# Patient Record
Sex: Female | Born: 1950
Health system: Southern US, Community
[De-identification: ages and names within clinical notes are randomized; demographics above are authoritative.]

## PROBLEM LIST (undated history)

## (undated) DIAGNOSIS — I6522 Occlusion and stenosis of left carotid artery: Secondary | ICD-10-CM

## (undated) DIAGNOSIS — J8410 Pulmonary fibrosis, unspecified: Secondary | ICD-10-CM

## (undated) DIAGNOSIS — I639 Cerebral infarction, unspecified: Secondary | ICD-10-CM

## (undated) DIAGNOSIS — D151 Benign neoplasm of heart: Secondary | ICD-10-CM

## (undated) DIAGNOSIS — IMO0001 Reserved for inherently not codable concepts without codable children: Secondary | ICD-10-CM

## (undated) DIAGNOSIS — I10 Essential (primary) hypertension: Secondary | ICD-10-CM

## (undated) DIAGNOSIS — E785 Hyperlipidemia, unspecified: Secondary | ICD-10-CM

## (undated) HISTORY — DX: Hyperlipidemia, unspecified: E78.5

## (undated) HISTORY — DX: Pulmonary fibrosis, unspecified: J84.10

## (undated) HISTORY — DX: Essential (primary) hypertension: I10

---

## 2005-07-22 ENCOUNTER — Other Ambulatory Visit: Admission: RE | Admit: 2005-07-22 | Discharge: 2005-07-22 | Payer: Self-pay | Admitting: Obstetrics and Gynecology

## 2005-08-13 ENCOUNTER — Ambulatory Visit (HOSPITAL_COMMUNITY): Admission: RE | Admit: 2005-08-13 | Discharge: 2005-08-13 | Payer: Self-pay | Admitting: Obstetrics and Gynecology

## 2006-09-03 ENCOUNTER — Encounter: Admission: RE | Admit: 2006-09-03 | Discharge: 2006-09-03 | Payer: Self-pay | Admitting: Obstetrics and Gynecology

## 2007-09-07 ENCOUNTER — Ambulatory Visit (HOSPITAL_COMMUNITY): Admission: RE | Admit: 2007-09-07 | Discharge: 2007-09-07 | Payer: Self-pay | Admitting: Obstetrics and Gynecology

## 2008-09-08 ENCOUNTER — Ambulatory Visit (HOSPITAL_COMMUNITY): Admission: RE | Admit: 2008-09-08 | Discharge: 2008-09-08 | Payer: Self-pay | Admitting: Obstetrics and Gynecology

## 2009-07-06 ENCOUNTER — Encounter: Admission: RE | Admit: 2009-07-06 | Discharge: 2009-07-06 | Payer: Self-pay | Admitting: Obstetrics and Gynecology

## 2009-09-13 ENCOUNTER — Encounter: Admission: RE | Admit: 2009-09-13 | Discharge: 2009-09-13 | Payer: Self-pay | Admitting: Obstetrics and Gynecology

## 2011-03-26 ENCOUNTER — Other Ambulatory Visit (HOSPITAL_COMMUNITY): Payer: Self-pay | Admitting: Obstetrics and Gynecology

## 2011-03-26 DIAGNOSIS — Z1231 Encounter for screening mammogram for malignant neoplasm of breast: Secondary | ICD-10-CM

## 2011-04-08 ENCOUNTER — Ambulatory Visit (HOSPITAL_COMMUNITY)
Admission: RE | Admit: 2011-04-08 | Discharge: 2011-04-08 | Disposition: A | Discharge status: Home / Self Care | Payer: BC Managed Care – PPO | Source: Ambulatory Visit | Attending: Obstetrics and Gynecology | Admitting: Obstetrics and Gynecology

## 2011-04-08 DIAGNOSIS — Z1231 Encounter for screening mammogram for malignant neoplasm of breast: Secondary | ICD-10-CM

## 2011-12-14 ENCOUNTER — Inpatient Hospital Stay (HOSPITAL_COMMUNITY)
Admission: EM | Admit: 2011-12-14 | Discharge: 2012-01-02 | DRG: 550 | Disposition: A | Discharge status: Rehabilitation Facility | Payer: BC Managed Care – PPO | Attending: Thoracic Surgery (Cardiothoracic Vascular Surgery) | Admitting: Thoracic Surgery (Cardiothoracic Vascular Surgery)

## 2011-12-14 ENCOUNTER — Inpatient Hospital Stay (HOSPITAL_COMMUNITY): Payer: BC Managed Care – PPO

## 2011-12-14 ENCOUNTER — Encounter (HOSPITAL_COMMUNITY): Payer: Self-pay | Admitting: Emergency Medicine

## 2011-12-14 ENCOUNTER — Emergency Department (HOSPITAL_COMMUNITY): Payer: BC Managed Care – PPO

## 2011-12-14 DIAGNOSIS — Z87891 Personal history of nicotine dependence: Secondary | ICD-10-CM

## 2011-12-14 DIAGNOSIS — I63239 Cerebral infarction due to unspecified occlusion or stenosis of unspecified carotid arteries: Secondary | ICD-10-CM | POA: Diagnosis present

## 2011-12-14 DIAGNOSIS — R4701 Aphasia: Secondary | ICD-10-CM | POA: Diagnosis present

## 2011-12-14 DIAGNOSIS — I959 Hypotension, unspecified: Secondary | ICD-10-CM | POA: Diagnosis not present

## 2011-12-14 DIAGNOSIS — G9340 Encephalopathy, unspecified: Secondary | ICD-10-CM

## 2011-12-14 DIAGNOSIS — D649 Anemia, unspecified: Secondary | ICD-10-CM | POA: Diagnosis not present

## 2011-12-14 DIAGNOSIS — E876 Hypokalemia: Secondary | ICD-10-CM

## 2011-12-14 DIAGNOSIS — Z8673 Personal history of transient ischemic attack (TIA), and cerebral infarction without residual deficits: Secondary | ICD-10-CM | POA: Diagnosis present

## 2011-12-14 DIAGNOSIS — E669 Obesity, unspecified: Secondary | ICD-10-CM | POA: Diagnosis present

## 2011-12-14 DIAGNOSIS — Z6836 Body mass index (BMI) 36.0-36.9, adult: Secondary | ICD-10-CM

## 2011-12-14 DIAGNOSIS — R000 Tachycardia, unspecified: Secondary | ICD-10-CM | POA: Diagnosis present

## 2011-12-14 DIAGNOSIS — Z79899 Other long term (current) drug therapy: Secondary | ICD-10-CM

## 2011-12-14 DIAGNOSIS — R4182 Altered mental status, unspecified: Secondary | ICD-10-CM

## 2011-12-14 DIAGNOSIS — I639 Cerebral infarction, unspecified: Secondary | ICD-10-CM

## 2011-12-14 DIAGNOSIS — I6522 Occlusion and stenosis of left carotid artery: Secondary | ICD-10-CM | POA: Diagnosis present

## 2011-12-14 DIAGNOSIS — D151 Benign neoplasm of heart: Secondary | ICD-10-CM

## 2011-12-14 DIAGNOSIS — I4891 Unspecified atrial fibrillation: Secondary | ICD-10-CM | POA: Diagnosis not present

## 2011-12-14 DIAGNOSIS — R471 Dysarthria and anarthria: Secondary | ICD-10-CM | POA: Diagnosis not present

## 2011-12-14 DIAGNOSIS — E162 Hypoglycemia, unspecified: Secondary | ICD-10-CM | POA: Diagnosis not present

## 2011-12-14 DIAGNOSIS — D72829 Elevated white blood cell count, unspecified: Secondary | ICD-10-CM

## 2011-12-14 DIAGNOSIS — I5189 Other ill-defined heart diseases: Secondary | ICD-10-CM

## 2011-12-14 DIAGNOSIS — J811 Chronic pulmonary edema: Secondary | ICD-10-CM | POA: Diagnosis present

## 2011-12-14 HISTORY — DX: Benign neoplasm of heart: D15.1

## 2011-12-14 HISTORY — DX: Occlusion and stenosis of left carotid artery: I65.22

## 2011-12-14 HISTORY — DX: Cerebral infarction, unspecified: I63.9

## 2011-12-14 LAB — DIFFERENTIAL
Basophils Absolute: 0 10*3/uL (ref 0.0–0.1)
Basophils Relative: 0 % (ref 0–1)
Eosinophils Absolute: 0.2 10*3/uL (ref 0.0–0.7)
Neutro Abs: 7.4 10*3/uL (ref 1.7–7.7)
Neutrophils Relative %: 62 % (ref 43–77)

## 2011-12-14 LAB — CBC
Hemoglobin: 11.3 g/dL — ABNORMAL LOW (ref 12.0–15.0)
MCH: 30.8 pg (ref 26.0–34.0)
MCH: 29.9 pg (ref 26.0–34.0)
MCHC: 32.8 g/dL (ref 30.0–36.0)
Platelets: 268 10*3/uL (ref 150–400)
RBC: 3.78 MIL/uL — ABNORMAL LOW (ref 3.87–5.11)
RDW: 14.1 % (ref 11.5–15.5)
WBC: 10.5 10*3/uL (ref 4.0–10.5)

## 2011-12-14 LAB — GLUCOSE, CAPILLARY
Glucose-Capillary: 169 mg/dL — ABNORMAL HIGH (ref 70–99)
Glucose-Capillary: 81 mg/dL (ref 70–99)
Glucose-Capillary: 63 mg/dL — ABNORMAL LOW (ref 70–99)
Glucose-Capillary: 56 mg/dL — ABNORMAL LOW (ref 70–99)
Glucose-Capillary: 72 mg/dL (ref 70–99)
Glucose-Capillary: 86 mg/dL (ref 70–99)
Glucose-Capillary: 97 mg/dL (ref 70–99)

## 2011-12-14 LAB — CREATININE, SERUM
Creatinine, Ser: 0.81 mg/dL (ref 0.50–1.10)
GFR calc Af Amer: >90 mL/min (ref >90)
GFR calc non Af Amer: 78 mL/min — ABNORMAL LOW (ref >90)

## 2011-12-14 LAB — CARDIAC PANEL(CRET KIN+CKTOT+MB+TROPI)
Relative Index: 2.5 (ref 0.0–2.5)
Relative Index: 3.1 — ABNORMAL HIGH (ref 0.0–2.5)
Total CK: 430 U/L — ABNORMAL HIGH (ref 7–177)
Troponin I: 1.34 ng/mL (ref <0.30)

## 2011-12-14 LAB — URINALYSIS, ROUTINE W REFLEX MICROSCOPIC
Glucose, UA: 250 mg/dL — AB
Hgb urine dipstick: NEGATIVE
Leukocytes, UA: NEGATIVE
Protein, ur: NEGATIVE mg/dL
pH: 7.5 (ref 5.0–8.0)

## 2011-12-14 LAB — COMPREHENSIVE METABOLIC PANEL
AST: 30 U/L (ref 0–37)
Albumin: 2.7 g/dL — ABNORMAL LOW (ref 3.5–5.2)
Alkaline Phosphatase: 52 U/L (ref 39–117)
BUN: 14 mg/dL (ref 6–23)
Chloride: 101 mEq/L (ref 96–112)
Creatinine, Ser: 0.78 mg/dL (ref 0.50–1.10)
Potassium: 3.1 mEq/L — ABNORMAL LOW (ref 3.5–5.1)
Total Bilirubin: 0.3 mg/dL (ref 0.3–1.2)
Total Protein: 5.9 g/dL — ABNORMAL LOW (ref 6.0–8.3)

## 2011-12-14 LAB — MRSA PCR SCREENING: MRSA by PCR: NEGATIVE

## 2011-12-14 MED ORDER — DEXTROSE 10 % IV SOLN
INTRAVENOUS | Status: DC
  Administered 2011-12-14 – 2011-12-19 (×4): via INTRAVENOUS

## 2011-12-14 MED ORDER — ACETAMINOPHEN 650 MG RE SUPP: 650.0000 mg | RECTAL | Status: DC | PRN

## 2011-12-14 MED ORDER — SENNOSIDES-DOCUSATE SODIUM 8.6-50 MG PO TABS
1.0000 | ORAL_TABLET | Freq: Every evening | ORAL | Status: DC | PRN
  Filled 2011-12-14: qty 1

## 2011-12-14 MED ORDER — ASPIRIN 325 MG PO TABS
325.0000 mg | ORAL_TABLET | Freq: Every day | ORAL | Status: DC
  Administered 2011-12-14 – 2011-12-20 (×4): 325 mg via ORAL
  Filled 2011-12-14 (×7): qty 1

## 2011-12-14 MED ORDER — SODIUM CHLORIDE 0.9 % IV SOLN: INTRAVENOUS | Status: DC

## 2011-12-14 MED ORDER — FUROSEMIDE 10 MG/ML IJ SOLN
40.0000 mg | Freq: Once | INTRAMUSCULAR | Status: DC
  Filled 2011-12-14: qty 4

## 2011-12-14 MED ORDER — DEXTROSE 50 % IV SOLN
25.0000 mL | Freq: Once | INTRAVENOUS | Status: AC
  Administered 2011-12-14: 25 mL via INTRAVENOUS

## 2011-12-14 MED ORDER — ASPIRIN 300 MG RE SUPP
300.0000 mg | Freq: Every day | RECTAL | Status: DC
  Administered 2011-12-17: 300 mg via RECTAL
  Filled 2011-12-14 (×7): qty 1

## 2011-12-14 MED ORDER — ACETAMINOPHEN 325 MG PO TABS: 650.0000 mg | ORAL_TABLET | ORAL | Status: DC | PRN

## 2011-12-14 MED ORDER — DEXTROSE 50 % IV SOLN
INTRAVENOUS | Status: AC
  Filled 2011-12-14: qty 50

## 2011-12-14 MED ORDER — DEXTROSE 50 % IV SOLN
1.0000 | Freq: Once | INTRAVENOUS | Status: AC
  Administered 2011-12-14: 50 mL via INTRAVENOUS

## 2011-12-14 MED ORDER — DEXTROSE 50 % IV SOLN
1.0000 | Freq: Once | INTRAVENOUS | Status: DC
  Filled 2011-12-14 (×2): qty 50

## 2011-12-14 MED ORDER — ONDANSETRON HCL 4 MG/2ML IJ SOLN: 4.0000 mg | Freq: Four times a day (QID) | INTRAMUSCULAR | Status: DC | PRN

## 2011-12-14 MED ORDER — ENOXAPARIN SODIUM 40 MG/0.4ML ~~LOC~~ SOLN
40.0000 mg | SUBCUTANEOUS | Status: DC
  Administered 2011-12-14 – 2011-12-22 (×9): 40 mg via SUBCUTANEOUS
  Filled 2011-12-14 (×11): qty 0.4

## 2011-12-14 MED ORDER — DEXTROSE 5 % IV SOLN
Freq: Once | INTRAVENOUS | Status: AC
  Administered 2011-12-14: 13:00:00 via INTRAVENOUS

## 2011-12-14 MED ORDER — POTASSIUM CHLORIDE CRYS ER 20 MEQ PO TBCR
40.0000 meq | EXTENDED_RELEASE_TABLET | ORAL | Status: AC
  Administered 2011-12-14 (×2): 40 meq via ORAL
  Filled 2011-12-14 (×2): qty 2

## 2011-12-14 NOTE — ED Provider Notes (Signed)
History     CSN: [id number]  Arrival date & time 12/14/11  1217   First MD Initiated Contact with Patient 12/14/11 1229      Chief Complaint  Patient presents with  . Hypoglycemia  . Altered Mental Status    (Consider location/radiation/quality/duration/timing/severity/associated sxs/prior treatment) HPI  [name] h/o hypoglycemia without DM pw slurred speech, incoherent. Per EMS pt last seen normal 3h45min prior to arrival. She was found down by her husband with incoherent speech and EMS was called. They called code stroke from the field then found her to have blood glucose of 34--> 54 after one amp of D50 was administered.  On arrival patient states she does not remember what happened. Does not complain of weakness at this time. No h/o CVA. States that she's had hypoglycemia in past but no h/o DM. She ate breakfast this morning. Currently taking prednisone and albuterol for URI per EMS/family  ED Notes, ED Provider Notes       Courtney Faulkner 12/14/2011 12:38      Pt's husband found pt around 1130 with incoherent and slurred speech and called EMS. EMS observed incoherent and slurred speech. CBG 34. EMS administered 1 amp D50. Repeat CBG 54. Pt given orange juice per Dr Hyman Hopes order/recommendation. CBG 166 after approx 6 oz orange juice. Pt brought straight to CT.         Courtney Chance, RN 12/14/2011 12:20      Code stroke cancelled per Dr. Hyman Hopes at 12:20pm.     No past medical history on file.  No past surgical history on file.  No family history on file.  History  Substance Use Topics  . Smoking status: Never Smoker   . Smokeless tobacco: Not on file  . Alcohol Use: No    OB History    Grav Para Term Preterm Abortions TAB SAB Ect Mult Living                  Review of Systems  All other systems reviewed and are negative.   except as noted HPI  Allergies  Review of patient's allergies indicates no known allergies.  Home Medications  No current outpatient  prescriptions on file.  BP 96/55  Pulse 78  Temp(Src) 97.4 F (36.3 C) (Oral)  Resp 18  SpO2 94%  Physical Exam  Nursing note and vitals reviewed. Constitutional: She is oriented to person, place, and time. She appears well-developed.  HENT:  Head: Atraumatic.  Mouth/Throat: Oropharynx is clear and moist.  Eyes: Conjunctivae and EOM are normal. Pupils are equal, round, and reactive to light.  Neck: Normal range of motion. Neck supple.  Cardiovascular: Normal rate, regular rhythm, normal heart sounds and intact distal pulses.   Pulmonary/Chest: Effort normal and breath sounds normal. No respiratory distress. She has no wheezes. She has no rales.  Abdominal: Soft. She exhibits no distension. There is no tenderness. There is no rebound and no guarding.  Musculoskeletal: Normal range of motion.  Neurological: She is alert and oriented to person, place, and time. No cranial nerve deficit. She exhibits normal muscle tone. Coordination normal.       Non tangential speech at times incoherent Strength 5/5 all extremities No pronator drift No facial droop   Skin: Skin is warm and dry. No rash noted.  Psychiatric: She has a normal mood and affect.    Date: 12/14/2011  Rate: 63  Rhythm: normal sinus rhythm  QRS Axis: normal  Intervals: PR shortened  ST/T Wave  abnormalities: nonspec t wave flattening  Conduction Disutrbances:none  Narrative Interpretation:   Old EKG Reviewed: none available   ED Course  Procedures (including critical care time)  Labs Reviewed  CBC - Abnormal; Notable for the following:    WBC 11.9 (*)    RBC 3.83 (*)    Hemoglobin 11.8 (*)    All other components within normal limits  DIFFERENTIAL - Abnormal; Notable for the following:    Monocytes Absolute 1.1 (*)    All other components within normal limits  COMPREHENSIVE METABOLIC PANEL - Abnormal; Notable for the following:    Potassium 3.1 (*)    Glucose, Bld 152 (*)    Total Protein 5.9 (*)     Albumin 2.7 (*)    GFR calc non Af Amer 90 (*)    All other components within normal limits  URINALYSIS, ROUTINE W REFLEX MICROSCOPIC - Abnormal; Notable for the following:    Glucose, UA 250 (*)    All other components within normal limits  GLUCOSE, CAPILLARY - Abnormal; Notable for the following:    Glucose-Capillary 169 (*)    All other components within normal limits  PRO B NATRIURETIC PEPTIDE - Abnormal; Notable for the following:    Pro B Natriuretic peptide (BNP) 188.5 (*)    All other components within normal limits  CARDIAC PANEL(CRET KIN+CKTOT+MB+TROPI) - Abnormal; Notable for the following:    CK, MB 11.6 (*)    All other components within normal limits  GLUCOSE, CAPILLARY - Abnormal; Notable for the following:    Glucose-Capillary 36 (*)    All other components within normal limits  GLUCOSE, CAPILLARY  GLUCOSE, CAPILLARY  GLUCOSE, CAPILLARY  CARDIAC PANEL(CRET KIN+CKTOT+MB+TROPI)  MAGNESIUM   Dg Chest 2 View  12/14/2011  *RADIOLOGY REPORT*  Clinical Data: Weakness.  Altered level of consciousness.  CHEST - 2 VIEW  Comparison: None.  Findings: There is cardiomegaly and bilateral airspace disease most consistent with pulmonary edema.  No pneumothorax.  IMPRESSION: Cardiomegaly and bilateral airspace disease most consistent with pulmonary edema.  Original Report Authenticated By: Bernadene Bell. Maricela Curet, M.D.   Ct Head Wo Contrast  12/14/2011  *RADIOLOGY REPORT*  Clinical Data:  Found unresponsive around 9:00 a.m.  Some speech difficulty with nonfocal nerve exam. Severely hypoglycemic on arrival.  CT HEAD WITHOUT CONTRAST  Technique:  Contiguous axial images were obtained from the base of the skull through the vertex without contrast  Comparison:  None.  Findings:  The brain has a normal appearance without evidence for hemorrhage, acute infarction, hydrocephalus, or mass lesion.  There is no extra axial fluid collection.  The skull and paranasal sinuses are normal.  IMPRESSION:  Normal CT of the head without contrast.  Original Report Authenticated By: Elsie Stain, M.D.   1. Hypoglycemia   2. Altered mental status   3. Pulmonary edema     MDM  Left weakness, incoherent, found to be hypoglycemic. Unk cause. Continues to have problems with intermittent incoherency despite glu wnl. D5 @100  ordered. CT head unremarkable. Check labs and reasess.  DW [facility] who will admit the patient. Trop and BNP add on as pulmonary edema on CXR as above. Per family no history. Non ischemic EKG  1500 Per [facility] pt no longer moving RUE. Glu checked at that time 36. 1AMP D50 ordered. Given pulmonary edema, patient switched from D5@100  to D10 @50 . Admitted to stepdown.       Forbes Cellar, MD 12/14/11 1549

## 2011-12-14 NOTE — ED Notes (Signed)
Encoded @ 11:54, called at 11:56, patient arrival @ 12:14, EDP at bedside @ 12:14. LSN 09:00 this morning at home per family. Arrival in CT @ 12:20. Code stroke cancelled per Dr. Hyman Hopes @12 :20.

## 2011-12-14 NOTE — ED Notes (Addendum)
Pt transported to floor on cardiac monitor.

## 2011-12-14 NOTE — H&P (Signed)
Courtney Faulkner MRN: [id number] DOB/AGE: 60/19/1952 60 y.o. Primary Care Physician:DEFAULT,PROVIDER, MD, MD Admit date: 12/14/2011 Chief Complaint: Altered mental status HPI:  Courtney Faulkner is a 61 year old African American female with no significant past medical history who was brought to the ED per EMS secondary to altered mental status. Patient has a dysarthric speech and is incoherent and a such history was obtained from patient's son and husband as well as ED physician's notes. It was noted that patient's husband heard patient around 8 AM vacuuming down status. At approximately 11:30 AM patient found his wife on the ground with incoherent and slurred speech. He called EMS and EMS got there. EMS checked her blood glucose level which was 34. Patient was given an amp of D50 with repeat CBGs of 54. Patient was subsequently brought to the ED in the ED patient was given some orange juice and her blood CBG went to 166. Patient subsequently had a head CT done which was negative. Initially a code stroke was called however it was canceled secondary to patient's hypoglycemic spells. Per family patient was recently been treated for upper respiratory symptoms with prednisone and albuterol. Patient and family deny any recent fevers, no chills, no chest pain, no diarrhea, no constipation, no dysuria, no generalized weakness. Patient's family does endorse some shortness of breath on exertion when patient climbs up stairs which has been ongoing for the past 2 weeks. On exam and during the interview patient has some dysarthric speech is alert to self only and has an incoherent speech as well.  No past medical history on file.  No past surgical history on file.  Prior to Admission medications   Not on File    Allergies: No Known Allergies  No family history on file.  Social History:  reports that she has never smoked. She does not have any smokeless tobacco history on file. She reports that she does not drink  alcohol or use illicit drugs.  ROS: All systems reviewed with the patient and was positive as per HPI otherwise all other systems are negative.  PHYSICAL EXAM: Blood pressure 96/55, pulse 78, temperature 97.4 F (36.3 C), temperature source Oral, resp. rate 18, SpO2 94.00%. General: Well-developed well-nourished alert to self only with slurred and dysarthric speech.  HEENT: Normocephalic atraumatic. Pupils equal round and reactive to light and accommodation. Extraocular movements intact. Oropharynx is clear, no lesions, no exudates. Neck is supple with no lymphadenopathy. No bruits, no goiter. Heart: Regular rate and rhythm, without murmurs, rubs, gallops. Lungs: Some bibasilar crackles otherwise clear. Abdomen: Soft, nontender, nondistended, positive bowel sounds. Extremities: No clubbing cyanosis. Trace bilateral lower extremity edema with positive pedal pulses. Neuro: Alert to self only. Patient with some dysarthria and incoherent speech. Patient with a slight right facial droopiness. 5 out of 5 bilateral lower extremity strength. 5 out of 5 left upper extremity strength. Bilateral grip strength is equal. Patient with a 3 to a 4/5 right upper extremity strength. Visual fields are intact. Sensation is intact. Gait not tested secondary to safety.    EKG: Short PR interval. Normal sinus rhythm. No ST-T wave abnormalities.  No results found for this or any previous visit (from the past 240 hour(s)).   Lab results:  [facility] 12/14/11 1236  NA 136  K 3.1*  CL 101  CO2 28  GLUCOSE 152*  BUN 14  CREATININE 0.78  CALCIUM 8.5  MG --  PHOS --    Basename 12/14/11 1236  AST 30  ALT 27  ALKPHOS 52  BILITOT 0.3  PROT 5.9*  ALBUMIN 2.7*   No results found for this basename: LIPASE:2,AMYLASE:2 in the last 72 hours  Basename 12/14/11 1236  WBC 11.9*  NEUTROABS 7.4  HGB 11.8*  HCT 36.0  MCV 94.0  PLT 280    Basename 12/14/11 1507  CKTOTAL PENDING  CKMB 11.6*  CKMBINDEX --    TROPONINI <0.30   No components found with this basename: POCBNP:3 No results found for this basename: DDIMER in the last 72 hours No results found for this basename: HGBA1C:2 in the last 72 hours No results found for this basename: CHOL:2,HDL:2,LDLCALC:2,TRIG:2,CHOLHDL:2,LDLDIRECT:2 in the last 72 hours No results found for this basename: TSH,T4TOTAL,FREET3,T3FREE,THYROIDAB in the last 72 hours No results found for this basename: VITAMINB12:2,FOLATE:2,FERRITIN:2,TIBC:2,IRON:2,RETICCTPCT:2 in the last 72 hours Imaging results:  Dg Chest 2 View  12/14/2011  *RADIOLOGY REPORT*  Clinical Data: Weakness.  Altered level of consciousness.  CHEST - 2 VIEW  Comparison: None.  Findings: There is cardiomegaly and bilateral airspace disease most consistent with pulmonary edema.  No pneumothorax.  IMPRESSION: Cardiomegaly and bilateral airspace disease most consistent with pulmonary edema.  Original Report Authenticated By: Bernadene Bell. Maricela Curet, M.D.   Ct Head Wo Contrast  12/14/2011  *RADIOLOGY REPORT*  Clinical Data:  Found unresponsive around 9:00 a.m.  Some speech difficulty with nonfocal nerve exam. Severely hypoglycemic on arrival.  CT HEAD WITHOUT CONTRAST  Technique:  Contiguous axial images were obtained from the base of the skull through the vertex without contrast  Comparison:  None.  Findings:  The brain has a normal appearance without evidence for hemorrhage, acute infarction, hydrocephalus, or mass lesion.  There is no extra axial fluid collection.  The skull and paranasal sinuses are normal.  IMPRESSION: Normal CT of the head without contrast.  Original Report Authenticated By: Elsie Stain, M.D.   Impression/Plan:  Principal Problem:  *Encephalopathy acute Active Problems:  Hypoglycemia  Pulmonary edema  Hypokalemia  Leukocytosis  Anemia   #1. Acute encephalopathy. Likely secondary to hypoglycemia versus acute CVA. During my exam patient had some dysarthria and slurred speech with  a slight right facial droop and right upper extremity weakness. Repeat CBC done was 36. When patient was seen in the foot the EMS and CBG was 34. Symptoms likely secondary to hypoglycemia however her CVA we'll need to be ruled out. Will check MRI MRA of the head and neck. Will check carotid Dopplers. We'll check a 2-D echo. We'll check a fasting lipid panel. Will check a hemoglobin A1c. We'll check a TSH. We'll place on aspirin. We'll place on D50 at 50 cc per hour. And follow. Will admit to the step down unit. Will consult with neurology for further evaluation and management.  #2 severe hypoglycemia Questionable etiology. Patient is currently afebrile. Patient has a slight leukocytosis however was on prednisone. Urinalysis is negative for UTI. Chest x-ray is negative for infiltrate. Will check blood cultures x2. Will check a C-peptide. Will check a sulfonylurea levels. Will check a TSH. We'll place on D50. Frequent CBGs. Follow.  #3 pulmonary edema Per chest x-ray. Chest x-ray is consistent with cardiomegaly. We'll cycle cardiac enzymes every 8 hours x3. Will check a 2-D echo. We'll check a pro BNP. We'll give a dose of IV Lasix to be taken with standing. Follow. If cardiac enzymes are abnormal or 2-D echo is abnormal we'll consult with cardiology for further evaluation and management.  #4 hypokalemia Check a magnesium level. Replete.  #5 leukocytosis Likely secondary to  prednisone being taken at home. Urinalysis is negative. Chest x-ray is negative for infiltrate. Will check blood cultures x2. Will follow.  #6 anemia No overt GI bleed. Per son patient had a recent colonoscopy done which was negative. Will check an anemia panel. Follow H&H.  #7 prophylaxis  Lovenox for DVT prophylaxis    Darus Hershman 12/14/2011, 3:51 PM

## 2011-12-14 NOTE — ED Notes (Signed)
Floor unable to take report at the time.

## 2011-12-14 NOTE — ED Notes (Signed)
Code stroke cancelled per Dr. Hyman Hopes at 12:20pm.

## 2011-12-14 NOTE — ED Notes (Signed)
Report given to Tobi Bastos, Charity fundraiser. Pt to have MRI before being transported to unit.

## 2011-12-14 NOTE — ED Notes (Addendum)
Pt's husband found pt around 1130 with incoherent and slurred speech and called EMS.  EMS observed incoherent and slurred speech. CBG 34. EMS administered 1 amp D50. Repeat CBG 54. CBG 166 at the time. Pt brought straight to CT. Pt is only following some commands. Pt is confused: states she is in [place] and the year is 2007.

## 2011-12-14 NOTE — ED Notes (Signed)
CBG 63  

## 2011-12-14 NOTE — ED Notes (Addendum)
Bed placement changed, flow manager contacted. Dr. Janee Morn at bedside to write admitting orders.

## 2011-12-14 NOTE — Consult Note (Addendum)
Referring Physician: Dr. Janee Morn    Chief Complaint: Slurred speech  HPI: Courtney Faulkner is an 61 y.o. female with dysarthria, aphasia and right sided weakness.   According to son at bedside, his father heard her vaccuming at ~08:30.  He came down stairs at 11:30 and found her confused and dysarthric.  EMS called and found BS 36.  After treatment, CBG 166, pt remains confused, and having difficulty following commands. Neuro consult called by Dr. Janee Morn for this reason. LSN: ~08:00 tPA Given:no; presented outside tPA window.  And last seen normal not certain. Interval: Baseline (04/20 1258) Level of Consciousness (1a. ): Not alert, but arousable by minor stimulation to obey, answer, or respond (04/20 1258) LOC Questions (1b. ): Answers neither question correctly (04/20 1258) LOC Commands (1c. ): Performs one task correctly (04/20 1258) Best Gaze (2. ): Normal (04/20 1258) Visual (3. ): No visual loss (04/20 1258) Facial Palsy (4. ): Normal symmetrical movements (04/20 1258) Motor Arm, Left (5a. ): No drift (04/20 1258) Motor Arm, Right (5b. ): No drift (04/20 1258) Motor Leg, Left (6a. ): No drift (04/20 1258) Motor Leg, Right (6b. ): No drift (04/20 1258) Limb Ataxia (7. ): Absent (04/20 1258) Sensory (8. ): Normal, no sensory loss (04/20 1258) Best Language (9. ): No aphasia (04/20 1258) Dysarthria (10. ): Mild-to-moderate dysarthria, patient slurs at least some words and, at worst, can be understood with some difficulty (04/20 1258) Inattention/Extinction: No Abnormality (04/20 1258) Total: 5  (04/20 1258)  Patient unable to provide reliable history.  Details obtained from son and chart. No past medical history on file.  No past surgical history on file.  No family history on file.  Social History:  Son reports that she does not smoke, drink alcohol or use illicit drugs.  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   Allergies: No Known Allergies  Medications:  Scheduled:   . dextrose   Intravenous Once  . dextrose  1 ampule Intravenous Once  . dextrose  1 ampule Intravenous Once  . dextrose      . potassium chloride  40 mEq Oral Q4H    ROS: Unable to obtain due to patient factors.  Physical Examination: Blood pressure 96/55, pulse 78, temperature 97.4 F (36.3 C), temperature source Oral, resp. rate 18, SpO2 94.00%. Telemetry:SR, 70  Neurologic Examination: Mental Status: Somnolent, occ requires sternal rub to arouse.  Maintaining own airway.  Speech dysarthric and often nonsensical.  Expressive and receptive aphasia.  Does not consistently follow commands.  Does not call son by right name.  Cranial Nerves: II- blinks to confrontation bilaterally.- may have right visual field cut. III/IV/VI-Extraocular movements grossly intact. Occ disconjugate ocular movements. Pupils reactive bilaterally. V/VII-Smile asymmetric with right facial droop VIII-grossly intact IX/X-normal gag XI-bilateral shoulder shrug XII-midline tongue extension Motor: 3/5 RUE with proximal distal weakness.  Minimal grip strength on right, normal left.   5/5 LUE, LLE and RLE with normal tone and bulk. Sensory: Pinprick and light touch intact , diminished on right hemibody, Deep Tendon Reflexes: 2+ and symmetric throughout Plantars: Downgoing bilaterally Cerebellar: unable to follow direction to test.  Basic Metabolic Panel:  Lab 12/14/11 4098  NA 136  K 3.1*  CL 101  CO2 28  GLUCOSE 152*  BUN 14  CREATININE 0.78  CALCIUM 8.5  MG --  PHOS --   Liver Function Tests:  Lab 12/14/11 1236  AST 30  ALT 27  ALKPHOS 52  BILITOT 0.3  PROT  5.9*  ALBUMIN 2.7*   CBC:  Lab 12/14/11 1236  WBC 11.9*  NEUTROABS 7.4  HGB 11.8*  HCT 36.0  MCV 94.0  PLT 280   CBG:  Lab 12/14/11 1525 12/14/11 1359 12/14/11 1248 12/14/11 1239 12/14/11 1222  GLUCAP 36* 94 80 81 169*   Urinalysis:  Lab 12/14/11 1255  COLORURINE YELLOW  LABSPEC 1.015  PHURINE 7.5    GLUCOSEU 250*  HGBUR NEGATIVE  BILIRUBINUR NEGATIVE  KETONESUR NEGATIVE  PROTEINUR NEGATIVE  UROBILINOGEN 0.2  NITRITE NEGATIVE  LEUKOCYTESUR NEGATIVE    12/14/2011 CHEST - 2 VIEW    Findings: There is cardiomegaly and bilateral airspace disease most consistent with pulmonary edema.  No pneumothorax.  IMPRESSION: Cardiomegaly and bilateral airspace disease most consistent with pulmonary edema.   THOMAS L. D'ALESSIO, M.D.   12/14/2011    CT HEAD WITHOUT CONTRAST   Findings:  The brain has a normal appearance without evidence for hemorrhage, acute infarction, hydrocephalus, or mass lesion.  There is no extra axial fluid collection.  The skull and paranasal sinuses are normal.  IMPRESSION: Normal CT of the head without contrast.   Elsie Stain, M.D.    Assessment: 61 y.o. aa female found with slurred speech and confusion at 1100, last seen normal < 0830.  Also profoundly hypoglycemic. Head Ct negative for bleed, mass or acute process.  MRI ordered.  Patient has right hemiparesis and global aphasia which is most consistent with left brain stroke.   Stroke Risk Factors - none.  Plan: 1. HgbA1c, fasting lipid panel 2. MRI, MRA  of the brain without contrast 3. PT consult, OT consult, Speech consult 4. Echocardiogram 5. Carotid dopplers 6. Prophylactic therapy- ASA 81 mg daily. 7. Risk factor modification 8. Telemetry monitoring 9. NPO until passes nursing swallow eval 10. HOB at 30 degrees 11. KEEP MAP b/w 120 to 130 12. IV fluids  Patient will be seen by Dr. Lyman Speller. Thank you for consult.  Will follow along.  Marya Fossa PA-C [facility] [phone] 12/14/2011, 3:45 PM

## 2011-12-14 NOTE — ED Notes (Signed)
CBG of 36 and told Dr. Janee Morn

## 2011-12-15 ENCOUNTER — Inpatient Hospital Stay (HOSPITAL_COMMUNITY): Payer: BC Managed Care – PPO

## 2011-12-15 ENCOUNTER — Encounter (HOSPITAL_COMMUNITY): Payer: Self-pay | Admitting: *Deleted

## 2011-12-15 DIAGNOSIS — R4182 Altered mental status, unspecified: Secondary | ICD-10-CM

## 2011-12-15 DIAGNOSIS — Z8673 Personal history of transient ischemic attack (TIA), and cerebral infarction without residual deficits: Secondary | ICD-10-CM | POA: Diagnosis present

## 2011-12-15 DIAGNOSIS — I6522 Occlusion and stenosis of left carotid artery: Secondary | ICD-10-CM

## 2011-12-15 DIAGNOSIS — E162 Hypoglycemia, unspecified: Secondary | ICD-10-CM

## 2011-12-15 DIAGNOSIS — I639 Cerebral infarction, unspecified: Secondary | ICD-10-CM

## 2011-12-15 HISTORY — DX: Occlusion and stenosis of left carotid artery: I65.22

## 2011-12-15 HISTORY — DX: Cerebral infarction, unspecified: I63.9

## 2011-12-15 LAB — BASIC METABOLIC PANEL
BUN: 7 mg/dL (ref 6–23)
CO2: 25 mEq/L (ref 19–32)
Chloride: 101 mEq/L (ref 96–112)
Chloride: 102 mEq/L (ref 96–112)
GFR calc Af Amer: >90 mL/min (ref >90)
Glucose, Bld: 125 mg/dL — ABNORMAL HIGH (ref 70–99)
Glucose, Bld: 113 mg/dL — ABNORMAL HIGH (ref 70–99)
Potassium: 4 mEq/L (ref 3.5–5.1)
Potassium: 3.8 mEq/L (ref 3.5–5.1)
Sodium: 135 mEq/L (ref 135–145)

## 2011-12-15 LAB — GLUCOSE, CAPILLARY
Glucose-Capillary: 101 mg/dL — ABNORMAL HIGH (ref 70–99)
Glucose-Capillary: 82 mg/dL (ref 70–99)
Glucose-Capillary: 88 mg/dL (ref 70–99)
Glucose-Capillary: 62 mg/dL — ABNORMAL LOW (ref 70–99)
Glucose-Capillary: 34 mg/dL — CL (ref 70–99)
Glucose-Capillary: 98 mg/dL (ref 70–99)
Glucose-Capillary: 68 mg/dL — ABNORMAL LOW (ref 70–99)
Glucose-Capillary: 54 mg/dL — ABNORMAL LOW (ref 70–99)
Glucose-Capillary: 83 mg/dL (ref 70–99)
Glucose-Capillary: 84 mg/dL (ref 70–99)

## 2011-12-15 LAB — DIFFERENTIAL
Eosinophils Absolute: 0.1 10*3/uL (ref 0.0–0.7)
Lymphs Abs: 1.7 10*3/uL (ref 0.7–4.0)
Monocytes Absolute: 0.6 10*3/uL (ref 0.1–1.0)
Monocytes Relative: 9 % (ref 3–12)
Neutrophils Relative %: 69 % (ref 43–77)

## 2011-12-15 LAB — IRON AND TIBC
Iron: 39 ug/dL — ABNORMAL LOW (ref 42–135)
Saturation Ratios: 17 % — ABNORMAL LOW (ref 20–55)
TIBC: 231 ug/dL — ABNORMAL LOW (ref 250–470)

## 2011-12-15 LAB — HEMOGLOBIN A1C: Hgb A1c MFr Bld: 5.1 % (ref <5.7)

## 2011-12-15 LAB — COMPREHENSIVE METABOLIC PANEL
ALT: 25 U/L (ref 0–35)
AST: 33 U/L (ref 0–37)
Albumin: 2.7 g/dL — ABNORMAL LOW (ref 3.5–5.2)
Alkaline Phosphatase: 54 U/L (ref 39–117)
Calcium: 9.1 mg/dL (ref 8.4–10.5)
GFR calc Af Amer: >90 mL/min (ref >90)
Potassium: 4.1 mEq/L (ref 3.5–5.1)
Sodium: 138 mEq/L (ref 135–145)
Total Protein: 6.3 g/dL (ref 6.0–8.3)

## 2011-12-15 LAB — CBC
HCT: 38 % (ref 36.0–46.0)
Hemoglobin: 12.2 g/dL (ref 12.0–15.0)
MCH: 30.7 pg (ref 26.0–34.0)
MCHC: 32.1 g/dL (ref 30.0–36.0)
MCV: 95.7 fL (ref 78.0–100.0)
RDW: 14.2 % (ref 11.5–15.5)

## 2011-12-15 LAB — LIPID PANEL
Cholesterol: 143 mg/dL (ref 0–200)
Triglycerides: 103 mg/dL (ref <150)
VLDL: 21 mg/dL (ref 0–40)

## 2011-12-15 LAB — CARDIAC PANEL(CRET KIN+CKTOT+MB+TROPI)
CK, MB: 12.8 ng/mL (ref 0.3–4.0)
CK, MB: 11 ng/mL (ref 0.3–4.0)
Relative Index: 3 — ABNORMAL HIGH (ref 0.0–2.5)
Total CK: 429 U/L — ABNORMAL HIGH (ref 7–177)
Troponin I: 1.41 ng/mL (ref <0.30)

## 2011-12-15 MED ORDER — IOHEXOL 350 MG/ML SOLN
50.0000 mL | Freq: Once | INTRAVENOUS | Status: AC | PRN
  Administered 2011-12-15: 50 mL via INTRAVENOUS

## 2011-12-15 MED ORDER — BIOTENE DRY MOUTH MT LIQD
15.0000 mL | Freq: Two times a day (BID) | OROMUCOSAL | Status: DC
  Administered 2011-12-16 – 2011-12-23 (×13): 15 mL via OROMUCOSAL

## 2011-12-15 MED ORDER — DEXTROSE 50 % IV SOLN
25.0000 mL | Freq: Once | INTRAVENOUS | Status: AC | PRN
  Filled 2011-12-15: qty 50

## 2011-12-15 MED ORDER — DEXTROSE 50 % IV SOLN: 50.0000 mL | Freq: Once | INTRAVENOUS | Status: DC | PRN

## 2011-12-15 MED ORDER — DEXTROSE 50 % IV SOLN
1.0000 | INTRAVENOUS | Status: DC | PRN
  Administered 2011-12-15 (×2): 25 mL via INTRAVENOUS

## 2011-12-15 MED ORDER — DEXTROSE 50 % IV SOLN
25.0000 mL | Freq: Once | INTRAVENOUS | Status: AC | PRN
  Administered 2011-12-15: 25 mL via INTRAVENOUS

## 2011-12-15 MED ORDER — SODIUM CHLORIDE 0.9 % IV SOLN
INTRAVENOUS | Status: DC
  Administered 2011-12-15 – 2011-12-16 (×3): via INTRAVENOUS

## 2011-12-15 NOTE — Plan of Care (Signed)
Problem: Phase II Progression Outcomes Goal: Tolerates increased mobility Outcome: Not Met (add Reason) Pt with neurological decline when up in room with PT.

## 2011-12-15 NOTE — Progress Notes (Signed)
History: Courtney Faulkner is an 61 y.o. female with dysarthria, aphasia and right sided weakness. According to son at bedside, his father heard her vaccuming at ~08:30. He came down stairs at 11:30 and found her confused and dysarthric. EMS called and found BS 36. After treatment, CBG 166, pt remains confused, and having difficulty following commands. Neuro consult called by Dr. Janee Morn for this reason.   LSN: ~08:00  tPA Given:no; presented outside tPA window. And last seen normal not certain.  Interval: Baseline (04/20 1258)  Level of Consciousness (1a. ): Not alert, but arousable by minor stimulation to obey, answer, or respond (04/20 1258)  LOC Questions (1b. ): Answers neither question correctly (04/20 1258)  LOC Commands (1c. ): Performs one task correctly (04/20 1258)  Best Gaze (2. ): Normal (04/20 1258)  Visual (3. ): No visual loss (04/20 1258)  Facial Palsy (4. ): Normal symmetrical movements (04/20 1258)  Motor Arm, Left (5a. ): No drift (04/20 1258)  Motor Arm, Right (5b. ): No drift (04/20 1258)  Motor Leg, Left (6a. ): No drift (04/20 1258)  Motor Leg, Right (6b. ): No drift (04/20 1258)  Limb Ataxia (7. ): Absent (04/20 1258)  Sensory (8. ): Normal, no sensory loss (04/20 1258)  Best Language (9. ): No aphasia (04/20 1258)  Dysarthria (10. ): Mild-to-moderate dysarthria, patient slurs at least some words and, at worst, can be understood with some difficulty (04/20 1258)  Inattention/Extinction: No Abnormality (04/20 1258)  Total: 5 (04/20 1258)  Subjective: I am ok.  Husband at bedside.    Objective: BP 109/85  Pulse 67  Temp(Src) 98.4 F (36.9 C) (Oral)  Resp 27  Ht 5\' 3"  (1.6 m)  Wt 92.987 kg (205 lb)  BMI 36.31 kg/m2  SpO2 99% Telemetry:  CBGs  Basename 12/15/11 0924 12/15/11 0919 12/15/11 0811 12/15/11 0622 12/15/11 0421 12/15/11 0226 12/15/11 0015 12/14/11 2214 12/14/11 2006 12/14/11 1807  GLUCAP 34* 28* 62* 88 82 101* 111* 97 86 72    Diet: regular,  thin   Activity: up with assistance  DVT Prophylaxis: lovenox   Medications: Scheduled:   . aspirin  300 mg Rectal Daily   Or  . aspirin  325 mg Oral Daily  . dextrose   Intravenous Once  . dextrose  25 mL Intravenous Once  . dextrose  1 ampule Intravenous Once  . dextrose  1 ampule Intravenous Once  . dextrose      . enoxaparin  40 mg Subcutaneous Q24H  . potassium chloride  40 mEq Oral Q4H  . DISCONTD: furosemide  40 mg Intravenous Once    Neurologic Exam: Mental Status: Awake and alert.  Follows multi-step commands.  Able to name and repeat correctly. Minimal dysarthria, no evidence of aphasia. Cranial Nerves:  II- visual fields full and intact. III/IV/VI-Extraocular movements grossly intact.  Pupils reactive bilaterally.  V/VII-Smile asymmetric with minimal right facial droop  VIII-grossly intact  IX/X-normal gag  XI-bilateral shoulder shrug  XII-midline tongue extension  Motor: -5/5 RUE. Near equal grips.  5/5 LUE, LLE and RLE with normal tone and bulk.  Sensory:  light touch and discrimination intact  Deep Tendon Reflexes: 2+ and symmetric throughout  Plantars: Downgoing bilaterally  Cerebellar: finger to nose intact.    Lab Results: Basic Metabolic Panel:  Lab 12/14/11 5621 12/14/11 1456 12/14/11 1236  NA -- -- 136  K -- -- 3.1*  CL -- -- 101  CO2 -- -- 28  GLUCOSE -- -- 152*  BUN -- -- 14  CREATININE 0.81 -- 0.78  CALCIUM -- -- 8.5  MG -- 1.9 --  PHOS -- -- --   Liver Function Tests:  Lab 12/14/11 1236  AST 30  ALT 27  ALKPHOS 52  BILITOT 0.3  PROT 5.9*  ALBUMIN 2.7*   CBC:  Lab 12/15/11 0900 12/14/11 1742 12/14/11 1236  WBC 7.6 10.5 --  NEUTROABS 5.2 -- 7.4  HGB 12.2 11.3* --  HCT 38.0 35.7* --  MCV 95.7 94.4 --  PLT 306 268 --   Cardiac Enzymes:  Lab 12/14/11 2227 12/14/11 1507  CKTOTAL 430* 461*  CKMB 13.2* 11.6*  CKMBINDEX -- --  TROPONINI 1.34* <0.30   BNP:  Lab 12/14/11 1456  PROBNP 188.5*   CBG:  Lab 12/15/11  0924 12/15/11 0919 12/15/11 0811 12/15/11 0622 12/15/11 0421 12/15/11 0226  GLUCAP 34* 28* 62* 88 82 101*   Thyroid Function Tests:  Lab 12/14/11 1742  TSH 1.840  T4TOTAL --  FREET4 --  T3FREE --  THYROIDAB --   Anemia Panel:  Lab 12/15/11 0900  VITAMINB12 --  FOLATE --  FERRITIN --  TIBC --  IRON --  RETICCTPCT 2.9   Urinalysis:  Lab 12/14/11 1255  COLORURINE YELLOW  LABSPEC 1.015  PHURINE 7.5  GLUCOSEU 250*  HGBUR NEGATIVE  BILIRUBINUR NEGATIVE  KETONESUR NEGATIVE  PROTEINUR NEGATIVE  UROBILINOGEN 0.2  NITRITE NEGATIVE  LEUKOCYTESUR NEGATIVE    Study Results:  12/14/2011   CHEST - 2 VIEW  Comparison: None.  Findings: There is cardiomegaly and bilateral airspace disease most consistent with pulmonary edema.  No pneumothorax.  IMPRESSION: Cardiomegaly and bilateral airspace disease most consistent with pulmonary edema. THOMAS L. D'ALESSIO, M.D.   12/14/2011  CT HEAD WITHOUT CONTRAST  Technique:  Contiguous axial images were obtained from the base of the skull through the vertex without contrast  Comparison:  None.  Findings:  The brain has a normal appearance without evidence for hemorrhage, acute infarction, hydrocephalus, or mass lesion.  There is no extra axial fluid collection.  The skull and paranasal sinuses are normal.  IMPRESSION: Normal CT of the head without contrast.   Elsie Stain, M.D.   12/14/2011   MRI HEAD WITHOUT CONTRAST  Findings:   There is a moderately large acute left MCA territory infarction affecting the temporal lobe, basal ganglia, anterior frontal cortex, posterior frontal cortex, and frontal opercular subcortical white matter.  The posterior limb internal capsule and insular cortex are also affected.  There is no visible hemorrhage. Prominent perivascular space left anterior commissure region. Minimal subcortical and periventricular white matter signal could represent early chronic microvascular ischemic change.  The flow voids in the right  internal carotid artery, basilar artery, in both vertebral arteries are preserved, but there is absence of flow related enhancement in the left internal carotid artery in its visible cervical, skull base, and supraclinoid segments.  There may be diminished flow related enhancement in the distal M1 segment left middle cerebral artery, and there is slow flow in the M2/M3 segments of the left MCA. This likely represents an acute occlusion, based on the relatively prolonged intravascular LICA T2 signal.  Normal pituitary.  Slight cerebellar tonsillar ectopia of 5 mm without frank Chiari I malformation.  Negative orbits.  No acute sinus or mastoid disease.  Compared with prior CT, the infarct is not visible.  IMPRESSION: Moderate sized acute left hemisphere infarct which appears to be secondary to an acute left internal carotid artery occlusion. No visible  hemorrhage or midline shift.  Emboli to the left middle cerebral artery and its branches is not excluded.Elsie Stain, M.D.   12/15/11 Carotid Dopplers completed. Preliminary report: Right: no ICA stenosis. Left: ICA appear occluded. Bilateral vertebral artery flow is antegrade  Assessment/Plan: 61 y.o. aa female found with slurred speech and confusion at 1100, last seen normal < 0830. Also profoundly hypoglycemic. Patient's right hemiparesis and global aphasia is markedly improved this am.  MRI head revealed moderate sized acute left hemisphere infarct which appears to be secondary to an acute left internal carotid artery occlusion. No visible hemorrhage or midline shift.    CBG's continue to show pattern of profound hypoglycemia.   Hypokalemia- 3.1  Stroke Risk Factors - none.   Plan: 1. CTA head and neck 2. Consider TEE  3. Continue ASA 325 mg QD 4. 2 D Echo 5. ST/PT/OT eval and treat 6. Risk factor modification 7. Telemetry 8. Keep in step down today; consider transfer to floor tomorrow. 9. Lipid and A1C 10. Hypoglycemia and hypokalemia to  be managed by primary team.  Patient seen and examined by Dr. Terrace Arabia who recommends above.   LOS: 1 day   Marya Fossa PA-C [facility] [phone] 12/15/2011  10:18 AM

## 2011-12-15 NOTE — Progress Notes (Signed)
Called by RN to check on patient with neuro changes.  This am patient had been able to communicate appropriately and use her right side.  As per PT patient did not eat her breakfast and patient told PT that she was having difficulty swallowing so she did not eat.  Upon arrival to patients room PT getting patient back to bed.  AS per PT, PT got her out of bed and when they got patient standing and asked her to read the board she could not do it, patient unable to read words and numbers, also noted garbled speech.  MD at bedside.  Spoke to Dr. Lyman Speller.  Dr. Terrace Arabia paged and notified.  Bedside RN spoke to Dr. Terrace Arabia.  Patient for a stat CT Head.  Will continue to monitor and follow  patient

## 2011-12-15 NOTE — Progress Notes (Addendum)
VASCULAR LAB PRELIMINARY  PRELIMINARY  PRELIMINARY  PRELIMINARY  Carotid Dopplers completed.    Preliminary report:  Right: no ICA stenosis.  Left: ICA appears occluded.  Bilateral vertebral artery flow is antegrade.  Sherren Kerns [place], 12/15/2011, 10:05 AM

## 2011-12-15 NOTE — Progress Notes (Signed)
CBG: 34 Treatment: 25 ml of D50 Symptoms:none; pt stated she feels fine Follow-up CBG: Time 1015   CBG Result  117  Possible Reasons for Event: pt not eating  Comments/MD notified: the pt does not want to eat,she said she does not have the appetite.  Dr. Maretta Bees   Made aware [facility]

## 2011-12-15 NOTE — Consult Note (Signed)
Name: TACORI KVAMME MRN: [id number] DOB: [birth year]    LOS: 1 Requesting MD:  Sharon Seller Regarding: [place]/hypoglycemia   CONSULT NOTE  History of Present Illness: 61 yo AAF , sociology teacher at [employer], non smoker/drinker/no PMH , who presented 4/20 with garbled speech, hypoglycemia.  MRI revealed moderate lt hemispheric infarct. 4/21 she again had hypogylcemia and change in speech pattern. She was moved to NSICU and PCCM asked to assume her care. She is being followed by stroke team.   Lines / Drains:   Cultures: 4.20 bc x 2>>  Antibiotics: none  Tests / Events: 4/21 tx to nsicu  Subjective: NAD Past Medical History  Diagnosis Date  . Recurrent upper respiratory infection (URI)   . No pertinent past medical history    History reviewed. No pertinent past surgical history. Prior to Admission medications   Medication Sig Start Date End Date Taking? Authorizing Provider  albuterol (PROVENTIL HFA;VENTOLIN HFA) 108 (90 BASE) MCG/ACT inhaler Inhale 2 puffs into the lungs every 6 (six) hours as needed. For wheezing   Yes Historical Provider, MD  predniSONE (DELTASONE) 10 MG tablet Take 10 mg by mouth daily. 12 day pack filled on 12-06-11   Yes Historical Provider, MD   Allergies No Known Allergies  Family History History reviewed. No pertinent family history.  Social History  reports that she quit smoking about 9 years ago. She has never used smokeless tobacco. She reports that she does not drink alcohol or use illicit drugs.  Review Of Systems  11 points review of systems is negative with an exception of listed in HPI.  Vital Signs: Temp:  [98 F (36.7 C)-98.6 F (37 C)] 98.4 F (36.9 C) (04/21 0800) Pulse Rate:  [56-107] 56  (04/21 1200) Resp:  [18-32] 29  (04/21 1200) BP: (93-123)/(54-85) 112/54 mmHg (04/21  1200) SpO2:  [87 %-100 %] 100 % (04/21 1200) Weight:  [205 lb (92.987 kg)] 205 lb (92.987 kg) (04/20 1746) I/O last 3 completed shifts: In: 890 [P.O.:240; I.V.:650] Out: 1110 [Urine:1110]  Physical Examination: General:  WNWD NAD Neuro:  No appeent defects HEENT:  No jvd Cardiovascular:  hsr rrr Lungs:  cta Abdomen:  +bs Musculoskeletal:  intact Skin:  warm  Ventilator settings:    Labs and Imaging:  Dg Chest 2 View  12/14/2011  *RADIOLOGY REPORT*  Clinical Data: Weakness.  Altered level of consciousness.  CHEST - 2 VIEW  Comparison: None.  Findings: There is cardiomegaly and bilateral airspace disease most consistent with pulmonary edema.  No pneumothorax.  IMPRESSION: Cardiomegaly and bilateral airspace disease most consistent with pulmonary edema.  Original Report Authenticated By: Bernadene Bell. Maricela Curet, M.D.   Ct Head Wo Contrast  12/15/2011  *RADIOLOGY REPORT*  Clinical Data: Worsening symptoms.  Evaluate for hemorrhagic transformation.  CT HEAD WITHOUT CONTRAST  Technique:  Contiguous axial images were obtained from the base of  the skull through the vertex without contrast.  Comparison: CT head 12/14/2011.  MR head 12/14/2011.  Findings: Interval change in the appearance of the CT scan compared with yesterday, now with developing cytotoxic edema representing acute infarction correlating with the observed pattern of abnormal restricted diffusion on MR.  This cytotoxic edema involves the primarily the basal ganglia structures on the left.  No evidence for hemorrhagic transformation. Equivocal 1 mm left to right shift. There is preservation of the perimesencephalic and suprasellar cistern CSF spaces, with no evidence for uncal herniation. Symmetric sylvian fissures noted.  No hydrocephalus.  No extra- axial fluid collection or mass lesion.  IMPRESSION: Developing cytotoxic edema correlates with the observed pattern of restricted diffusion on MR, representing acute infarction. This primarily  involves the left basal ganglia.  Smaller areas of cortical and subcortical infarction seen on MRI in the left hemisphere are too small to be easily visible on CT.   No incipient uncal or transtentorial herniation, although minimal 1 mm left to right shift may be present.  No hemorrhagic transformation.  Original Report Authenticated By: Elsie Stain, M.D.   Ct Head Wo Contrast  12/14/2011  *RADIOLOGY REPORT*  Clinical Data:  Found unresponsive around 9:00 a.m.  Some speech difficulty with nonfocal nerve exam. Severely hypoglycemic on arrival.  CT HEAD WITHOUT CONTRAST  Technique:  Contiguous axial images were obtained from the base of the skull through the vertex without contrast  Comparison:  None.  Findings:  The brain has a normal appearance without evidence for hemorrhage, acute infarction, hydrocephalus, or mass lesion.  There is no extra axial fluid collection.  The skull and paranasal sinuses are normal.  IMPRESSION: Normal CT of the head without contrast.  Original Report Authenticated By: Elsie Stain, M.D.   Mr Brain Wo Contrast  12/14/2011  *RADIOLOGY REPORT*  Clinical Data:  Hypoglycemia, right-sided hemipareses.  Aphasia.  MRI HEAD WITHOUT CONTRAST  Technique:  Multiplanar, multiecho pulse sequences of the brain and surrounding structures were obtained without intravenous contrast.  Comparison:  CT head earlier in the day.  Findings:   There is a moderately large acute left MCA territory infarction affecting the temporal lobe, basal ganglia, anterior frontal cortex, posterior frontal cortex, and frontal opercular subcortical white matter.  The posterior limb internal capsule and insular cortex are also affected.  There is no visible hemorrhage. Prominent perivascular space left anterior commissure region. Minimal subcortical and periventricular white matter signal could represent early chronic microvascular ischemic change.  The flow voids in the right internal carotid artery, basilar artery,  in both vertebral arteries are preserved, but there is absence of flow related enhancement in the left internal carotid artery in its visible cervical, skull base, and supraclinoid segments.  There may be diminished flow related enhancement in the distal M1 segment left middle cerebral artery, and there is slow flow in the M2/M3 segments of the left MCA. This likely represents an acute occlusion, based on the relatively prolonged intravascular LICA T2 signal.  Normal pituitary.  Slight cerebellar tonsillar ectopia of 5 mm without frank Chiari I malformation.  Negative orbits.  No acute sinus or mastoid disease.  Compared with prior CT, the infarct is not visible.  IMPRESSION: Moderate sized acute left hemisphere infarct which appears to be secondary to an acute left internal carotid artery occlusion. No visible hemorrhage or midline shift.  Emboli to the left middle cerebral artery and its branches is not excluded. See comments above.  Discussed with Dr. Janee Morn at  time of dictation.  Original Report Authenticated By: Elsie Stain, M.D.    Lab 12/15/11 0900 12/14/11 1742 12/14/11 1236  NA 138 -- 136  K 4.1 -- 3.1*  CL 103 -- 101  CO2 25 -- 28  BUN 9 -- 14  CREATININE 0.70 0.81 0.78  GLUCOSE 104* -- 152*    Lab 12/15/11 0900 12/14/11 1742 12/14/11 1236  HGB 12.2 11.3* 11.8*  HCT 38.0 35.7* 36.0  WBC 7.6 10.5 11.9*  PLT 306 268 280   ABG No results found for this basename: phart, pco2, pco2art, po2, po2art, hco3, tco2, acidbasedef, o2sat    Assessment and Plan: Recurrent hypoglycemia - ICU admit -iv of d5ns at 50] -cbg q2h x 12 h the q 6 h -D50 as needed  New lt stroke -per stroke team  Best practices / Disposition: -->ICU status under PCCM -->full code -->Heparin for DVT Px -->Protonix for GI Px -->diet -->family updated at bedside  [facility] ACNP Adolph Pollack PCCM Pager [phone] till 3 pm If no answer page [phone] 12/15/2011, 2:56 PM   Seen in consultation with  resident MD or ACNP above.  Pt examined and database reviewed. I agree with above findings, assessment and plan as reflected in the note above.   Billy Fischer, MD;  PCCM service; Mobile [phone]

## 2011-12-15 NOTE — Evaluation (Signed)
Physical Therapy Evaluation and Discharge  See below for details re: decline in status during PT evaluation. Spoke with Dr. Sharon Seller re: will need clarification orders when OK to re-evaluate patient. Please reorder when appropriate.  Patient Details Name: Courtney Faulkner MRN: [id number] DOB: [birth year] Today's Date: 12/15/2011 Time: 4098-1191 PT Time Calculation (min): 42 min  PT Assessment / Plan / Recommendation Clinical Impression  Pt was admitted with Rt sided-weakness and dysarthria on 4/20 and found to have a moderate sized acute left hemisphere infarct which appears to be secondary to an acute left internal carotid artery occlusion. Symptoms were resolving per neurology notes and appeared nearly resolved at beginning of session. Pt now with further neuro decline and will need to await further test results and MD clarification to continue with PT.     PT Assessment   (Will need PT re-evaluation when MD clears for activity. )    Follow Up Recommendations  Other (comment) (TBA)    Equipment Recommendations  Other (comment) (TBA)    Frequency      Precautions / Restrictions Precautions Precautions: Fall   Pertinent Vitals/Pain See vital signs record. Pt with episode of hypotension during session.      Mobility  Bed Mobility Bed Mobility: Supine to Sit;Sitting - Scoot to [place] of Bed;Sit to Sidelying Left Supine to Sit: 4: Min assist;HOB flat Sitting - Scoot to [place] of Bed: 5: Supervision Sit to Sidelying Left: 4: Min assist;HOB flat Details for Bed Mobility Assistance: pt uses momentum to move supine to sit and unable x2 independently, required assist to complete; scoot to EOB with supervision for safety due to transient dizziness; after medical decline, required min assist to bring legs up onto bed Transfers Transfers: Sit to Stand;Stand to Sit;Stand Pivot Transfers Sit to Stand: 4: Min assist;With upper extremity assist;From bed Stand to Sit: 4: Min assist;With upper  extremity assist;With armrests;To chair/3-in-1 Stand Pivot Transfers: 3: Mod assist Details for Transfer Assistance: steady assist with sit to stand and stand to sit initially; after decline in status, stand-pivot from recliner to bed moving to pt's left with mod assist due to difficulty coordinating stepping and shuffling her feet. Ambulation/Gait Ambulation/Gait Assistance: 4: Min assist Ambulation Distance (Feet): 10 Feet Assistive device: 1 person hand held assist;Other (Comment) (2nd person for lines/IV) Ambulation/Gait Assistance Details: Ambulating with min assist for safety; as walking towards end of bed, asked pt to stop and read a sign on wall; noted inaccurate #s and words and asked pt to attempt another sign with same result; assisted pt to sit in recliner and noted incr lethargy, incr garbled speech, inablity to state her name; RN called to room who contacted MD (Dr. Sharon Seller was on unit) and assessed pt's CBG=98. Dr. Sharon Seller in to assess pt and she was able to state her name, weakly lift and grip wtih RUE. Pt assisted back to bed by PT and leaving for stat head CT. Gait Pattern: Within Functional Limits Modified Rankin (Stroke Patients Only) Modified Rankin: Moderately severe disability (prior to decline in status)    Exercises    PT Goals  None set due to decline in status during session.  Visit Information  Last PT Received On: 12/15/11 Assistance Needed: +1    Subjective Data  Subjective: Pt initially able to answer all questions re: home environment, etc. By end of session, speech was non-sensical/garbled/slurred and unable to state her name. 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[employer], psychology) Communication Communication: No difficulties;Expressive difficulties (begining of session WFL; developed expressive problems) Dominant Hand: Right    Cognition  Overall Cognitive Status: Appears within functional limits for tasks assessed/performed Arousal/Alertness: Awake/alert (became lethargic with change in status) Orientation Level: Appears intact for tasks assessed Behavior During Session: [facility] for tasks performed    Extremity/Trunk Assessment Right Upper Extremity Assessment RUE ROM/Strength/Tone: Deficits RUE ROM/Strength/Tone Deficits: At beginning of session, moving [facility]; as symptoms appeared, pt unable to grip or lift with RUE; improved slightly when MD arrived RUE Sensation: WFL - Light Touch (initially, not reassessed once declined) Left Upper Extremity Assessment LUE ROM/Strength/Tone: Within functional levels LUE Sensation: WFL - Light Touch Right Lower Extremity Assessment RLE ROM/Strength/Tone: Deficits RLE ROM/Strength/Tone Deficits: AROM WFL; knee extension 4/5, knee flexion 3+/5, dorsiflexion 5/5 (all tested prior to decline) RLE Sensation: WFL - Light Touch Left Lower Extremity Assessment LLE ROM/Strength/Tone: Within functional levels LLE Sensation: WFL - Light Touch Trunk Assessment Trunk Assessment: Normal   Balance Balance Balance Assessed: Yes Static Sitting Balance Static Sitting - Balance Support: No upper extremity supported;Feet unsupported Static Sitting - Level of Assistance: 5: Stand by assistance (due to transient dizziness) Static Standing Balance Static Standing - Balance Support: No upper extremity supported Static Standing - Level of Assistance: 5: Stand by assistance Rhomberg - Eyes Opened: 30  Rhomberg - Eyes Closed: 30  High Level  Balance High Level Balance Comments: Reaching with RUE in standing--forward x 8 inches; pt missing target on her right and questioned visual field cut  End of Session PT - End of Session Equipment Utilized During Treatment: Gait belt Activity Tolerance: Treatment limited secondary to medical complications (Comment) Patient left: in bed;with call bell/phone within reach;with nursing in room Nurse Communication: Mobility status;Other (comment) (report re: pt's decline to RN, Rapid Resp, & MD)   Kailey Esquilin 12/15/2011, 12:32 PM  Pager [phone]

## 2011-12-15 NOTE — Progress Notes (Signed)
CBG: 68  Treatment: D50 IV 25 mL, 4 oz OJ  Symptoms: None  Follow-up CBG: Time: 1645 CBG Result:54   Possible Reasons for Event: Inadequate meal intake  Comments/MD notified:Given additional 25 ml D50 and pt ate fruit with dinner. Follow-up CBG 131.    Courtney Faulkner

## 2011-12-15 NOTE — Progress Notes (Signed)
SLP Cancellation Note  ST received SLE order this date and not completed due to change in medical status. ST to address 12/16/11 Moreen Fowler M.S., CCC-SLP [phone]  [facility] 12/15/2011, 3:23 PM

## 2011-12-15 NOTE — Progress Notes (Signed)
[facility] [place] TEAM 1 - Stepdown/ICU TEAM  Subjective: 61 year old [place] American female with no significant past medical history who was brought to the ED per EMS secondary to altered mental status. Patient had dysarthric speech and was incoherent.  EMS checked her blood glucose level which was 34.  Patient subsequently had a head CT done which was negative.  As I entered the room to exam the pt, PT and nursing were present, and reported an acute change in her mental status.  I found her to be confused, and markedly dysarthric.  She apparently had just been alert and conversant.  Her CBG was confirmed to be 98.  She is alert.  She is able to move her R arm and leg, but displays signif weakness in those extremities.  She has a modest R facial droop.  Her tongue is midline.   Objective: Weight change:   Intake/Output Summary (Last 24 hours) at 12/15/11 1110 Last data filed at 12/15/11 1000  Gross per 24 hour  Intake   1230 ml  Output   2010 ml  Net   -780 ml   Blood pressure 109/85, pulse 67, temperature 98.4 F (36.9 C), temperature source Oral, resp. rate 27, height 5\' 3"  (1.6 m), weight 92.987 kg (205 lb), SpO2 99.00%.  CBG (last 3)   Basename 12/15/11 1022 12/15/11 0924 12/15/11 0919  GLUCAP 117* 34* 28*    Physical Exam: General: No acute respiratory distress Lungs: Clear to auscultation bilaterally without wheezes or crackles Cardiovascular: Regular rate and rhythm without murmur gallop or rub  Abdomen: Nontender, nondistended, soft, bowel sounds positive, no rebound, no ascites, no appreciable mass Extremities: No significant cyanosis, clubbing, or edema bilateral lower extremities Neuro:  As documented in subjective section above  Lab Results:  Basename 12/15/11 0900 12/14/11 1742 12/14/11 1456 12/14/11 1236  NA 138 -- -- 136  K 4.1 -- -- 3.1*  CL 103 -- -- 101  CO2 25 -- -- 28  GLUCOSE 104* -- -- 152*  BUN 9 -- -- 14  CREATININE 0.70 0.81 -- 0.78    CALCIUM 9.1 -- -- 8.5  MG -- -- 1.9 --  PHOS -- -- -- --    Basename 12/15/11 0900 12/14/11 1236  AST 33 30  ALT 25 27  ALKPHOS 54 52  BILITOT 0.4 0.3  PROT 6.3 5.9*  ALBUMIN 2.7* 2.7*    Basename 12/15/11 0900 12/14/11 1742 12/14/11 1236  WBC 7.6 10.5 11.9*  NEUTROABS 5.2 -- 7.4  HGB 12.2 11.3* 11.8*  HCT 38.0 35.7* 36.0  MCV 95.7 94.4 94.0  PLT 306 268 280    Basename 12/15/11 0900 12/14/11 2227 12/14/11 1507  CKTOTAL 429* 430* 461*  CKMB 12.8* 13.2* 11.6*  CKMBINDEX -- -- --  TROPONINI 1.48* 1.34* <0.30   Micro Results: Recent Results (from the past 240 hour(s))  CULTURE, BLOOD (ROUTINE X 2)     Status: Normal (Preliminary result)   Collection Time   12/14/11  4:14 PM      Component Value Range Status Comment   Specimen Description BLOOD ARM RIGHT   Final    Special Requests BOTTLES DRAWN AEROBIC AND ANAEROBIC 10CC   Final    Culture  Setup Time [id number]   Final    Culture     Final    Value:        BLOOD CULTURE RECEIVED NO GROWTH TO DATE CULTURE WILL BE HELD FOR 5 DAYS BEFORE ISSUING A FINAL NEGATIVE REPORT  Report Status PENDING   Incomplete   MRSA PCR SCREENING     Status: Normal   Collection Time   12/14/11  5:52 PM      Component Value Range Status Comment   MRSA by PCR NEGATIVE  NEGATIVE  Final     Studies/Results: All recent x-ray/radiology reports have been reviewed in detail.   Medications: I have reviewed the patient's complete medication list.  Assessment/Plan:  Acute L hemisphere CVA (temporal lobe, basal ganglia, frontal cortex, internal capsule, insular cortex) MRI reveals suggestion of acute L internal carotid occlusion - dysarthria, aphasia and right sided weakness were initially improving - Stroke Team is directing w/u (see their note) - doppler studies confirm occluded L ICA - given her acute neurologic decline, a Code Stroke has been re-activated - she is being sent for a STAT CT head to r/o ICH/hemorrhagic conversion  Refractory  hypoglycemia Etiology unclear - no hx of DM and on no DM meds - dextrose dependent   Elevated cardiac enzymes Troponin is mildly elevated, and has increased since the first check - EKG is w/o acute change - this could be due to being down/unconcious (possibly as long as 3-4 hours) - will need to follow enzyme trend - recheck EKG in AM - tx medically with ASA, B blocker - unable to anticoag w/ heparin due to the risk for hemorrhagic conversion of moderate to large CVA - will consult Cards Monday to determine if further risk stratification is indicated (sooner if chest pain occurs or EKG/tele changes noted)  Pulmonary edema CM noted on CXR - echo has been ordered - no hypoxia  Hypokalemia Resolved w/ replacement  Leukocytosis Likely due to stress de-margination - resolved - follow trend  Anemia Admit hx reveals pt has had a recent colo   Dispo Given unstable neuro exam/stroke in evolution, pt is being transferred to the NICU/3100 - the Stroke Team has been informed   Lonia Blood, MD [facility] Office  [phone] Pager [phone]  On-Call/Text Page:      [email or web address]      password [facility]

## 2011-12-15 NOTE — Progress Notes (Signed)
OT Cancellation Note  Treatment cancelled today due to pt with decline in status during PT evaluation this morning.  Per PT report and MD recommendation,  OT signing off.  Will need clarification orders when pt is appropriate for OT evaluation. Please re-order when appropriate.  12/15/2011 Cipriano Mile OTR/L Pager [phone] Office [phone]

## 2011-12-16 ENCOUNTER — Encounter (HOSPITAL_COMMUNITY): Payer: Self-pay | Admitting: Cardiothoracic Surgery

## 2011-12-16 DIAGNOSIS — J811 Chronic pulmonary edema: Secondary | ICD-10-CM

## 2011-12-16 DIAGNOSIS — E876 Hypokalemia: Secondary | ICD-10-CM

## 2011-12-16 DIAGNOSIS — I6789 Other cerebrovascular disease: Secondary | ICD-10-CM

## 2011-12-16 DIAGNOSIS — D487 Neoplasm of uncertain behavior of other specified sites: Secondary | ICD-10-CM

## 2011-12-16 LAB — GLUCOSE, CAPILLARY
Glucose-Capillary: 100 mg/dL — ABNORMAL HIGH (ref 70–99)
Glucose-Capillary: 104 mg/dL — ABNORMAL HIGH (ref 70–99)
Glucose-Capillary: 109 mg/dL — ABNORMAL HIGH (ref 70–99)
Glucose-Capillary: 110 mg/dL — ABNORMAL HIGH (ref 70–99)
Glucose-Capillary: 114 mg/dL — ABNORMAL HIGH (ref 70–99)
Glucose-Capillary: 83 mg/dL (ref 70–99)

## 2011-12-16 LAB — BASIC METABOLIC PANEL
BUN: 6 mg/dL (ref 6–23)
BUN: 7 mg/dL (ref 6–23)
CO2: 25 mEq/L (ref 19–32)
CO2: 26 mEq/L (ref 19–32)
CO2: 27 mEq/L (ref 19–32)
CO2: 26 mEq/L (ref 19–32)
Calcium: 8.6 mg/dL (ref 8.4–10.5)
Calcium: 8.7 mg/dL (ref 8.4–10.5)
Calcium: 8.7 mg/dL (ref 8.4–10.5)
Chloride: 101 mEq/L (ref 96–112)
Chloride: 105 mEq/L (ref 96–112)
Chloride: 105 mEq/L (ref 96–112)
Creatinine, Ser: 0.61 mg/dL (ref 0.50–1.10)
Creatinine, Ser: 0.66 mg/dL (ref 0.50–1.10)
GFR calc Af Amer: >90 mL/min (ref >90)
GFR calc Af Amer: >90 mL/min (ref >90)
GFR calc non Af Amer: >90 mL/min (ref >90)
GFR calc non Af Amer: >90 mL/min (ref >90)
GFR calc non Af Amer: >90 mL/min (ref >90)
Glucose, Bld: 107 mg/dL — ABNORMAL HIGH (ref 70–99)
Glucose, Bld: 102 mg/dL — ABNORMAL HIGH (ref 70–99)
Potassium: 3.4 mEq/L — ABNORMAL LOW (ref 3.5–5.1)
Potassium: 3.8 mEq/L (ref 3.5–5.1)
Potassium: 3.6 mEq/L (ref 3.5–5.1)
Sodium: 136 mEq/L (ref 135–145)
Sodium: 137 mEq/L (ref 135–145)
Sodium: 138 mEq/L (ref 135–145)
Sodium: 138 mEq/L (ref 135–145)

## 2011-12-16 LAB — CBC
MCH: 30.4 pg (ref 26.0–34.0)
MCHC: 32.3 g/dL (ref 30.0–36.0)
MCV: 94 fL (ref 78.0–100.0)
Platelets: 261 10*3/uL (ref 150–400)
RDW: 14.4 % (ref 11.5–15.5)
WBC: 8.9 10*3/uL (ref 4.0–10.5)

## 2011-12-16 LAB — LIPID PANEL
Cholesterol: 134 mg/dL (ref 0–200)
Total CHOL/HDL Ratio: 3 RATIO

## 2011-12-16 LAB — VITAMIN B12: Vitamin B-12: 287 pg/mL (ref 211–911)

## 2011-12-16 LAB — FERRITIN: Ferritin: 200 ng/mL (ref 10–291)

## 2011-12-16 LAB — CARDIAC PANEL(CRET KIN+CKTOT+MB+TROPI): CK, MB: 7.9 ng/mL (ref 0.3–4.0)

## 2011-12-16 MED ORDER — VANCOMYCIN HCL IN DEXTROSE 1-5 GM/200ML-% IV SOLN
1000.0000 mg | Freq: Three times a day (TID) | INTRAVENOUS | Status: DC
  Administered 2011-12-17 – 2011-12-23 (×20): 1000 mg via INTRAVENOUS
  Filled 2011-12-16 (×23): qty 200

## 2011-12-16 MED ORDER — VANCOMYCIN HCL IN DEXTROSE 1-5 GM/200ML-% IV SOLN
1000.0000 mg | INTRAVENOUS | Status: AC
  Administered 2011-12-16: 1000 mg via INTRAVENOUS
  Filled 2011-12-16 (×2): qty 200

## 2011-12-16 MED ORDER — POTASSIUM CHLORIDE CRYS ER 20 MEQ PO TBCR
40.0000 meq | EXTENDED_RELEASE_TABLET | Freq: Once | ORAL | Status: AC
  Administered 2011-12-16: 40 meq via ORAL
  Filled 2011-12-16: qty 2

## 2011-12-16 MED ORDER — DEXTROSE 5 % IV SOLN
2.0000 g | INTRAVENOUS | Status: AC
  Administered 2011-12-16: 2 g via INTRAVENOUS
  Filled 2011-12-16 (×2): qty 2

## 2011-12-16 MED ORDER — DEXTROSE 5 % IV SOLN
2.0000 g | INTRAVENOUS | Status: DC
  Administered 2011-12-17 – 2011-12-22 (×6): 2 g via INTRAVENOUS
  Filled 2011-12-16 (×7): qty 2

## 2011-12-16 NOTE — Consult Note (Signed)
Name: Courtney Faulkner MRN: [id number] DOB: 02-Apr-1951    LOS: 2 Requesting MD:  Sharon Seller Regarding: Stoke/hypoglycemia   CONSULT NOTE  History of Present Illness: 61 yo AAF , sociology teacher at [employer], non smoker/drinker/no PMH , who presented 4/20 with garbled speech, hypoglycemia.  MRI revealed moderate lt hemispheric infarct. 4/21 she again had hypogylcemia and change in speech pattern. She was moved to NSICU and PCCM asked to assume her care. She is being followed by stroke team.   Lines / Drains:   Cultures: 4.20 bc x 2>>  Antibiotics: none  Tests / Events: 4/21 ct angio- Abnormal appearance of the left internal carotid artery with appearance of dissection with complete occlusion at the C2 level. Abnormal appearance of the proximal left common carotid artery. Question if this is related to artifact versus dissection versus atherosclerotic type changes. Other mild narrowing throughout Mild to moderate narrowing proximal right vertebral artery.. Nonspecific parenchymal changes lung apices greater on the right. Findings can be evaluated with a formal chest CT.  Acute non hemorrhagic infarct left basal ganglia, left uncus and left corona radiata.Left internal carotid artery is occluded. Collateral flow to the left anterior cerebral artery and left middle cerebral artery branches. The flow within the left middle cerebral artery branches<BR>is diminished suggesting that collateral flow is not adequate  4/21 ct head>>>Developing cytotoxic edema correlates with the observed pattern of restricted diffusion on MR, representing acute infarction. This primarily involves the left basal ganglia. Smaller areas of cortical and subcortical infarction seen on MRI in the left hemisphere are too small to be easily visible on CT. No  incipient uncal or transtentorial herniation, although minimal 1 mm left to right shift may be present. No hemorrhagic transformation.  4/21 tx to nsicu 4/22- unofficial left atrial mass / clot?  Subjective: Echo perfromed Vital Signs: Temp:  [98.9 F (37.2 C)-100.2 F (37.9 C)] 100.2 F (37.9 C) (04/22 0700) Pulse Rate:  [53-93] 77  (04/22 0800) Resp:  [17-30] 26  (04/22 0900) BP: (93-133)/(48-71) 114/53 mmHg (04/22 0900) SpO2:  [94 %-100 %] 96 % (04/22 0800) Weight:  [91.5 kg (201 lb 11.5 oz)-94.6 kg (208 lb 8.9 oz)] 94.6 kg (208 lb 8.9 oz) (04/22 0400) I/O last 3 completed shifts: In: 3542.5 [P.O.:480; I.V.:3062.5] Out: 3095 [Urine:3095]  Physical Examination: General:  WNWD NAD Neuro: follows commands,drift rt  HEENT:  No jvd Cardiovascular:  hsr rrr Lungs:  cta Abdomen:  +bs obese, NT ND Musculoskeletal:  intact Skin:  warm  Labs and Imaging:  Ct Angio Head W/cm &/or Wo Cm  12/16/2011  *RADIOLOGY REPORT*  Clinical Data:  Left hemispheric infarct.   Right-sided weakness.  CT ANGIOGRAPHY HEAD AND NECK  Technique:  Multidetector CT imaging of the head and neck was performed using the standard protocol during bolus administration of intravenous contrast.  Multiplanar CT image reconstructions including MIPs  were obtained to evaluate the vascular anatomy. Carotid stenosis measurements (when applicable) are obtained utilizing NASCET criteria, using the distal internal carotid diameter as the denominator.  Contrast: 50mL OMNIPAQUE IOHEXOL 350 MG/ML SOLN  Comparison:  12/14/2011 MR brain and head CT.  CTA NECK  Findings:  Abnormal appearance of the left internal carotid artery with appearance of dissection with complete occlusion at the C2 level.  Abnormal appearance of the proximal left common carotid artery. Question if this is related to artifact versus dissection versus atherosclerotic type changes.  Common origin of the right innominate artery and left common carotid artery.  Mild  narrowing proximal right common carotid artery.  Prominent ectasia of the proximal right common carotid artery with fold and mild narrowing.  Mild irregularity at the right carotid bifurcation without measurable stenosis.  Mild to moderate narrowing proximal right vertebral artery.  Mild irregularity throughout the course of the right vertebral artery.  Left vertebral artery arises directly from the aortic arch. Mild narrowing proximal left vertebral artery.  Evaluation of portions of the proximal left vertebral artery is limited by artifact.  Nonspecific parenchymal changes lung apices greater on the right. Prominent preaortic lymph node.  Findings can be evaluated with a formal chest CT.  Mild cervical spondylotic changes.   Review of the MIP images confirms the above findings.  IMPRESSION: Abnormal appearance of the left internal carotid artery with appearance of dissection with complete occlusion at the C2 level.  Abnormal appearance of the proximal left common carotid artery. Question if this is related to artifact versus dissection versus atherosclerotic type changes.  Mild narrowing proximal right common carotid artery.  Prominent ectasia of the proximal right common carotid artery with fold and mild narrowing.  Mild irregularity at the right carotid bifurcation without measurable stenosis.  Mild to moderate narrowing proximal right vertebral artery.  Mild irregularity throughout the course of the right vertebral artery.  Left vertebral artery arises directly from the aortic arch. Mild narrowing proximal left vertebral artery.  Evaluation of portions of the proximal left vertebral artery is limited by artifact.  Nonspecific parenchymal changes lung apices greater on the right. Findings can be evaluated with a formal chest CT.  CTA HEAD  Findings:  Acute non hemorrhagic infarct left basal ganglia, left uncus and left corona radiata.  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  Collateral flow to the left  anterior cerebral artery and left middle cerebral artery branches.  The flow within the left middle cerebral artery branches is diminished suggesting that collateral flow is not adequate. Non filling of the left carotid terminus and majority of the proximal M1 segment of the left middle cerebral artery.  Mild irregularity vertebral arteries bilaterally.  Irregularity with mild narrowing basilar artery.  No aneurysm or vascular malformation detected.  No intracranial mass or abnormal enhancement.  Cerebellar tonsils minimally low-lying but within range normal limits.   Review of the MIP images confirms the above findings.  IMPRESSION: Acute non hemorrhagic infarct left basal ganglia, left uncus and left corona radiata.  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  Collateral flow to the left anterior cerebral artery and left middle cerebral artery branches.  The flow within the left middle cerebral artery branches is diminished suggesting that collateral flow is not adequate.  Initial interpretation at the time of imaging by Dr. Benard Rink with preliminary result rendered  (attempted contact of ordering physcian unsuscessful).  Original Report Authenticated By: Fuller Canada, M.D.   Dg Chest 2 View  12/14/2011  *  RADIOLOGY REPORT*  Clinical Data: Weakness.  Altered level of consciousness.  CHEST - 2 VIEW  Comparison: None.  Findings: There is cardiomegaly and bilateral airspace disease most consistent with pulmonary edema.  No pneumothorax.  IMPRESSION: Cardiomegaly and bilateral airspace disease most consistent with pulmonary edema.  Original Report Authenticated By: Bernadene Bell. Maricela Curet, M.D.   Ct Head Wo Contrast  12/15/2011  *RADIOLOGY REPORT*  Clinical Data: Worsening symptoms.  Evaluate for hemorrhagic transformation.  CT HEAD WITHOUT CONTRAST  Technique:  Contiguous axial images were obtained from the base of the skull through the vertex without contrast.  Comparison: CT head 12/14/2011.  MR head 12/14/2011.   Findings: Interval change in the appearance of the CT scan compared with yesterday, now with developing cytotoxic edema representing acute infarction correlating with the observed pattern of abnormal restricted diffusion on MR.  This cytotoxic edema involves the primarily the basal ganglia structures on the left.  No evidence for hemorrhagic transformation. Equivocal 1 mm left to right shift. There is preservation of the perimesencephalic and suprasellar cistern CSF spaces, with no evidence for uncal herniation. Symmetric sylvian fissures noted.  No hydrocephalus.  No extra- axial fluid collection or mass lesion.  IMPRESSION: Developing cytotoxic edema correlates with the observed pattern of restricted diffusion on MR, representing acute infarction. This primarily involves the left basal ganglia.  Smaller areas of cortical and subcortical infarction seen on MRI in the left hemisphere are too small to be easily visible on CT.   No incipient uncal or transtentorial herniation, although minimal 1 mm left to right shift may be present.  No hemorrhagic transformation.  Original Report Authenticated By: Elsie Stain, M.D.   Ct Head Wo Contrast  12/14/2011  *RADIOLOGY REPORT*  Clinical Data:  Found unresponsive around 9:00 a.m.  Some speech difficulty with nonfocal nerve exam. Severely hypoglycemic on arrival.  CT HEAD WITHOUT CONTRAST  Technique:  Contiguous axial images were obtained from the base of the skull through the vertex without contrast  Comparison:  None.  Findings:  The brain has a normal appearance without evidence for hemorrhage, acute infarction, hydrocephalus, or mass lesion.  There is no extra axial fluid collection.  The skull and paranasal sinuses are normal.  IMPRESSION: Normal CT of the head without contrast.  Original Report Authenticated By: Elsie Stain, M.D.   Ct Angio Neck W/cm &/or Wo/cm  12/16/2011  *RADIOLOGY REPORT*  Clinical Data:  Left hemispheric infarct.   Right-sided weakness.   CT ANGIOGRAPHY HEAD AND NECK  Technique:  Multidetector CT imaging of the head and neck was performed using the standard protocol during bolus administration of intravenous contrast.  Multiplanar CT image reconstructions including MIPs were obtained to evaluate the vascular anatomy. Carotid stenosis measurements (when applicable) are obtained utilizing NASCET criteria, using the distal internal carotid diameter as the denominator.  Contrast: 50mL OMNIPAQUE IOHEXOL 350 MG/ML SOLN  Comparison:  12/14/2011 MR brain and head CT.  CTA NECK  Findings:  Abnormal appearance of the left internal carotid artery with appearance of dissection with complete occlusion at the C2 level.  Abnormal appearance of the proximal left common carotid artery. Question if this is related to artifact versus dissection versus atherosclerotic type changes.  Common origin of the right innominate artery and left common carotid artery.  Mild narrowing proximal right common carotid artery.  Prominent ectasia of the proximal right common carotid artery with fold and mild narrowing.  Mild irregularity at the right carotid bifurcation without measurable stenosis.  Mild  to moderate narrowing proximal right vertebral artery.  Mild irregularity throughout the course of the right vertebral artery.  Left vertebral artery arises directly from the aortic arch. Mild narrowing proximal left vertebral artery.  Evaluation of portions of the proximal left vertebral artery is limited by artifact.  Nonspecific parenchymal changes lung apices greater on the right. Prominent preaortic lymph node.  Findings can be evaluated with a formal chest CT.  Mild cervical spondylotic changes.   Review of the MIP images confirms the above findings.  IMPRESSION: Abnormal appearance of the left internal carotid artery with appearance of dissection with complete occlusion at the C2 level.  Abnormal appearance of the proximal left common carotid artery. Question if this is related  to artifact versus dissection versus atherosclerotic type changes.  Mild narrowing proximal right common carotid artery.  Prominent ectasia of the proximal right common carotid artery with fold and mild narrowing.  Mild irregularity at the right carotid bifurcation without measurable stenosis.  Mild to moderate narrowing proximal right vertebral artery.  Mild irregularity throughout the course of the right vertebral artery.  Left vertebral artery arises directly from the aortic arch. Mild narrowing proximal left vertebral artery.  Evaluation of portions of the proximal left vertebral artery is limited by artifact.  Nonspecific parenchymal changes lung apices greater on the right. Findings can be evaluated with a formal chest CT.  CTA HEAD  Findings:  Acute non hemorrhagic infarct left basal ganglia, left uncus and left corona radiata.  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  Collateral flow to the left anterior cerebral artery and left middle cerebral artery branches.  The flow within the left middle cerebral artery branches is diminished suggesting that collateral flow is not adequate. Non filling of the left carotid terminus and majority of the proximal M1 segment of the left middle cerebral artery.  Mild irregularity vertebral arteries bilaterally.  Irregularity with mild narrowing basilar artery.  No aneurysm or vascular malformation detected.  No intracranial mass or abnormal enhancement.  Cerebellar tonsils minimally low-lying but within range normal limits.   Review of the MIP images confirms the above findings.  IMPRESSION: Acute non hemorrhagic infarct left basal ganglia, left uncus and left corona radiata.  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  Collateral flow to the left anterior cerebral artery and left middle cerebral artery branches.  The flow within the left middle cerebral artery branches is diminished suggesting that collateral flow is not adequate.  Initial interpretation at the time of  imaging by Dr. Benard Rink with preliminary result rendered  (attempted contact of ordering physcian unsuscessful).  Original Report Authenticated By: Fuller Canada, M.D.   Mr Brain Wo Contrast  12/14/2011  *RADIOLOGY REPORT*  Clinical Data:  Hypoglycemia, right-sided hemipareses.  Aphasia.  MRI HEAD WITHOUT CONTRAST  Technique:  Multiplanar, multiecho pulse sequences of the brain and surrounding structures were obtained without intravenous contrast.  Comparison:  CT head earlier in the day.  Findings:   There is a moderately large acute left MCA territory infarction affecting the temporal lobe, basal ganglia, anterior frontal cortex, posterior frontal cortex, and frontal opercular subcortical white matter.  The posterior limb internal capsule and insular cortex are also affected.  There is no visible hemorrhage. Prominent perivascular space left anterior commissure region. Minimal subcortical and periventricular white matter signal could represent early chronic microvascular ischemic change.  The flow voids in the right internal carotid artery, basilar artery, in both vertebral arteries are preserved, but there is absence of flow related  enhancement in the left internal carotid artery in its visible cervical, skull base, and supraclinoid segments.  There may be diminished flow related enhancement in the distal M1 segment left middle cerebral artery, and there is slow flow in the M2/M3 segments of the left MCA. This likely represents an acute occlusion, based on the relatively prolonged intravascular LICA T2 signal.  Normal pituitary.  Slight cerebellar tonsillar ectopia of 5 mm without frank Chiari I malformation.  Negative orbits.  No acute sinus or mastoid disease.  Compared with prior CT, the infarct is not visible.  IMPRESSION: Moderate sized acute left hemisphere infarct which appears to be secondary to an acute left internal carotid artery occlusion. No visible hemorrhage or midline shift.  Emboli to the left  middle cerebral artery and its branches is not excluded. See comments above.  Discussed with Dr. Janee Morn at time of dictation.  Original Report Authenticated By: Elsie Stain, M.D.    Lab 12/16/11 0635 12/16/11 0311 12/16/11 0010  NA 137 136 136  K 3.5 3.4* 3.5  CL 103 102 101  CO2 26 26 25   BUN 7 7 6   CREATININE 0.66 0.65 0.61  GLUCOSE 102* 107* 110*    Lab 12/16/11 0311 12/15/11 0900 12/14/11 1742  HGB 11.2* 12.2 11.3*  HCT 34.7* 38.0 35.7*  WBC 8.9 7.6 10.5  PLT 261 306 268   ABG No results found for this basename: phart,  pco2,  pco2art,  po2,  po2art,  hco3,  tco2,  acidbasedef,  o2sat    Assessment and Plan: Recurrent hypoglycemia R/o insulinoma (malignant?), sepsis -continue d10  , cbg wnl -c-peptic elevated, would be c/w insulinoma, neuroendocrine tumor -assess insulin level, pre insulin now -want to image pancrease, will consider in am after head contrast just given -want to image chest for lymphad noted , see pulm -Ill order CT for am  -ca 19-9  New lt stroke -per stroke team -Hypercoagulable?  cancer? -BC for cytology -may require heparin  Clot / mass lEft atrium Await offical read echo from cards May need heparin if clot determine  Hypokalemia replace Recheck in am   Best practices / Disposition: -->ICU status under PCCM -->full code -->lovenox for DVT Px -->Protonix not indicated -->diet -->family updated at bedside, husband, will call son if pt allows as well   Mcarthur Rossetti. Tyson Alias, MD, FACP Pgr: [phone] [facility]

## 2011-12-16 NOTE — Consult Note (Signed)
[address]            [place] [address]          [phone]       Courtney Faulkner [facility] Medical Record #[id number] Date of Birth: [birth year]  Referring: No ref. provider found Primary Care: DEFAULT,PROVIDER, MD, MD  Chief Complaint:    Chief Complaint  Patient presents with  . Hypoglycemia  . Altered Mental Status    History of Present Illness:        Current Activity/ Functional Status: Patient is not independent with mobility/ambulation, transfers, ADL's, IADL's.   Past Medical History  Diagnosis Date  . Recurrent upper respiratory infection (URI)   . No pertinent past medical history     History reviewed. No pertinent past surgical history.  History  Smoking status  . Former Smoker  . Quit date: 07/15/2002  Smokeless tobacco  . Never Used    History  Alcohol Use No    History   Social History  . Marital Status: Married    Spouse Name: N/A    Number of Children: N/A  . Years of Education: N/A   Occupational History  . Not on file.   Social History Main Topics  . Smoking status: Former Smoker    Quit date: 07/15/2002  . Smokeless tobacco: Never Used  . Alcohol Use: No  . Drug Use: No  . Sexually Active: Yes    Birth Control/ Protection: Post-menopausal   Other Topics Concern  . Not on file   Social History Narrative  . No narrative on file    No Known Allergies  Current Facility-Administered Medications  Medication Dose Route Frequency Provider Last Rate Last Dose  . 0.9 %  sodium chloride infusion   Intravenous Continuous Levert Feinstein, MD 75 mL/hr at 12/16/11 1900    . acetaminophen (TYLENOL) suppository 650 mg  650 mg Rectal Q4H PRN Rodolph Bong, MD      . antiseptic oral rinse (BIOTENE) solution 15 mL  15 mL Mouth Rinse BID Rodolph Bong, MD      . aspirin suppository 300 mg  300 mg Rectal Daily Rodolph Bong, MD       Or  . aspirin tablet 325 mg  325 mg Oral Daily Rodolph Bong, MD   325 mg at 12/16/11 0955  . cefTRIAXone (ROCEPHIN) 2 g in dextrose 5 % 50 mL IVPB  2 g Intravenous NOW Ann Held, PHARMD      . cefTRIAXone (ROCEPHIN) 2 g in dextrose 5 % 50 mL IVPB  2 g Intravenous Q24H Ann Held, PHARMD      . dextrose 10 % infusion   Intravenous Continuous Rodolph Bong, MD 50 mL/hr at 12/16/11 1900    . dextrose 50 % solution 25 mL  25 mL Intravenous Once PRN Lonia Blood, MD      . dextrose 50 % solution 50 mL  1 ampule Intravenous PRN Lonia Blood, MD   25 mL at 12/15/11 1804  . enoxaparin (LOVENOX) injection 40 mg  40 mg Subcutaneous Q24H Rodolph Bong, MD   40 mg at 12/15/11 2155  . ondansetron (ZOFRAN) injection 4 mg  4 mg Intravenous Q6H PRN Rodolph Bong, MD      . potassium chloride SA (K-DUR,KLOR-CON) CR tablet 40 mEq  40  mEq Oral Once Nelda Bucks, MD   40 mEq at 12/16/11 1424  . vancomycin (VANCOCIN) IVPB 1000 mg/200 mL premix  1,000 mg Intravenous NOW Ann Held, PHARMD      . vancomycin (VANCOCIN) IVPB 1000 mg/200 mL premix  1,000 mg Intravenous Q8H Ann Held, [facility]        Prescriptions prior to admission  Medication Sig Dispense Refill  . albuterol (PROVENTIL HFA;VENTOLIN HFA) 108 (90 BASE) MCG/ACT inhaler Inhale 2 puffs into the lungs every 6 (six) hours as needed. For wheezing      . predniSONE (DELTASONE) 10 MG tablet Take 10 mg by mouth daily. 12 day pack filled on 12-06-11        History reviewed. No pertinent family history.   Review of Systems:     Cardiac Review of Systems: Y or N  Chest Pain [   n ]  Resting SOB [ n  ] Exertional SOB  [ n ]  Orthopnea [ n ]   Pedal Edema [ n  ]    Palpitations [ n ] Syncope  [ y ]   Presyncope [  y ]  General Review of Systems: [Y] = yes [  ]=no Constitional: recent weight change [  ]; anorexia [  ]; fatigue [  ]; nausea [  ]; night sweats [ y ]; fever [  ]; or chills [  ];                                                                                                                                           Dental: poor dentition[  ]; .  Eye : blurred vision [  ]; diplopia [   ]; vision changes [  ];  Amaurosis fugax[  ]; Resp: cough [  ];  wheezing[  ];  hemoptysis[  ]; shortness of breath[  ]; paroxysmal nocturnal dyspnea[  ]; dyspnea on exertion[  ]; or orthopnea[  ];  GI:  gallstones[  ], vomiting[  ];  dysphagia[  ]; melena[  ];  hematochezia [  ]; heartburn[  ];   Hx of  Colonoscopy[  ]; GU: kidney stones [  ]; hematuria[  ];   dysuria [  ];  nocturia[  ];  history of     obstruction [  ];             Skin: rash, swelling[  ];, hair loss[  ];  peripheral edema[  ];  or itching[  ]; Musculosketetal: myalgias[  ];  joint swelling[  ];  joint erythema[  ];  joint pain[  ];  back pain[  ];  Heme/Lymph: bruising[  ];  bleeding[  ];  anemia[  ];  Neuro: TIA[  ];  headaches[  ];  stroke[  y];  vertigo[  ];  seizures[ n ];   paresthesias[y  ];  difficulty walking[y  ];  Psych:depression[  ]; anxiety[  ];  Endocrine: diabetes[  ];  thyroid dysfunction[  ];  Immunizations: Flu [  ]; Pneumococcal[  ];  Other:  Physical Exam: BP 122/59  Pulse 76  Temp(Src) 99.6 F (37.6 C) (Oral)  Resp 30  Ht 5\' 3"  (1.6 m)  Wt 208 lb 8.9 oz (94.6 kg)  BMI 36.94 kg/m2  SpO2 97%  General appearance: cooperative, appears older than stated age, distracted, fatigued and slowed mentation Neurologic: right sided weakness Heart: regular rate and rhythm, S1, S2 normal, no murmur, click, rub or gallop, normal apical impulse, no click and no rub Lungs: clear to auscultation bilaterally and normal percussion bilaterally Abdomen: soft, non-tender; bowel sounds normal; no masses,  no organomegaly Extremities: extremities normal, atraumatic, no cyanosis or edema, Homans sign is negative, no sign of DVT, no edema, redness or tenderness in the calves or thighs and no ulcers, gangrene or trophic changes no carotid bruits noted   Diagnostic Studies &  Laboratory data:     Recent Radiology Findings:   Ct Angio Head W/cm &/or Wo Cm  12/16/2011  *RADIOLOGY REPORT*  Clinical Data:  Left hemispheric infarct.   Right-sided weakness.  CT ANGIOGRAPHY HEAD AND NECK  Technique:  Multidetector CT imaging of the head and neck was performed using the standard protocol during bolus administration of intravenous contrast.  Multiplanar CT image reconstructions including MIPs were obtained to evaluate the vascular anatomy. Carotid stenosis measurements (when applicable) are obtained utilizing NASCET criteria, using the distal internal carotid diameter as the denominator.  Contrast: 50mL OMNIPAQUE IOHEXOL 350 MG/ML SOLN  Comparison:  12/14/2011 MR brain and head CT.  CTA NECK  Findings:  Abnormal appearance of the left internal carotid artery with appearance of dissection with complete occlusion at the C2 level.  Abnormal appearance of the proximal left common carotid artery. Question if this is related to artifact versus dissection versus atherosclerotic type changes.  Common origin of the right innominate artery and left common carotid artery.  Mild narrowing proximal right common carotid artery.  Prominent ectasia of the proximal right common carotid artery with fold and mild narrowing.  Mild irregularity at the right carotid bifurcation without measurable stenosis.  Mild to moderate narrowing proximal right vertebral artery.  Mild irregularity throughout the course of the right vertebral artery.  Left vertebral artery arises directly from the aortic arch. Mild narrowing proximal left vertebral artery.  Evaluation of portions of the proximal left vertebral artery is limited by artifact.  Nonspecific parenchymal changes lung apices greater on the right. Prominent preaortic lymph node.  Findings can be evaluated with a formal chest CT.  Mild cervical spondylotic changes.   Review of the MIP images confirms the above findings.  IMPRESSION: Abnormal appearance of the left  internal carotid artery with appearance of dissection with complete occlusion at the C2 level.  Abnormal appearance of the proximal left common carotid artery. Question if this is related to artifact versus dissection versus atherosclerotic type changes.  Mild narrowing proximal right common carotid artery.  Prominent ectasia of the proximal right common carotid artery with fold and mild narrowing.  Mild irregularity at the right carotid bifurcation without measurable stenosis.  Mild to moderate narrowing proximal right vertebral artery.  Mild irregularity throughout the course of the right vertebral artery.  Left vertebral artery arises directly from the aortic arch. Mild narrowing proximal left vertebral artery.  Evaluation of portions of the proximal left vertebral artery is limited by  artifact.  Nonspecific parenchymal changes lung apices greater on the right. Findings can be evaluated with a formal chest CT.  CTA HEAD  Findings:  Acute non hemorrhagic infarct left basal ganglia, left uncus and left corona radiata.  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  Collateral flow to the left anterior cerebral artery and left middle cerebral artery branches.  The flow within the left middle cerebral artery branches is diminished suggesting that collateral flow is not adequate. Non filling of the left carotid terminus and majority of the proximal M1 segment of the left middle cerebral artery.  Mild irregularity vertebral arteries bilaterally.  Irregularity with mild narrowing basilar artery.  No aneurysm or vascular malformation detected.  No intracranial mass or abnormal enhancement.  Cerebellar tonsils minimally low-lying but within range normal limits.   Review of the MIP images confirms the above findings.  IMPRESSION: Acute non hemorrhagic infarct left basal ganglia, left uncus and left corona radiata.  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  Collateral flow to the left anterior cerebral artery and left middle  cerebral artery branches.  The flow within the left middle cerebral artery branches is diminished suggesting that collateral flow is not adequate.  Initial interpretation at the time of imaging by Dr. Benard Rink with preliminary result rendered  (attempted contact of ordering physcian unsuscessful).  Original Report Authenticated By: Fuller Canada, M.D.   Ct Head Wo Contrast  12/15/2011  *RADIOLOGY REPORT*  Clinical Data: Worsening symptoms.  Evaluate for hemorrhagic transformation.  CT HEAD WITHOUT CONTRAST  Technique:  Contiguous axial images were obtained from the base of the skull through the vertex without contrast.  Comparison: CT head 12/14/2011.  MR head 12/14/2011.  Findings: Interval change in the appearance of the CT scan compared with yesterday, now with developing cytotoxic edema representing acute infarction correlating with the observed pattern of abnormal restricted diffusion on MR.  This cytotoxic edema involves the primarily the basal ganglia structures on the left.  No evidence for hemorrhagic transformation. Equivocal 1 mm left to right shift. There is preservation of the perimesencephalic and suprasellar cistern CSF spaces, with no evidence for uncal herniation. Symmetric sylvian fissures noted.  No hydrocephalus.  No extra- axial fluid collection or mass lesion.  IMPRESSION: Developing cytotoxic edema correlates with the observed pattern of restricted diffusion on MR, representing acute infarction. This primarily involves the left basal ganglia.  Smaller areas of cortical and subcortical infarction seen on MRI in the left hemisphere are too small to be easily visible on CT.   No incipient uncal or transtentorial herniation, although minimal 1 mm left to right shift may be present.  No hemorrhagic transformation.  Original Report Authenticated By: Elsie Stain, M.D.   Ct Angio Neck W/cm &/or Wo/cm  12/16/2011  *RADIOLOGY REPORT*  Clinical Data:  Left hemispheric infarct.   Right-sided  weakness.  CT ANGIOGRAPHY HEAD AND NECK  Technique:  Multidetector CT imaging of the head and neck was performed using the standard protocol during bolus administration of intravenous contrast.  Multiplanar CT image reconstructions including MIPs were obtained to evaluate the vascular anatomy. Carotid stenosis measurements (when applicable) are obtained utilizing NASCET criteria, using the distal internal carotid diameter as the denominator.  Contrast: 50mL OMNIPAQUE IOHEXOL 350 MG/ML SOLN  Comparison:  12/14/2011 MR brain and head CT.  CTA NECK  Findings:  Abnormal appearance of the left internal carotid artery with appearance of dissection with complete occlusion at the C2 level.  Abnormal appearance of the proximal left  common carotid artery. Question if this is related to artifact versus dissection versus atherosclerotic type changes.  Common origin of the right innominate artery and left common carotid artery.  Mild narrowing proximal right common carotid artery.  Prominent ectasia of the proximal right common carotid artery with fold and mild narrowing.  Mild irregularity at the right carotid bifurcation without measurable stenosis.  Mild to moderate narrowing proximal right vertebral artery.  Mild irregularity throughout the course of the right vertebral artery.  Left vertebral artery arises directly from the aortic arch. Mild narrowing proximal left vertebral artery.  Evaluation of portions of the proximal left vertebral artery is limited by artifact.  Nonspecific parenchymal changes lung apices greater on the right. Prominent preaortic lymph node.  Findings can be evaluated with a formal chest CT.  Mild cervical spondylotic changes.   Review of the MIP images confirms the above findings.  IMPRESSION: Abnormal appearance of the left internal carotid artery with appearance of dissection with complete occlusion at the C2 level.  Abnormal appearance of the proximal left common carotid artery. Question if this  is related to artifact versus dissection versus atherosclerotic type changes.  Mild narrowing proximal right common carotid artery.  Prominent ectasia of the proximal right common carotid artery with fold and mild narrowing.  Mild irregularity at the right carotid bifurcation without measurable stenosis.  Mild to moderate narrowing proximal right vertebral artery.  Mild irregularity throughout the course of the right vertebral artery.  Left vertebral artery arises directly from the aortic arch. Mild narrowing proximal left vertebral artery.  Evaluation of portions of the proximal left vertebral artery is limited by artifact.  Nonspecific parenchymal changes lung apices greater on the right. Findings can be evaluated with a formal chest CT.  CTA HEAD  Findings:  Acute non hemorrhagic infarct left basal ganglia, left uncus and left corona radiata.  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  Collateral flow to the left anterior cerebral artery and left middle cerebral artery branches.  The flow within the left middle cerebral artery branches is diminished suggesting that collateral flow is not adequate. Non filling of the left carotid terminus and majority of the proximal M1 segment of the left middle cerebral artery.  Mild irregularity vertebral arteries bilaterally.  Irregularity with mild narrowing basilar artery.  No aneurysm or vascular malformation detected.  No intracranial mass or abnormal enhancement.  Cerebellar tonsils minimally low-lying but within range normal limits.   Review of the MIP images confirms the above findings.  IMPRESSION: Acute non hemorrhagic infarct left basal ganglia, left uncus and left corona radiata.  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  Collateral flow to the left anterior cerebral artery and left middle cerebral artery branches.  The flow within the left middle cerebral artery branches is diminished suggesting that collateral flow is not adequate.  Initial interpretation at the  time of imaging by Dr. Benard Rink with preliminary result rendered  (attempted contact of ordering physcian unsuscessful).  Original Report Authenticated By: Fuller Canada, M.D.      Recent Lab Findings: Lab Results  Component Value Date   WBC 8.9 12/16/2011   HGB 11.2* 12/16/2011   HCT 34.7* 12/16/2011   PLT 261 12/16/2011   GLUCOSE 97 12/16/2011   CHOL 134 12/16/2011   TRIG 90 12/16/2011   HDL 45 12/16/2011   LDLCALC 71 12/16/2011   ALT 25 12/15/2011   AST 33 12/15/2011   NA 138 12/16/2011   K 3.8 12/16/2011   CL 105  12/16/2011   CREATININE 0.68 12/16/2011   BUN 7 12/16/2011   CO2 25 12/16/2011   TSH 3.588 12/16/2011   HGBA1C 5.1 12/16/2011    Lab Results  Component Value Date   CKTOTAL 349* 12/16/2011   CKMB 7.9* 12/16/2011   TROPONINI 0.98* 12/16/2011   ECHO: : Courtney Faulkner, Courtney Faulkner MR #: [id number] Study Date: 12/16/2011 Gender: F Age: 61 Height: 160cm Weight: 94.3kg BSA: 1.89m^2 Pt. Status: Room: 2605  PERFORMING [facility] ADMITTING [place] SONOGRAPHER [address] ATTENDING Forbes Cellar cc:  ------------------------------------------------------------ LV EF: 60% - 65%  ------------------------------------------------------------ Indications: CVA 436.  ------------------------------------------------------------ History: PMH: anemia, pulm edema Risk factors: Former tobacco use.  ------------------------------------------------------------ Study Conclusions  - Left ventricle: The cavity size was normal. Wall thickness was increased in a pattern of mild LVH. Systolic function was normal. The estimated ejection fraction was in the range of 60% to 65%. Wall motion was normal; there were no regional wall motion abnormalities. Doppler parameters are consistent with abnormal left ventricular relaxation (grade 1 diastolic dysfunction). - Left atrium: The atrium was mildly dilated. There was a mass. There was a medium-sized,  22mm (L) x 18mm (W)masson the left side of the interatrial septum; the appearance is consistent with myxoma. Impressions:  - Left atrial mass attached to intra-atrial septum highly suggestive of myxoma. Transthoracic echocardiography. M-mode, complete 2D, spectral Doppler, and color Doppler. Height: Height: 160cm. Height: 63in. Weight: Weight: 94.3kg. Weight: 207.6lb. Body mass index: BMI: 36.8kg/m^2. Body surface area: BSA: 1.50m^2. Blood pressure: 114/53. Patient status: Inpatient. Location: Bedside.  ------------------------------------------------------------  ------------------------------------------------------------ Left ventricle: The cavity size was normal. Wall thickness was increased in a pattern of mild LVH. Systolic function was normal. The estimated ejection fraction was in the range of 60% to 65%. Wall motion was normal; there were no regional wall motion abnormalities. Doppler parameters are consistent with abnormal left ventricular relaxation (grade 1 diastolic dysfunction).  ------------------------------------------------------------ Aortic valve: Structurally normal valve. Trileaflet. Cusp separation was normal. Doppler: Transvalvular velocity was within the normal range. There was no stenosis. No regurgitation.  ------------------------------------------------------------ Aorta: Aortic root: The aortic root was normal in size. Ascending aorta: The ascending aorta was normal in size.  ------------------------------------------------------------ Mitral valve: Structurally normal valve. Leaflet separation was normal. Doppler: Transvalvular velocity was within the normal range. There was no evidence for stenosis. No regurgitation. Peak gradient: 2mm Hg (D).  ------------------------------------------------------------ Left atrium: There is a 2.2cm x1.8 cm highly mobile frond-like mass attached to the left side of the intra-atrial septum very suggestive of  left atrial myxoma. The atrium was mildly dilated. There was a mass. There was a medium-sized, 22mm (L) x 18mm (W)masson the left side of the interatrial septum; the appearance is consistent with myxoma.  ------------------------------------------------------------ Right ventricle: The cavity size was normal. Systolic function was normal.  ------------------------------------------------------------ Pulmonic valve: Structurally normal valve. Cusp separation was normal. Doppler: Transvalvular velocity was within the normal range. No regurgitation.  ------------------------------------------------------------ Tricuspid valve: Structurally normal valve. Leaflet separation was normal. Doppler: Transvalvular velocity was within the normal range. No regurgitation.  ------------------------------------------------------------ Right atrium: The atrium was normal in size.  ------------------------------------------------------------ Pericardium: There was no pericardial effusion.  ------------------------------------------------------------  2D measurements Normal Doppler measurements Normal Left ventricle Mitral valve LVID ED, 43.7 mm 43-52 Peak E vel 77. cm/s ------ chord, 5 PLAX Peak A vel 48. cm/s ------ LVID ES, 19.9 mm 23-38 4 chord, Deceleration 169 ms 150-23 PLAX time 0 FS, chord, 54 % >29 Peak 2 mm ------ PLAX gradient,  D Hg LVPW, ED 11.3 mm ------ Peak E/A 1.6 ------ IVS/LVPW 0.64 <1.3 ratio ratio, ED Ventricular septum IVS, ED 7.2 mm ------ Aorta Root diam, 30 mm ------ ED Left atrium AP dim 39 mm ------ AP dim 1.98 cm/m^2 <2.2 index  ------------------------------------------------------------ Prepared and Electronically Authenticated by  Cassell Clement, MD 2013-04-22T16:31:[id number]         Assessment / Plan:      Acute left hemispheric With speech difficulty and right arm and leg weakness , question of left carotid dissection by ct  elevated CK/mb  and troponin 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     Delight Ovens MD  Beeper [phone] Office [phone] 12/16/2011 7:26 PM

## 2011-12-16 NOTE — Progress Notes (Signed)
Stroke Team Progress Note  HISTORY Courtney Faulkner is an 61 y.o. female with dysarthria, aphasia and right sided weakness. According to son at bedside, his father heard her vaccuming at ~08:30 on 12/14/2011. He came down stairs at 11:30 and found her confused and dysarthric. EMS called and found BS 36. Patient was subsequently brought to the ED. She arrive at 1214. In the ED patient was given some orange juice and her blood CBG went to 166. Patient subsequently had a head CT done which was negative. Initially a code stroke was called however it was canceled secondary to patient's hypoglycemic spells. Per family patient was recently been treated for upper respiratory symptoms with prednisone and albuterol. Patient and family deny any recent fevers, no chills, no chest pain, no diarrhea, no constipation, no dysuria, no generalized weakness. Patient's family does endorse some shortness of breath on exertion when patient climbs up stairs which has been ongoing for the past 2 weeks. On exam and during the interview patient has some dysarthric speech is alert to self only and has an incoherent speech as well. After treatment, CBG 166, pt remains confused, and having difficulty following commands.  Neuro consult called by Dr. Janee Morn for this reason. Patient was not a TPA candidate secondary to presenting beyond the tPA window. She was transferred to the ICU after admission for hypoglycemia and neuro decline for further evaluation and treatment.  SUBJECTIVE No family is at the bedside.  Overall she feels her condition is gradually worsening since earlier this am. She has in fact had a fluctuating neurological course which has been found to be related to low sugar as well as with sugar being normal. This morning when examined by the nurse her NIH stroke scale was 1 but during my exam this morning it is 4.  OBJECTIVE Most recent Vital Signs: Filed Vitals:   12/16/11 0300 12/16/11 0400 12/16/11 0500 12/16/11 0600    BP: 107/61 115/57 121/61 109/48  Pulse: 63 59 58 70  Temp:  99 F (37.2 C)    TempSrc:  Oral    Resp: 24 25 23 25   Height:      Weight:  94.6 kg (208 lb 8.9 oz)    SpO2: 97% 96% 97% 96%   CBG (last 3)  Basename 12/16/11 0610 12/16/11 0402 12/16/11 0147  GLUCAP 109* 110* 114*   Intake/Output from previous day: 04/21 0701 - 04/22 0700 In: 2627.5 [P.O.:240; I.V.:2387.5] Out: 2685 [Urine:2685]  IV Fluid Intake:     . sodium chloride 75 mL/hr at 12/16/11 0600  . dextrose 50 mL/hr at 12/16/11 0600   MEDICATIONS    . antiseptic oral rinse  15 mL Mouth Rinse BID  . aspirin  300 mg Rectal Daily   Or  . aspirin  325 mg Oral Daily  . enoxaparin  40 mg Subcutaneous Q24H  . DISCONTD: dextrose  1 ampule Intravenous Once   PRN:  acetaminophen, dextrose, dextrose, dextrose, iohexol, ondansetron (ZOFRAN) IV, DISCONTD: acetaminophen, DISCONTD: dextrose, DISCONTD: senna-docusate  Diet:  Cardiac thin liquids Activity:  Bedrest, Bathroom privileges DVT Prophylaxis:  Lovenox 40 mg sq daily   CLINICALLY SIGNIFICANT STUDIES CBC    Component Value Date/Time   WBC 8.9 12/16/2011 0311   RBC 3.69* 12/16/2011 0311   HGB 11.2* 12/16/2011 0311   HCT 34.7* 12/16/2011 0311   PLT 261 12/16/2011 0311   MCV 94.0 12/16/2011 0311   MCH 30.4 12/16/2011 0311   MCHC 32.3 12/16/2011 0311   RDW 14.4 12/16/2011  0311   LYMPHSABS 1.7 12/15/2011 0900   MONOABS 0.6 12/15/2011 0900   EOSABS 0.1 12/15/2011 0900   BASOSABS 0.0 12/15/2011 0900   CMP    Component Value Date/Time   NA 136 12/16/2011 0311   K 3.4* 12/16/2011 0311   CL 102 12/16/2011 0311   CO2 26 12/16/2011 0311   GLUCOSE 107* 12/16/2011 0311   BUN 7 12/16/2011 0311   CREATININE 0.65 12/16/2011 0311   CALCIUM 8.6 12/16/2011 0311   PROT 6.3 12/15/2011 0900   ALBUMIN 2.7* 12/15/2011 0900   AST 33 12/15/2011 0900   ALT 25 12/15/2011 0900   ALKPHOS 54 12/15/2011 0900   BILITOT 0.4 12/15/2011 0900   GFRNONAA >90 12/16/2011 0311   GFRAA >90 12/16/2011 0311    COAGS No results found for this basename: INR, PROTIME   Lipid Panel    Component Value Date/Time   CHOL 134 12/16/2011 0311   TRIG 90 12/16/2011 0311   HDL 45 12/16/2011 0311   CHOLHDL 3.0 12/16/2011 0311   VLDL 18 12/16/2011 0311   LDLCALC 71 12/16/2011 0311   HgbA1C  Lab Results  Component Value Date   HGBA1C 5.1 12/15/2011   Cardiac Panel (last 3 results)  Basename 12/16/11 0311 12/15/11 2005 12/15/11 1252  CKTOTAL 349* 417* 407*  CKMB 7.9* 10.6* 11.0*  TROPONINI 0.98* 0.65* 1.41*  RELINDX 2.3 2.5 2.7*   Urinalysis    Component Value Date/Time   COLORURINE YELLOW 12/14/2011 1255   APPEARANCEUR CLEAR 12/14/2011 1255   LABSPEC 1.015 12/14/2011 1255   PHURINE 7.5 12/14/2011 1255   GLUCOSEU 250* 12/14/2011 1255   HGBUR NEGATIVE 12/14/2011 1255   BILIRUBINUR NEGATIVE 12/14/2011 1255   KETONESUR NEGATIVE 12/14/2011 1255   PROTEINUR NEGATIVE 12/14/2011 1255   UROBILINOGEN 0.2 12/14/2011 1255   NITRITE NEGATIVE 12/14/2011 1255   LEUKOCYTESUR NEGATIVE 12/14/2011 1255   Urine Drug Screen  No results found for this basename: labopia, cocainscrnur, labbenz, amphetmu, thcu, labbarb    Alcohol Level No results found for this basename: eth   VITAMIN B12     Status: Normal   Collection Time   12/15/11  9:00 AM      Component Value Range   Vitamin B-12 287  211 - 911 (pg/mL)  IRON AND TIBC     Status: Abnormal   Collection Time   12/15/11  9:00 AM      Component Value Range   Iron 39 (*) 42 - 135 (ug/dL)   TIBC 454 (*) 098 - 119 (ug/dL)   Saturation Ratios 17 (*) 20 - 55 (%)   UIBC 192  125 - 400 (ug/dL)  FERRITIN     Status: Normal   Collection Time   12/15/11  9:00 AM      Component Value Range   Ferritin 200  10 - 291 (ng/mL)  RETICULOCYTES     Status: Normal   Collection Time   12/15/11  9:00 AM      Component Value Range   Retic Ct Pct 2.9  0.4 - 3.1 (%)   RBC. 3.97  3.87 - 5.11 (MIL/uL)   Retic Count, Manual 115.1  19.0 - 186.0 (K/uL)  PRO B NATRIURETIC PEPTIDE      Status: Abnormal   Collection Time   12/15/11  9:00 AM      Component Value Range   Pro B Natriuretic peptide (BNP) 441.6 (*) 0 - 125 (pg/mL)   CT of the brain   12/15/2011  Developing cytotoxic edema  correlates with the observed pattern of restricted diffusion on MR, representing acute infarction. This primarily involves the left basal ganglia.  Smaller areas of cortical and subcortical infarction seen on MRI in the left hemisphere are too small to be easily visible on CT.   No incipient uncal or transtentorial herniation, although minimal 1 mm left to right shift may be present.  No hemorrhagic transformation.    12/14/2011  Normal CT of the head without contrast.   CT Angio Head 12/16/2011  Acute non hemorrhagic infarct left basal ganglia, left uncus and left corona radiata.  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  Collateral flow to the left anterior cerebral artery and left middle cerebral artery branches.  The flow within the left middle cerebral artery branches is diminished suggesting that collateral flow is not adequate.   CT Angio Neck 12/16/2011  Abnormal appearance of the left internal carotid artery with appearance of dissection with complete occlusion at the C2 level.  Abnormal appearance of the proximal left common carotid artery. Question if this is related to artifact versus dissection versus atherosclerotic type changes.  Mild narrowing proximal right common carotid artery.  Prominent ectasia of the proximal right common carotid artery with fold and mild narrowing.  Mild irregularity at the right carotid bifurcation without measurable stenosis.  Mild to moderate narrowing proximal right vertebral artery.  Mild irregularity throughout the course of the right vertebral artery.  Left vertebral artery arises directly from the aortic arch. Mild narrowing proximal left vertebral artery.  Evaluation of portions of the proximal left vertebral artery is limited by artifact.  Nonspecific  parenchymal changes lung apices greater on the right. Findings can be evaluated with a formal chest CT.   MRI of the brain  12/14/2011  Moderate sized acute left hemisphere infarct which appears to be secondary to an acute left internal carotid artery occlusion. No visible hemorrhage or midline shift.  Emboli to the left middle cerebral artery and its branches is not excluded.  MRA of the brain  See CT angio  2D Echocardiogram    Carotid Doppler  Right: no ICA stenosis. Left: ICA appears occluded. Bilateral vertebral artery flow is antegrade.  CXR  12/14/2011   Cardiomegaly and bilateral airspace disease most consistent with pulmonary edema.     EKG  normal sinus rhythm.   Physical Exam   Pleasant middle-age [place] American lady currently not in distress.Awake alert. Afebrile. Head is nontraumatic. Neck is supple without bruit. Hearing is normal. Cardiac exam no murmur or gallop. Lungs are clear to auscultation. Distal pulses are well felt.  Neurological Exam ; awake alert moderate expressive aphasia with word finding difficulties and paraphasias. Good comprehension. Able to follow commands well. Extraocular movements are full range without nystagmus. No visual field defect to bedside confrontational testing. Mild right lower facial weakness. Tongue is midline. Motor system exam reveals mild right upper extremity drift. Weakness in the right grip and intrinsic hand muscles. Good right lower extremity strength without drift. The minimum right hip flexion and ankle dorsiflexion weakness. Sensation slightly diminished on the right compared to the left. Coordination is slow but I correct. Gait was not tested.  NIH stroke scale score 4  ASSESSMENT Ms. LETIA GUIDRY is a 61 y.o. female with a left anterior circulation infarct with cerebral edema secondary to occluded L ICA/dissection. On no antiplatelets prior to admission. Now on aspirin 325 mg orally every day for secondary stroke prevention.  Patient with resultant right hemiparesis and expressive aphasia with  fluctuating neurological findings.  -tachycardia -hypoglycemia, recurrent, ? Etiology, ? Insulinoma - Nonspecific parenchymal changes lung apices greater on the right. Radiology recommends CT chest. Will defer to CCM -elevated cardiac enzymes, possibly cardiac related. May need TEE if 2D unrevealing. -obesity, class 2, Body mass index is 36.94 kg/(m^2).   Hospital day # 2  TREATMENT/PLAN -Continue aspirin 325 mg orally every day for secondary stroke prevention. -TEE Tues. Have consulted [facility]. -? CT chest per CCM -neuro continues to wax and wane. Recommend keeping flat as much as possible. -Aspirin plus Plavix for secondary stroke prevention for 3 months at least followed by Plavix alone. Discussed with Dr. Tyson Alias. Need to evaluate for possible insulinoma given recurrent hypoglycemic episodes in a nondiabetic patient. Joaquin Music, ANP-BC, GNP-BC [facility] Pager: [phone] 12/16/2011 7:41 AM  Dr. Delia Heady, Stroke Center Medical Director, has personally reviewed chart, pertinent data, examined the patient and developed the plan of care.

## 2011-12-16 NOTE — Progress Notes (Signed)
*  PRELIMINARY RESULTS* Echocardiogram 2D Echocardiogram has been performed.  Courtney Faulkner 12/16/2011, 11:31 AM

## 2011-12-16 NOTE — Progress Notes (Signed)
ANTIBIOTIC CONSULT NOTE - INITIAL  Pharmacy Consult for Vancomycin + Rocephin Indication: Empiric Endocarditis coverage  No Known Allergies  Patient Measurements: Height: 5\' 3"  (160 cm) Weight: 208 lb 8.9 oz (94.6 kg) IBW/kg (Calculated) : 52.4    Vital Signs: Temp: 99.6 F (37.6 C) (04/22 1538) Temp src: Oral (04/22 1538) BP: 134/85 mmHg (04/22 1700) Pulse Rate: 64  (04/22 1700) Intake/Output from previous day: 04/21 0701 - 04/22 0700 In: 2752.5 [P.O.:240; I.V.:2512.5] Out: 2685 [Urine:2685] Intake/Output from this shift: Total I/O In: 1250 [I.V.:1250] Out: 1345 [Urine:1345]  Labs:  Basename 12/16/11 1645 12/16/11 1103 12/16/11 0635 12/16/11 0311 12/15/11 0900 12/14/11 1742  WBC -- -- -- 8.9 7.6 10.5  HGB -- -- -- 11.2* 12.2 11.3*  PLT -- -- -- 261 306 268  LABCREA -- -- -- -- -- --  CREATININE 0.68 0.68 0.66 -- -- --   Estimated Creatinine Clearance: 82.8 ml/min (by C-G formula based on Cr of 0.68). No results found for this basename: VANCOTROUGH:2,VANCOPEAK:2,VANCORANDOM:2,GENTTROUGH:2,GENTPEAK:2,GENTRANDOM:2,TOBRATROUGH:2,TOBRAPEAK:2,TOBRARND:2,AMIKACINPEAK:2,AMIKACINTROU:2,AMIKACIN:2, in the last 72 hours   Microbiology: Recent Results (from the past 720 hour(s))  CULTURE, BLOOD (ROUTINE X 2)     Status: Normal (Preliminary result)   Collection Time   12/14/11  4:14 PM      Component Value Range Status Comment   Specimen Description BLOOD ARM RIGHT   Final    Special Requests BOTTLES DRAWN AEROBIC AND ANAEROBIC 10CC   Final    Culture  Setup Time [id number]   Final    Culture     Final    Value:        BLOOD CULTURE RECEIVED NO GROWTH TO DATE CULTURE WILL BE HELD FOR 5 DAYS BEFORE ISSUING A FINAL NEGATIVE REPORT   Report Status PENDING   Incomplete   CULTURE, BLOOD (ROUTINE X 2)     Status: Normal (Preliminary result)   Collection Time   12/14/11  5:45 PM      Component Value Range Status Comment   Specimen Description BLOOD RIGHT ARM   Final    Special  Requests BOTTLES DRAWN AEROBIC AND ANAEROBIC 10CC   Final    Culture  Setup Time [id number]   Final    Culture     Final    Value:        BLOOD CULTURE RECEIVED NO GROWTH TO DATE CULTURE WILL BE HELD FOR 5 DAYS BEFORE ISSUING A FINAL NEGATIVE REPORT   Report Status PENDING   Incomplete   MRSA PCR SCREENING     Status: Normal   Collection Time   12/14/11  5:52 PM      Component Value Range Status Comment   MRSA by PCR NEGATIVE  NEGATIVE  Final     Medical History: Past Medical History  Diagnosis Date  . Recurrent upper respiratory infection (URI)   . No pertinent past medical history     Assessment: 61 y.o. 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 Blood cultures drawn on 4/20 are NGTD -- these are the only blood cultures drawn thus far. Discussed getting more blood cultures with CCM prior to abx however they wanted to just start antibiotics at this time. Wt: 94.6 kg, SCr 0.68, CrCl~80-85 ml/min. (Estimated Vanc kinetics: ke~0.073, Vd~68.1, Cmax(8)~29.17, MD~900 mg IV every 8 hours)  Goal of Therapy:  Vancomycin trough level 15-20 mcg/ml  Plan:  1. Vancomycin 1g IV every 8 hours 2. Rocephin 2g IV every 24 hours 3. Will continue to follow renal function, culture results, LOT, trough at steady state, and antibiotic de-escalation plans   Georgina Pillion, PharmD, BCPS Clinical Pharmacist Pager: [phone] 12/16/2011 6:21 PM

## 2011-12-16 NOTE — Evaluation (Signed)
Speech Language Pathology Evaluation Patient Details Name: Courtney Faulkner MRN: [id number] DOB: April 05, 1951 Today's Date: 12/16/2011  HPI:  61 yr old admitted with slurred speech and left side weakness.  CT revealed right MCA and basal ganglia infarct.  RN also reports 100% occluded carotid artery.  Problem List:  Patient Active Problem List  Diagnoses  . Encephalopathy acute  . Hypoglycemia  . Pulmonary edema  . Hypokalemia  . Leukocytosis  . Anemia   Past Medical History:  Past Medical History  Diagnosis Date  . Recurrent upper respiratory infection (URI)   . No pertinent past medical history    Past Surgical History: History reviewed. No pertinent past surgical history.  SLP Assessment/Plan/Recommendation Assessment Clinical Impression Statement: Pt. demonstrated both cognitive and linguistic impairments specifically in the areas of working memory, awareness,problem solving, and attention.  Language deficits include neologisms, phonemic and semantic paraphasias during expression and comprehension deficits of longer and more complex information.  She would benefit from ST on acute venue and inpatient rehab unit. SLP Recommendation/Assessment: Patient needs continued Speech Lanaguage Pathology Services Problem List: Auditory comprehension;Verbal expression;Attention;Memory;Problem Solving;Reasoning Therapy Diagnosis: Cognitive Impairments (cognitive-linguistic deficits) Plan Speech Therapy Frequency: min 2x/week Duration: 2 weeks Treatment/Interventions: Language facilitation;Environmental controls;Cueing hierarchy;Cognitive reorganization;Internal/external aids;Functional tasks;SLP instruction and feedback;Compensatory strategies;Patient/family education Potential to Achieve Goals: Good Potential Considerations: Co-morbidities SLP Recommendations Recommendations for Other Services: Rehab consult Follow up Recommendations:  (TBD) Equipment Recommended: None recommended by  SLP Individuals Consulted Consulted and Agree with Results and Recommendations: Patient  SLP Goals  SLP Goals Potential to Achieve Goals: Good Potential Considerations: Co-morbidities Progress/Goals/Alternative treatment plan discussed with pt/caregiver and they: Agree SLP Goal #1: Pt. will recognize and verbalize errors during verbal expression with min question cues to increase emergent awareness. SLP Goal #2: Pt. will demonstrate appropriate problem solving abilites during basic tasks with min verbal cues SLP Goal #3: Pt. will follow multi-step directions during activities with min verbal cues.  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[employer])  Cognition Overall Cognitive Status: Impaired Arousal/Alertness: Awake/alert Orientation Level: Oriented X4 Attention: Sustained Sustained Attention: Impaired Sustained Attention Impairment: Verbal basic Memory: Impaired Memory Impairment: Decreased short term memory Decreased Short Term Memory: Verbal basic Awareness: Impaired Awareness Impairment: Emergent impairment;Intellectual impairment;Anticipatory impairment Problem Solving: Impaired Problem Solving Impairment: Verbal basic Safety/Judgment: Impaired  Comprehension Auditory Comprehension Overall Auditory Comprehension: Impaired Yes/No Questions: Within Functional Limits Commands: Impaired Multistep Basic Commands: 75-100% accurate Visual Recognition/Discrimination Discrimination: Not tested Reading Comprehension Reading Status: Not tested (Pt. complains of blurry vision when reading)  Expression Expression Primary Mode of Expression: Verbal Verbal Expression Overall Verbal Expression: Impaired Initiation: No impairment Level of Generative/Spontaneous Verbalization: Conversation Repetition:  (NT) Naming:  Impairment Responsive: Not tested Confrontation: Not tested Convergent: Not tested Divergent: 50-74% accurate Verbal Errors: Neologisms;Phonemic paraphasias;Not aware of errors Pragmatics: No impairment Non-Verbal Means of Communication: Not applicable Written Expression Dominant Hand: Right Written Expression: Not tested  Oral/Motor Oral Motor/Sensory Function Overall Oral Motor/Sensory Function: Appears within functional limits for tasks assessed Motor Speech Overall Motor Speech: Appears within functional limits for tasks assessed Respiration: Within functional limits Phonation: Normal Resonance: Within functional limits Articulation: Within functional limitis Intelligibility: Intelligible Motor Planning: Witnin functional limits  [employer] M.Ed [employer] [phone]  12/16/2011

## 2011-12-16 NOTE — Progress Notes (Signed)
UR Completed.  Courtney Faulkner [name] [phone] 12/16/2011  

## 2011-12-17 ENCOUNTER — Inpatient Hospital Stay (HOSPITAL_COMMUNITY): Payer: BC Managed Care – PPO

## 2011-12-17 ENCOUNTER — Encounter (HOSPITAL_COMMUNITY): Payer: Self-pay | Admitting: Physician Assistant

## 2011-12-17 DIAGNOSIS — R222 Localized swelling, mass and lump, trunk: Secondary | ICD-10-CM

## 2011-12-17 DIAGNOSIS — I6789 Other cerebrovascular disease: Secondary | ICD-10-CM

## 2011-12-17 DIAGNOSIS — D151 Benign neoplasm of heart: Secondary | ICD-10-CM

## 2011-12-17 DIAGNOSIS — G9340 Encephalopathy, unspecified: Secondary | ICD-10-CM

## 2011-12-17 HISTORY — DX: Benign neoplasm of heart: D15.1

## 2011-12-17 LAB — DIFFERENTIAL
Basophils Absolute: 0 10*3/uL (ref 0.0–0.1)
Basophils Relative: 0 % (ref 0–1)
Eosinophils Absolute: 0.3 10*3/uL (ref 0.0–0.7)
Monocytes Absolute: 1.1 10*3/uL — ABNORMAL HIGH (ref 0.1–1.0)
Monocytes Relative: 12 % (ref 3–12)
Neutro Abs: 5.7 10*3/uL (ref 1.7–7.7)
Neutrophils Relative %: 62 % (ref 43–77)

## 2011-12-17 LAB — GLUCOSE, CAPILLARY
Glucose-Capillary: 96 mg/dL (ref 70–99)
Glucose-Capillary: 107 mg/dL — ABNORMAL HIGH (ref 70–99)
Glucose-Capillary: 104 mg/dL — ABNORMAL HIGH (ref 70–99)
Glucose-Capillary: 86 mg/dL (ref 70–99)
Glucose-Capillary: 111 mg/dL — ABNORMAL HIGH (ref 70–99)
Glucose-Capillary: 107 mg/dL — ABNORMAL HIGH (ref 70–99)

## 2011-12-17 LAB — BASIC METABOLIC PANEL
BUN: 6 mg/dL (ref 6–23)
BUN: 6 mg/dL (ref 6–23)
BUN: 6 mg/dL (ref 6–23)
BUN: 10 mg/dL (ref 6–23)
CO2: 24 mEq/L (ref 19–32)
CO2: 26 mEq/L (ref 19–32)
CO2: 26 mEq/L (ref 19–32)
Calcium: 8.8 mg/dL (ref 8.4–10.5)
Calcium: 9.1 mg/dL (ref 8.4–10.5)
Calcium: 9 mg/dL (ref 8.4–10.5)
Chloride: 102 mEq/L (ref 96–112)
Chloride: 99 mEq/L (ref 96–112)
Creatinine, Ser: 0.61 mg/dL (ref 0.50–1.10)
Creatinine, Ser: 0.78 mg/dL (ref 0.50–1.10)
GFR calc Af Amer: >90 mL/min (ref >90)
GFR calc Af Amer: >90 mL/min (ref >90)
GFR calc non Af Amer: >90 mL/min (ref >90)
GFR calc non Af Amer: >90 mL/min (ref >90)
GFR calc non Af Amer: >90 mL/min (ref >90)
Glucose, Bld: 96 mg/dL (ref 70–99)
Glucose, Bld: 106 mg/dL — ABNORMAL HIGH (ref 70–99)
Glucose, Bld: 109 mg/dL — ABNORMAL HIGH (ref 70–99)
Glucose, Bld: 108 mg/dL — ABNORMAL HIGH (ref 70–99)
Potassium: 3.8 mEq/L (ref 3.5–5.1)
Potassium: 3.4 mEq/L — ABNORMAL LOW (ref 3.5–5.1)
Sodium: 135 mEq/L (ref 135–145)

## 2011-12-17 LAB — CBC
HCT: 35.3 % — ABNORMAL LOW (ref 36.0–46.0)
Hemoglobin: 11.4 g/dL — ABNORMAL LOW (ref 12.0–15.0)
MCH: 30.6 pg (ref 26.0–34.0)
MCHC: 32.3 g/dL (ref 30.0–36.0)
RDW: 14.2 % (ref 11.5–15.5)

## 2011-12-17 LAB — COMPREHENSIVE METABOLIC PANEL
ALT: 19 U/L (ref 0–35)
AST: 24 U/L (ref 0–37)
Alkaline Phosphatase: 50 U/L (ref 39–117)
CO2: 25 mEq/L (ref 19–32)
GFR calc Af Amer: >90 mL/min (ref >90)
GFR calc non Af Amer: >90 mL/min (ref >90)
Glucose, Bld: 101 mg/dL — ABNORMAL HIGH (ref 70–99)
Potassium: 3.8 mEq/L (ref 3.5–5.1)
Sodium: 136 mEq/L (ref 135–145)

## 2011-12-17 LAB — C4 COMPLEMENT: Complement C4, Body Fluid: 51 mg/dL — ABNORMAL HIGH (ref 10–40)

## 2011-12-17 LAB — C-REACTIVE PROTEIN: CRP: 4.85 mg/dL — ABNORMAL HIGH (ref <0.60)

## 2011-12-17 MED ORDER — MIDAZOLAM HCL 2 MG/2ML IJ SOLN
2.0000 mg | Freq: Once | INTRAMUSCULAR | Status: AC
  Administered 2011-12-17: 2 mg via INTRAVENOUS

## 2011-12-17 MED ORDER — FUROSEMIDE 10 MG/ML IJ SOLN: 40.0000 mg | Freq: Two times a day (BID) | INTRAMUSCULAR | Status: DC

## 2011-12-17 MED ORDER — ASPIRIN 81 MG PO CHEW
324.0000 mg | CHEWABLE_TABLET | ORAL | Status: AC
  Administered 2011-12-18: 324 mg via ORAL
  Filled 2011-12-17: qty 4

## 2011-12-17 MED ORDER — SODIUM CHLORIDE 0.9 % IJ SOLN
3.0000 mL | Freq: Two times a day (BID) | INTRAMUSCULAR | Status: DC
  Administered 2011-12-17 – 2011-12-18 (×2): 3 mL via INTRAVENOUS

## 2011-12-17 MED ORDER — SODIUM CHLORIDE 0.9 % IV SOLN: 250.0000 mL | INTRAVENOUS | Status: DC | PRN

## 2011-12-17 MED ORDER — SODIUM CHLORIDE 0.9 % IV SOLN: 1.0000 mL/kg/h | INTRAVENOUS | Status: DC

## 2011-12-17 MED ORDER — FENTANYL CITRATE 0.05 MG/ML IJ SOLN
25.0000 ug | Freq: Once | INTRAMUSCULAR | Status: AC
  Administered 2011-12-17: 25 ug via INTRAVENOUS

## 2011-12-17 MED ORDER — SODIUM CHLORIDE 0.9 % IJ SOLN: 3.0000 mL | INTRAMUSCULAR | Status: DC | PRN

## 2011-12-17 MED ORDER — FUROSEMIDE 10 MG/ML IJ SOLN
40.0000 mg | Freq: Two times a day (BID) | INTRAMUSCULAR | Status: AC
  Administered 2011-12-17: 40 mg via INTRAVENOUS
  Filled 2011-12-17: qty 4

## 2011-12-17 MED ORDER — MIDAZOLAM HCL 2 MG/2ML IJ SOLN
INTRAMUSCULAR | Status: AC
  Administered 2011-12-17: 2 mg
  Filled 2011-12-17: qty 4

## 2011-12-17 MED ORDER — FENTANYL CITRATE 0.05 MG/ML IJ SOLN
INTRAMUSCULAR | Status: AC
  Administered 2011-12-17: 100 ug
  Filled 2011-12-17: qty 8

## 2011-12-17 NOTE — Consult Note (Signed)
Name: ZURIEL YEAMAN MRN: [id number] DOB: 05/22/1951    LOS: 3 Requesting MD:  Sharon Seller Regarding: Stoke/hypoglycemia   CONSULT NOTE  History of Present Illness: 61 yo AAF , sociology teacher at [employer], non smoker/drinker/no PMH , who presented 4/20 with garbled speech, hypoglycemia.  MRI revealed moderate lt hemispheric infarct. 4/21 she again had hypogylcemia and change in speech pattern. She was moved to NSICU and PCCM asked to assume her care. She is being followed by stroke team. 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 Abnormal appearance of the proximal left common carotid artery. Question if this is related to artifact versus dissection versus atherosclerotic type changes. Other mild narrowing throughout Mild to moderate narrowing proximal right vertebral artery.. Nonspecific parenchymal changes lung apices greater on the right. Findings can be evaluated with a formal chest CT.  Acute non hemorrhagic infarct left basal ganglia, left uncus and left corona radiata.Left internal carotid artery is occluded. Collateral flow to the left anterior cerebral artery and left middle cerebral artery branches. The flow within the left middle cerebral artery branches<BR>is diminished suggesting that collateral flow is not adequate  4/21 ct head>>>Developing cytotoxic edema correlates with the observed pattern of restricted diffusion on MR, representing acute infarction. This primarily involves the left basal ganglia. Smaller areas of cortical and subcortical  infarction seen on MRI in the left hemisphere are too small to be easily visible on CT. No incipient uncal or transtentorial herniation, although minimal 1 mm left to right shift may be present. No hemorrhagic transformation.  4/21 tx to nsicu 4/22- unofficial left atrial mass / clot? 4/22- echo- The estimated ejection fraction was in the range of 60% to 65%. Wall motion was normal; there were no regional wall motion abnormalities. Doppler parameters are consistent with abnormal left ventricular relaxation (grade 1 diastolic dysfunction). - Left atrium: The atrium was mildly dilated. There was a mass. There was a medium-sized, 22mm (L) x 18mm (W)masson the left side of the interatrial septum; the appearance is consistent with myxoma. 4/22- CVTS called 4/23- improved neurostatus  Subjective: Echo perfromed Vital Signs: Temp:  [99.2 F (37.3 C)-99.6 F (37.6 C)] 99.2 F (37.3 C) (04/23 0800) Pulse Rate:  [41-141] 67  (04/23 0800) Resp:  [23-30] 29  (04/23 0800) BP: (104-141)/(47-85) 110/64 mmHg (04/23 0800) SpO2:  [82 %-100 %] 96 % (04/23 0800) Weight:  [92.2 kg (203 lb 4.2 oz)] 92.2 kg (203 lb 4.2 oz) (04/23 0600) I/O last 3 completed shifts: In: 4950 [I.V.:4500; IV Piggyback:450] Out: 4520 [Urine:4520]  Physical Examination: General:  WNWD NAD, flat affect Neuro:  follows commands , improved strength HEENT:  No jvd Cardiovascular:  s1 s2 rrr no r Lungs:  cta Abdomen:  +bs obese, NT ND Musculoskeletal:  intact Skin:  warm  Labs and Imaging:  Ct Angio Head W/cm &/or Wo Cm  12/16/2011  *RADIOLOGY REPORT*  Clinical Data:  Left hemispheric infarct.   Right-sided weakness.  CT ANGIOGRAPHY HEAD AND NECK  Technique:  Multidetector CT imaging of the head and neck was performed using the standard protocol during bolus administration of intravenous contrast.  Multiplanar CT image reconstructions including MIPs were obtained to evaluate the vascular anatomy. Carotid stenosis  measurements (when applicable) are obtained utilizing NASCET criteria, using the distal internal carotid diameter as the denominator.  Contrast: 50mL OMNIPAQUE IOHEXOL 350 MG/ML SOLN  Comparison:  12/14/2011 MR brain and head CT.  CTA NECK  Findings:  Abnormal appearance of the left internal carotid artery with appearance of dissection with complete occlusion at the C2 level.  Abnormal appearance of the proximal left common carotid artery. Question if this is related to artifact versus dissection versus atherosclerotic type changes.  Common origin of the right innominate artery and left common carotid artery.  Mild narrowing proximal right common carotid artery.  Prominent ectasia of the proximal right common carotid artery with fold and mild narrowing.  Mild irregularity at the right carotid bifurcation without measurable stenosis.  Mild to moderate narrowing proximal right vertebral artery.  Mild irregularity throughout the course of the right vertebral artery.  Left vertebral artery arises directly from the aortic arch. Mild narrowing proximal left vertebral artery.  Evaluation of portions of the proximal left vertebral artery is limited by artifact.  Nonspecific parenchymal changes lung apices greater on the right. Prominent preaortic lymph node.  Findings can be evaluated with a formal chest CT.  Mild cervical spondylotic changes.   Review of the MIP images confirms the above findings.  IMPRESSION: Abnormal appearance of the left internal carotid artery with appearance of dissection with complete occlusion at the C2 level.  Abnormal appearance of the proximal left common carotid artery. Question if this is related to artifact versus dissection versus atherosclerotic type changes.  Mild narrowing proximal right common carotid artery.  Prominent ectasia of the proximal right common carotid artery with fold and mild narrowing.  Mild irregularity at the right carotid bifurcation without measurable stenosis.  Mild  to moderate narrowing proximal right vertebral artery.  Mild irregularity throughout the course of the right vertebral artery.  Left vertebral artery arises directly from the aortic arch. Mild narrowing proximal left vertebral artery.  Evaluation of portions of the proximal left vertebral artery is limited by artifact.  Nonspecific parenchymal changes lung apices greater on the right. Findings can be evaluated with a formal chest CT.  CTA HEAD  Findings:  Acute non hemorrhagic infarct left basal ganglia, left uncus and left corona radiata.  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  Collateral flow to the left anterior cerebral artery and left middle cerebral artery branches.  The flow within the left middle cerebral artery branches is diminished suggesting that collateral flow is not adequate. Non filling of the left carotid terminus and majority of the proximal M1 segment of the left middle cerebral artery.  Mild irregularity vertebral arteries bilaterally.  Irregularity with mild narrowing basilar artery.  No aneurysm or vascular malformation detected.  No intracranial mass or abnormal enhancement.  Cerebellar tonsils minimally low-lying but within range normal limits.   Review of the MIP images confirms the above findings.  IMPRESSION: Acute non hemorrhagic infarct left basal ganglia, left uncus and left corona radiata.  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  Collateral flow to the left anterior cerebral artery and left middle cerebral artery branches.  The flow within the left middle cerebral artery branches is diminished suggesting that collateral flow is not adequate.  Initial interpretation at the time of imaging by Dr. Benard Rink with preliminary result rendered  (attempted contact of ordering physcian unsuscessful).  Original Report Authenticated By: Fuller Canada, M.D.   Ct Head Wo Contrast  12/15/2011  *RADIOLOGY REPORT*  Clinical Data: Worsening symptoms.  Evaluate for hemorrhagic transformation.   CT HEAD WITHOUT CONTRAST  Technique:  Contiguous axial images were obtained from the base of the skull through the vertex without contrast.  Comparison: CT head 12/14/2011.  MR head 12/14/2011.  Findings: Interval change in the appearance of the CT scan compared with yesterday, now with developing cytotoxic edema representing acute infarction correlating with the observed pattern of abnormal restricted diffusion on MR.  This cytotoxic edema involves the primarily the basal ganglia structures on the left.  No evidence for hemorrhagic transformation. Equivocal 1 mm left to right shift. There is preservation of the perimesencephalic and suprasellar cistern CSF spaces, with no evidence for uncal herniation. Symmetric sylvian fissures noted.  No hydrocephalus.  No extra- axial fluid collection or mass lesion.  IMPRESSION: Developing cytotoxic edema correlates with the observed pattern of restricted diffusion on MR, representing acute infarction. This primarily involves the left basal ganglia.  Smaller areas of cortical and subcortical infarction seen on MRI in the left hemisphere are too small to be easily visible on CT.   No incipient uncal or transtentorial herniation, although minimal 1 mm left to right shift may be present.  No hemorrhagic transformation.  Original Report Authenticated By: Elsie Stain, M.D.   Ct Angio Neck W/cm &/or Wo/cm  12/16/2011  *RADIOLOGY REPORT*  Clinical Data:  Left hemispheric infarct.   Right-sided weakness.  CT ANGIOGRAPHY HEAD AND NECK  Technique:  Multidetector CT imaging of the head and neck was performed using the standard protocol during bolus administration of intravenous contrast.  Multiplanar CT image reconstructions including MIPs were obtained to evaluate the vascular anatomy. Carotid stenosis measurements (when applicable) are obtained utilizing NASCET criteria, using the distal internal carotid diameter as the denominator.  Contrast: 50mL OMNIPAQUE IOHEXOL 350 MG/ML SOLN   Comparison:  12/14/2011 MR brain and head CT.  CTA NECK  Findings:  Abnormal appearance of the left internal carotid artery with appearance of dissection with complete occlusion at the C2 level.  Abnormal appearance of the proximal left common carotid artery. Question if this is related to artifact versus dissection versus atherosclerotic type changes.  Common origin of the right innominate artery and left common carotid artery.  Mild narrowing proximal right common carotid artery.  Prominent ectasia of the proximal right common carotid artery with fold and mild narrowing.  Mild irregularity at the right carotid bifurcation without measurable stenosis.  Mild to moderate narrowing proximal right vertebral artery.  Mild irregularity throughout the course of the right vertebral artery.  Left vertebral artery arises directly from the aortic arch. Mild narrowing proximal left vertebral artery.  Evaluation of portions of the proximal left vertebral artery is limited by artifact.  Nonspecific parenchymal changes lung apices greater on the right. Prominent preaortic lymph node.  Findings can be evaluated with a formal chest CT.  Mild cervical spondylotic changes.   Review of the MIP images  confirms the above findings.  IMPRESSION: Abnormal appearance of the left internal carotid artery with appearance of dissection with complete occlusion at the C2 level.  Abnormal appearance of the proximal left common carotid artery. Question if this is related to artifact versus dissection versus atherosclerotic type changes.  Mild narrowing proximal right common carotid artery.  Prominent ectasia of the proximal right common carotid artery with fold and mild narrowing.  Mild irregularity at the right carotid bifurcation without measurable stenosis.  Mild to moderate narrowing proximal right vertebral artery.  Mild irregularity throughout the course of the right vertebral artery.  Left vertebral artery arises directly from the aortic  arch. Mild narrowing proximal left vertebral artery.  Evaluation of portions of the proximal left vertebral artery is limited by artifact.  Nonspecific parenchymal changes lung apices greater on the right. Findings can be evaluated with a formal chest CT.  CTA HEAD  Findings:  Acute non hemorrhagic infarct left basal ganglia, left uncus and left corona radiata.  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  Collateral flow to the left anterior cerebral artery and left middle cerebral artery branches.  The flow within the left middle cerebral artery branches is diminished suggesting that collateral flow is not adequate. Non filling of the left carotid terminus and majority of the proximal M1 segment of the left middle cerebral artery.  Mild irregularity vertebral arteries bilaterally.  Irregularity with mild narrowing basilar artery.  No aneurysm or vascular malformation detected.  No intracranial mass or abnormal enhancement.  Cerebellar tonsils minimally low-lying but within range normal limits.   Review of the MIP images confirms the above findings.  IMPRESSION: Acute non hemorrhagic infarct left basal ganglia, left uncus and left corona radiata.  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  Collateral flow to the left anterior cerebral artery and left middle cerebral artery branches.  The flow within the left middle cerebral artery branches is diminished suggesting that collateral flow is not adequate.  Initial interpretation at the time of imaging by Dr. Benard Rink with preliminary result rendered  (attempted contact of ordering physcian unsuscessful).  Original Report Authenticated By: Fuller Canada, M.D.   Dg Chest Port 1 View  12/17/2011  *RADIOLOGY REPORT*  Clinical Data: Assess pulmonary edema.  PORTABLE CHEST - 1 VIEW  Comparison: Chest x-ray 12/14/2011.  Findings: Lung volumes remain low.  There is blunting of both costophrenic sulci, compatible with small bilateral pleural effusions.  There continues to be  some cephalization of the pulmonary vasculature and indistinctness of interstitial markings, which may suggest some mild pulmonary edema.  Additional bibasilar opacities medially are favored to represent areas of subsegmental atelectasis.  Heart size is mildly enlarged (unchanged). The patient is rotated to the right on today's exam, resulting in distortion of the mediastinal contours and reduced diagnostic sensitivity and specificity for mediastinal pathology.  IMPRESSION: 1.  Appearance of chest is similar to the recent prior study, again suggesting a background of mild interstitial pulmonary edema. Overall, aeration has slightly improved, with decreasing small bilateral pleural effusions.  Original Report Authenticated By: Florencia Reasons, M.D.    Lab 12/17/11 0300 12/17/11 0035 12/16/11 1839  NA 136 135 139  K 3.8 3.8 3.6  CL 103 102 105  CO2 25 24 26   BUN 7 7 7   CREATININE 0.65 0.66 0.69  GLUCOSE 101* 100* 103*    Lab 12/17/11 0300 12/16/11 0311 12/15/11 0900  HGB 11.4* 11.2* 12.2  HCT 35.3* 34.7* 38.0  WBC 9.2 8.9 7.6  PLT 240 261 306   ABG No results found for this basename: phart,  pco2,  pco2art,  po2,  po2art,  hco3,  tco2,  acidbasedef,  o2sat    Assessment and Plan: Recurrent hypoglycemia R/o sepsis/ veg on myxoma induced R/o neuro endocrine tumor -continue d10 at 50 cc/hr, cbg 96, reduce rate slight -c-peptic elevated, would be c/w insulinoma, neuroendocrine tumor, await insulin and pro insulin levels -want to image pancrease, CT today likely -want to image chest for lymphad noted , see pulm Now that she needs cath and again contrast , and clinical suspscion lower for primary pancreatic insulinoma, will change CT to NON contrast study  surveys -ca 19-9 - 2.6, low ABX, vanc, ceftriaxone empiric, BC to follow  New lt stroke -per stroke team, improved -heparin in future, d/w stroke, if vegetations also present may be wise to wait, obtain TEE first, if needed could  use heparin systemic as cva not large and hemorrhagic transformation low risk -Blood for cytology sent , will follow -TEE  Clot / mass lEft atrium - likely myxoma Await TEE Appreciate CVTS guidance I have called cards Dr Katrinka Blazing for Cath, likely in am   Hypokalemia replace Recheck in am   PR interval 0.12, assess for WPW ecg for delta for now  R/o pulm edema component, increase rr May need abg Lasix x 2 doses then hold for cath Reduce volume  Best practices / Disposition: -->ICU status under PCCM -->full code -->lovenox for DVT Px -->Protonix not indicated -->diet Update son and husband daily   Mcarthur Rossetti. Tyson Alias, MD, FACP Pgr: [phone] [facility]

## 2011-12-17 NOTE — Progress Notes (Signed)
Stroke Team Progress Note  HISTORY Courtney Faulkner is an 61 y.o. female with dysarthria, aphasia and right sided weakness. According to son at bedside, his father heard her vaccuming at ~08:30 on 12/14/2011. He came down stairs at 11:30 and found her confused and dysarthric. EMS called and found BS 36. Patient was subsequently brought to the ED. She arrive at 1214. In the ED patient was given some orange juice and her blood CBG went to 166. Patient subsequently had a head CT done which was negative. Initially a code stroke was called however it was canceled secondary to patient's hypoglycemic spells. Per family patient was recently been treated for upper respiratory symptoms with prednisone and albuterol. Patient and family deny any recent fevers, no chills, no chest pain, no diarrhea, no constipation, no dysuria, no generalized weakness. Patient's family does endorse some shortness of breath on exertion when patient climbs up stairs which has been ongoing for the past 2 weeks. On exam and during the interview patient has some dysarthric speech is alert to self only and has an incoherent speech as well. After treatment, CBG 166, pt remains confused, and having difficulty following commands.  Neuro consult called by Dr. Janee Morn for this reason. Patient was not a TPA candidate secondary to presenting beyond the tPA window. She was transferred to the ICU after admission for hypoglycemia and neuro decline for further evaluation and treatment.  SUBJECTIVE Patient without complaints. 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 Started on antibiotics per CCM for presumed vegetations superimposed on  Myxoma. TEE pending today and cardiac cath tomorrow. Likely open heart surgery for removal next week.  OBJECTIVE Most recent Vital Signs: Filed Vitals:   12/17/11 0400 12/17/11 0500 12/17/11 0600 12/17/11 0700  BP: 117/60 118/64 118/65 117/65  Pulse: 66 41    Temp: 99.3 F (37.4  C)     TempSrc: Oral     Resp: 28 27 26 27   Height:      Weight:   92.2 kg (203 lb 4.2 oz)   SpO2: 98% 98% 98% 98%   CBG (last 3)  Basename 12/17/11 0153 12/17/11 0001 12/16/11 2235  GLUCAP 101* 105* 107*   Intake/Output from previous day: 04/22 0701 - 04/23 0700 In: 3450 [I.V.:3000; IV Piggyback:450] Out: 3510 [Urine:3510]  IV Fluid Intake:     . sodium chloride 75 mL/hr at 12/17/11 0700  . dextrose 50 mL/hr at 12/17/11 0700   MEDICATIONS    . antiseptic oral rinse  15 mL Mouth Rinse BID  . aspirin  300 mg Rectal Daily   Or  . aspirin  325 mg Oral Daily  . cefTRIAXone (ROCEPHIN)  IV  2 g Intravenous NOW  . cefTRIAXone (ROCEPHIN)  IV  2 g Intravenous Q24H  . enoxaparin  40 mg Subcutaneous Q24H  . potassium chloride  40 mEq Oral Once  . vancomycin  1,000 mg Intravenous NOW  . vancomycin  1,000 mg Intravenous Q8H   PRN:  acetaminophen, dextrose, ondansetron (ZOFRAN) IV  Diet:  NPO for TEE Activity:  Bedrest, Bathroom privileges DVT Prophylaxis:  Lovenox 40 mg sq daily   CLINICALLY SIGNIFICANT STUDIES CBC    Component Value Date/Time   WBC 9.2 12/17/2011 0300   RBC 3.73* 12/17/2011 0300   HGB 11.4* 12/17/2011 0300   HCT 35.3* 12/17/2011 0300   PLT 240 12/17/2011 0300   MCV 94.6 12/17/2011 0300   MCH 30.6 12/17/2011 0300   MCHC 32.3 12/17/2011 0300  RDW 14.2 12/17/2011 0300   LYMPHSABS 2.1 12/17/2011 0300   MONOABS 1.1* 12/17/2011 0300   EOSABS 0.3 12/17/2011 0300   BASOSABS 0.0 12/17/2011 0300   CMP    Component Value Date/Time   NA 136 12/17/2011 0300   K 3.8 12/17/2011 0300   CL 103 12/17/2011 0300   CO2 25 12/17/2011 0300   GLUCOSE 101* 12/17/2011 0300   BUN 7 12/17/2011 0300   CREATININE 0.65 12/17/2011 0300   CALCIUM 8.7 12/17/2011 0300   PROT 5.6* 12/17/2011 0300   ALBUMIN 2.3* 12/17/2011 0300   AST 24 12/17/2011 0300   ALT 19 12/17/2011 0300   ALKPHOS 50 12/17/2011 0300   BILITOT 0.3 12/17/2011 0300   GFRNONAA >90 12/17/2011 0300   GFRAA >90 12/17/2011 0300    COAGS No results found for this basename: INR,  PROTIME   Lipid Panel    Component Value Date/Time   CHOL 134 12/16/2011 0311   TRIG 90 12/16/2011 0311   HDL 45 12/16/2011 0311   CHOLHDL 3.0 12/16/2011 0311   VLDL 18 12/16/2011 0311   LDLCALC 71 12/16/2011 0311   HgbA1C  Lab Results  Component Value Date   HGBA1C 5.1 12/16/2011   Cardiac Panel (last 3 results)   Basename 12/16/11 0311 12/15/11 2005 12/15/11 1252  CKTOTAL 349* 417* 407*  CKMB 7.9* 10.6* 11.0*  TROPONINI 0.98* 0.65* 1.41*  RELINDX 2.3 2.5 2.7*   Urinalysis    Component Value Date/Time   COLORURINE YELLOW 12/14/2011 1255   APPEARANCEUR CLEAR 12/14/2011 1255   LABSPEC 1.015 12/14/2011 1255   PHURINE 7.5 12/14/2011 1255   GLUCOSEU 250* 12/14/2011 1255   HGBUR NEGATIVE 12/14/2011 1255   BILIRUBINUR NEGATIVE 12/14/2011 1255   KETONESUR NEGATIVE 12/14/2011 1255   PROTEINUR NEGATIVE 12/14/2011 1255   UROBILINOGEN 0.2 12/14/2011 1255   NITRITE NEGATIVE 12/14/2011 1255   LEUKOCYTESUR NEGATIVE 12/14/2011 1255   Urine Drug Screen  No results found for this basename: labopia,  cocainscrnur,  labbenz,  amphetmu,  thcu,  labbarb    Alcohol Level No results found for this basename: eth   VITAMIN B12     Status: Normal   Collection Time   12/15/11  9:00 AM      Component Value Range   Vitamin B-12 287  211 - 911 (pg/mL)  IRON AND TIBC     Status: Abnormal   Collection Time   12/15/11  9:00 AM      Component Value Range   Iron 39 (*) 42 - 135 (ug/dL)   TIBC 784 (*) 696 - 295 (ug/dL)   Saturation Ratios 17 (*) 20 - 55 (%)   UIBC 192  125 - 400 (ug/dL)  FERRITIN     Status: Normal   Collection Time   12/15/11  9:00 AM      Component Value Range   Ferritin 200  10 - 291 (ng/mL)  RETICULOCYTES     Status: Normal   Collection Time   12/15/11  9:00 AM      Component Value Range   Retic Ct Pct 2.9  0.4 - 3.1 (%)   RBC. 3.97  3.87 - 5.11 (MIL/uL)   Retic Count, Manual 115.1  19.0 - 186.0 (K/uL)  PRO B NATRIURETIC PEPTIDE      Status: Abnormal   Collection Time   12/15/11  9:00 AM      Component Value Range   Pro B Natriuretic peptide (BNP) 441.6 (*) 0 - 125 (pg/mL)   CT  of the brain   12/15/2011  Developing cytotoxic edema correlates with the observed pattern of restricted diffusion on MR, representing acute infarction. This primarily involves the left basal ganglia.  Smaller areas of cortical and subcortical infarction seen on MRI in the left hemisphere are too small to be easily visible on CT.   No incipient uncal or transtentorial herniation, although minimal 1 mm left to right shift may be present.  No hemorrhagic transformation.    12/14/2011  Normal CT of the head without contrast.   CT Angio Head 12/16/2011  Acute non hemorrhagic infarct left basal ganglia, left uncus and left corona radiata.  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  Collateral flow to the left anterior cerebral artery and left middle cerebral artery branches.  The flow within the left middle cerebral artery branches is diminished suggesting that collateral flow is not adequate.   CT Angio Neck 12/16/2011  Abnormal appearance of the left internal carotid artery with appearance of dissection with complete occlusion at the C2 level.  Abnormal appearance of the proximal left common carotid artery. Question if this is related to artifact versus dissection versus atherosclerotic type changes.  Mild narrowing proximal right common carotid artery.  Prominent ectasia of the proximal right common carotid artery with fold and mild narrowing.  Mild irregularity at the right carotid bifurcation without measurable stenosis.  Mild to moderate narrowing proximal right vertebral artery.  Mild irregularity throughout the course of the right vertebral artery.  Left vertebral artery arises directly from the aortic arch. Mild narrowing proximal left vertebral artery.  Evaluation of portions of the proximal left vertebral artery is limited by artifact.  Nonspecific  parenchymal changes lung apices greater on the right. Findings can be evaluated with a formal chest CT.   MRI of the brain  12/14/2011  Moderate sized acute left hemisphere infarct which appears to be secondary to an acute left internal carotid artery occlusion. No visible hemorrhage or midline shift.  Emboli to the left middle cerebral artery and its branches is not excluded.  MRA of the brain  See CT angio  2D Echocardiogram  EF 60-65% with no source of embolus.  Left atrial mass attached to intra-atrial septum highly suggestive of myxoma.  Carotid Doppler  Right: no ICA stenosis. Left: ICA appears occluded. Bilateral vertebral artery flow is antegrade.  TEE pending today  CXR  12/16/2011  Appearance of chest is similar to the recent prior study, again suggesting a background of mild interstitial pulmonary edema. Overall, aeration has slightly improved, with decreasing small bilateral pleural effusions. 12/14/2011   Cardiomegaly and bilateral airspace disease most consistent with pulmonary edema.     EKG  normal sinus rhythm.   Physical Exam   Pleasant middle-age [place] American lady currently not in distress.Awake alert. Afebrile. Head is nontraumatic. Neck is supple without bruit. Hearing is normal. Cardiac exam no murmur or gallop. Lungs are clear to auscultation. Distal pulses are well felt.  Neurological Exam ; awake alert mild expressive aphasia with word finding difficulties and paraphasias. Good comprehension. Able to follow commands well. Extraocular movements are full range without nystagmus. No visual field defect to bedside confrontational testing. Mild right lower facial weakness. Tongue is midline. Motor system exam reveals mild right upper extremity drift. Weakness in the right grip and intrinsic hand muscles. Good right lower extremity strength without drift. The minimum right hip flexion and ankle dorsiflexion weakness. Sensation slightly diminished on the right compared to the  left. Coordination is slow but  I correct. Gait was not tested.    ASSESSMENT Ms. ATIANA LEVIER is a 61 y.o. female with a left anterior circulation infarct with cerebral edema secondary to occluded L ICA/ ?dissection. On no antiplatelets prior to admission. Now on aspirin 325 mg orally every day for secondary stroke prevention. Patient with resultant right hemiparesis and expressive aphasia with fluctuating neurological findings.  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 Cardiac surgeon consulted. Will need cath clearance prior to OR. -? Endocarditis, on Vanc and rocepehin started 12/16/2011 -tachycardia -hypoglycemia, recurrent, ? Etiology, ? Insulinoma. Remains on D10 - Nonspecific parenchymal changes lung apices greater on the right. Radiology recommends CT chest. Will defer to CCM -elevated cardiac enzymes, possibly cardiac related.  -obesity, class 2, Body mass index is 36.01 kg/(m^2). -short PR  Hospital day # 3  TREATMENT/PLAN -d/c D10 after TEE when patient is taking POs -Continue aspirin 325 mg orally every day for secondary stroke prevention. -May need iv heparin perioperative for cardiac surgery Sasan Wilkie discussed case with Dr. Tyson Alias.   Joaquin Music, ANP-BC, GNP-BC [facility] Pager: [phone] 12/17/2011 7:50 AM  Dr. Delia Heady, Stroke Center Medical Director, has personally reviewed chart, pertinent data, examined the patient and developed the plan of care.

## 2011-12-17 NOTE — CV Procedure (Signed)
Transesophageal echocardiogram Indication evaluate left atrial mass. Procedure discussed and consent obtained. Pharynx anesthetized with topical Cetacaine spray. Patient sedated with Versed 2 mg and fentanyl 25 mcg IV.  Omni plane probe inserted without difficulty.  Results-#1 normal LV function and normal wall motion. #2 normal left atrial size and no left atrial appendage thrombus. #3 1.7 x 1.7 cm mass attached to the left atrial septum most likely secondary to left atrial myxoma. #4 normal right atrium and right ventricle. #5 no pericardial effusion #6 mild atherosclerosis in the descending aorta #7 trileaflet aortic valve with no aortic insufficiency. #8 normal mitral valve with trivial mitral regurgitation. #9 normal tricuspid valve with trace tricuspid regurgitation. #10 normal pulmonic valve with trace pulmonic insufficiency. #11 no intra-atrial flow communication by color.

## 2011-12-17 NOTE — Consult Note (Signed)
I have personally reviewed the records, examined the patient, and spoken to the patient and her husband in detail about her clinical condition. I reviewed the transesophageal and transthoracic echo. I have reviewed the CT and MR I scan reports. I have discussed coronary angiography with the patient has a prelude to surgical resection of the left atrial myxoma. I discussed the risk including death, stroke, bleeding, MI, allergy, kidney failure, limb ischemia, among others. The patient's husband is serving as power of attorney. We will plan to do the procedure at around noon on 12/18/2011. Orders have been placed.

## 2011-12-17 NOTE — Consult Note (Signed)
CARDIOLOGY CONSULT NOTE  Patient ID: Courtney Faulkner, MRN: [id number], DOB/AGE: 30-Jul-1951 61 y.o. 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 She also has had fluctuating neuro status with periodic hypoglycemia and is also being worked up for an insulinoma/neuroendocrine tumor. Workup thus far has also showed occluded LICA (?dissection) as well as a left atrial mass on 2D echo felt consistent with myxoma, for which Dr. Tyrone Sage has been consulted and recommends pre-op cath. She has also been started on broad-spectrum IV antibiotics for presumptive endocarditis.She did have a rise in her troponin up to 1.48 but no acute EKG changes. She has been started on ASA 325mg  daily. Lasix 40mg  IV bid was also started for concern for mild pulmonary edema. She is currently waking up from TEE and still has some residual aphasia, but is able to answer brief questions. She denies any CP or SOB either now or outside the hospital. She endorses a recent URI that she went to the ER for tx (prednisone and albuterol prescribed) but otherwise no significant medical history.  Past Medical History  Diagnosis Date  . Upper respiratory tract infection       Most Recent Cardiac Studies: 1. 2D Echo 4/22 Study Conclusions - Left ventricle: The cavity size was normal. Wall thickness was increased in a pattern of mild LVH. Systolic function was normal. The estimated ejection fraction was in the range of 60% to 65%. Wall motion was normal; there were no regional wall motion abnormalities. Doppler parameters are consistent with abnormal left ventricular relaxation (grade 1 diastolic  dysfunction). - Left atrium: The atrium was mildly dilated. There was a mass. There was a medium-sized, 22mm (L) x 18mm (W)mass on the left side of the interatrial septum; the appearance is consistent with myxoma. Impressions: - Left atrial mass attached to intra-atrial septum highly suggestive of myxoma.  2. TEE 12/17/11 Results-#1 normal LV function and normal wall motion.  #2 normal left atrial size and no left atrial appendage thrombus.  #3 1.7 x 1.7 cm mass attached to the left atrial septum most likely secondary to left atrial myxoma.  #4 normal right atrium and right ventricle.  #5 no pericardial effusion  #6 mild atherosclerosis in the descending aorta  #7 trileaflet aortic valve with no aortic insufficiency.  #8 normal mitral valve with trivial mitral regurgitation.  #9 normal tricuspid valve with trace tricuspid regurgitation.  #10 normal pulmonic valve with trace pulmonic insufficiency.  #11 no intra-atrial flow communication by color.   Home Meds: Prior to Admission medications   Medication Sig Start Date End Date Taking? Authorizing Provider  albuterol (PROVENTIL HFA;VENTOLIN HFA) 108 (90 BASE) MCG/ACT inhaler Inhale 2 puffs into the lungs every 6 (six) hours as needed. For wheezing   Yes Historical Provider, MD  predniSONE (DELTASONE) 10 MG tablet Take 10 mg by mouth daily. 12 day pack filled on 12-06-11   Yes Historical Provider, MD    Inpatient Medications:     . antiseptic oral rinse  15 mL Mouth Rinse BID  . aspirin  300 mg Rectal Daily   Or  . aspirin  325 mg Oral Daily  . cefTRIAXone (ROCEPHIN)  IV  2 g  Intravenous NOW  . cefTRIAXone (ROCEPHIN)  IV  2 g Intravenous Q24H  . enoxaparin  40 mg Subcutaneous Q24H  . fentaNYL      . furosemide  40 mg Intravenous Q12H  . midazolam      . potassium chloride  40 mEq Oral Once  . vancomycin  1,000 mg Intravenous NOW  . vancomycin  1,000 mg Intravenous Q8H  . DISCONTD: furosemide  40 mg Intravenous Q12H    Allergies:  No Known Allergies  History   Social History  . Marital Status: Married    Spouse Name: N/A    Number of Children: N/A  . 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 Smoking status: Former Smoker    Quit date: 07/15/2002  . Smokeless tobacco: Never Used  . Alcohol Use: No  . Drug Use: No  . Sexually Active: Yes    Birth Control/ Protection: Post-menopausal   Other Topics Concern  . Not on file   Social History Narrative  . No narrative on file     Family History  Problem Relation Age of Onset  . Coronary artery disease Neg Hx      Review of Systems: limited given aphasia/waking up from TEE General: negative for chills, fever Cardiovascular: negative for chest pain, edema, orthopnea, palpitations, shortness of breath or dyspnea on exertion Dermatological: negative for rash Respiratory:  Recent URI sx for which she received tx Abdominal: negative for nausea, vomiting, diarrhea Neurologic: see above All other systems reviewed and are otherwise negative except as noted above.  Labs:  [facility] 12/16/11 0311 12/15/11 2005 12/15/11 1252 12/15/11 0900  CKTOTAL 349* 417* 407* 429*  CKMB 7.9* 10.6* 11.0* 12.8*  TROPONINI 0.98* 0.65* 1.41* 1.48*   Lab Results  Component Value Date   WBC 9.2 12/17/2011   HGB 11.4* 12/17/2011   HCT 35.3* 12/17/2011   MCV 94.6 12/17/2011   PLT 240 12/17/2011     Lab 12/17/11 0750 12/17/11 0300  NA 136 --  K 3.8 --  CL 103 --  CO2 25 --  BUN 6 --  CREATININE 0.61 --  CALCIUM 8.8 --  PROT -- 5.6*  BILITOT -- 0.3  ALKPHOS -- 50  ALT -- 19  AST -- 24  GLUCOSE 96 --   Lab Results  Component Value Date   CHOL 134 12/16/2011   HDL 45 12/16/2011   LDLCALC 71 12/16/2011   TRIG 90 12/16/2011   Radiology/Studies:  1. Ct Angio Head W/cm &/or Wo Cm 12/16/2011  *RADIOLOGY REPORT*    IMPRESSION: Abnormal appearance of the left internal carotid artery with  appearance of dissection with complete occlusion at the C2 level.  Abnormal appearance of the proximal left common carotid artery. Question if this is related to artifact versus dissection versus atherosclerotic type changes.  Mild narrowing proximal right common carotid artery.  Prominent ectasia of the proximal right common carotid artery with fold and mild narrowing.  Mild irregularity at the right carotid bifurcation without measurable stenosis.  Mild to moderate narrowing proximal right vertebral artery.  Mild irregularity throughout the course of the right vertebral artery.  Left vertebral artery arises directly from the aortic arch. Mild narrowing proximal left vertebral artery.  Evaluation of portions of the proximal left vertebral artery is limited by artifact.  Nonspecific parenchymal changes lung apices greater on the right. Findings can be evaluated with a formal chest CT.  CTA HEAD IMPRESSION: Acute non hemorrhagic infarct left basal ganglia, left uncus and left corona radiata.  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  Collateral flow to the left anterior cerebral artery and left middle cerebral artery branches.  The flow within the left middle cerebral artery branches is diminished suggesting that collateral flow is not adequate.  Initial interpretation at the time of imaging by Dr. Benard Rink with preliminary result rendered  (attempted contact of ordering physcian unsuscessful).  Original Report Authenticated By: Fuller Canada, M.D.   2. Chest 2 View 12/14/2011 IMPRESSION: Cardiomegaly and bilateral airspace disease most consistent with pulmonary edema.  Original Report Authenticated By: Bernadene Bell. D'ALESSIO, M.D.   3. Ct Head Wo Contrast 12/15/2011  *RADIOLOGY REPORT*   IMPRESSION: Developing cytotoxic edema correlates with the observed pattern of restricted diffusion on MR, representing acute infarction. This primarily involves the left basal ganglia.  Smaller areas of cortical and subcortical  infarction seen on MRI in the left hemisphere are too small to be easily visible on CT.   No incipient uncal or transtentorial herniation, although minimal 1 mm left to right shift may be present.  No hemorrhagic transformation.  Original Report Authenticated By: Elsie Stain, M.D.   4. Ct Head Wo Contrast 12/14/2011  *RADIOLOGY REPORT*  IMPRESSION: Normal CT of the head without contrast.  Original Report Authenticated By: Elsie Stain, M.D.   5. Mr Brain Wo Contrast 12/14/2011   IMPRESSION: Moderate sized acute left hemisphere infarct which appears to be secondary to an acute left internal carotid artery occlusion. No visible hemorrhage or midline shift.  Emboli to the left middle cerebral artery and its branches is not excluded. See comments above.  Discussed with Dr. Janee Morn at time of dictation.  Original Report Authenticated By: Elsie Stain, M.D.   6. Dg Chest Port 1 View 12/17/2011 IMPRESSION: 1.  Appearance of chest is similar to the recent prior study, again suggesting a background of mild interstitial pulmonary edema. Overall, aeration has slightly improved, with decreasing small bilateral pleural effusions.  Original Report Authenticated By: Florencia Reasons, M.D.    EKG: NSR 72bpm no acute changes, intervals WNL Tele - NSR thus far  Physical Exam: Blood pressure 110/60, pulse 67, temperature 99.2 F (37.3 C), temperature source Oral, resp. rate 26, height 5\' 3"  (1.6 m), weight 203 lb 4.2 oz (92.2 kg), SpO2 99.00%. General: Well developed, well nourished, in no acute distress,occasionally coughing as she wakes up from TEE Head: Normocephalic, atraumatic, sclera non-icteric, no xanthomas, nares are without discharge.  Neck: Negative for carotid bruits. JVD not elevated. Lungs: Clear bilaterally to auscultation without wheezes, rales, or rhonchi. Breathing is unlabored.   Heart: RRR with S1 S2. No murmurs, rubs, or gallops appreciated. Abdomen: Soft, non-tender, non-distended with  normoactive bowel sounds. No hepatomegaly. No rebound/guarding. No obvious abdominal masses. Msk:  Strength and tone appear normal for age. Extremities: No clubbing or cyanosis. No edema.  Distal pedal pulses are 2+ and equal bilaterally. Neuro: Alert, able to answer questions with mild intermittent expressive aphasia with word finding difficulties and periodic repetition. Some RLE ankle weakness.  Psych: Flat affect.   Assessment and Plan:   1. Acute L brain CVA 2. Periodic hypoglycemia, being worked up for neuroendocrine tumor 3. Occluded L carotid/?possible dissection by CT angio 4. Left atrial mass, possible myxoma 5. Elevated cardiac enzymes of unclear significance, consulted for pre-op cath 6. Mild pulmonary edema by CXR - initiated on IV Lasix by PCCM, follow I+Os  and daily weights  See below for our initial thoughts. May need further assessment of carotid disease prior to cath. It turns out the patient and her family have requested Dr. Garnette Scheuermann be the cathing physician. We have spoken with him and he will assume her care beginning this afternoon.   Signed, Ronie Spies PA-C 12/17/2011, 10:51 AM I agree with assessment above.Cardiac exam is unremarkable on auscultation. No murmur, gallop or rub. She has a significant left atrial mass attached to the intra-atrial septum most likely a myxoma although the frond-like projections would also be consistent with a papillary fibroelastoma which can also present with embolization.  In either case treatment would be the same, which is surgical resection. Cardiac cath and further cardiac care as per Dr. Katrinka Blazing.

## 2011-12-18 ENCOUNTER — Inpatient Hospital Stay (HOSPITAL_COMMUNITY): Payer: BC Managed Care – PPO

## 2011-12-18 ENCOUNTER — Encounter (HOSPITAL_COMMUNITY)
Admission: EM | Disposition: A | Discharge status: Rehabilitation Facility | Payer: Self-pay | Source: Home / Self Care | Attending: Thoracic Surgery (Cardiothoracic Vascular Surgery)

## 2011-12-18 DIAGNOSIS — D487 Neoplasm of uncertain behavior of other specified sites: Secondary | ICD-10-CM

## 2011-12-18 HISTORY — PX: ABDOMINAL ANGIOGRAM: SHX5499

## 2011-12-18 LAB — DIFFERENTIAL
Basophils Absolute: 0.1 10*3/uL (ref 0.0–0.1)
Eosinophils Relative: 3 % (ref 0–5)
Lymphocytes Relative: 22 % (ref 12–46)
Neutro Abs: 5.7 10*3/uL (ref 1.7–7.7)
Neutrophils Relative %: 64 % (ref 43–77)

## 2011-12-18 LAB — GLUCOSE, CAPILLARY
Glucose-Capillary: 110 mg/dL — ABNORMAL HIGH (ref 70–99)
Glucose-Capillary: 76 mg/dL (ref 70–99)
Glucose-Capillary: 49 mg/dL — ABNORMAL LOW (ref 70–99)
Glucose-Capillary: 75 mg/dL (ref 70–99)

## 2011-12-18 LAB — CBC
Platelets: 242 10*3/uL (ref 150–400)
RDW: 14.2 % (ref 11.5–15.5)
WBC: 8.8 10*3/uL (ref 4.0–10.5)

## 2011-12-18 LAB — COMPREHENSIVE METABOLIC PANEL
Alkaline Phosphatase: 54 U/L (ref 39–117)
BUN: 9 mg/dL (ref 6–23)
Chloride: 99 mEq/L (ref 96–112)
GFR calc Af Amer: >90 mL/min (ref >90)
GFR calc non Af Amer: >90 mL/min (ref >90)
Glucose, Bld: 101 mg/dL — ABNORMAL HIGH (ref 70–99)
Potassium: 3.6 mEq/L (ref 3.5–5.1)
Total Bilirubin: 0.4 mg/dL (ref 0.3–1.2)

## 2011-12-18 LAB — MPO/PR-3 (ANCA) ANTIBODIES: Myeloperoxidase Abs: <1 AU/mL (ref <20)

## 2011-12-18 LAB — ANTI-DNA ANTIBODY, DOUBLE-STRANDED: ds DNA Ab: 1 IU/mL (ref <30)

## 2011-12-18 LAB — BASIC METABOLIC PANEL
Chloride: 97 mEq/L (ref 96–112)
Creatinine, Ser: 0.69 mg/dL (ref 0.50–1.10)
GFR calc Af Amer: >90 mL/min (ref >90)
Sodium: 135 mEq/L (ref 135–145)

## 2011-12-18 SURGERY — CORONARY ANGIOGRAM

## 2011-12-18 MED ORDER — NITROGLYCERIN 0.2 MG/ML ON CALL CATH LAB
INTRAVENOUS | Status: AC
  Filled 2011-12-18: qty 1

## 2011-12-18 MED ORDER — LIDOCAINE HCL (PF) 1 % IJ SOLN
INTRAMUSCULAR | Status: AC
  Filled 2011-12-18: qty 30

## 2011-12-18 MED ORDER — HEPARIN (PORCINE) IN NACL 2-0.9 UNIT/ML-% IJ SOLN
INTRAMUSCULAR | Status: AC
  Filled 2011-12-18: qty 2000

## 2011-12-18 MED ORDER — FENTANYL CITRATE 0.05 MG/ML IJ SOLN
INTRAMUSCULAR | Status: AC
  Filled 2011-12-18: qty 2

## 2011-12-18 MED ORDER — ASPIRIN EC 325 MG PO TBEC
325.0000 mg | DELAYED_RELEASE_TABLET | Freq: Every day | ORAL | Status: DC
  Administered 2011-12-19: 325 mg via ORAL
  Filled 2011-12-18 (×2): qty 1

## 2011-12-18 MED ORDER — ACETAMINOPHEN 325 MG PO TABS: 650.0000 mg | ORAL_TABLET | ORAL | Status: DC | PRN

## 2011-12-18 MED ORDER — SODIUM CHLORIDE 0.9 % IV SOLN
1.0000 mL/kg/h | INTRAVENOUS | Status: AC
  Administered 2011-12-18 (×2): 1 mL/kg/h via INTRAVENOUS

## 2011-12-18 MED ORDER — ONDANSETRON HCL 4 MG/2ML IJ SOLN: 4.0000 mg | Freq: Four times a day (QID) | INTRAMUSCULAR | Status: DC | PRN

## 2011-12-18 MED ORDER — POTASSIUM CHLORIDE 10 MEQ/100ML IV SOLN
10.0000 meq | INTRAVENOUS | Status: AC
  Administered 2011-12-18 (×3): 10 meq via INTRAVENOUS
  Filled 2011-12-18 (×3): qty 100

## 2011-12-18 MED ORDER — MIDAZOLAM HCL 2 MG/2ML IJ SOLN
INTRAMUSCULAR | Status: AC
  Filled 2011-12-18: qty 2

## 2011-12-18 MED ORDER — POTASSIUM CHLORIDE 10 MEQ/100ML IV SOLN
INTRAVENOUS | Status: AC
  Filled 2011-12-18: qty 100

## 2011-12-18 NOTE — Progress Notes (Signed)
Stroke Team Progress Note  HISTORY Courtney Faulkner is an 61 y.o. female with dysarthria, aphasia and right sided weakness. According to son at bedside, his father heard her vaccuming at ~08:30 on 12/14/2011. He came down stairs at 11:30 and found her confused and dysarthric. EMS called and found BS 36. Patient was subsequently brought to the ED. She arrive at 1214. In the ED patient was given some orange juice and her blood CBG went to 166. Patient subsequently had a head CT done which was negative. Initially a code stroke was called however it was canceled secondary to patient's hypoglycemic spells. Per family patient was recently been treated for upper respiratory symptoms with prednisone and albuterol. Patient and family deny any recent fevers, no chills, no chest pain, no diarrhea, no constipation, no dysuria, no generalized weakness. Patient's family does endorse some shortness of breath on exertion when patient climbs up stairs which has been ongoing for the past 2 weeks. On exam and during the interview patient has some dysarthric speech is alert to self only and has an incoherent speech as well. After treatment, CBG 166, pt remains confused, and having difficulty following commands.  Neuro consult called by Dr. Janee Morn for this reason. Patient was not a TPA candidate secondary to presenting beyond the tPA window. She was transferred to the ICU after admission for hypoglycemia and neuro decline for further evaluation and treatment.  SUBJECTIVE Dr. Katrinka Blazing with patient, discussing cath planned for today.  OBJECTIVE Most recent Vital Signs: Filed Vitals:   12/18/11 0400 12/18/11 0500 12/18/11 0600 12/18/11 0700  BP: 108/67 108/64 103/55 98/55  Pulse: 70 128 75 68  Temp: 98.4 F (36.9 C)     TempSrc: Oral     Resp: 26 23 25 24   Height:      Weight:      SpO2: 95% 88% 100% 99%   CBG (last 3)  Basename 12/18/11 0407 12/18/11 0224 12/18/11 0013  GLUCAP 110* 99 98   Intake/Output from  previous day: 04/23 0701 - 04/24 0700 In: 1730.3 [P.O.:240; I.V.:836.3; IV Piggyback:654] Out: 4807 [Urine:4807]  IV Fluid Intake:     . sodium chloride    . dextrose 30 mL/hr at 12/18/11 0700  . DISCONTD: sodium chloride Stopped (12/17/11 0900)   MEDICATIONS    . antiseptic oral rinse  15 mL Mouth Rinse BID  . aspirin  324 mg Oral Pre-Cath  . aspirin  300 mg Rectal Daily   Or  . aspirin  325 mg Oral Daily  . cefTRIAXone (ROCEPHIN)  IV  2 g Intravenous Q24H  . enoxaparin  40 mg Subcutaneous Q24H  . fentaNYL      . fentaNYL  25 mcg Intravenous Once  . furosemide  40 mg Intravenous Q12H  . midazolam      . midazolam  2 mg Intravenous Once  . sodium chloride  3 mL Intravenous Q12H  . vancomycin  1,000 mg Intravenous Q8H  . DISCONTD: furosemide  40 mg Intravenous Q12H   PRN:  sodium chloride, acetaminophen, dextrose, ondansetron (ZOFRAN) IV, sodium chloride  Diet:  NPO for TEE Activity:  Bedrest, Bathroom privileges DVT Prophylaxis:  Lovenox 40 mg sq daily   CLINICALLY SIGNIFICANT STUDIES CBC    Component Value Date/Time   WBC 8.8 12/18/2011 0303   RBC 3.89 12/18/2011 0303   HGB 11.9* 12/18/2011 0303   HCT 36.8 12/18/2011 0303   PLT 242 12/18/2011 0303   MCV 94.6 12/18/2011 0303   MCH 30.6 12/18/2011  0303   MCHC 32.3 12/18/2011 0303   RDW 14.2 12/18/2011 0303   LYMPHSABS 1.9 12/18/2011 0303   MONOABS 0.9 12/18/2011 0303   EOSABS 0.3 12/18/2011 0303   BASOSABS 0.1 12/18/2011 0303   CMP    Component Value Date/Time   NA 137 12/18/2011 0303   K 3.6 12/18/2011 0303   CL 99 12/18/2011 0303   CO2 28 12/18/2011 0303   GLUCOSE 101* 12/18/2011 0303   BUN 9 12/18/2011 0303   CREATININE 0.70 12/18/2011 0303   CALCIUM 8.9 12/18/2011 0303   PROT 6.1 12/18/2011 0303   ALBUMIN 2.5* 12/18/2011 0303   AST 25 12/18/2011 0303   ALT 21 12/18/2011 0303   ALKPHOS 54 12/18/2011 0303   BILITOT 0.4 12/18/2011 0303   GFRNONAA >90 12/18/2011 0303   GFRAA >90 12/18/2011 0303   COAGS Lab Results    Component Value Date   INR 1.04 12/17/2011   Lipid Panel    Component Value Date/Time   CHOL 134 12/16/2011 0311   TRIG 90 12/16/2011 0311   HDL 45 12/16/2011 0311   CHOLHDL 3.0 12/16/2011 0311   VLDL 18 12/16/2011 0311   LDLCALC 71 12/16/2011 0311   HgbA1C  Lab Results  Component Value Date   HGBA1C 5.1 12/16/2011   Cardiac Panel (last 3 results)   Basename 12/16/11 0311 12/15/11 2005 12/15/11 1252  CKTOTAL 349* 417* 407*  CKMB 7.9* 10.6* 11.0*  TROPONINI 0.98* 0.65* 1.41*  RELINDX 2.3 2.5 2.7*   Urinalysis    Component Value Date/Time   COLORURINE YELLOW 12/14/2011 1255   APPEARANCEUR CLEAR 12/14/2011 1255   LABSPEC 1.015 12/14/2011 1255   PHURINE 7.5 12/14/2011 1255   GLUCOSEU 250* 12/14/2011 1255   HGBUR NEGATIVE 12/14/2011 1255   BILIRUBINUR NEGATIVE 12/14/2011 1255   KETONESUR NEGATIVE 12/14/2011 1255   PROTEINUR NEGATIVE 12/14/2011 1255   UROBILINOGEN 0.2 12/14/2011 1255   NITRITE NEGATIVE 12/14/2011 1255   LEUKOCYTESUR NEGATIVE 12/14/2011 1255   Urine Drug Screen  No results found for this basename: labopia,  cocainscrnur,  labbenz,  amphetmu,  thcu,  labbarb    Alcohol Level No results found for this basename: eth   VITAMIN B12     Status: Normal   Collection Time   12/15/11  9:00 AM      Component Value Range   Vitamin B-12 287  211 - 911 (pg/mL)  IRON AND TIBC     Status: Abnormal   Collection Time   12/15/11  9:00 AM      Component Value Range   Iron 39 (*) 42 - 135 (ug/dL)   TIBC 119 (*) 147 - 829 (ug/dL)   Saturation Ratios 17 (*) 20 - 55 (%)   UIBC 192  125 - 400 (ug/dL)  FERRITIN     Status: Normal   Collection Time   12/15/11  9:00 AM      Component Value Range   Ferritin 200  10 - 291 (ng/mL)  RETICULOCYTES     Status: Normal   Collection Time   12/15/11  9:00 AM      Component Value Range   Retic Ct Pct 2.9  0.4 - 3.1 (%)   RBC. 3.97  3.87 - 5.11 (MIL/uL)   Retic Count, Manual 115.1  19.0 - 186.0 (K/uL)  PRO B NATRIURETIC PEPTIDE     Status:  Abnormal   Collection Time   12/15/11  9:00 AM      Component Value Range   Pro B  Natriuretic peptide (BNP) 441.6 (*) 0 - 125 (pg/mL)   CT of the brain   12/15/2011  Developing cytotoxic edema correlates with the observed pattern of restricted diffusion on MR, representing acute infarction. This primarily involves the left basal ganglia.  Smaller areas of cortical and subcortical infarction seen on MRI in the left hemisphere are too small to be easily visible on CT.   No incipient uncal or transtentorial herniation, although minimal 1 mm left to right shift may be present.  No hemorrhagic transformation.    12/14/2011  Normal CT of the head without contrast.   CT Angio Head 12/16/2011  Acute non hemorrhagic infarct left basal ganglia, left uncus and left corona radiata.  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  Collateral flow to the left anterior cerebral artery and left middle cerebral artery branches.  The flow within the left middle cerebral artery branches is diminished suggesting that collateral flow is not adequate.   CT Angio Neck 12/16/2011  Abnormal appearance of the left internal carotid artery with appearance of dissection with complete occlusion at the C2 level.  Abnormal appearance of the proximal left common carotid artery. Question if this is related to artifact versus dissection versus atherosclerotic type changes.  Mild narrowing proximal right common carotid artery.  Prominent ectasia of the proximal right common carotid artery with fold and mild narrowing.  Mild irregularity at the right carotid bifurcation without measurable stenosis.  Mild to moderate narrowing proximal right vertebral artery.  Mild irregularity throughout the course of the right vertebral artery.  Left vertebral artery arises directly from the aortic arch. Mild narrowing proximal left vertebral artery.  Evaluation of portions of the proximal left vertebral artery is limited by artifact.  Nonspecific parenchymal  changes lung apices greater on the right. Findings can be evaluated with a formal chest CT.   MRI of the brain  12/14/2011  Moderate sized acute left hemisphere infarct which appears to be secondary to an acute left internal carotid artery occlusion. No visible hemorrhage or midline shift.  Emboli to the left middle cerebral artery and its branches is not excluded.  MRA of the brain  See CT angio  2D Echocardiogram  EF 60-65% with no source of embolus.  Left atrial mass attached to intra-atrial septum highly suggestive of myxoma.  Carotid Doppler  Right: no ICA stenosis. Left: ICA appears occluded. Bilateral vertebral artery flow is antegrade.  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  CXR  12/18/2011 Some increase in airspace disease likely due to progressive edema. 12/16/2011  Appearance of chest is similar to the recent prior study, again suggesting a background of mild interstitial pulmonary edema. Overall, aeration has slightly improved, with decreasing small bilateral pleural effusions. 12/14/2011   Cardiomegaly and bilateral airspace disease most consistent with pulmonary edema.     CT chest 12/17/2011 Respiratory motion degraded examination with poor inspiration. Scattered pulmonary parenchymal changes some which appears to represent scarring/interstitial fibrosis. Right upper lobe focal bronchiectasis. Scattered regions of mild consolidation may represent scarring although difficult to exclude superimposed infection  CT Abdomen and Pelvis 12/17/2011 On this unenhanced examination, no definitive pancreatic masses identified however, evaluation without contrast is markedly limited. For detection of a neuroendocrine tumor, preferable examination is contrast enhanced MR. If the patient cannot hold her breath, CT with contrast with pancreatic protocol can be obtained. Very small amount of free fluid. Etiology indeterminate.  EKG  normal  sinus rhythm.   Physical Exam   Pleasant middle-age [place] lady currently not in distress.Awake alert. Afebrile. Head is nontraumatic. Neck is supple without bruit. Hearing is normal. Cardiac exam no murmur or gallop. Lungs are clear to auscultation. Distal pulses are well felt.  Neurological Exam ; awake alert mild expressive aphasia with word finding difficulties and paraphasias. Good comprehension. Able to follow commands well. Extraocular movements are full range without nystagmus. No visual field defect to bedside confrontational testing. Mild right lower facial weakness. Tongue is midline. Motor system exam reveals mild right upper extremity drift. Weakness in the right grip and intrinsic hand muscles. Good right lower extremity strength without drift. The minimum right hip flexion and ankle dorsiflexion weakness. Sensation slightly diminished on the right compared to the left. Coordination is slow but I correct. Gait was not tested.  ASSESSMENT Ms. Courtney Faulkner is a 61 y.o. 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 Neuro improvement since admission. On no antiplatelets prior to admission. Now on aspirin 325 mg orally every day for secondary stroke prevention. Patient with resultant right hemiparesis and expressive aphasia with improvement. Ok for IV heparin from neuro standpoint.  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 Will need cath clearance prior to OR. -No sign of endocarditis on TEE, on Vanc and rocepehin started 12/16/2011 -left ICA occlusion suspect secondary to myxoma emboli vs dissection -tachycardia -hypoglycemia, recurrent, ? Etiology, Insulinoma not confirmed on CT. Workup underway -Nonspecific parenchymal changes lung apices greater on the right. With progressive pulmonary edema today. -elevated cardiac enzymes, cardiac related.  -short PR -obesity,  class 2, Body mass index is 35.69 kg/(m^2).   Hospital day # 4  TREATMENT/PLAN -cardiac cath for surgery clearance -open heart surgery to remove myxoma. Neuro prefers OR prior to discharge home due to risk for additional neuro events. -ok for IV heparin from neuro standpoint.  Joaquin Music, ANP-BC, GNP-BC [facility] Pager: [phone] 12/18/2011 7:48 AM  Dr. Delia Heady, Stroke Center Medical Director, has personally reviewed chart, pertinent data, examined the patient and developed the plan of care.

## 2011-12-18 NOTE — Progress Notes (Signed)
Discussed the plans for surgery with Dr. Tyrone Sage who also requests that we perform abdominal aortography to evaluate for possible right femoral access to bypass. The patient and I again discussed cath and risk of stroke, death, MI. Allergy, kidney injury, bleeding, limb ischemia as possible complications. She is alert and oriented. A;ll questions were answered. She is willing to proceed.

## 2011-12-18 NOTE — Progress Notes (Signed)
Speech Language Pathology Treatment  Patient Details Name: PEGAH SEGEL MRN: [id number] DOB: 24-Oct-1950 Today's Date: 12/18/2011  SLP Assessment/Plan/Recommendation Clinical Impression Statement: Treatment focused on functional communication via multi-modalities with only minimal assistance to verbalize her basic needs.  Verbal expression marked by neologisms, semantic paraphasias and emerging emergent awareness of errors, however unable to self-correct.  Marked language and cognitive improvement with increased sustained attention (15 minutes), improved basic functional problem solving as well as better able to follow 1 step directions (no cues). Plan: Continue with current plan of care  SLP Goals  SLP Goals SLP Goal #1 - Progress: Progressing toward goal SLP Goal #2 - Progress: Progressing toward goal SLP Goal #3 - Progress: Progressing toward goal  General Temperature Spikes Noted: No Respiratory Status: Room air Behavior/Cognition: Alert;Cooperative Patient Positioning: Upright in bed  Treatment Treatment focused on: Aphasia   Myra Rude, M.S.,CCC-SLP Pager [phone] 12/18/2011, 12:23 PM

## 2011-12-18 NOTE — Consult Note (Signed)
Name: Courtney Faulkner MRN: [id number] DOB: [birth year]    LOS: 4 Requesting MD:  Sharon Seller Regarding: Stoke/hypoglycemia   CONSULT NOTE  History of Present Illness: 61 yo AAF , sociology teacher at [employer], non smoker/drinker/no PMH , who presented 4/20 with garbled speech, hypoglycemia.  MRI revealed moderate lt hemispheric infarct. 4/21 she again had hypogylcemia and change in speech pattern. She was moved to NSICU and PCCM asked to assume her care. She is being followed by stroke team. 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 Abnormal appearance of the proximal left common carotid artery. Question if this is related to artifact versus dissection versus atherosclerotic type changes. Other mild narrowing throughout Mild to moderate narrowing proximal right vertebral artery.. Nonspecific parenchymal changes lung apices greater on the right. Findings can be evaluated with a formal chest CT.  Acute non hemorrhagic infarct left basal ganglia, left uncus and left corona radiata.Left internal carotid artery is occluded. Collateral flow to the left anterior cerebral artery and left middle cerebral artery branches. The flow within the left middle cerebral artery branches<BR>is diminished suggesting that collateral flow is not adequate  4/21 ct head>>>Developing cytotoxic edema correlates with the observed pattern of restricted diffusion on MR, representing acute infarction. This primarily  involves the left basal ganglia. Smaller areas of cortical and subcortical infarction seen on MRI in the left hemisphere are too small to be easily visible on CT. No incipient uncal or transtentorial herniation, although minimal 1 mm left to right shift may be present. No hemorrhagic transformation.  4/21 tx to nsicu 4/22- unofficial left atrial mass / clot? 4/22- echo- The estimated ejection fraction was in the range of 60% to 65%. Wall motion was normal; there were no regional wall motion abnormalities. Doppler parameters are consistent with abnormal left ventricular relaxation (grade 1 diastolic dysfunction). - Left atrium: The atrium was mildly dilated. There was a mass. There was a medium-sized, 22mm (L) x 18mm (W)masson the left side of the interatrial septum; the appearance is consistent with myxoma. 4/22- CVTS called 4/23- improved neurostatus 4/23 TEE - 1.7 by 1.7 cm mobile density in the left atrium attached to the septum; probable myxoma.   4/24 cardiac cath>>>  Subjective: For cath, glu 71 on 30 cc/hr d10  Vital Signs: Temp:  [98.4 F (36.9 C)-99.6 F (37.6 C)] 98.8 F (37.1 C) (04/24 0700) Pulse Rate:  [60-128] 66  (04/24 1000) Resp:  [21-30] 24  (04/24 1000) BP: (94-113)/(48-67) 104/51 mmHg (04/24 1000) SpO2:  [88 %-100 %]  99 % (04/24 1000) Weight:  [91.4 kg (201 lb 8 oz)] 91.4 kg (201 lb 8 oz) (04/24 0200) I/O last 3 completed shifts: In: 3680.3 [P.O.:240; I.V.:2336.3; IV Piggyback:1104] Out: 6882 [Urine:6882]  Physical Examination: General:  Improved affect slow Neuro: follows commands , improved strength, dysarthria unchanged HEENT:  No jvd Cardiovascular:  s1 s2 rrr no r Lungs:  Cta, reduced bases Abdomen:  +bs obese, NT ND Musculoskeletal:  intact Skin:  warm  Labs and Imaging:  Ct Abdomen Pelvis Wo Contrast  12/17/2011  *RADIOLOGY REPORT*  Clinical Data:  Upper respiratory tract infection.  Recurrent hypoglycemia.  Rule out neuroendocrine  tumor.  CT CHEST, ABDOMEN AND PELVIS WITHOUT CONTRAST  Technique:  Multidetector CT imaging of the chest, abdomen and pelvis was performed following the standard protocol without IV contrast.  Comparison:  CT angiogram head and neck 12/15/2011. 12/17/2011 chest x-ray.  CT CHEST  Findings:  Respiratory motion degraded examination with poor inspiration.  Scattered pulmonary parenchymal changes some which appears to represent scarring/interstitial fibrosis. Right upper lobe focal bronchiectasis.  Scattered regions of mild consolidation may represent scarring although difficult to exclude superimposed infection.  The patient would benefit from follow-up examination after acute episode has cleared (when the patient is able to perform suspended respiration) for further delineation of lung parenchyma. This would help establish the patient's baseline and exclude underlying mass.  The trachea and mainstem bronchi are patent.  Cardiomegaly.  Ectatic partially calcified thoracic aorta.  Ascending thoracic aorta measures up to 3.8 cm.  No mediastinal, hilar or axillary adenopathy.  No bony destructive lesion.  IMPRESSION: Respiratory motion degraded examination with poor inspiration.  Scattered pulmonary parenchymal changes some which appears to represent scarring/interstitial fibrosis. Right upper lobe focal bronchiectasis.  Scattered regions of mild consolidation may represent scarring although difficult to exclude superimposed infection. Follow up as noted above.  Cardiomegaly.  Ectatic partially calcified thoracic aorta.  Ascending thoracic aorta measures up to 3.8 cm.  CT ABDOMEN AND PELVIS  Findings:  Right flank lipoma measures 11.2 x 5.4 x 8.6 cm.  On this unenhanced examination, no definitive pancreatic masses identified however, evaluation without contrast is markedly limited.  For detection of a neuroendocrine tumor, preferable examination is contrast enhanced MR.   If the patient cannot hold her breath, CT with  contrast with pancreatic protocol can be obtained.  On this unenhanced examination, dome of liver 1.2 cm lesion suggestive of a cyst.  Dense bile within the gallbladder without calcified gallstone.  Evaluation of solid abdominal viscera is limited by lack of IV contrast.  Taking this limitation into account no focal splenic renal or adrenal lesion is noted.  Atherosclerotic type changes of the aorta with ectasia. Atherosclerotic type changes of the common iliac arteries.  Foley catheter placed with air in the bladder.  No obvious bowel inflammatory process.  Portions of the colon decompressed and evaluation limited. Very small amount of free fluid.  Etiology indeterminate.  No free air.  No bony destructive lesion.  IMPRESSION: On this unenhanced examination, no definitive pancreatic masses identified however, evaluation without contrast is markedly limited.  For detection of a neuroendocrine tumor, preferable examination is contrast enhanced MR.   If the patient cannot hold her breath, CT with contrast with pancreatic protocol can be obtained.  Very small amount of free fluid.  Etiology indeterminate.  Original Report Authenticated By: Fuller Canada, M.D.   Ct Chest Wo Contrast  12/17/2011  *RADIOLOGY REPORT*  Clinical Data:  Upper respiratory tract infection.  Recurrent hypoglycemia.  Rule out neuroendocrine tumor.  CT CHEST, ABDOMEN AND PELVIS WITHOUT CONTRAST  Technique:  Multidetector CT imaging of the chest, abdomen and pelvis was performed following the standard protocol without IV contrast.  Comparison:  CT angiogram head and neck 12/15/2011. 12/17/2011 chest x-ray.  CT CHEST  Findings:  Respiratory motion degraded examination with poor inspiration.  Scattered pulmonary parenchymal changes some which appears to represent scarring/interstitial fibrosis. Right upper lobe focal bronchiectasis.  Scattered regions of mild consolidation may represent scarring although difficult to exclude superimposed  infection.  The patient would benefit from follow-up examination after acute episode has cleared (when the patient is able to perform suspended respiration) for further delineation of lung parenchyma. This would help establish the patient's baseline and exclude underlying mass.  The trachea and mainstem bronchi are patent.  Cardiomegaly.  Ectatic partially calcified thoracic aorta.  Ascending thoracic aorta measures up to 3.8 cm.  No mediastinal, hilar or axillary adenopathy.  No bony destructive lesion.  IMPRESSION: Respiratory motion degraded examination with poor inspiration.  Scattered pulmonary parenchymal changes some which appears to represent scarring/interstitial fibrosis. Right upper lobe focal bronchiectasis.  Scattered regions of mild consolidation may represent scarring although difficult to exclude superimposed infection. Follow up as noted above.  Cardiomegaly.  Ectatic partially calcified thoracic aorta.  Ascending thoracic aorta measures up to 3.8 cm.  CT ABDOMEN AND PELVIS  Findings:  Right flank lipoma measures 11.2 x 5.4 x 8.6 cm.  On this unenhanced examination, no definitive pancreatic masses identified however, evaluation without contrast is markedly limited.  For detection of a neuroendocrine tumor, preferable examination is contrast enhanced MR.   If the patient cannot hold her breath, CT with contrast with pancreatic protocol can be obtained.  On this unenhanced examination, dome of liver 1.2 cm lesion suggestive of a cyst.  Dense bile within the gallbladder without calcified gallstone.  Evaluation of solid abdominal viscera is limited by lack of IV contrast.  Taking this limitation into account no focal splenic renal or adrenal lesion is noted.  Atherosclerotic type changes of the aorta with ectasia. Atherosclerotic type changes of the common iliac arteries.  Foley catheter placed with air in the bladder.  No obvious bowel inflammatory process.  Portions of the colon decompressed and  evaluation limited. Very small amount of free fluid.  Etiology indeterminate.  No free air.  No bony destructive lesion.  IMPRESSION: On this unenhanced examination, no definitive pancreatic masses identified however, evaluation without contrast is markedly limited.  For detection of a neuroendocrine tumor, preferable examination is contrast enhanced MR.   If the patient cannot hold her breath, CT with contrast with pancreatic protocol can be obtained.  Very small amount of free fluid.  Etiology indeterminate.  Original Report Authenticated By: Fuller Canada, M.D.   Dg Chest Port 1 View  12/18/2011  *RADIOLOGY REPORT*  Clinical Data: Right side weakness.  Stroke.  PORTABLE CHEST - 1 VIEW  Comparison: Plain film chest 12/14/2011 and 12/17/2011.  Findings: Patchy bilateral airspace disease shows some interval increase on the left.  Heart size is upper normal.  IMPRESSION: Some increase in airspace disease likely due to progressive edema.  Original Report Authenticated By: Bernadene Bell. Maricela Curet, M.D.   Dg Chest Port 1 View  12/17/2011  *RADIOLOGY REPORT*  Clinical Data: Assess pulmonary edema.  PORTABLE CHEST - 1 VIEW  Comparison: Chest x-ray 12/14/2011.  Findings: Lung volumes remain low.  There is blunting of both costophrenic sulci, compatible with small bilateral  pleural effusions.  There continues to be some cephalization of the pulmonary vasculature and indistinctness of interstitial markings, which may suggest some mild pulmonary edema.  Additional bibasilar opacities medially are favored to represent areas of subsegmental atelectasis.  Heart size is mildly enlarged (unchanged). The patient is rotated to the right on today's exam, resulting in distortion of the mediastinal contours and reduced diagnostic sensitivity and specificity for mediastinal pathology.  IMPRESSION: 1.  Appearance of chest is similar to the recent prior study, again suggesting a background of mild interstitial pulmonary edema.  Overall, aeration has slightly improved, with decreasing small bilateral pleural effusions.  Original Report Authenticated By: Florencia Reasons, M.D.    Lab 12/18/11 0700 12/18/11 0303 12/17/11 2248  NA 135 137 136  K 3.2* 3.6 3.3*  CL 97 99 99  CO2 28 28 25   BUN 9 9 10   CREATININE 0.69 0.70 0.78  GLUCOSE 98 101* 108*    Lab 12/18/11 0303 12/17/11 0300 12/16/11 0311  HGB 11.9* 11.4* 11.2*  HCT 36.8 35.3* 34.7*  WBC 8.8 9.2 8.9  PLT 242 240 261   ABG No results found for this basename: phart,  pco2,  pco2art,  po2,  po2art,  hco3,  tco2,  acidbasedef,  o2sat    Assessment and Plan: Recurrent hypoglycemia R/o sepsis/ veg on myxoma induced R/o neuro endocrine tumor -continue d10 at 30, glu 75, unable to reduce further especially as NPO cath -c-peptic elevated, would be c/w insulinoma, neuroendocrine tumor, still await insulin and pro insulin levels, esnure ordered  -CT no pancreatic mass -CT chest  Now that she needs cath and again contrast , and clinical suspscion lower for primary pancreatic insulinoma, will change CT to NON contrast study  surveys -ca 19-9 - 2.6, low ABX, vanc, ceftriaxone empiric, BC to follow repeats after likely contam on 4/20  New lt stroke -per stroke team, improved -heparin in future, d/w stroke  Clot / mass lEft atrium - likely myxoma Reviewed TEE Appreciate CVTS guidance Cath at noon  Hypokalemia Replace IV Recheck in am with mag,phos   R/o pulm edema component, increase rr Lasix to repeat same neggoal pcxr improved IS  Best practices / Disposition: -->ICU status under PCCM -->full code -->lovenox for DVT Px -->Protonix not indicated -->diet Update son and husband daily   Mcarthur Rossetti. Tyson Alias, MD, FACP Pgr: [phone] [facility]

## 2011-12-18 NOTE — CV Procedure (Addendum)
     Diagnostic Cardiac Catheterization Report  Courtney Faulkner  61 y.o.  female 1951-01-05  Procedure Date: 12/18/2011 Referring Physician: CCM Primary Cardiologist:: Wayne Both, III. MD   PROCEDURE:  Left heart catheterization with selective coronary angiographyand abdominal aortography.  INDICATIONS:  Left atrial myxoma with planned surgical resection. The study is being done to assess coronary anatomy and to perform abdominal aortography with runoff to identify suitable anatomy from right femoral which may be a site for bypass cannula.  The risks, benefits, and details of the procedure were explained to the patient.  The patient verbalized understanding and wanted to proceed.  Informed written consent was obtained.  PROCEDURE TECHNIQUE:  After Xylocaine anesthesia a 46F sheath was placed in the left femoral artery with a single anterior needle wall stick.   Coronary angiography was done using a 5 [place] #4 Judkins left and right catheters.  Left ventriculography was done using a 5 French angled pigtail catheter.    CONTRAST:  Total of 110 cc.  COMPLICATIONS:  None.    HEMODYNAMICS:  Aortic pressure was 110/64 mmHg; LV pressure was not recorded.  ANGIOGRAPHIC DATA:   The left main coronary artery is widely patent..  The left anterior descending artery is normally distributed, surgeon Diagonal, and is normal..  The left circumflex artery is gives origin to a branching first obtuse marginal, and is normal. The left atrial recurrent branch is small..  The right coronary artery is dominant giving the PDA and two left ventricular branches. The left atrial recurrent branch arises from the second left ventricular branch and supplies the  atrial septum and quite nicely visualizes neovascularization within the myxoma and contrast spewing from the tumor tip and into the left atrium.  LEFT VENTRICULOGRAM:  Left ventricular angiogram was not done.  ABDOMINAL AORTOGRAPHY: No aneurysm  obstructive disease is noted in the abdominal aorta left renal artery or either iliac artery.  IMPRESSIONS:   1. Normal coronary arteries.  2. Normal abdominal aorta, iliac and femoral arteries.  3. Left atrial myxoma identified by neovascularization during right coronary angiography.  4. Left ventriculography was not performed.   RECOMMENDATION:  Myxoma resection by Dr. Tyrone Sage.

## 2011-12-18 NOTE — Progress Notes (Signed)
Patient's CBG taken at 1700 is 56. Patient given 4oz orange juice and repeat CBG was 72. Patient's 1800 CBG 42 with 1st sample and 72 with second sample. Cath lab reported to RN CBG 12 with 1st sample and 72 with 2nd sample at 1400. CCM made aware and advised lab draw sample. Lab draw sample results in CBG of 116. CCM notified of results; no new orders at this time.

## 2011-12-18 NOTE — Progress Notes (Signed)
Patient taken to cath lab via transporter and cath lab RN.

## 2011-12-18 NOTE — Progress Notes (Signed)
Patient ID: Courtney Faulkner, female   DOB: Mar 31, 1951, 61 y.o.   MRN: [id number] T[facility] DAILY PROGRESS NOTE                   [address]            [place] [address]          [phone]      Day of Surgery Procedure(s) (LRB): CORONARY ANGIOGRAM () ABDOMINAL ANGIOGRAM ()  Total Length of Stay:  LOS: 4 days   Subjective: Just back from cath, awake but mildly confused   Objective: Vital signs in last 24 hours: Temp:  [98.4 F (36.9 C)-99.6 F (37.6 C)] 98.6 F (37 C) (04/24 1547) Pulse Rate:  [52-128] 52  (04/24 1830) Cardiac Rhythm:  [-] Normal sinus rhythm (04/24 1600) Resp:  [14-33] 25  (04/24 1830) BP: (94-115)/(42-67) 109/50 mmHg (04/24 1830) SpO2:  [88 %-100 %] 100 % (04/24 1830) Weight:  [201 lb 8 oz (91.4 kg)] 201 lb 8 oz (91.4 kg) (04/24 0200)  Filed Weights   12/16/11 0400 12/17/11 0600 12/18/11 0200  Weight: 208 lb 8.9 oz (94.6 kg) 203 lb 4.2 oz (92.2 kg) 201 lb 8 oz (91.4 kg)    Weight change: -1 lb 12.2 oz (-0.8 kg)   Hemodynamic parameters for last 24 hours:    Intake/Output from previous day: 04/23 0701 - 04/24 0700 In: 1730.3 [P.O.:240; I.V.:836.3; IV Piggyback:654] Out: 4882 [Urine:4882]  Intake/Output this shift: Total I/O In: 1287 [I.V.:787; IV Piggyback:500] Out: 1275 [Urine:1275]  Current Meds: Scheduled Meds:   . antiseptic oral rinse  15 mL Mouth Rinse BID  . aspirin  324 mg Oral Pre-Cath  . aspirin EC  325 mg Oral Daily  . aspirin  300 mg Rectal Daily   Or  . aspirin  325 mg Oral Daily  . cefTRIAXone (ROCEPHIN)  IV  2 g Intravenous Q24H  . enoxaparin  40 mg Subcutaneous Q24H  . fentaNYL      . furosemide  40 mg Intravenous Q12H  . heparin      . lidocaine      . midazolam      . nitroGLYCERIN      . potassium chloride  10 mEq Intravenous Q1 Hr x 4  . vancomycin  1,000 mg Intravenous Q8H  . DISCONTD: sodium chloride  3 mL Intravenous Q12H   Continuous Infusions:   . sodium chloride 1 mL/kg/hr (12/18/11  1800)  . dextrose 30 mL/hr at 12/18/11 1800  . DISCONTD: sodium chloride     PRN Meds:.acetaminophen, acetaminophen, dextrose, ondansetron (ZOFRAN) IV, ondansetron (ZOFRAN) IV, DISCONTD: sodium chloride, DISCONTD: sodium chloride  General appearance: alert, cooperative and trouble with speach Neurologic: right sided weakness Heart: regular rate and rhythm, S1, S2 normal, no murmur, click, rub or gallop and normal apical impulse Lungs: clear to auscultation bilaterally and normal percussion bilaterally Abdomen: soft, non-tender; bowel sounds normal; no masses,  no organomegaly Extremities: rt arm and leg weak  Lab Results: CBC: Basename 12/18/11 0303 12/17/11 0300  WBC 8.8 9.2  HGB 11.9* 11.4*  HCT 36.8 35.3*  PLT 242 240   BMET:  Basename 12/18/11 0700 12/18/11 0303  NA 135 137  K 3.2* 3.6  CL 97 99  CO2 28 28  GLUCOSE 98 101*  BUN 9 9  CREATININE 0.69 0.70  CALCIUM 9.1 8.9    PT/INR:  Basename 12/17/11 1906  LABPROT 13.8  INR 1.04   Radiology: Ct Abdomen  Pelvis Wo Contrast  12/17/2011  *RADIOLOGY REPORT*  Clinical Data:  Upper respiratory tract infection.  Recurrent hypoglycemia.  Rule out neuroendocrine tumor.  CT CHEST, ABDOMEN AND PELVIS WITHOUT CONTRAST  Technique:  Multidetector CT imaging of the chest, abdomen and pelvis was performed following the standard protocol without IV contrast.  Comparison:  CT angiogram head and neck 12/15/2011. 12/17/2011 chest x-ray.  CT CHEST  Findings:  Respiratory motion degraded examination with poor inspiration.  Scattered pulmonary parenchymal changes some which appears to represent scarring/interstitial fibrosis. Right upper lobe focal bronchiectasis.  Scattered regions of mild consolidation may represent scarring although difficult to exclude superimposed infection.  The patient would benefit from follow-up examination after acute episode has cleared (when the patient is able to perform suspended respiration) for further delineation of  lung parenchyma. This would help establish the patient's baseline and exclude underlying mass.  The trachea and mainstem bronchi are patent.  Cardiomegaly.  Ectatic partially calcified thoracic aorta.  Ascending thoracic aorta measures up to 3.8 cm.  No mediastinal, hilar or axillary adenopathy.  No bony destructive lesion.  IMPRESSION: Respiratory motion degraded examination with poor inspiration.  Scattered pulmonary parenchymal changes some which appears to represent scarring/interstitial fibrosis. Right upper lobe focal bronchiectasis.  Scattered regions of mild consolidation may represent scarring although difficult to exclude superimposed infection. Follow up as noted above.  Cardiomegaly.  Ectatic partially calcified thoracic aorta.  Ascending thoracic aorta measures up to 3.8 cm.  CT ABDOMEN AND PELVIS  Findings:  Right flank lipoma measures 11.2 x 5.4 x 8.6 cm.  On this unenhanced examination, no definitive pancreatic masses identified however, evaluation without contrast is markedly limited.  For detection of a neuroendocrine tumor, preferable examination is contrast enhanced MR.   If the patient cannot hold her breath, CT with contrast with pancreatic protocol can be obtained.  On this unenhanced examination, dome of liver 1.2 cm lesion suggestive of a cyst.  Dense bile within the gallbladder without calcified gallstone.  Evaluation of solid abdominal viscera is limited by lack of IV contrast.  Taking this limitation into account no focal splenic renal or adrenal lesion is noted.  Atherosclerotic type changes of the aorta with ectasia. Atherosclerotic type changes of the common iliac arteries.  Foley catheter placed with air in the bladder.  No obvious bowel inflammatory process.  Portions of the colon decompressed and evaluation limited. Very small amount of free fluid.  Etiology indeterminate.  No free air.  No bony destructive lesion.  IMPRESSION: On this unenhanced examination, no definitive  pancreatic masses identified however, evaluation without contrast is markedly limited.  For detection of a neuroendocrine tumor, preferable examination is contrast enhanced MR.   If the patient cannot hold her breath, CT with contrast with pancreatic protocol can be obtained.  Very small amount of free fluid.  Etiology indeterminate.  Original Report Authenticated By: Fuller Canada, M.D.   Ct Chest Wo Contrast  12/17/2011  *RADIOLOGY REPORT*  Clinical Data:  Upper respiratory tract infection.  Recurrent hypoglycemia.  Rule out neuroendocrine tumor.  CT CHEST, ABDOMEN AND PELVIS WITHOUT CONTRAST  Technique:  Multidetector CT imaging of the chest, abdomen and pelvis was performed following the standard protocol without IV contrast.  Comparison:  CT angiogram head and neck 12/15/2011. 12/17/2011 chest x-ray.  CT CHEST  Findings:  Respiratory motion degraded examination with poor inspiration.  Scattered pulmonary parenchymal changes some which appears to represent scarring/interstitial fibrosis. Right upper lobe focal bronchiectasis.  Scattered regions of mild consolidation  may represent scarring although difficult to exclude superimposed infection.  The patient would benefit from follow-up examination after acute episode has cleared (when the patient is able to perform suspended respiration) for further delineation of lung parenchyma. This would help establish the patient's baseline and exclude underlying mass.  The trachea and mainstem bronchi are patent.  Cardiomegaly.  Ectatic partially calcified thoracic aorta.  Ascending thoracic aorta measures up to 3.8 cm.  No mediastinal, hilar or axillary adenopathy.  No bony destructive lesion.  IMPRESSION: Respiratory motion degraded examination with poor inspiration.  Scattered pulmonary parenchymal changes some which appears to represent scarring/interstitial fibrosis. Right upper lobe focal bronchiectasis.  Scattered regions of mild consolidation may represent  scarring although difficult to exclude superimposed infection. Follow up as noted above.  Cardiomegaly.  Ectatic partially calcified thoracic aorta.  Ascending thoracic aorta measures up to 3.8 cm.  CT ABDOMEN AND PELVIS  Findings:  Right flank lipoma measures 11.2 x 5.4 x 8.6 cm.  On this unenhanced examination, no definitive pancreatic masses identified however, evaluation without contrast is markedly limited.  For detection of a neuroendocrine tumor, preferable examination is contrast enhanced MR.   If the patient cannot hold her breath, CT with contrast with pancreatic protocol can be obtained.  On this unenhanced examination, dome of liver 1.2 cm lesion suggestive of a cyst.  Dense bile within the gallbladder without calcified gallstone.  Evaluation of solid abdominal viscera is limited by lack of IV contrast.  Taking this limitation into account no focal splenic renal or adrenal lesion is noted.  Atherosclerotic type changes of the aorta with ectasia. Atherosclerotic type changes of the common iliac arteries.  Foley catheter placed with air in the bladder.  No obvious bowel inflammatory process.  Portions of the colon decompressed and evaluation limited. Very small amount of free fluid.  Etiology indeterminate.  No free air.  No bony destructive lesion.  IMPRESSION: On this unenhanced examination, no definitive pancreatic masses identified however, evaluation without contrast is markedly limited.  For detection of a neuroendocrine tumor, preferable examination is contrast enhanced MR.   If the patient cannot hold her breath, CT with contrast with pancreatic protocol can be obtained.  Very small amount of free fluid.  Etiology indeterminate.  Original Report Authenticated By: Fuller Canada, M.D.   Dg Chest Port 1 View  12/18/2011  *RADIOLOGY REPORT*  Clinical Data: Right side weakness.  Stroke.  PORTABLE CHEST - 1 VIEW  Comparison: Plain film chest 12/14/2011 and 12/17/2011.  Findings: Patchy bilateral  airspace disease shows some interval increase on the left.  Heart size is upper normal.  IMPRESSION: Some increase in airspace disease likely due to progressive edema.  Original Report Authenticated By: Bernadene Bell. Maricela Curet, M.D.   Dg Chest Port 1 View  12/17/2011  *RADIOLOGY REPORT*  Clinical Data: Assess pulmonary edema.  PORTABLE CHEST - 1 VIEW  Comparison: Chest x-ray 12/14/2011.  Findings: Lung volumes remain low.  There is blunting of both costophrenic sulci, compatible with small bilateral pleural effusions.  There continues to be some cephalization of the pulmonary vasculature and indistinctness of interstitial markings, which may suggest some mild pulmonary edema.  Additional bibasilar opacities medially are favored to represent areas of subsegmental atelectasis.  Heart size is mildly enlarged (unchanged). The patient is rotated to the right on today's exam, resulting in distortion of the mediastinal contours and reduced diagnostic sensitivity and specificity for mediastinal pathology.  IMPRESSION: 1.  Appearance of chest is similar to the recent prior  study, again suggesting a background of mild interstitial pulmonary edema. Overall, aeration has slightly improved, with decreasing small bilateral pleural effusions.  Original Report Authenticated By: Florencia Reasons, M.D.     Assessment/Plan: S/P Procedure(s) (LRB): CORONARY ANGIOGRAM () ABDOMINAL ANGIOGRAM ()  Cath shows not coronary disease, removal of myxoma could be done through the right chest rather than sternotomy. Patient will be when her neurologic status is stable enough and that she can be fully heparinized with cardiac surgery doses of heparin. Tentatively next week possibly Tuesday would be appropriate to proceed with surgical removal of the myxoma. Dr. Cornelius Moras will see the patient on Friday as far as appropriateness for right thoracotomy approach.. I will discuss the heparinization issues with Dr. Pearlean Brownie.Delight Ovens  MD  Beeper [phone] Office [phone] 12/18/2011 6:54 PM

## 2011-12-18 NOTE — Progress Notes (Signed)
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 MRI revealed moderate lt hemispheric infarct. 4/21 she again had hypogylcemia and change in speech pattern. She was moved to NSICU and PCCM asked to assume her care. She is being followed by stroke team. 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  -TEE: 1.7 x 1.7 cm mass attached to the left atrial septum most likely secondary to left atrial myxoma. normal LV function and normal wall motion. -Lab: negative ANA, RF, C3, CA19-9 and sickel cell screen; elevated Crp 4.85  and ESR46.  -CT-abd/Pelvis w/o CM showed  no definitive pancreatic masses identified  -CT-chest:  scarring/interstitial fibrosis in lung. Right upper lobe focal, bronchiectasis. Scattered regions of mild consolidation may represent scarring although difficult to exclude superimposed infection. Cardiomegaly. Ectatic Ascending thoracic aorta measures up to 3.8 cm.     Past Medical History  Diagnosis Date  . Upper respiratory tract infection    History reviewed. No pertinent past surgical history. Prior to Admission medications   Medication Sig Start Date End Date Taking? Authorizing Provider  albuterol (PROVENTIL HFA;VENTOLIN HFA) 108 (90 BASE) MCG/ACT inhaler Inhale 2 puffs into the lungs every 6 (six) hours as needed. For wheezing   Yes Historical Provider, MD  predniSONE (DELTASONE) 10 MG tablet Take 10 mg by mouth daily. 12 day pack filled on 12-06-11   Yes Historical Provider, MD   Allergies No Known Allergies  Family History Family History    Problem Relation Age of Onset  . Coronary artery disease Neg Hx     Social History  reports that she quit smoking about 9 years ago. She has never used smokeless tobacco. She reports that she does not drink alcohol or use illicit drugs.  Review Of Systems   ROS: General: no fevers, chills Skin: no rash HEENT: no blurry vision, hearing changes or sore throat Pulm: No dyspnea, no coughing or wheezing CV: no chest pain, palpitations. No shortness of breath Abd: no nausea/vomiting, abdominal pain, diarrhea/constipation GU: no dysuria, hematuria, polyuria Ext: no arthralgias, myalgias Neuro: weakness, No numbness, or tingling  Vital Signs: Temp:  [98.4 F (36.9 C)-99.6 F (37.6 C)] 98.4 F (36.9 C) (04/24 0400) Pulse Rate:  [60-128] 128  (04/24 0500) Resp:  [21-33] 23  (04/24 0500) BP: (101-119)/(48-70) 108/64 mmHg (04/24 0500) SpO2:  [88 %-100 %] 88 % (04/24 0500) Weight:  [201 lb 8 oz (91.4 kg)-203 lb 4.2 oz (92.2 kg)] 201 lb 8 oz (91.4 kg) (04/24 0200) I/O last 3 completed shifts: In: 4416.3 [P.O.:240; I.V.:3476.3; IV Piggyback:700] Out: 5667 [Urine:5667]  Physical Examination:  General: WNWD NAD, flat affect  Neuro: alert and oriented X 3, follows commands , improved strength. Still has slurry speech. HEENT: No jvd  Cardiovascular: s1 s2 rrr no r  Lungs: cta  Abdomen: +bs obese, NT ND  Musculoskeletal: intact  Skin: warm  Ventilator settings: not on ventilator   Labs and Imaging:  Reviewed.  Please refer to the Assessment and Plan section for relevant results.  ASSESSMENT AND PLAN  NEUROLOGIC A:  New lt  stroke, muscle strength improved. TEE did not show vegetation. Positive blood culture P: -per stroke team, improved.  -continue ASA  Left atrial Myxoma:  A: TEE showed 1.7 x 1.7 cm mass attached to the left atrial septum most likely secondary to left atrial myxoma. P:  - Appreciate CVTS guidance  -possible surgery  PULMONARY A:  [place] currently.  Lung is clear to ausculation. O2 Sat is good. CT- chest showed scarring/interstitial fibrosis in lung. Right upper lobe focal, bronchiectasis. Scattered regions of mild consolidation may represent scarring although difficult to exclude superimposed infection.  P: - observe. Ct reviewed in full  CARDIOVASCULAR  Lab 12/16/11 0311 12/15/11 2005 12/15/11 1252 12/15/11 0900 12/14/11 2227 12/14/11 1456  TROPONINI 0.98* 0.65* 1.41* 1.48* 1.34* --  LATICACIDVEN -- -- -- -- -- --  PROBNP -- -- -- 441.6* -- 188.5*   A:  No chest pain, but with elevated troponin.  P: -appreciate cardiolog's help in managing our patient -posible cath today  RENAL  Lab 12/18/11 0303 12/17/11 2248 12/17/11 1906 12/17/11 1314 12/17/11 0750 12/14/11 1456  NA 137 136 135 137 136 --  K 3.6 3.3* -- -- -- --  CL 99 99 100 102 103 --  CO2 28 25 26 26 25  --  BUN 9 10 6 6 6  --  CREATININE 0.70 0.78 0.69 0.63 0.61 --  CALCIUM 8.9 9.0 9.0 9.1 8.8 --  MG -- -- -- -- -- 1.9  PHOS -- -- -- -- -- --   A:  No issue.  P: -BMP in AM Avoid daily pos balance as D10 needed  GASTROINTESTINAL  Lab 12/18/11 0303 12/17/11 0300 12/15/11 0900 12/14/11 1236  AST 25 24 33 30  ALT 21 19 25 27   ALKPHOS 54 50 54 52  BILITOT 0.4 0.3 0.4 0.3  PROT 6.1 5.6* 6.3 5.9*  ALBUMIN 2.5* 2.3* 2.7* 2.7*   A:  No significant issue. Slightly decreased albumin. P: -->  Follow up with CMP  HEMATOLOGIC  Lab 12/18/11 0303 12/17/11 1906 12/17/11 0300 12/16/11 0311 12/15/11 0900 12/14/11 1742  HGB 11.9* -- 11.4* 11.2* 12.2 11.3*  HCT 36.8 -- 35.3* 34.7* 38.0 35.7*  PLT 242 -- 240 261 306 268  INR -- 1.04 -- -- -- --  APTT -- -- -- -- -- --   A:  Anemia unclear etiology. Normal Vb12 and ferritin. Likely due to chronic disease. Stable Hgb.  P: -->  CBC in AM  INFECTIOUS  Lab 12/18/11 0303 12/17/11 0300 12/16/11 0311 12/15/11 0900 12/14/11 1742  WBC 8.8 9.2 8.9 7.6 10.5  PROCALCITON -- -- -- -- --   A:  positive blood culture for  gram positive cocci in cluster, likely staph. Unclear source, possibly from infected myxoma. TEE no vegetation. P: -->  Continue vancomycin and ceftriaxone - at risk veg on myxoma Pct assessment  ENDOCRINE  Lab 12/18/11 0407 12/18/11 0224 12/18/11 0013 12/17/11 2214 12/17/11 1956  GLUCAP 110* 99 98 107* 111*   A:  Hypoglycemia. Unclear etiology.  Differential diagnosis includes insulinoma, big IGF-II syndrome, adrenal insufficiency, overdose of DM medications. Elevated C-peptide. Pending proinsulin and insulin. CT-abd/Pelvis w/o CM showed  no definitive pancreatic masses.  identified   P: - consider MRI-abd to r/o neuroendocrine tumor if hypoglycemia not resolved and insulin , pro insulin of cocern -continue IV dextrose --CBG checks / SSI Assess cortisol  BEST PRACTICE / DISPOSITION  Best practices / Disposition:  -->ICU status under PCCM  -->full code  -->lovenox for DVT Px  -->  Protonix not indicated  -->diet  -Update husband daily  Lorretta Harp, MD PGY1, Internal Medicine Teaching Service Pager: [phone]   Please see my full note separate in additin to Above resident note  Mcarthur Rossetti. Tyson Alias, MD, FACP Pgr: [phone]  Pulmonary & Critical Care

## 2011-12-19 ENCOUNTER — Inpatient Hospital Stay (HOSPITAL_COMMUNITY): Payer: BC Managed Care – PPO

## 2011-12-19 DIAGNOSIS — R7309 Other abnormal glucose: Secondary | ICD-10-CM

## 2011-12-19 DIAGNOSIS — D151 Benign neoplasm of heart: Principal | ICD-10-CM

## 2011-12-19 LAB — CULTURE, BLOOD (ROUTINE X 2): Culture  Setup Time: 201304210438

## 2011-12-19 LAB — GLUCOSE, CAPILLARY
Glucose-Capillary: 131 mg/dL — ABNORMAL HIGH (ref 70–99)
Glucose-Capillary: 107 mg/dL — ABNORMAL HIGH (ref 70–99)
Glucose-Capillary: 137 mg/dL — ABNORMAL HIGH (ref 70–99)
Glucose-Capillary: 128 mg/dL — ABNORMAL HIGH (ref 70–99)

## 2011-12-19 LAB — BASIC METABOLIC PANEL
Chloride: 100 mEq/L (ref 96–112)
Creatinine, Ser: 0.65 mg/dL (ref 0.50–1.10)
GFR calc Af Amer: >90 mL/min (ref >90)
GFR calc non Af Amer: >90 mL/min (ref >90)
Potassium: 3.7 mEq/L (ref 3.5–5.1)

## 2011-12-19 LAB — CORTISOL-AM, BLOOD: Cortisol - AM: 10.9 ug/dL (ref 4.3–22.4)

## 2011-12-19 LAB — PHOSPHORUS: Phosphorus: 3.9 mg/dL (ref 2.3–4.6)

## 2011-12-19 MED ORDER — DEXTROSE-NACL 5-0.9 % IV SOLN
INTRAVENOUS | Status: DC
  Administered 2011-12-19 – 2011-12-21 (×2): via INTRAVENOUS

## 2011-12-19 MED ORDER — HYDROCORTISONE SOD SUCCINATE 100 MG IJ SOLR
50.0000 mg | Freq: Four times a day (QID) | INTRAMUSCULAR | Status: DC
  Administered 2011-12-19 – 2011-12-23 (×17): 50 mg via INTRAVENOUS
  Filled 2011-12-19 (×20): qty 1

## 2011-12-19 NOTE — Progress Notes (Signed)
Transfer acceptance Note  HPI: Courtney Faulkner is an 61 y.o. Sociology teacher at [employer] who presented with dysarthria, aphasia and right sided weakness on April 20th, 2013. According to son, his father heard her vaccuming at ~08:30 on 12/14/2011. He came down stairs at 11:30 and found her confused and dysarthric. EMS called and found BS 36. Patient was subsequently brought to the ED. Patient was given D50 amp and juice and CBG went to 166. Patient subsequently had a head CT done which was negative. Initially a code stroke was called however it was canceled secondary to patient's hypoglycemic spells. Per family patient was recently been treated for upper respiratory symptoms with prednisone and albuterol. Patient and family deny any recent fevers, no chills, no chest pain, no diarrhea, no constipation, no dysuria, no generalized weakness. Patient's family does endorse some shortness of breath on exertion when patient climbs up stairs which has been ongoing for the past 2 weeks.   Events of Hospitalization: -recurrent hypoglycemia prompted work up for insulinoma. C-peptide was elevated and thus CT abd pelvis was done which did not show any pancreatic masses, but the study was sub-optimal and contrast MRI was suggested -markers of inflammation such as ESR, CRP were elevated which prompted rheum work-up. ANCA, ANA, Mpo/pr-3 anca, RF, anti-DNA, and compliment levels were all within normal limits -slight leukocytosis and hypoglycemia prompted infectious work-up which did show gm postive cocci in clusters -because the patient had some pulmonary edema, and DOE on admission, Echo and BNP were also checked at that time. BNP slightly elevated at 188.5 and Echo showed normal systolic function, BUT with a left atrial mass 22 x 18 mm, consistent with myxoma.   -So the initial work-up is showing left atrial mass, recurrent hypoglycemia, neurologic symptoms, with negative rheum work-up -MRI of head shows acute  non-hemorrhagic stroke of left basal ganglia. She has left anterior circulation infarcts, which neurology likely attributes to left atrial mass.  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 Plan is for resection after she receives TEE and cardiac cath to rule out CAD.  -TEE did not show any vegetations -Cardiac cath done April 24th, 2013 which did not show CAD and she is cleared for surgery -Plan is for resection of myxoma sometime next week. In the meantime she is on IV Heparin.   Studies and tests  Cardiology 1. 2D Echo 4/22 Study Conclusions - Left ventricle: The cavity size was normal. Wall thickness was increased in a pattern of mild LVH. Systolic function was normal. The estimated ejection fraction was in the range of 60% to 65%. Wall motion was normal; there were no regional wall motion abnormalities. Doppler parameters are consistent with abnormal left ventricular relaxation (grade 1 diastolic dysfunction). - Left atrium: The atrium was mildly dilated. There was a mass. There was a medium-sized, 22mm (L) x 18mm (W)mass on the left side of the interatrial septum; the appearance is consistent with myxoma.  Impressions: - Left atrial mass attached to intra-atrial septum highly suggestive of myxoma.  2. TEE 12/17/11  Results-#1 normal LV function and normal wall motion.  #2 normal left atrial size and no left atrial appendage thrombus.  #3 1.7 x 1.7 cm mass attached to the left atrial septum most likely secondary to left atrial myxoma.  #4 normal right atrium and right ventricle.  #5 no pericardial effusion  #6 mild atherosclerosis in the descending aorta  #7 trileaflet aortic valve with no aortic  insufficiency.  #8 normal mitral valve with trivial mitral regurgitation.  #9 normal tricuspid valve with trace tricuspid regurgitation.  #10 normal pulmonic valve with trace pulmonic insufficiency.  #11 no intra-atrial flow  communication by color.  Radiology 1. Ct Angio Head W/cm &/or Wo Cm 12/16/2011 *RADIOLOGY REPORT* IMPRESSION: Abnormal appearance of the left internal carotid artery with appearance of dissection with complete occlusion at the C2 level. Abnormal appearance of the proximal left common carotid artery.Prominent ectasia of the proximal right common carotid artery with fold and mild narrowing. Mild irregularity at the right carotid bifurcation without measurable stenosis. Mild to moderate narrowing proximal right vertebral artery. Mild irregularity throughout the course of the right vertebral artery. Left vertebral artery arises directly from the aortic arch. Mild narrowing proximal left vertebral artery. Evaluation of portions of the proximal left vertebral artery is limited by artifact. Nonspecific parenchymal changes lung apices greater on the right. Findings can be evaluated with a formal chest CT.   CTA HEAD IMPRESSION: Acute non hemorrhagic infarct left basal ganglia, left uncus and left corona radiata. 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 Collateral flow to the left anterior cerebral artery and left middle cerebral artery branches. The flow within the left middle cerebral artery branches is diminished suggesting that collateral flow is not adequate.   2. Chest 2 View 12/14/2011 IMPRESSION: Cardiomegaly and bilateral airspace disease most consistent with pulmonary edema.   3. Ct Head Wo Contrast 12/15/2011 *RADIOLOGY REPORT* IMPRESSION: Developing cytotoxic edema correlates with the observed pattern of restricted diffusion on MR, representing acute infarction. This primarily involves the left basal ganglia. Smaller areas of cortical and subcortical infarction seen on MRI in the left hemisphere are too small to be easily visible on CT. No incipient uncal or transtentorial herniation, although minimal 1 mm left to right shift may be present. No hemorrhagic transformation.   4. Ct Head Wo Contrast  12/14/2011 *RADIOLOGY REPORT* IMPRESSION: Normal CT of the head without contrast. Original Report Authenticated   5. Mr Brain Wo Contrast 12/14/2011 IMPRESSION: Moderate sized acute left hemisphere infarct which appears to be secondary to an acute left internal carotid artery occlusion. No visible hemorrhage or midline shift. Emboli to the left middle cerebral artery and its branches is not excluded. See comments above.   6. Dg Chest Port 1 View 12/17/2011 IMPRESSION: 1. Appearance of chest is similar to the recent prior study, again suggesting a background of mild interstitial pulmonary edema. Overall, aeration has slightly improved, with decreasing small bilateral pleural effusions.   7.) CT Chest 04/23: Respiratory motion degraded examination with poor inspiration. Scattered pulmonary parenchymal changes some which appears to represent scarring/interstitial fibrosis. Right upper lobe focal bronchiectasis. Scattered regions of mild consolidation may represent scarring although difficult to exclude superimposed infection. Follow up as noted above.  CT abd/pelvis 4/23: On this unenhanced examination, no definitive pancreatic masses identified however, evaluation without contrast is markedly  limited. For detection of a neuroendocrine tumor, preferable examination is contrast enhanced MR. If the patient cannot hold her breath, CT with contrast with pancreatic protocol can be obtained. Very small amount of free fluid. Etiology indeterminate.  Labs: Comprehensive Metabolic Panel:    Component Value Date/Time   NA 137 12/19/2011 0500   K 3.7 12/19/2011 0500   CL 100 12/19/2011 0500   CO2 26 12/19/2011 0500   BUN 10 12/19/2011 0500   CREATININE 0.65 12/19/2011 0500   GLUCOSE 95 12/19/2011 0500   CALCIUM 9.3 12/19/2011 0500   AST 25 12/18/2011 0303  ALT 21 12/18/2011 0303   ALKPHOS 54 12/18/2011 0303   BILITOT 0.4 12/18/2011 0303   PROT 6.1 12/18/2011 0303   ALBUMIN 2.5* 12/18/2011 0303    CBC:     Component Value Date/Time   WBC 8.8 12/18/2011 0303   HGB 11.9* 12/18/2011 0303   HCT 36.8 12/18/2011 0303   PLT 242 12/18/2011 0303   MCV 94.6 12/18/2011 0303   NEUTROABS 5.7 12/18/2011 0303   LYMPHSABS 1.9 12/18/2011 0303   MONOABS 0.9 12/18/2011 0303   EOSABS 0.3 12/18/2011 0303   BASOSABS 0.1 12/18/2011 0303    CBG (last 3)   Basename 12/19/11 0834 12/19/11 0612 12/19/11 0406  GLUCAP 106* 97 131*    Assessment and Plan:  This is a 61 year old woman with no known past medical history admitted for altered mental status.   1.) Left Atrial Mass - probably atrial myxoma. Cleared for surgery. Anticipate surgery next week. Management per CVTS  2.) Acute Stroke - left ant circulation stroke, likely related to atrial myxoma. Continue IV Heparin per neuro  3.) Recurrent hypoglycemia - exact etiology still unknown. C-peptide level elevated, insulinoma on differential. CT abd did not show any pancreatic masses, but may need another study such as contrasted MRI abd to definitively rule out neuroendocrine tumor.   4.) Bacteremia - gram positive cocci in clusters, likely staph, but with unclear source. May be from infected myxoma. TEE with no vegetations. Patient on Vanc and Rocephin. Blood cultures are being repeated to narrow coverage.   Melida Quitter - PGY-3

## 2011-12-19 NOTE — Progress Notes (Signed)
Patient Name: Courtney Faulkner Date of Encounter: 12/19/2011    SUBJECTIVE: She had a good night and denies chest pain and dyspnea.  TELEMETRY:  NSR Filed Vitals:   12/19/11 0700 12/19/11 0800 12/19/11 0900 12/19/11 1000  BP: 98/50 93/54 96/71  98/55  Pulse: 73 71 101 83  Temp: 99.5 F (37.5 C) 99.5 F (37.5 C) 99.3 F (37.4 C) 99.7 F (37.6 C)  TempSrc: Core (Comment) Core (Comment)    Resp: 25 24 28 28   Height:      Weight:      SpO2: 97% 99% 94% 99%    Intake/Output Summary (Last 24 hours) at 12/19/11 1033 Last data filed at 12/19/11 0900  Gross per 24 hour  Intake   2097 ml  Output   2365 ml  Net   -268 ml    LABS: Basic Metabolic Panel:  Basename 12/19/11 0500 12/18/11 0700  NA 137 135  K 3.7 3.2*  CL 100 97  CO2 26 28  GLUCOSE 95 98  BUN 10 9  CREATININE 0.65 0.69  CALCIUM 9.3 9.1  MG 1.9 --  PHOS 3.9 --   CBC:  Basename 12/18/11 0303 12/17/11 0300  WBC 8.8 9.2  NEUTROABS 5.7 5.7  HGB 11.9* 11.4*  HCT 36.8 35.3*  MCV 94.6 94.6  PLT 242 240    Radiology/Studies:  none  Physical Exam: Blood pressure 98/55, pulse 83, temperature 99.7 F (37.6 C), temperature source Core (Comment), resp. rate 28, height 5\' 3"  (1.6 m), weight 91.4 kg (201 lb 8 oz), SpO2 99.00%. Weight change:    No gallop or murmur. Left groin cath site is ok  ASSESSMENT:  1. Left atrial myxoma  2. Presumed embolic stroke. The left carotid is occluded either due to embolus or carotid dissection  3. S/p cath without complications.   Plan:  1. Positive blood cultures are concerning. Continue antibiotic. Could this be a contaminant?  2. Should we start IV hepatin? Unclear at this time. If safe neuro -wise, I would favor anticoagulation if there is a chance the carotid process is a dissection.  Selinda Eon 12/19/2011, 10:33 AM

## 2011-12-19 NOTE — Progress Notes (Signed)
ANTIBIOTIC CONSULT NOTE - FOLLOW UP  Pharmacy Consult for vancomycin and ceftriaxone Indication: r/o endocarditis  No Known Allergies  Patient Measurements: Height: 5\' 3"  (160 cm) Weight: 201 lb 8 oz (91.4 kg) IBW/kg (Calculated) : 52.4  Adjusted Body Weight:   Vital Signs: Temp: 99.3 F (37.4 C) (04/25 1200) Temp src: Core (Comment) (04/25 1200) BP: 97/48 mmHg (04/25 1200) Pulse Rate: 85  (04/25 1200) Intake/Output from previous day: 04/24 0701 - 04/25 0700 In: 2127 [I.V.:1177; IV Piggyback:950] Out: 2325 [Urine:2325] Intake/Output from this shift: Total I/O In: 637.5 [P.O.:300; I.V.:137.5; IV Piggyback:200] Out: 295 [Urine:295]  Labs:  Basename 12/19/11 0500 12/18/11 0700 12/18/11 0303 12/17/11 0300  WBC -- -- 8.8 9.2  HGB -- -- 11.9* 11.4*  PLT -- -- 242 240  LABCREA -- -- -- --  CREATININE 0.65 0.69 0.70 --   Estimated Creatinine Clearance: 81.3 ml/min (by C-G formula based on Cr of 0.65). No results found for this basename: VANCOTROUGH:2,VANCOPEAK:2,VANCORANDOM:2,GENTTROUGH:2,GENTPEAK:2,GENTRANDOM:2,TOBRATROUGH:2,TOBRAPEAK:2,TOBRARND:2,AMIKACINPEAK:2,AMIKACINTROU:2,AMIKACIN:2, in the last 72 hours   Microbiology: Recent Results (from the past 720 hour(s))  CULTURE, BLOOD (ROUTINE X 2)     Status: Normal (Preliminary result)   Collection Time   12/14/11  4:14 PM      Component Value Range Status Comment   Specimen Description BLOOD ARM RIGHT   Final    Special Requests BOTTLES DRAWN AEROBIC AND ANAEROBIC 10CC   Final    Culture  Setup Time [id number]   Final    Culture     Final    Value:        BLOOD CULTURE RECEIVED NO GROWTH TO DATE CULTURE WILL BE HELD FOR 5 DAYS BEFORE ISSUING A FINAL NEGATIVE REPORT   Report Status PENDING   Incomplete   CULTURE, BLOOD (ROUTINE X 2)     Status: Normal   Collection Time   12/14/11  5:45 PM      Component Value Range Status Comment   Specimen Description BLOOD RIGHT ARM   Final    Special Requests BOTTLES DRAWN AEROBIC  AND ANAEROBIC 10CC   Final    Culture  Setup Time [id number]   Final    Culture     Final    Value: STAPHYLOCOCCUS SPECIES (COAGULASE NEGATIVE)     Note: THE SIGNIFICANCE OF ISOLATING THIS ORGANISM FROM A SINGLE SET OF BLOOD CULTURES WHEN MULTIPLE SETS ARE DRAWN IS UNCERTAIN. PLEASE NOTIFY THE MICROBIOLOGY DEPARTMENT WITHIN ONE WEEK IF SPECIATION AND SENSITIVITIES ARE REQUIRED.     Note: Gram Stain Report Called to,Read Back By and Verified With: ELAINE THIELEN @1057  12/17/11 BY KRAWS   Report Status 12/19/2011 FINAL   Final   MRSA PCR SCREENING     Status: Normal   Collection Time   12/14/11  5:52 PM      Component Value Range Status Comment   MRSA by PCR NEGATIVE  NEGATIVE  Final   CULTURE, BLOOD (ROUTINE X 2)     Status: Normal (Preliminary result)   Collection Time   12/17/11 12:50 PM      Component Value Range Status Comment   Specimen Description BLOOD LEFT FOREARM   Final    Special Requests     Final    Value: BOTTLES DRAWN AEROBIC AND ANAEROBIC 4CC RED  6CC BLUE   Culture  Setup Time [id number]   Final    Culture     Final    Value:        BLOOD CULTURE RECEIVED NO GROWTH TO DATE  CULTURE WILL BE HELD FOR 5 DAYS BEFORE ISSUING A FINAL NEGATIVE REPORT   Report Status PENDING   Incomplete   CULTURE, BLOOD (ROUTINE X 2)     Status: Normal (Preliminary result)   Collection Time   12/17/11  1:00 PM      Component Value Range Status Comment   Specimen Description BLOOD RIGHT ARM   Final    Special Requests BOTTLES DRAWN AEROBIC ONLY 1.5CC   Final    Culture  Setup Time [id number]   Final    Culture     Final    Value:        BLOOD CULTURE RECEIVED NO GROWTH TO DATE CULTURE WILL BE HELD FOR 5 DAYS BEFORE ISSUING A FINAL NEGATIVE REPORT   Report Status PENDING   Incomplete     Anti-infectives     Start     Dose/Rate Route Frequency Ordered Stop   12/17/11 1800   cefTRIAXone (ROCEPHIN) 2 g in dextrose 5 % 50 mL IVPB        2 g 100 mL/hr over 30 Minutes Intravenous Every 24  hours 12/16/11 1827     12/17/11 0400   vancomycin (VANCOCIN) IVPB 1000 mg/200 mL premix        1,000 mg 200 mL/hr over 60 Minutes Intravenous Every 8 hours 12/16/11 1827     12/16/11 1900   cefTRIAXone (ROCEPHIN) 2 g in dextrose 5 % 50 mL IVPB        2 g 100 mL/hr over 30 Minutes Intravenous NOW 12/16/11 1827 12/16/11 2058   12/16/11 1900   vancomycin (VANCOCIN) IVPB 1000 mg/200 mL premix        1,000 mg 200 mL/hr over 60 Minutes Intravenous NOW 12/16/11 1827 12/16/11 2139          Assessment: 67 YOF admitted with an acute L CVA and ECHO performed for stroke evaluation revealed a left atrial mass attached to intra-atrial septum (highly suggestive of myxoma) and the patient is on Vancomycin + Rocephin for empiric endocarditis coverage.  S/p cath with normal coronary arteries but atrial myxoma present, plan for TCTS to remove.   4/20: BC = CoNS 1/2 4/23 BC = NGTD  Vancomycin 4/22 >> Ceftriaxone 4/22 >>  Goal of Therapy:  Vancomycin trough level 15-20 mcg/ml  Plan:  1. Continue vancomycin 1gm IV q8h as renal fx is stable 2. Check trough 4/26 as dose schedule off d/t cath and will wait until at steady state 3. Continue Ceftriaxone 2gm IV q24h  Dannielle Huh 12/19/2011,3:24 PM

## 2011-12-19 NOTE — Progress Notes (Signed)
UR Completed.  [name] [name] [phone] 12/19/2011  

## 2011-12-19 NOTE — Progress Notes (Signed)
Stroke Team Progress Note  HISTORY Courtney Faulkner is an 61 y.o. female with dysarthria, aphasia and right sided weakness. According to son at bedside, his father heard her vaccuming at ~08:30 on 12/14/2011. He came down stairs at 11:30 and found her confused and dysarthric. EMS called and found BS 36. Patient was subsequently brought to the ED. She arrive at 1214. In the ED patient was given some orange juice and her blood CBG went to 166. Patient subsequently had a head CT done which was negative. Initially a code stroke was called however it was canceled secondary to patient's hypoglycemic spells. Per family patient was recently been treated for upper respiratory symptoms with prednisone and albuterol. Patient and family deny any recent fevers, no chills, no chest pain, no diarrhea, no constipation, no dysuria, no generalized weakness. Patient's family does endorse some shortness of breath on exertion when patient climbs up stairs which has been ongoing for the past 2 weeks. On exam and during the interview patient has some dysarthric speech is alert to self only and has an incoherent speech as well. After treatment, CBG 166, pt remains confused, and having difficulty following commands.  Neuro consult called by Dr. Janee Morn for this reason. Patient was not a TPA candidate secondary to presenting beyond the tPA window. She was transferred to the ICU after admission for hypoglycemia and neuro decline for further evaluation and treatment.  SUBJECTIVE Stable no changes. Speech improving but not back to baseline. Dr Tyrone Sage plans open heart surgery on 12/24/11 for tumor excision.  OBJECTIVE Most recent Vital Signs: Filed Vitals:   12/19/11 0400 12/19/11 0500 12/19/11 0600 12/19/11 0700  BP: 102/56 105/53 98/58 98/50   Pulse: 66 72 80 73  Temp: 99.3 F (37.4 C) 99.3 F (37.4 C) 99.3 F (37.4 C) 99.5 F (37.5 C)  TempSrc: Core (Comment) Core (Comment) Core (Comment) Core (Comment)  Resp: 20 24 26 25     Height:      Weight:      SpO2: 99% 99% 100% 97%   CBG (last 3)  Basename 12/19/11 0612 12/19/11 0406 12/19/11 0208  GLUCAP 97 131* 107*   Intake/Output from previous day: 04/24 0701 - 04/25 0700 In: 2127 [I.V.:1177; IV Piggyback:950] Out: 2325 [Urine:2325]  IV Fluid Intake:     . sodium chloride Stopped (12/18/11 1900)  . dextrose 30 mL/hr at 12/18/11 1900  . DISCONTD: sodium chloride     MEDICATIONS    . antiseptic oral rinse  15 mL Mouth Rinse BID  . aspirin EC  325 mg Oral Daily  . aspirin  300 mg Rectal Daily   Or  . aspirin  325 mg Oral Daily  . cefTRIAXone (ROCEPHIN)  IV  2 g Intravenous Q24H  . enoxaparin  40 mg Subcutaneous Q24H  . fentaNYL      . heparin      . lidocaine      . midazolam      . nitroGLYCERIN      . potassium chloride  10 mEq Intravenous Q1 Hr x 4  . vancomycin  1,000 mg Intravenous Q8H  . DISCONTD: sodium chloride  3 mL Intravenous Q12H   PRN:  acetaminophen, acetaminophen, dextrose, ondansetron (ZOFRAN) IV, ondansetron (ZOFRAN) IV, DISCONTD: sodium chloride, DISCONTD: sodium chloride  Diet:  General thin liquids Activity:  Bedrest, Bathroom privileges DVT Prophylaxis:  Lovenox 40 mg sq daily   CLINICALLY SIGNIFICANT STUDIES CBC    Component Value Date/Time   WBC 8.8 12/18/2011 0303  RBC 3.89 12/18/2011 0303   HGB 11.9* 12/18/2011 0303   HCT 36.8 12/18/2011 0303   PLT 242 12/18/2011 0303   MCV 94.6 12/18/2011 0303   MCH 30.6 12/18/2011 0303   MCHC 32.3 12/18/2011 0303   RDW 14.2 12/18/2011 0303   LYMPHSABS 1.9 12/18/2011 0303   MONOABS 0.9 12/18/2011 0303   EOSABS 0.3 12/18/2011 0303   BASOSABS 0.1 12/18/2011 0303   CMP    Component Value Date/Time   NA 135 12/18/2011 0700   K 3.2* 12/18/2011 0700   CL 97 12/18/2011 0700   CO2 28 12/18/2011 0700   GLUCOSE 98 12/18/2011 0700   BUN 9 12/18/2011 0700   CREATININE 0.69 12/18/2011 0700   CALCIUM 9.1 12/18/2011 0700   PROT 6.1 12/18/2011 0303   ALBUMIN 2.5* 12/18/2011 0303   AST 25 12/18/2011  0303   ALT 21 12/18/2011 0303   ALKPHOS 54 12/18/2011 0303   BILITOT 0.4 12/18/2011 0303   GFRNONAA >90 12/18/2011 0700   GFRAA >90 12/18/2011 0700   COAGS Lab Results  Component Value Date   INR 1.04 12/17/2011   Lipid Panel    Component Value Date/Time   CHOL 134 12/16/2011 0311   TRIG 90 12/16/2011 0311   HDL 45 12/16/2011 0311   CHOLHDL 3.0 12/16/2011 0311   VLDL 18 12/16/2011 0311   LDLCALC 71 12/16/2011 0311   HgbA1C  Lab Results  Component Value Date   HGBA1C 5.1 12/16/2011   Cardiac Panel (last 3 results)  No results found for this basename: CKTOTAL:3,CKMB:3,TROPONINI:3,RELINDX:3 in the last 72 hours Urinalysis    Component Value Date/Time   COLORURINE YELLOW 12/14/2011 1255   APPEARANCEUR CLEAR 12/14/2011 1255   LABSPEC 1.015 12/14/2011 1255   PHURINE 7.5 12/14/2011 1255   GLUCOSEU 250* 12/14/2011 1255   HGBUR NEGATIVE 12/14/2011 1255   BILIRUBINUR NEGATIVE 12/14/2011 1255   KETONESUR NEGATIVE 12/14/2011 1255   PROTEINUR NEGATIVE 12/14/2011 1255   UROBILINOGEN 0.2 12/14/2011 1255   NITRITE NEGATIVE 12/14/2011 1255   LEUKOCYTESUR NEGATIVE 12/14/2011 1255   Urine Drug Screen  No results found for this basename: labopia,  cocainscrnur,  labbenz,  amphetmu,  thcu,  labbarb    Alcohol Level No results found for this basename: eth   VITAMIN B12     Status: Normal   Collection Time   12/15/11  9:00 AM      Component Value Range   Vitamin B-12 287  211 - 911 (pg/mL)  IRON AND TIBC     Status: Abnormal   Collection Time   12/15/11  9:00 AM      Component Value Range   Iron 39 (*) 42 - 135 (ug/dL)   TIBC 782 (*) 956 - 213 (ug/dL)   Saturation Ratios 17 (*) 20 - 55 (%)   UIBC 192  125 - 400 (ug/dL)  FERRITIN     Status: Normal   Collection Time   12/15/11  9:00 AM      Component Value Range   Ferritin 200  10 - 291 (ng/mL)  RETICULOCYTES     Status: Normal   Collection Time   12/15/11  9:00 AM      Component Value Range   Retic Ct Pct 2.9  0.4 - 3.1 (%)   RBC. 3.97  3.87 -  5.11 (MIL/uL)   Retic Count, Manual 115.1  19.0 - 186.0 (K/uL)  PRO B NATRIURETIC PEPTIDE     Status: Abnormal   Collection Time   12/15/11  9:00 AM      Component Value Range   Pro B Natriuretic peptide (BNP) 441.6 (*) 0 - 125 (pg/mL)   CT of the brain   12/15/2011  Developing cytotoxic edema correlates with the observed pattern of restricted diffusion on MR, representing acute infarction. This primarily involves the left basal ganglia.  Smaller areas of cortical and subcortical infarction seen on MRI in the left hemisphere are too small to be easily visible on CT.   No incipient uncal or transtentorial herniation, although minimal 1 mm left to right shift may be present.  No hemorrhagic transformation.    12/14/2011  Normal CT of the head without contrast.   CT Angio Head 12/16/2011  Acute non hemorrhagic infarct left basal ganglia, left uncus and left corona radiata.  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  Collateral flow to the left anterior cerebral artery and left middle cerebral artery branches.  The flow within the left middle cerebral artery branches is diminished suggesting that collateral flow is not adequate.   CT Angio Neck 12/16/2011  Abnormal appearance of the left internal carotid artery with appearance of dissection with complete occlusion at the C2 level.  Abnormal appearance of the proximal left common carotid artery. Question if this is related to artifact versus dissection versus atherosclerotic type changes.  Mild narrowing proximal right common carotid artery.  Prominent ectasia of the proximal right common carotid artery with fold and mild narrowing.  Mild irregularity at the right carotid bifurcation without measurable stenosis.  Mild to moderate narrowing proximal right vertebral artery.  Mild irregularity throughout the course of the right vertebral artery.  Left vertebral artery arises directly from the aortic arch. Mild narrowing proximal left vertebral artery.  Evaluation  of portions of the proximal left vertebral artery is limited by artifact.  Nonspecific parenchymal changes lung apices greater on the right. Findings can be evaluated with a formal chest CT.   MRI of the brain  12/14/2011  Moderate sized acute left hemisphere infarct which appears to be secondary to an acute left internal carotid artery occlusion. No visible hemorrhage or midline shift.  Emboli to the left middle cerebral artery and its branches is not excluded.  MRA of the brain  See CT angio  2D Echocardiogram  EF 60-65% with no source of embolus.  Left atrial mass attached to intra-atrial septum highly suggestive of myxoma.  Carotid Doppler  Right: no ICA stenosis. Left: ICA appears occluded. Bilateral vertebral artery flow is antegrade.  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  CXR  12/18/2011 Some increase in airspace disease likely due to progressive edema. 12/16/2011  Appearance of chest is similar to the recent prior study, again suggesting a background of mild interstitial pulmonary edema. Overall, aeration has slightly improved, with decreasing small bilateral pleural effusions. 12/14/2011   Cardiomegaly and bilateral airspace disease most consistent with pulmonary edema.     CT chest 12/17/2011 Respiratory motion degraded examination with poor inspiration. Scattered pulmonary parenchymal changes some which appears to represent scarring/interstitial fibrosis. Right upper lobe focal bronchiectasis. Scattered regions of mild consolidation may represent scarring although difficult to exclude superimposed infection  CT Abdomen and Pelvis 12/17/2011 On this unenhanced examination, no definitive pancreatic masses identified however, evaluation without contrast is markedly limited. For detection of a neuroendocrine tumor, preferable examination is contrast enhanced MR. If the patient cannot hold her breath, CT with contrast with  pancreatic protocol can  be obtained. Very small amount of free fluid. Etiology indeterminate.  Cardiac Cath 12/18/2011  1. Normal coronary arteries. 2. Normal abdominal aorta, iliac and femoral arteries. 3. Left atrial myxoma identified by neovascularization during right coronary angiography. 4. Left ventriculography was not performed.  EKG  normal sinus rhythm.   Physical Exam   Pleasant middle-age [place] American lady currently not in distress.Awake alert. Afebrile. Head is nontraumatic. Neck is supple without bruit. Hearing is normal. Cardiac exam no murmur or gallop. Lungs are clear to auscultation. Distal pulses are well felt.  Neurological Exam ; awake alert mild expressive aphasia with word finding difficulties and paraphasias. Good  And repitition but poor naming.. Able to follow commands well. Extraocular movements are full range without nystagmus. No visual field defect to bedside confrontational testing. Mild right lower facial weakness. Tongue is midline. Motor system exam reveals mild weakness in the right grip and intrinsic hand muscles. Good right lower extremity strength without drift. The minimum right hip flexion and ankle dorsiflexion weakness. Sensation slightly diminished on the right compared to the left. Coordination is slow but I correct. Gait was not tested.  ASSESSMENT Courtney Faulkner is a 61 y.o. 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 Neuro improvement since admission. On no antiplatelets prior to admission. Now on aspirin 325 mg orally every day for secondary stroke prevention. Patient with resultant right hemiparesis and expressive aphasia with improvement. Ok for IV heparin from neuro standpoint. Patient is at risk for additional neuro events.  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 Cath clear for OR. -No sign of endocarditis on TEE, on Vanc and  rocephin started 12/16/2011 -left ICA occlusion suspect secondary to myxoma emboli vs dissection -tachycardia -hypoglycemia, recurrent, ? Etiology, Insulinoma not confirmed on CT. Workup underway -Nonspecific parenchymal changes lung apices greater on the right. With progressive pulmonary edema today. -elevated cardiac enzymes, cardiac related.  -short PR -obesity, class 2, Body mass index is 35.69 kg/(m^2).   Hospital day # 5  TREATMENT/PLAN -surgery to remove myxoma. Agree with plans for next Tues -ok for IV heparin from neuro standpoint for OR next Tues. D/w Dr Tyson Alias -ok to mobilize out of bed and transfer to telemetry floor bed.  Joaquin Music, ANP-BC, GNP-BC [facility] Pager: [phone] 12/19/2011 7:50 AM  Dr. Delia Heady, Stroke Center Medical Director, has personally reviewed chart, pertinent data, examined the patient and developed the plan of care.  Pager: [phone]

## 2011-12-19 NOTE — Progress Notes (Signed)
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 MRI revealed moderate lt hemispheric infarct. 4/21 she again had hypogylcemia and change in speech pattern. She was moved to NSICU and PCCM asked to assume her care. She is being followed by stroke team. Dx likely left atrial myxoma   Lines / Drains: 4/20 PIV>>>  Cultures: 4.20 bc x 2>> Gram positive cocci in clusters 4/23 BC x 2>>>  Antibiotics: vanc 4/22>>>  Ceftriaxone 4/22>>>  Tests / Events: -Blood culture is positive for  Gram positive cocci in clusters.  -TEE: 1.7 x 1.7 cm mass attached to the left atrial septum most likely secondary to left atrial myxoma. normal LV function and normal wall motion. -Lab: negative ANA, RF, C3, CA19-9 and sickel cell screen; elevated Crp 4.85  and ESR46.  -CT-abd/Pelvis w/o CM showed  no definitive pancreatic masses identified  -CT-chest:  scarring/interstitial fibrosis in lung. Right upper lobe focal, bronchiectasis. Scattered regions of mild consolidation may represent scarring although difficult to exclude superimposed infection. Cardiomegaly. Ectatic Ascending thoracic aorta measures up to 3.8 cm. 4/25: cardiac cath showed no coronary arterial disease.  Vital Signs: Temp:  [98.6 F (37 C)-99.5 F (37.5 C)] 99.3 F (37.4 C) (04/25 0500) Pulse Rate:  [52-98] 72  (04/25 0500) Resp:  [14-33] 24  (04/25 0500) BP: (94-115)/(42-66) 105/53 mmHg (04/25 0500) SpO2:  [94 %-100 %] 99 % (04/25 0500) I/O last 3 completed shifts: In: 3097.3 [P.O.:240; I.V.:1653.3; IV Piggyback:1204] Out: 6157 [Urine:6157]  Physical Examination:  General: WNWD NAD, flat affect  Neuro: alert and oriented X 3, follows commands , improved strength.slight weakness on the right side. Still has slurry  speech. HEENT: No jvd  Cardiovascular: s1 s2 rrr no r  Lungs: cta  Abdomen: +bs obese, NT ND  Musculoskeletal: intact  Skin: warm  Ventilator settings: not on ventilator   Labs and Imaging:  Reviewed.  Please refer to the Assessment and Plan section for relevant results.  ASSESSMENT AND PLAN  NEUROLOGIC A:  New lt stroke, muscle strength improved. TEE did not show vegetation. Positive blood culture P: -per stroke team, improved.  -continue ASA -heparin per neuro, likely no concerns with this -PT/OT  Left atrial Myxoma:  A: TEE showed 1.7 x 1.7 cm mass attached to the left atrial septum most likely secondary to left atrial myxoma. P:  - Appreciate CVTS guidance  -possible surgery on Tue -keep tele  PULMONARY A:  [place] currently. Lung is clear to ausculation. O2 Sat is good. CT- chest showed scarring/interstitial fibrosis in lung. Right upper lobe focal, bronchiectasis. Scattered regions of mild consolidation may represent scarring although difficult to exclude superimposed infection.  P: - observe. Mobilize  CARDIOVASCULAR  Lab 12/16/11 0311 12/15/11 2005 12/15/11 1252 12/15/11 0900 12/14/11 2227 12/14/11 1456  TROPONINI 0.98* 0.65* 1.41* 1.48* 1.34* --  LATICACIDVEN -- -- -- -- -- --  PROBNP -- -- -- 441.6* -- 188.5*   A:  No chest pain, but with elevated troponin. Cath reviewed P: -appreciate cardiolog's help in managing our patient -continue ASA cath reviewed, will alter CVTS approach as no CAD Keep tele  RENAL  Lab 12/18/11 0700 12/18/11 0303 12/17/11 2248 12/17/11 1906 12/17/11 1314 12/14/11 1456  NA 135 137 136 135 137 --  K 3.2* 3.6 -- -- -- --  CL 97 99 99 100 102 --  CO2 28 28 25 26 26  --  BUN 9 9 10 6 6  --  CREATININE 0.69 0.70 0.78 0.69 0.63 --  CALCIUM 9.1 8.9 9.0 9.0 9.1 --  MG -- -- -- -- -- 1.9  PHOS -- -- -- -- -- --   A:  No issue. hypotassium repleted  4/24, neg balance noted P: -BMP in AM k wnl To d5 ns for hypoglcyemia Dc  foley  GASTROINTESTINAL  Lab 12/18/11 0303 12/17/11 0300 12/15/11 0900 12/14/11 1236  AST 25 24 33 30  ALT 21 19 25 27   ALKPHOS 54 50 54 52  BILITOT 0.4 0.3 0.4 0.3  PROT 6.1 5.6* 6.3 5.9*  ALBUMIN 2.5* 2.3* 2.7* 2.7*   A:  No significant issue. Slightly decreased albumin. P: -->lft in future Tolerating heart healthy diet  HEMATOLOGIC  Lab 12/18/11 0303 12/17/11 1906 12/17/11 0300 12/16/11 0311 12/15/11 0900 12/14/11 1742  HGB 11.9* -- 11.4* 11.2* 12.2 11.3*  HCT 36.8 -- 35.3* 34.7* 38.0 35.7*  PLT 242 -- 240 261 306 268  INR -- 1.04 -- -- -- --  APTT -- -- -- -- -- --   A:  Anemia unclear etiology. Normal Vb12 and ferritin. Likely due to chronic disease. Stable Hgb.  P: -->  Levenox, likely beneficial in setting tumor  INFECTIOUS  Lab 12/18/11 0303 12/17/11 0300 12/16/11 0311 12/15/11 0900 12/14/11 1742  WBC 8.8 9.2 8.9 7.6 10.5  PROCALCITON -- -- -- -- --   A:  positive blood culture for gram positive cocci in cluster, likely staph. Unclear source, possibly from infected myxoma. TEE no vegetation. P: -->  Continue vancomycin and ceftriaxone --> BC clusters may be contaminant, but had persistent hypoglcyemia,  --> in am if repeat BC neg, would dc vanc -PCT neg , consider algorithm to dc ceftriaxone as well over next 3 days  ENDOCRINE  Lab 12/19/11 0406 12/19/11 0208 12/18/11 2326 12/18/11 2215 12/18/11 1935  GLUCAP 131* 107* 107* 107* 106*   A:  Hypoglycemia. Unclear etiology.  Differential diagnosis includes insulinoma, big IGF-II syndrome, adrenal insufficiency, overdose of DM medications ( on none). Elevated C-peptide. Pending proinsulin and insulin and sulfonylurea hypoglycemia panel.  CT-abd/Pelvis w/o CM showed  no definitive pancreatic masses identified but could not give contrast then. CBG has been imrpoved  P: - consider MRI-abd to r/o neuroendocrine tumor in future -continue IV dextrose to d5 from d10, as diet tolerated --CBG checks / SSI q2h still then  to q4h -change CBG check from q2h to q4h today likely Low threshold to add empiric hydrocortisone until cortisol back See ID to discuss management possible veg on myxoma  BEST PRACTICE / DISPOSITION  Best practices / Disposition:  -->ICU status under PCCM, will consider transition to teaching service -->full code  -->lovenox for DVT Px  -->Protonix not indicated  -->diet  -Update pt , will call husband  Transfer to tele  Lorretta Harp, MD PGY1, Internal Medicine Teaching Service Pager: [phone]  I have fully examined this patient and agree with above findings.    And edited in full  Mcarthur Rossetti. Tyson Alias, MD, FACP Pgr: [phone] [facility]

## 2011-12-20 ENCOUNTER — Inpatient Hospital Stay (HOSPITAL_COMMUNITY): Payer: BC Managed Care – PPO

## 2011-12-20 DIAGNOSIS — D487 Neoplasm of uncertain behavior of other specified sites: Secondary | ICD-10-CM

## 2011-12-20 DIAGNOSIS — I6350 Cerebral infarction due to unspecified occlusion or stenosis of unspecified cerebral artery: Secondary | ICD-10-CM

## 2011-12-20 LAB — COMPREHENSIVE METABOLIC PANEL
ALT: 30 U/L (ref 0–35)
Alkaline Phosphatase: 54 U/L (ref 39–117)
BUN: 18 mg/dL (ref 6–23)
Chloride: 101 mEq/L (ref 96–112)
GFR calc Af Amer: >90 mL/min (ref >90)
Glucose, Bld: 119 mg/dL — ABNORMAL HIGH (ref 70–99)
Potassium: 3.8 mEq/L (ref 3.5–5.1)
Sodium: 137 mEq/L (ref 135–145)
Total Bilirubin: 0.2 mg/dL — ABNORMAL LOW (ref 0.3–1.2)

## 2011-12-20 LAB — BASIC METABOLIC PANEL
Chloride: 100 mEq/L (ref 96–112)
Creatinine, Ser: 0.58 mg/dL (ref 0.50–1.10)
GFR calc Af Amer: >90 mL/min (ref >90)
GFR calc non Af Amer: >90 mL/min (ref >90)

## 2011-12-20 LAB — CBC
MCHC: 33.5 g/dL (ref 30.0–36.0)
MCV: 93 fL (ref 78.0–100.0)
Platelets: 272 10*3/uL (ref 150–400)
RDW: 13.7 % (ref 11.5–15.5)
WBC: 6.7 10*3/uL (ref 4.0–10.5)

## 2011-12-20 LAB — GLUCOSE, CAPILLARY
Glucose-Capillary: 107 mg/dL — ABNORMAL HIGH (ref 70–99)
Glucose-Capillary: 110 mg/dL — ABNORMAL HIGH (ref 70–99)
Glucose-Capillary: 110 mg/dL — ABNORMAL HIGH (ref 70–99)
Glucose-Capillary: 118 mg/dL — ABNORMAL HIGH (ref 70–99)
Glucose-Capillary: 123 mg/dL — ABNORMAL HIGH (ref 70–99)

## 2011-12-20 LAB — ANCA SCREEN W REFLEX TITER
Atypical p-ANCA Screen: NEGATIVE
c-ANCA Screen: NEGATIVE
p-ANCA Screen: NEGATIVE

## 2011-12-20 LAB — TYPE AND SCREEN: Antibody Screen: NEGATIVE

## 2011-12-20 LAB — CULTURE, BLOOD (ROUTINE X 2): Culture: NO GROWTH

## 2011-12-20 MED ORDER — CHLORHEXIDINE GLUCONATE 4 % EX LIQD: 60.0000 mL | Freq: Once | CUTANEOUS | Status: DC

## 2011-12-20 MED ORDER — METOPROLOL TARTRATE 12.5 MG HALF TABLET: 12.5000 mg | ORAL_TABLET | Freq: Once | ORAL | Status: DC

## 2011-12-20 MED ORDER — CHLORHEXIDINE GLUCONATE 4 % EX LIQD
60.0000 mL | Freq: Once | CUTANEOUS | Status: AC
  Administered 2011-12-24: 4 via TOPICAL
  Filled 2011-12-20: qty 60

## 2011-12-20 MED ORDER — POTASSIUM CHLORIDE CRYS ER 20 MEQ PO TBCR
40.0000 meq | EXTENDED_RELEASE_TABLET | Freq: Once | ORAL | Status: AC
  Administered 2011-12-20: 40 meq via ORAL
  Filled 2011-12-20: qty 2

## 2011-12-20 MED ORDER — CHLORHEXIDINE GLUCONATE 4 % EX LIQD
60.0000 mL | Freq: Once | CUTANEOUS | Status: AC
  Filled 2011-12-20: qty 60

## 2011-12-20 MED ORDER — TEMAZEPAM 15 MG PO CAPS: 15.0000 mg | ORAL_CAPSULE | Freq: Once | ORAL | Status: DC | PRN

## 2011-12-20 MED ORDER — TEMAZEPAM 15 MG PO CAPS: 15.0000 mg | ORAL_CAPSULE | Freq: Once | ORAL | Status: AC | PRN

## 2011-12-20 MED ORDER — BISACODYL 5 MG PO TBEC
5.0000 mg | DELAYED_RELEASE_TABLET | Freq: Once | ORAL | Status: DC
  Filled 2011-12-20: qty 1

## 2011-12-20 MED ORDER — BISACODYL 5 MG PO TBEC
5.0000 mg | DELAYED_RELEASE_TABLET | Freq: Once | ORAL | Status: AC
  Administered 2011-12-23: 5 mg via ORAL
  Filled 2011-12-20: qty 1

## 2011-12-20 MED ORDER — AMIODARONE HCL 200 MG PO TABS
200.0000 mg | ORAL_TABLET | Freq: Two times a day (BID) | ORAL | Status: DC
  Administered 2011-12-20 – 2011-12-23 (×7): 200 mg via ORAL
  Filled 2011-12-20 (×11): qty 1

## 2011-12-20 MED ORDER — ASPIRIN EC 81 MG PO TBEC
81.0000 mg | DELAYED_RELEASE_TABLET | Freq: Every day | ORAL | Status: DC
  Administered 2011-12-21 – 2011-12-23 (×3): 81 mg via ORAL
  Filled 2011-12-20 (×4): qty 1

## 2011-12-20 MED ORDER — CHLORHEXIDINE GLUCONATE 4 % EX LIQD
60.0000 mL | Freq: Once | CUTANEOUS | Status: AC
  Administered 2011-12-23: 4 via TOPICAL
  Filled 2011-12-20: qty 60

## 2011-12-20 NOTE — Evaluation (Signed)
Physical Therapy Evaluation Patient Details Name: Courtney Faulkner MRN: [id number] DOB: 1950-11-11 Today's Date: 12/20/2011 Time: 4098-1191 PT Time Calculation (min): 33 min  PT Assessment / Plan / Recommendation Clinical Impression  Pt is a 61 y/o female s/p CVA.  Pt presents with R side weakness, expressive difficulty and difficulty following commands.  Acute PT will follow pt.  Suggesting CIR consult.      PT Assessment  Patient needs continued PT services    Follow Up Recommendations  Inpatient Rehab    Equipment Recommendations  Defer to next venue    Frequency Min 5X/week    Precautions / Restrictions Precautions Precautions: Fall Restrictions Weight Bearing Restrictions: No    Pertinent Vitals/Pain Pt denies pain, HR 90 sitting on EOB, O2 sats greater than 96       Mobility  Bed Mobility Bed Mobility: Supine to Sit;Sitting - Scoot to Edge of Bed Supine to Sit: 4: Min guard;HOB flat;With rails Sitting - Scoot to Edge of Bed: 4: Min assist Sit to Sidelying Left: Not Tested (comment) Details for Bed Mobility Assistance: Pt able to transition from supine to sit without assistance with extra effort to manage R LE.  Pt required min assist to scoot to edge by manually advancing RLE secondary to R LE becoming stuck on bedrail.  Transfers Transfers: Sit to Stand;Stand to Sit;Stand Pivot Transfers Sit to Stand: 1: +2 Total assist;With upper extremity assist;From bed (two trials pt approximately 50%) Sit to Stand: Patient Percentage: 50% Stand to Sit: 4: Min assist;With upper extremity assist;To bed;To chair/3-in-1 Stand Pivot Transfers: 1: +2 Total assist Stand Pivot Transfers: Patient Percentage: 50% Details for Transfer Assistance: Sit>stand- Manual facilitation for trunk extension, cueing for hand placement (L hand on rail of bed Rt hand on walker), Tactile cues for anterior wt shift.  Stand>sit - verbal and tactile cues for Left hand placement on armrest of chair.   Verbal cues for controlled descent to chair.   Stand Pivot  -  manual facilitation to manage walker.  Assist to stabilize pt and maintain balance.    (Two trials from EOB. ) Ambulation/Gait Ambulation/Gait Assistance: Not tested (comment) Stairs: No Wheelchair Mobility Wheelchair Mobility: No Modified Rankin (Stroke Patients Only) Modified Rankin: Moderately severe disability    Exercises     PT Goals Acute Rehab PT Goals PT Goal Formulation: With patient/family Time For Goal Achievement: 01/03/12 Potential to Achieve Goals: Good Pt will go Supine/Side to Sit: Independently;with HOB 0 degrees PT Goal: Supine/Side to Sit - Progress: Goal set today Pt will Sit at Edge of Bed: Independently;3-5 min;with no upper extremity support PT Goal: Sit at Edge Of Bed - Progress: Goal set today Pt will go Sit to Supine/Side: Independently;with HOB 0 degrees PT Goal: Sit to Supine/Side - Progress: Goal set today Pt will go Sit to Stand: with supervision;with upper extremity assist PT Goal: Sit to Stand - Progress: Goal set today Pt will go Stand to Sit: with modified independence PT Goal: Stand to Sit - Progress: Goal set today Pt will Transfer Bed to Chair/Chair to Bed: with supervision PT Transfer Goal: Bed to Chair/Chair to Bed - Progress: Goal set today Pt will Ambulate: 16 - 50 feet;with supervision;with least restrictive assistive device;with rolling walker PT Goal: Ambulate - Progress: Goal set today  Visit Information  Last PT Received On: 12/20/11 Assistance Needed: +2    Subjective Data  Subjective: Pt agreeable to PT/OT eval.     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 Communication Communication: Expressive difficulties Dominant Hand: Right    Cognition  Overall Cognitive Status: Appears within functional limits for tasks assessed/performed Arousal/Alertness: Awake/alert Orientation Level: Appears intact for tasks assessed Behavior During Session: [facility] for tasks performed    Extremity/Trunk Assessment Right Upper Extremity Assessment RUE ROM/Strength/Tone: Deficits RUE ROM/Strength/Tone Deficits: 3+/5 RUE Sensation: WFL - Light Touch;WFL - Proprioception RUE Sensation Deficits: see OT eval RUE Coordination: Deficits RUE Coordination Deficits: Decreased coordination Left Upper Extremity Assessment LUE ROM/Strength/Tone: Deficits LUE ROM/Strength/Tone Deficits: 3+/5, question pt's effort during MMT and decreased ability to follow commands LUE Sensation: WFL - Light Touch;WFL - Proprioception LUE Coordination: WFL - gross motor Right Lower Extremity Assessment RLE ROM/Strength/Tone: Deficits RLE ROM/Strength/Tone Deficits: AROM WFL; knee extension 4/5, knee flexion 4/5 dorsiflexion 4/5  RLE Coordination: WFL - gross motor Left Lower Extremity Assessment LLE ROM/Strength/Tone: Within functional levels LLE Sensation: WFL - Light Touch LLE Coordination: WFL - gross motor Trunk Assessment Trunk Assessment: Normal   Balance Balance Balance Assessed: Yes Static Sitting Balance Static Sitting - Balance Support: No upper extremity supported;Feet unsupported Static Sitting - Level of Assistance: 5: Stand by assistance Static Sitting - Comment/# of Minutes: several minutes on EOB while performing functional activities.  Dynamic  Sitting Balance Dynamic Sitting - Balance Support: Feet supported;No upper extremity supported Dynamic Sitting - Level of Assistance: 4: Min assist Reach (Patient is able to reach ___ inches to right, left, forward, back): > 5  Dynamic Sitting - Balance Activities: Lateral lean/weight shifting;Forward lean/weight shifting;Reaching for objects;Reaching across midline  End of Session PT - End of Session Equipment Utilized During Treatment: Gait belt Activity Tolerance: Patient tolerated treatment well Patient left: in chair;with call bell/phone within reach;with family/visitor present Nurse Communication: Mobility status;Other (comment)   Houa Ackert 12/20/2011, 5:12 PM Oryan Winterton L. Josealfredo Adkins DPT [phone]

## 2011-12-20 NOTE — Progress Notes (Signed)
SLP Cancellation Note  Treatment cancelled today;  Pt politely, and understandably, declined speech therapy until after her surgery on Tues.  Will f/u next week.  Damir Leung L. Samson Frederic, [place] CCC/SLP Pager [phone]   Blenda Mounts Laurice 12/20/2011, 3:10 PM

## 2011-12-20 NOTE — Progress Notes (Signed)
ANTIBIOTIC CONSULT NOTE - FOLLOW UP  Pharmacy Consult for vancomycin and ceftriaxone Indication: r/o endocarditis  No Known Allergies  Patient Measurements: Height: 5\' 3"  (160 cm) Weight: 201 lb 8 oz (91.4 kg) IBW/kg (Calculated) : 52.4   Vital Signs: Temp: 98.2 F (36.8 C) (04/26 1200) Temp src: Oral (04/26 1200) BP: 122/56 mmHg (04/26 1200) Pulse Rate: 90  (04/26 1200) Intake/Output from previous day: 04/25 0701 - 04/26 0700 In: 637.5 [P.O.:300; I.V.:137.5; IV Piggyback:200] Out: 295 [Urine:295] Intake/Output from this shift: Total I/O In: 480 [P.O.:480] Out: -   Labs:  Basename 12/20/11 0540 12/19/11 0500 12/18/11 0700 12/18/11 0303  WBC 6.7 -- -- 8.8  HGB 11.5* -- -- 11.9*  PLT 272 -- -- 242  LABCREA -- -- -- --  CREATININE 0.58 0.65 0.69 --   Estimated Creatinine Clearance: 81.3 ml/min (by C-G formula based on Cr of 0.58).  Basename 12/20/11 1115  VANCOTROUGH 19.7  VANCOPEAK --  VANCORANDOM --  GENTTROUGH --  GENTPEAK --  GENTRANDOM --  TOBRATROUGH --  TOBRAPEAK --  TOBRARND --  AMIKACINPEAK --  AMIKACINTROU --  AMIKACIN --     Microbiology: Recent Results (from the past 720 hour(s))  CULTURE, BLOOD (ROUTINE X 2)     Status: Normal   Collection Time   12/14/11  4:14 PM      Component Value Range Status Comment   Specimen Description BLOOD ARM RIGHT   Final    Special Requests BOTTLES DRAWN AEROBIC AND ANAEROBIC 10CC   Final    Culture  Setup Time [id number]   Final    Culture NO GROWTH 5 DAYS   Final    Report Status 12/20/2011 FINAL   Final   CULTURE, BLOOD (ROUTINE X 2)     Status: Normal   Collection Time   12/14/11  5:45 PM      Component Value Range Status Comment   Specimen Description BLOOD RIGHT ARM   Final    Special Requests BOTTLES DRAWN AEROBIC AND ANAEROBIC 10CC   Final    Culture  Setup Time [id number]   Final    Culture     Final    Value: STAPHYLOCOCCUS SPECIES (COAGULASE NEGATIVE)     Note: THE SIGNIFICANCE OF ISOLATING  THIS ORGANISM FROM A SINGLE SET OF BLOOD CULTURES WHEN MULTIPLE SETS ARE DRAWN IS UNCERTAIN. PLEASE NOTIFY THE MICROBIOLOGY DEPARTMENT WITHIN ONE WEEK IF SPECIATION AND SENSITIVITIES ARE REQUIRED.     Note: Gram Stain Report Called to,Read Back By and Verified With: ELAINE THIELEN @1057  12/17/11 BY KRAWS   Report Status 12/19/2011 FINAL   Final   MRSA PCR SCREENING     Status: Normal   Collection Time   12/14/11  5:52 PM      Component Value Range Status Comment   MRSA by PCR NEGATIVE  NEGATIVE  Final   CULTURE, BLOOD (ROUTINE X 2)     Status: Normal (Preliminary result)   Collection Time   12/17/11 12:50 PM      Component Value Range Status Comment   Specimen Description BLOOD LEFT FOREARM   Final    Special Requests     Final    Value: BOTTLES DRAWN AEROBIC AND ANAEROBIC 4CC RED  6CC BLUE   Culture  Setup Time [id number]   Final    Culture     Final    Value:        BLOOD CULTURE RECEIVED NO GROWTH TO DATE CULTURE WILL BE HELD FOR 5  DAYS BEFORE ISSUING A FINAL NEGATIVE REPORT   Report Status PENDING   Incomplete   CULTURE, BLOOD (ROUTINE X 2)     Status: Normal (Preliminary result)   Collection Time   12/17/11  1:00 PM      Component Value Range Status Comment   Specimen Description BLOOD RIGHT ARM   Final    Special Requests BOTTLES DRAWN AEROBIC ONLY 1.5CC   Final    Culture  Setup Time [id number]   Final    Culture     Final    Value:        BLOOD CULTURE RECEIVED NO GROWTH TO DATE CULTURE WILL BE HELD FOR 5 DAYS BEFORE ISSUING A FINAL NEGATIVE REPORT   Report Status PENDING   Incomplete     Anti-infectives     Start     Dose/Rate Route Frequency Ordered Stop   12/17/11 1800   cefTRIAXone (ROCEPHIN) 2 g in dextrose 5 % 50 mL IVPB        2 g 100 mL/hr over 30 Minutes Intravenous Every 24 hours 12/16/11 1827     12/17/11 0400   vancomycin (VANCOCIN) IVPB 1000 mg/200 mL premix        1,000 mg 200 mL/hr over 60 Minutes Intravenous Every 8 hours 12/16/11 1827     12/16/11  1900   cefTRIAXone (ROCEPHIN) 2 g in dextrose 5 % 50 mL IVPB        2 g 100 mL/hr over 30 Minutes Intravenous NOW 12/16/11 1827 12/16/11 2058   12/16/11 1900   vancomycin (VANCOCIN) IVPB 1000 mg/200 mL premix        1,000 mg 200 mL/hr over 60 Minutes Intravenous NOW 12/16/11 1827 12/16/11 2139          Assessment: 26 YOF admitted with an acute L CVA and ECHO performed for stroke evaluation revealed a left atrial mass attached to intra-atrial septum (highly suggestive of myxoma) and the patient is on Vancomycin + Rocephin for empiric endocarditis coverage. 1 of 2 blood cx 4/20 + for coag neg staph. 4/23 blood cx negative so far.  S/p cath with normal coronary arteries but atrial myxoma present, plan for TCTS to remove.  Vanc trough this am = 19.7. Trough within 15-20 mcg/ml range.   4/20: BC = CoNS 1/2 4/23 BC = NGTD  Vancomycin 4/22 >> Ceftriaxone 4/22 >>  Goal of Therapy:  Vancomycin trough level 15-20 mcg/ml  Plan:  1. Continue vancomycin 1gm IV q8h as renal fx is stable and trough is therapeutic.  2. Continue Ceftriaxone 2gm IV q24h 3. F/u length of abx therapy, may be stopped in light if 2nd set of negative cultures. Herby Abraham, Pharm.D. [phone] 12/20/2011 1:16 PM

## 2011-12-20 NOTE — Progress Notes (Addendum)
Name: Courtney Faulkner MRN: [id number] DOB: 1951/02/04    LOS: 6  PCCM progressive NOTE   History of Present Illness: History of Present Illness:  61 yo AAF , sociology teacher at [employer], non smoker/drinker/no PMH , who presented 4/20 with garbled speech, hypoglycemia. MRI revealed moderate lt hemispheric infarct. 4/21 she again had hypogylcemia and change in speech pattern. She was moved to NSICU and PCCM asked to assume her care. She is being followed by stroke team. 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 COAG NEG STAPH 4/23 BC x 2>>>  Antibiotics: vanc 4/22>>>  Ceftriaxone 4/22>>>  Tests / Events: -Blood culture is positive for  Gram positive cocci in clusters.  -TEE: 1.7 x 1.7 cm mass attached to the left atrial septum most likely secondary to left atrial myxoma. normal LV function and normal wall motion. -Lab: negative ANA, RF, C3, CA19-9 and sickel cell screen; elevated Crp 4.85  and ESR46.  -CT-abd/Pelvis w/o CM showed  no definitive pancreatic masses identified  -CT-chest:  scarring/interstitial fibrosis in lung. Right upper lobe focal, bronchiectasis. Scattered regions of mild consolidation may represent scarring although difficult to exclude superimposed infection. Cardiomegaly. Ectatic Ascending thoracic aorta measures up to 3.8 cm. 4/25: cardiac cath showed no coronary arterial disease.  Vital Signs: Temp:  [98.6 F (37 C)-99.7 F (37.6 C)] 98.6 F (37 C) (04/26 0400) Pulse Rate:  [71-101] 83  (04/26 0400) Resp:  [18-30] 18  (04/26 0400) BP: (78-117)/(48-86) 105/53 mmHg (04/26 0400) SpO2:  [93 %-100 %] 95 % (04/26 0400) I/O last 3 completed shifts: In: 2764.5 [P.O.:300; I.V.:1314.5; IV Piggyback:1150] Out: 2620 [Urine:2620]  Physical Examination:  General: WNWD NAD, flat affect  Neuro: alert and oriented X 3, follows commands , improved strength.slight weakness on the right side.  Still has slurry speech. HEENT: No jvd  Cardiovascular: s1 s2 rrr no r  Lungs: cta  Abdomen: +bs obese, NT ND  Musculoskeletal: intact  Skin: warm  Ventilator settings: not on ventilator   Labs and Imaging:  Reviewed.  Please refer to the Assessment and Plan section for relevant results.  ASSESSMENT AND PLAN  NEUROLOGIC A:  New lt stroke, muscle strength improved. TEE did not show vegetation. Positive blood culture P: -per stroke team, improved.  -continue ASA -heparin per neuro -PT/OT  Left atrial Myxoma:  A: TEE showed 1.7 x 1.7 cm mass attached to the left atrial septum most likely secondary to left atrial myxoma. P:  - Appreciate CVTS guidance  -possible surgery on Tue 4/30 -keep tele  PULMONARY A:  [place] currently. Lung is clear to ausculation. O2 Sat is good. CT- chest 4/24 showed scarring/interstitial fibrosis in lung. Right upper lobe focal, bronchiectasis. Scattered regions of mild consolidation may represent scarring although difficult to exclude superimposed infection.  P: - observe. Mobilize - NEEDS REPEAT CT CHEST END MAY/EARLY MAY 2013 with or without OPD PULMONARY FOLLOWUP  CARDIOVASCULAR  Lab 12/16/11 0311 12/15/11 2005 12/15/11 1252 12/15/11 0900 12/14/11 2227 12/14/11 1456  TROPONINI 0.98* 0.65* 1.41* 1.48* 1.34* --  LATICACIDVEN -- -- -- -- -- --  PROBNP -- -- -- 441.6* -- 188.5*   A:  No chest pain, but with elevated troponin. Cath reviewed P: -appreciate cardiolog's help in managing our patient -continue ASA cath reviewed, will alter CVTS approach as no CAD Keep tele  RENAL  Lab 12/19/11 0500 12/18/11 0700 12/18/11 0303 12/17/11 2248 12/17/11 1906 12/14/11  1456  NA 137 135 137 136 135 --  K 3.7 3.2* -- -- -- --  CL 100 97 99 99 100 --  CO2 26 28 28 25 26  --  BUN 10 9 9 10 6  --  CREATININE 0.65 0.69 0.70 0.78 0.69 --  CALCIUM 9.3 9.1 8.9 9.0 9.0 --  MG 1.9 -- -- -- -- 1.9  PHOS 3.9 -- -- -- -- --   A:  No issue.  P: -pending  BMP - d5 ns for hypoglcyemia   GASTROINTESTINAL  Lab 12/18/11 0303 12/17/11 0300 12/15/11 0900 12/14/11 1236  AST 25 24 33 30  ALT 21 19 25 27   ALKPHOS 54 50 54 52  BILITOT 0.4 0.3 0.4 0.3  PROT 6.1 5.6* 6.3 5.9*  ALBUMIN 2.5* 2.3* 2.7* 2.7*   A:  No significant issue. Slightly decreased albumin. P: -->lft in future Tolerating heart healthy diet  HEMATOLOGIC  Lab 12/18/11 0303 12/17/11 1906 12/17/11 0300 12/16/11 0311 12/15/11 0900 12/14/11 1742  HGB 11.9* -- 11.4* 11.2* 12.2 11.3*  HCT 36.8 -- 35.3* 34.7* 38.0 35.7*  PLT 242 -- 240 261 306 268  INR -- 1.04 -- -- -- --  APTT -- -- -- -- -- --   A:  Anemia unclear etiology. Normal Vb12 and ferritin. Likely due to chronic disease. Stable Hgb.  P: -->  Levenox, likely beneficial in setting tumor  INFECTIOUS  Lab 12/19/11 0500 12/18/11 0303 12/17/11 0300 12/16/11 0311 12/15/11 0900 12/14/11 1742  WBC -- 8.8 9.2 8.9 7.6 10.5  PROCALCITON <0.10 -- -- -- -- --   A:  positive blood culture for gram positive cocci in cluster, likely staph. Unclear source, possibly from infected myxoma. TEE no vegetation. Repeated Blood culture on 4/23 negative so far. P: -->  Continue vancomycin and ceftriaxone --> BC clusters may be contaminant, but had persistent hypoglcyemia,  -->pending CBC, if repeat BC in AM neg, would dc vanc -PCT neg , consider algorithm to dc ceftriaxone as well over next 3 days - Primry service to decide on abx  ENDOCRINE  Lab 12/20/11 0341 12/20/11 0010 12/19/11 2202 12/19/11 2040 12/19/11 1919  GLUCAP 134* 110* 134* 114* 128*   A:  Hypoglycemia. Unclear etiology.  Differential diagnosis includes insulinoma, big IGF-II syndrome, adrenal insufficiency, overdose of DM medications ( on none). Elevated C-peptide. Pending proinsulin and insulin and sulfonylurea hypoglycemia panel.  Cortisol level normal. CT-abd/Pelvis w/o CM showed  no definitive pancreatic masses identified but could not give contrast then. CBG has  been imrpoved  P: - consider MRI-abd to r/o neuroendocrine tumor in future -continue IV dextrose to d5 NS  --CBG checks / SSI q2h still then to q4h --Low threshold to add empiric hydrocortisone until cortisol back See ID to discuss management possible veg on myxoma  BEST PRACTICE / DISPOSITION  Best practices / Disposition:  --> Tele status under PCCM and teaching service -->full code  -->lovenox for DVT Px  -->Protonix not indicated  -->diet  -Update pt and family   Lorretta Harp, MD PGY1, Internal Medicine Teaching Service Pager: [phone]  STAFF NOTE: I, Dr Lavinia Sharps have personally reviewed patient's available data, including medical history, events of note, physical examination and test results as part of my evaluation. I have discussed with residen  and other care providers such as  RN   In addition,  I personally evaluated patient and elicited key findings of  Stroke from atrial myxoma. Convalescign. Still somewhat confused. STroke, Teaching service, cards and cvts  involved. No PCCM issues. Left message to husband to call back for update. Teaching service will also update him - d/w primary team resident DR Tonny Branch. Will sign off.   Rest per medical resident whose note is outlined above and that I agree with    Dr. Kalman Shan, M.D., [facility].C.P Pulmonary and Critical Care Medicine Staff Physician [facility] [place] [facility] Pager: [phone], If no answer or between  15:00h - 7:00h: call [phone]  12/20/2011 10:21 AM     Addendum 3:35 PM at 12/20/2011: husband finally called back and he was updated

## 2011-12-20 NOTE — Progress Notes (Signed)
Stroke Team Progress Note  HISTORY Courtney Faulkner is an 61 y.o. female with dysarthria, aphasia and right sided weakness. According to son at bedside, his father heard her vaccuming at ~08:30 on 12/14/2011. He came down stairs at 11:30 and found her confused and dysarthric. EMS called and found BS 36. Patient was subsequently brought to the ED. She arrive at 1214. In the ED patient was given some orange juice and her blood CBG went to 166. Patient subsequently had a head CT done which was negative. Initially a code stroke was called however it was canceled secondary to patient's hypoglycemic spells. Per family patient was recently been treated for upper respiratory symptoms with prednisone and albuterol. Patient and family deny any recent fevers, no chills, no chest pain, no diarrhea, no constipation, no dysuria, no generalized weakness. Patient's family does endorse some shortness of breath on exertion when patient climbs up stairs which has been ongoing for the past 2 weeks. On exam and during the interview patient has some dysarthric speech is alert to self only and has an incoherent speech as well. After treatment, CBG 166, pt remains confused, and having difficulty following commands.  Neuro consult called by Dr. Janee Morn for this reason. Patient was not a TPA candidate secondary to presenting beyond the tPA window. She was transferred to the ICU after admission for hypoglycemia and neuro decline for further evaluation and treatment.  SUBJECTIVE Stable no changes. Speech improving but not back to baseline.I spoke to  Dr Tyrone Sage who plans open heart surgery on 12/24/11 for tumor excision.  OBJECTIVE Most recent Vital Signs: Filed Vitals:   12/19/11 2000 12/20/11 0000 12/20/11 0400 12/20/11 0800  BP: 101/52 117/86 105/53 105/53  Pulse: 85 94 83 96  Temp: 98.8 F (37.1 C) 98.8 F (37.1 C) 98.6 F (37 C) 98.1 F (36.7 C)  TempSrc: Oral Oral Oral Oral  Resp: 18 18 18 18   Height:      Weight:       SpO2: 95% 97% 95% 95%   CBG (last 3)   Basename 12/20/11 0732 12/20/11 0657 12/20/11 0341  GLUCAP 118* 116* 134*   Intake/Output from previous day: 04/25 0701 - 04/26 0700 In: 637.5 [P.O.:300; I.V.:137.5; IV Piggyback:200] Out: 295 [Urine:295]  IV Fluid Intake:      . dextrose 5 % and 0.9% NaCl 30 mL/hr at 12/19/11 1025   MEDICATIONS     . antiseptic oral rinse  15 mL Mouth Rinse BID  . aspirin EC  325 mg Oral Daily  . aspirin  300 mg Rectal Daily   Or  . aspirin  325 mg Oral Daily  . cefTRIAXone (ROCEPHIN)  IV  2 g Intravenous Q24H  . enoxaparin  40 mg Subcutaneous Q24H  . hydrocortisone sodium succinate  50 mg Intravenous Q6H  . vancomycin  1,000 mg Intravenous Q8H   PRN:  acetaminophen, acetaminophen, dextrose, ondansetron (ZOFRAN) IV, ondansetron (ZOFRAN) IV  Diet:  General thin liquids Activity:  Bedrest, Bathroom privileges DVT Prophylaxis:  Lovenox 40 mg sq daily   CLINICALLY SIGNIFICANT STUDIES CBC    Component Value Date/Time   WBC 6.7 12/20/2011 0540   RBC 3.69* 12/20/2011 0540   HGB 11.5* 12/20/2011 0540   HCT 34.3* 12/20/2011 0540   PLT 272 12/20/2011 0540   MCV 93.0 12/20/2011 0540   MCH 31.2 12/20/2011 0540   MCHC 33.5 12/20/2011 0540   RDW 13.7 12/20/2011 0540   LYMPHSABS 1.9 12/18/2011 0303   MONOABS 0.9 12/18/2011 0303  EOSABS 0.3 12/18/2011 0303   BASOSABS 0.1 12/18/2011 0303   CMP    Component Value Date/Time   NA 134* 12/20/2011 0540   K 3.4* 12/20/2011 0540   CL 100 12/20/2011 0540   CO2 27 12/20/2011 0540   GLUCOSE 125* 12/20/2011 0540   BUN 13 12/20/2011 0540   CREATININE 0.58 12/20/2011 0540   CALCIUM 9.4 12/20/2011 0540   PROT 6.1 12/18/2011 0303   ALBUMIN 2.5* 12/18/2011 0303   AST 25 12/18/2011 0303   ALT 21 12/18/2011 0303   ALKPHOS 54 12/18/2011 0303   BILITOT 0.4 12/18/2011 0303   GFRNONAA >90 12/20/2011 0540   GFRAA >90 12/20/2011 0540   COAGS Lab Results  Component Value Date   INR 1.04 12/17/2011   Lipid Panel    Component Value  Date/Time   CHOL 134 12/16/2011 0311   TRIG 90 12/16/2011 0311   HDL 45 12/16/2011 0311   CHOLHDL 3.0 12/16/2011 0311   VLDL 18 12/16/2011 0311   LDLCALC 71 12/16/2011 0311   HgbA1C  Lab Results  Component Value Date   HGBA1C 5.1 12/16/2011   Cardiac Panel (last 3 results)  No results found for this basename: CKTOTAL:3,CKMB:3,TROPONINI:3,RELINDX:3 in the last 72 hours Urinalysis    Component Value Date/Time   COLORURINE YELLOW 12/14/2011 1255   APPEARANCEUR CLEAR 12/14/2011 1255   LABSPEC 1.015 12/14/2011 1255   PHURINE 7.5 12/14/2011 1255   GLUCOSEU 250* 12/14/2011 1255   HGBUR NEGATIVE 12/14/2011 1255   BILIRUBINUR NEGATIVE 12/14/2011 1255   KETONESUR NEGATIVE 12/14/2011 1255   PROTEINUR NEGATIVE 12/14/2011 1255   UROBILINOGEN 0.2 12/14/2011 1255   NITRITE NEGATIVE 12/14/2011 1255   LEUKOCYTESUR NEGATIVE 12/14/2011 1255   Urine Drug Screen  No results found for this basename: labopia,  cocainscrnur,  labbenz,  amphetmu,  thcu,  labbarb    Alcohol Level No results found for this basename: eth   VITAMIN B12     Status: Normal   Collection Time   12/15/11  9:00 AM      Component Value Range   Vitamin B-12 287  211 - 911 (pg/mL)  IRON AND TIBC     Status: Abnormal   Collection Time   12/15/11  9:00 AM      Component Value Range   Iron 39 (*) 42 - 135 (ug/dL)   TIBC 161 (*) 096 - 045 (ug/dL)   Saturation Ratios 17 (*) 20 - 55 (%)   UIBC 192  125 - 400 (ug/dL)  FERRITIN     Status: Normal   Collection Time   12/15/11  9:00 AM      Component Value Range   Ferritin 200  10 - 291 (ng/mL)  RETICULOCYTES     Status: Normal   Collection Time   12/15/11  9:00 AM      Component Value Range   Retic Ct Pct 2.9  0.4 - 3.1 (%)   RBC. 3.97  3.87 - 5.11 (MIL/uL)   Retic Count, Manual 115.1  19.0 - 186.0 (K/uL)  PRO B NATRIURETIC PEPTIDE     Status: Abnormal   Collection Time   12/15/11  9:00 AM      Component Value Range   Pro B Natriuretic peptide (BNP) 441.6 (*) 0 - 125 (pg/mL)   CT of  the brain   12/15/2011  Developing cytotoxic edema correlates with the observed pattern of restricted diffusion on MR, representing acute infarction. This primarily involves the left basal ganglia.  Smaller areas of  cortical and subcortical infarction seen on MRI in the left hemisphere are too small to be easily visible on CT.   No incipient uncal or transtentorial herniation, although minimal 1 mm left to right shift may be present.  No hemorrhagic transformation.    12/14/2011  Normal CT of the head without contrast.   CT Angio Head 12/16/2011  Acute non hemorrhagic infarct left basal ganglia, left uncus and left corona radiata.  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  Collateral flow to the left anterior cerebral artery and left middle cerebral artery branches.  The flow within the left middle cerebral artery branches is diminished suggesting that collateral flow is not adequate.   CT Angio Neck 12/16/2011  Abnormal appearance of the left internal carotid artery with appearance of dissection with complete occlusion at the C2 level.  Abnormal appearance of the proximal left common carotid artery. Question if this is related to artifact versus dissection versus atherosclerotic type changes.  Mild narrowing proximal right common carotid artery.  Prominent ectasia of the proximal right common carotid artery with fold and mild narrowing.  Mild irregularity at the right carotid bifurcation without measurable stenosis.  Mild to moderate narrowing proximal right vertebral artery.  Mild irregularity throughout the course of the right vertebral artery.  Left vertebral artery arises directly from the aortic arch. Mild narrowing proximal left vertebral artery.  Evaluation of portions of the proximal left vertebral artery is limited by artifact.  Nonspecific parenchymal changes lung apices greater on the right. Findings can be evaluated with a formal chest CT.   MRI of the brain  12/14/2011  Moderate sized acute left  hemisphere infarct which appears to be secondary to an acute left internal carotid artery occlusion. No visible hemorrhage or midline shift.  Emboli to the left middle cerebral artery and its branches is not excluded.  MRA of the brain  See CT angio  2D Echocardiogram  EF 60-65% with no source of embolus.  Left atrial mass attached to intra-atrial septum highly suggestive of myxoma.  Carotid Doppler  Right: no ICA stenosis. Left: ICA appears occluded. Bilateral vertebral artery flow is antegrade.  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  CXR  12/18/2011 Some increase in airspace disease likely due to progressive edema. 12/16/2011  Appearance of chest is similar to the recent prior study, again suggesting a background of mild interstitial pulmonary edema. Overall, aeration has slightly improved, with decreasing small bilateral pleural effusions. 12/14/2011   Cardiomegaly and bilateral airspace disease most consistent with pulmonary edema.     CT chest 12/17/2011 Respiratory motion degraded examination with poor inspiration. Scattered pulmonary parenchymal changes some which appears to represent scarring/interstitial fibrosis. Right upper lobe focal bronchiectasis. Scattered regions of mild consolidation may represent scarring although difficult to exclude superimposed infection  CT Abdomen and Pelvis 12/17/2011 On this unenhanced examination, no definitive pancreatic masses identified however, evaluation without contrast is markedly limited. For detection of a neuroendocrine tumor, preferable examination is contrast enhanced MR. If the patient cannot hold her breath, CT with contrast with pancreatic protocol can be obtained. Very small amount of free fluid. Etiology indeterminate.  Cardiac Cath 12/18/2011  1. Normal coronary arteries. 2. Normal abdominal aorta, iliac and femoral arteries. 3. Left atrial myxoma identified by neovascularization  during right coronary angiography. 4. Left ventriculography was not performed.  EKG  normal sinus rhythm.   Physical Exam   Pleasant middle-age [place] American  lady currently not in distress.Awake alert. Afebrile. Head is nontraumatic. Neck is supple without bruit. Hearing is normal. Cardiac exam no murmur or gallop. Lungs are clear to auscultation. Distal pulses are well felt.  Neurological Exam ; awake alert mild expressive aphasia with word finding difficulties and paraphasias. Good comprehension  and repitition but poor naming.. Able to follow commands well. Extraocular movements are full range without nystagmus. No visual field defect to bedside confrontational testing. Mild right lower facial weakness. Tongue is midline. Motor system exam reveals mild right hemiparesis with RUE 3/5 weaknessand moderate right grip and intrinsic hand muscles. Good right lower extremity strength without drift. The minimum right hip flexion and ankle dorsiflexion weakness. Sensation slightly diminished on the right compared to the left. Coordination is slow but accurate. Gait was not tested.  ASSESSMENT Ms. Courtney Faulkner is a 61 y.o. 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 Neuro improvement since admission. On no antiplatelets prior to admission. Now on aspirin 325 mg orally every day for secondary stroke prevention. Patient with resultant right hemiparesis and expressive aphasia with improvement. Ok for IV heparin from neuro standpoint. Patient is at risk for additional neuro events.  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 Cath clear for OR. -No sign of endocarditis on TEE, on Vanc and rocephin started 12/16/2011 -left ICA occlusion suspect secondary to myxoma emboli vs dissection -tachycardia -hypoglycemia, recurrent, ? Etiology, Insulinoma not confirmed on CT. Workup underway -Nonspecific  parenchymal changes lung apices greater on the right. With progressive pulmonary edema today. -elevated cardiac enzymes, cardiac related.  -short PR -obesity, class 2, Body mass index is 35.69 kg/(m^2).   Hospital day # 6  TREATMENT/PLAN -surgery to remove myxoma. Agree with plans for next Tues -ok for IV heparin from neuro standpoint for OR next Tues. D/w Dr Tyson Alias & Tyrone Sage -ok to mobilize out of bed and transfer to telemetry floor bed.   Delia Heady, MD  12/20/2011 9:54 AM  ,

## 2011-12-20 NOTE — Progress Notes (Signed)
   CARDIOTHORACIC SURGERY PROGRESS NOTE  2 Days Post-Op  S/P Procedure(s) (LRB): CORONARY ANGIOGRAM () ABDOMINAL ANGIOGRAM ()  Subjective: Feels pretty well. Still with some trouble finding words.  Stood up and took a few steps today.  Eating okay.  Objective: Vital signs in last 24 hours: Temp:  [98.1 F (36.7 C)-98.8 F (37.1 C)] 98.2 F (36.8 C) (04/26 1200) Pulse Rate:  [83-96] 90  (04/26 1200) Cardiac Rhythm:  [-] Normal sinus rhythm (04/26 0800) Resp:  [17-18] 17  (04/26 1200) BP: (101-122)/(52-86) 122/56 mmHg (04/26 1200) SpO2:  [95 %-97 %] 95 % (04/26 1200)  Physical Exam:  Rhythm:   sinus  Breath sounds: clear  Heart sounds:  RRR  Incisions:  n/a  Abdomen:  soft  Extremities:  warm   Intake/Output from previous day: 04/25 0701 - 04/26 0700 In: 637.5 [P.O.:300; I.V.:137.5; IV Piggyback:200] Out: 295 [Urine:295] Intake/Output this shift: Total I/O In: 480 [P.O.:480] Out: -   Lab Results:  Basename 12/20/11 0540 12/18/11 0303  WBC 6.7 8.8  HGB 11.5* 11.9*  HCT 34.3* 36.8  PLT 272 242   BMET:  Basename 12/20/11 0540 12/19/11 0500  NA 134* 137  K 3.4* 3.7  CL 100 100  CO2 27 26  GLUCOSE 125* 95  BUN 13 10  CREATININE 0.58 0.65  CALCIUM 9.4 9.3    CBG (last 3)   Basename 12/20/11 1255 12/20/11 0732 12/20/11 0657  GLUCAP 162* 118* 116*   PT/INR:   Basename 12/17/11 1906  LABPROT 13.8  INR 1.04    CXR:  *RADIOLOGY REPORT*  Clinical Data: Right side weakness. Stroke.  PORTABLE CHEST - 1 VIEW 12/18/2011 Comparison: Plain film chest 12/14/2011 and 12/17/2011.  Findings: Patchy bilateral airspace disease shows some interval  increase on the left. Heart size is upper normal.  IMPRESSION:  Some increase in airspace disease likely due to progressive edema.  Original Report Authenticated By: Bernadene Bell. Maricela Curet, M.D.   Assessment/Plan: S/P Procedure(s) (LRB): CORONARY ANGIOGRAM () ABDOMINAL ANGIOGRAM ()  I have reviewed Mrs Knouff's  history, exam, TEE, cath, CTA's.  I discussed the indications, risks and benefits with Mrs Galvis and her son.  Alternative surgical approaches discussed.  The timing of surgery and potential risks related to recent stroke and ICA occlusion discussed.  All questions answered.  Will empirically start Amiodarone preoperatively due to the high associated risk of post op Afib/Aflutter.  We tentatively plan minimally invasive excision of LA myxoma on Tuesday.    Alphonsus Doyel H 12/20/2011 4:48 PM

## 2011-12-20 NOTE — Progress Notes (Signed)
Subjective: No acute events overnight.  The patient continues to have expressive aphasia.  She notes no pain or discomfort.  Neuro exam unchanged from yesterday.  Objective: Vital signs in last 24 hours: Filed Vitals:   12/19/11 2000 12/20/11 0000 12/20/11 0400 12/20/11 0800  BP: 101/52 117/86 105/53 105/53  Pulse: 85 94 83 96  Temp: 98.8 F (37.1 C) 98.8 F (37.1 C) 98.6 F (37 C) 98.1 F (36.7 C)  TempSrc: Oral Oral Oral Oral  Resp: 18 18 18 18   Height:      Weight:      SpO2: 95% 97% 95% 95%   Weight change:   Intake/Output Summary (Last 24 hours) at 12/20/11 1143 Last data filed at 12/19/11 1300  Gross per 24 hour  Intake    209 ml  Output      0 ml  Net    209 ml   Physical Exam: General: alert, cooperative, and in no apparent distress HEENT: pupils equal round and reactive to light, vision grossly intact, oropharynx clear and non-erythematous, right lower facial droop noted. Neck: supple, no lymphadenopathy Lungs: clear to ascultation bilaterally, normal work of respiration, no wheezes, rales, ronchi Heart: regular rate and rhythm, no murmurs, gallops, or rubs Abdomen: soft, non-tender, non-distended, normal bowel sounds Extremities: no cyanosis, clubbing, or edema Neurologic: alert & oriented X3, right lower facial droop noted, otherwise CN II-VI and VIII-XII intact, strength 4/5 in right shoulder flexion and adduction, 4-5/5 in right elbow flexion/extension, otherwise 5/5 throughout.  Lab Results: Basic Metabolic Panel:  Lab 12/20/11 1610 12/19/11 0500 12/14/11 1456  NA 134* 137 --  K 3.4* 3.7 --  CL 100 100 --  CO2 27 26 --  GLUCOSE 125* 95 --  BUN 13 10 --  CREATININE 0.58 0.65 --  CALCIUM 9.4 9.3 --  MG -- 1.9 1.9  PHOS -- 3.9 --   Liver Function Tests:  Lab 12/18/11 0303 12/17/11 0300  AST 25 24  ALT 21 19  ALKPHOS 54 50  BILITOT 0.4 0.3  PROT 6.1 5.6*  ALBUMIN 2.5* 2.3*   CBC:  Lab 12/20/11 0540 12/18/11 0303 12/17/11 0300  WBC 6.7 8.8  --  NEUTROABS -- 5.7 5.7  HGB 11.5* 11.9* --  HCT 34.3* 36.8 --  MCV 93.0 94.6 --  PLT 272 242 --   Cardiac Enzymes:  Lab 12/16/11 0311 12/15/11 2005 12/15/11 1252  CKTOTAL 349* 417* 407*  CKMB 7.9* 10.6* 11.0*  CKMBINDEX -- -- --  TROPONINI 0.98* 0.65* 1.41*   BNP:  Lab 12/15/11 0900 12/14/11 1456  PROBNP 441.6* 188.5*   CBG:  Lab 12/20/11 0732 12/20/11 0657 12/20/11 0341 12/20/11 0010 12/19/11 2202 12/19/11 2040  GLUCAP 118* 116* 134* 110* 134* 114*   Hemoglobin A1C:  Lab 12/16/11 0311  HGBA1C 5.1   Fasting Lipid Panel:  Lab 12/16/11 0311  CHOL 134  HDL 45  LDLCALC 71  TRIG 90  CHOLHDL 3.0  LDLDIRECT --   Thyroid Function Tests:  Lab 12/16/11 1103  TSH 3.588  T4TOTAL --  FREET4 --  T3FREE --  THYROIDAB --   Coagulation:  Lab 12/17/11 1906  LABPROT 13.8  INR 1.04   Anemia Panel:  Lab 12/15/11 0900  VITAMINB12 287  FOLATE 5.8  FERRITIN 200  TIBC 231*  IRON 39*  RETICCTPCT 2.9   Urinalysis:  Lab 12/14/11 1255  COLORURINE YELLOW  LABSPEC 1.015  PHURINE 7.5  GLUCOSEU 250*  HGBUR NEGATIVE  BILIRUBINUR NEGATIVE  KETONESUR NEGATIVE  PROTEINUR  NEGATIVE  UROBILINOGEN 0.2  NITRITE NEGATIVE  LEUKOCYTESUR NEGATIVE    Micro Results: Recent Results (from the past 240 hour(s))  CULTURE, BLOOD (ROUTINE X 2)     Status: Normal   Collection Time   12/14/11  4:14 PM      Component Value Range Status Comment   Specimen Description BLOOD ARM RIGHT   Final    Special Requests BOTTLES DRAWN AEROBIC AND ANAEROBIC 10CC   Final    Culture  Setup Time [id number]   Final    Culture NO GROWTH 5 DAYS   Final    Report Status 12/20/2011 FINAL   Final   CULTURE, BLOOD (ROUTINE X 2)     Status: Normal   Collection Time   12/14/11  5:45 PM      Component Value Range Status Comment   Specimen Description BLOOD RIGHT ARM   Final    Special Requests BOTTLES DRAWN AEROBIC AND ANAEROBIC 10CC   Final    Culture  Setup Time [id number]   Final    Culture      Final    Value: STAPHYLOCOCCUS SPECIES (COAGULASE NEGATIVE)     Note: THE SIGNIFICANCE OF ISOLATING THIS ORGANISM FROM A SINGLE SET OF BLOOD CULTURES WHEN MULTIPLE SETS ARE DRAWN IS UNCERTAIN. PLEASE NOTIFY THE MICROBIOLOGY DEPARTMENT WITHIN ONE WEEK IF SPECIATION AND SENSITIVITIES ARE REQUIRED.     Note: Gram Stain Report Called to,Read Back By and Verified With: ELAINE THIELEN @1057  12/17/11 BY KRAWS   Report Status 12/19/2011 FINAL   Final   MRSA PCR SCREENING     Status: Normal   Collection Time   12/14/11  5:52 PM      Component Value Range Status Comment   MRSA by PCR NEGATIVE  NEGATIVE  Final   CULTURE, BLOOD (ROUTINE X 2)     Status: Normal (Preliminary result)   Collection Time   12/17/11 12:50 PM      Component Value Range Status Comment   Specimen Description BLOOD LEFT FOREARM   Final    Special Requests     Final    Value: BOTTLES DRAWN AEROBIC AND ANAEROBIC 4CC RED  6CC BLUE   Culture  Setup Time [id number]   Final    Culture     Final    Value:        BLOOD CULTURE RECEIVED NO GROWTH TO DATE CULTURE WILL BE HELD FOR 5 DAYS BEFORE ISSUING A FINAL NEGATIVE REPORT   Report Status PENDING   Incomplete   CULTURE, BLOOD (ROUTINE X 2)     Status: Normal (Preliminary result)   Collection Time   12/17/11  1:00 PM      Component Value Range Status Comment   Specimen Description BLOOD RIGHT ARM   Final    Special Requests BOTTLES DRAWN AEROBIC ONLY 1.5CC   Final    Culture  Setup Time [id number]   Final    Culture     Final    Value:        BLOOD CULTURE RECEIVED NO GROWTH TO DATE CULTURE WILL BE HELD FOR 5 DAYS BEFORE ISSUING A FINAL NEGATIVE REPORT   Report Status PENDING   Incomplete    Studies/Results: No results found. Medications: I have reviewed the patient's current medications. Scheduled Meds:   . antiseptic oral rinse  15 mL Mouth Rinse BID  . aspirin EC  325 mg Oral Daily  . aspirin  300 mg Rectal Daily   Or  .  aspirin  325 mg Oral Daily  . cefTRIAXone  (ROCEPHIN)  IV  2 g Intravenous Q24H  . enoxaparin  40 mg Subcutaneous Q24H  . hydrocortisone sodium succinate  50 mg Intravenous Q6H  . potassium chloride  40 mEq Oral Once  . vancomycin  1,000 mg Intravenous Q8H   Continuous Infusions:   . dextrose 5 % and 0.9% NaCl 30 mL/hr at 12/19/11 1025   PRN Meds:.acetaminophen, acetaminophen, dextrose, ondansetron (ZOFRAN) IV, ondansetron (ZOFRAN) IV  Assessment/Plan: The patient is a 61 yo woman, no prior significant medical history, presenting with weakness and dysphagia, found to have an acute CVA, likely secondary to atrial myxoma.  # Left atrial myxoma - plan for CVTS surgery next week, likely Tuesday -appreciate CVTS, cardiology recs -plan for surgical removal tentatively Tuesday, possible R chest approach rather than sternotomy, awaiting eval by Dr. Cornelius Moras -continue aspirin 81 mg daily (does not need to be discontinued in anticipation of CVTS surg next week, per Dr. Tyrone Sage) -consider heparin drip - cleared from neuro, CVTS, and critical care standpoint  # Recurrent hypoglycemia - CBG's currently well-controlled on D5-NS, though hypoglycemia noted as recently as 4/24.  Elevated C-peptide, proinsulin/insulin ratio pending.  Differential includes insulinoma vs infectious etiology (with myxomas as possible source). -await proinsulin/insulin results -consider MRI abd to further eval for pancreatic mass  # Acute CVA - likely represents thrombus from atrial myxoma, also considering L ICA dissection (given CT neck results). -continue aspirin daily, decrease to 81 mg per CVTS recs -consider heparin drip to prevent further thrombi while awaiting CVTS surgery -continue PT, OT, SLP, to improve patient's RUE weakness, and R CN VII function  # ?Bacteremia - 1/2 blood cultures from 4/20 showing coag-neg staph, with blood cultures 4/23 negative to date. -continue to follow blood cultures -continue vanc, ceftriaxone for now, likely d/c if second set of  blood cultures negative.  # Prophy - lovenox   LOS: 6 days   Janalyn Harder 12/20/2011, 11:43 AM

## 2011-12-20 NOTE — Progress Notes (Signed)
Occupational Therapy Evaluation Patient Details Name: Courtney Faulkner MRN: [id number] DOB: March 19, 1951 Today's Date: 12/20/2011 Time: 4098-1191 OT Time Calculation (min): 33 min  OT Assessment / Plan / Recommendation Clinical Impression  Pt s/p L CVA  and also diagnosed with Lt atrial mass.  Pt to have cardiac surgery on Tuesday.  Will benefit from acute OT to address below problem list in prep for d/c to CIR.    OT Assessment  Patient needs continued OT Services    Follow Up Recommendations  Inpatient Rehab    Equipment Recommendations  Defer to next venue    Frequency Min 3X/week    Precautions / Restrictions Precautions Precautions: Fall Restrictions Weight Bearing Restrictions: No   Pertinent Vitals/Pain NA    ADL  Upper Body Bathing: Simulated;Minimal assistance Where Assessed - Upper Body Bathing: Sitting, bed Lower Body Bathing: Simulated;Maximal assistance Where Assessed - Lower Body Bathing: Sitting, bed Upper Body Dressing: Performed;Minimal assistance Where Assessed - Upper Body Dressing: Sitting, bed Lower Body Dressing: Performed;Maximal assistance Where Assessed - Lower Body Dressing: Sitting, bed Toilet Transfer: Simulated;+2 Total assistance;Comment for patient % (50%) Toilet Transfer Method: Stand pivot Toilet Transfer Equipment: Other (comment) (recliner) Equipment Used: Gait belt ADL Comments: Pt with decreased sitting balance sitting EOB.  Requires min cueing and assist to incorporate RUE during ADLs.    OT Goals Acute Rehab OT Goals OT Goal Formulation: With patient Time For Goal Achievement: 01/03/12 Potential to Achieve Goals: Good ADL Goals Pt Will Perform Grooming: with set-up;Sitting at sink;Standing at sink ADL Goal: Grooming - Progress: Goal set today Pt Will Perform Upper Body Bathing: with set-up;Sitting, chair ADL Goal: Upper Body Bathing - Progress: Goal set today Pt Will Perform Lower Body Bathing: Sit to stand from bed;with  supervision ADL Goal: Lower Body Bathing - Progress: Goal set today Pt Will Transfer to Toilet: with supervision;Ambulation;with DME;3-in-1 ADL Goal: Toilet Transfer - Progress: Progressing toward goals Arm Goals Additional Arm Goal #1: Pt will incorporate RUE during all ADL acitvity. Arm Goal: Additional Goal #1 - Progress: Goal set today Miscellaneous OT Goals Miscellaneous OT Goal #1: Pt will perform dynamic standing balance activity ~5 min with supervision in prep for grooming ADL. OT Goal: Miscellaneous Goal #1 - Progress: Goal set today  Visit Information  Last OT Received On: 12/20/11 Assistance Needed: +2 PT/OT Co-Evaluation/Treatment: Yes    Subjective Data      Prior Functioning  Home Living Lives With: Spouse Available Help at Discharge: Family;Friend(s) Type of Home: House Home Access: Stairs to enter [employer] of Steps: 3 Entrance Stairs-Rails: None Home Layout: Two level;Bed/bath upstairs;Full bath on main level Alternate Level Stairs-Number of Steps: 12 Alternate Level Stairs-Rails: Right;Left Bathroom Shower/Tub: Health visitor: Standard Bathroom Accessibility: Yes How Accessible: Accessible via walker Home Adaptive Equipment: None Prior Function Level of Independence: Independent Able to Take Stairs?: Yes Driving: Yes 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 Communication Communication: Expressive difficulties Dominant Hand: Right    Cognition  Overall Cognitive Status: Impaired Area of Impairment: Following commands Arousal/Alertness: Awake/alert Orientation Level: Appears intact for tasks assessed Behavior During Session: [facility] for tasks performed Following Commands: Follows one step commands inconsistently Cognition - Other Comments: Slow to process    Extremity/Trunk Assessment Right Upper Extremity Assessment RUE ROM/Strength/Tone: Deficits RUE ROM/Strength/Tone Deficits: 3+/5 RUE  Sensation: WFL - Light Touch;WFL - Proprioception RUE Sensation Deficits: see OT eval RUE Coordination: Deficits RUE Coordination Deficits: Decreased coordination Left Upper Extremity Assessment LUE ROM/Strength/Tone: Deficits LUE ROM/Strength/Tone Deficits:  3+/5, question pt's effort during MMT and decreased ability to follow commands LUE Sensation: WFL - Light Touch;WFL - Proprioception LUE Coordination: WFL - gross motor Right Lower Extremity Assessment RLE ROM/Strength/Tone: Deficits RLE ROM/Strength/Tone Deficits: AROM WFL; knee extension 4/5, knee flexion 4/5 dorsiflexion 4/5  RLE Coordination: WFL - gross motor Left Lower Extremity Assessment LLE ROM/Strength/Tone: Within functional levels LLE Sensation: WFL - Light Touch LLE Coordination: WFL - gross motor Trunk Assessment Trunk Assessment: Normal   Mobility Bed Mobility Bed Mobility: Supine to Sit;Sitting - Scoot to Edge of Bed Supine to Sit: 4: Min guard;HOB flat;With rails Sitting - Scoot to Edge of Bed: 4: Min assist Sit to Sidelying Left: Not Tested (comment) Details for Bed Mobility Assistance: Pt able to transition from supine to sit without assistance with extra effort to manage R LE.  Pt required min assist to scoot to edge by manually advancing RLE secondary to R LE becoming stuck on bedrail.  Transfers Sit to Stand: 1: +2 Total assist;With upper extremity assist;From bed (two trials pt approximately 50%) Sit to Stand: Patient Percentage: 50% Stand to Sit: 4: Min assist;With upper extremity assist;To bed;To chair/3-in-1 Details for Transfer Assistance: Sit>stand- Manual facilitation for trunk extension, cueing for hand placement (L hand on rail of bed Rt hand on walker), Tactile cues for anterior wt shift.  Stand>sit - verbal and tactile cues for Left hand placement on armrest of chair.  Verbal cues for controlled descent to chair.   Stand Pivot  -  manual facilitation to manage walker.  Assist to stabilize pt and  maintain balance.    (Two trials from EOB. )   Exercise    Balance Balance Balance Assessed: Yes Static Sitting Balance Static Sitting - Balance Support: No upper extremity supported;Feet unsupported Static Sitting - Level of Assistance: 5: Stand by assistance Static Sitting - Comment/# of Minutes: several minutes on EOB while performing functional activities.  Dynamic Sitting Balance Dynamic Sitting - Balance Support: Feet supported;No upper extremity supported Dynamic Sitting - Level of Assistance: 4: Min assist Reach (Patient is able to reach ___ inches to right, left, forward, back): > 5  Dynamic Sitting - Balance Activities: Lateral lean/weight shifting;Forward lean/weight shifting;Reaching for objects;Reaching across midline  End of Session OT - End of Session Equipment Utilized During Treatment: Gait belt Activity Tolerance: Patient limited by fatigue Patient left: in chair;with call bell/phone within reach;with family/visitor present Nurse Communication: Mobility status  12/20/2011 Cipriano Mile OTR/L Pager [phone] Office [phone]  Cipriano Mile 12/20/2011, 5:16 PM

## 2011-12-20 NOTE — Progress Notes (Signed)
Internal Medicine Teaching Service Attending Note Date: 12/20/2011  Patient name: Courtney Faulkner  Medical record number: [id number]  Date of birth: [birth year]    This patient has been seen and discussed with the house staff. Please see their note for complete details. I concur with their findings with the following additions/corrections: Ms Narducci was transferred from the ICU service to our service on the 25th. Major ongoing medical issues: 1. Atrial myxoma - CVTS surgery Tues. ASA 81mg  2. CVA - 2/2 myxoma with occlusion of the L ICA. On lower dose ASA in prep for OR. Now on heparin gtt. PT / OT / speech therapy. Speech is improving - occasional missed word only.  3. Hypoglycemia - C-peptide elevated. Other studies pending. Consider EUS as there is an assoc with neuroendocrine tumors and myxoma.  4. Bacteremia - on Vanc on rocephin. F/U second cx results.  Elia Nunley 12/20/2011, 1:31 PM

## 2011-12-20 NOTE — Progress Notes (Signed)
Patient Name: Courtney Faulkner Date of Encounter: 12/20/2011    SUBJECTIVE:No complaints. Still having expressive language errors. No chest pain or dyspnea.  TELEMETRY:  NSR: Filed Vitals:   12/19/11 1600 12/19/11 2000 12/20/11 0000 12/20/11 0400  BP: 107/71 101/52 117/86 105/53  Pulse: 93 85 94 83  Temp: 98.6 F (37 C) 98.8 F (37.1 C) 98.8 F (37.1 C) 98.6 F (37 C)  TempSrc: Oral Oral Oral Oral  Resp: 22 18 18 18   Height:      Weight:      SpO2: 93% 95% 97% 95%    Intake/Output Summary (Last 24 hours) at 12/20/11 0844 Last data filed at 12/19/11 1300  Gross per 24 hour  Intake  637.5 ml  Output    155 ml  Net  482.5 ml    LABS: Basic Metabolic Panel:  Basename 12/20/11 0540 12/19/11 0500  NA 134* 137  K 3.4* 3.7  CL 100 100  CO2 27 26  GLUCOSE 125* 95  BUN 13 10  CREATININE 0.58 0.65  CALCIUM 9.4 9.3  MG -- 1.9  PHOS -- 3.9   CBC:  Basename 12/20/11 0540 12/18/11 0303  WBC 6.7 8.8  NEUTROABS -- 5.7  HGB 11.5* 11.9*  HCT 34.3* 36.8  MCV 93.0 94.6  PLT 272 242   Radiology/Studies:  No new  Physical Exam: Blood pressure 105/53, pulse 83, temperature 98.6 F (37 C), temperature source Oral, resp. rate 18, height 5\' 3"  (1.6 m), weight 91.4 kg (201 lb 8 oz), SpO2 95.00%. Weight change:    No rub or gallop.  Right arm and facial weakness. Expressive language difficulty.  ASSESSMENT:  1. 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  2. Left brain stroke with occluded left carotid.   Plan:  1. PT  2. Await surgery  Signed, Lesleigh Noe 12/20/2011, 8:44 AM

## 2011-12-21 LAB — BLOOD GAS, ARTERIAL
Bicarbonate: 24.8 mEq/L — ABNORMAL HIGH (ref 20.0–24.0)
TCO2: 25.9 mmol/L (ref 0–100)
pCO2 arterial: 34.8 mmHg — ABNORMAL LOW (ref 35.0–45.0)
pH, Arterial: 7.467 — ABNORMAL HIGH (ref 7.350–7.400)
pO2, Arterial: 66.3 mmHg — ABNORMAL LOW (ref 80.0–100.0)

## 2011-12-21 LAB — GLUCOSE, CAPILLARY
Glucose-Capillary: 99 mg/dL (ref 70–99)
Glucose-Capillary: 112 mg/dL — ABNORMAL HIGH (ref 70–99)
Glucose-Capillary: 117 mg/dL — ABNORMAL HIGH (ref 70–99)
Glucose-Capillary: 109 mg/dL — ABNORMAL HIGH (ref 70–99)
Glucose-Capillary: 143 mg/dL — ABNORMAL HIGH (ref 70–99)

## 2011-12-21 LAB — CBC
HCT: 33 % — ABNORMAL LOW (ref 36.0–46.0)
Hemoglobin: 10.9 g/dL — ABNORMAL LOW (ref 12.0–15.0)
MCH: 31.1 pg (ref 26.0–34.0)
MCHC: 33 g/dL (ref 30.0–36.0)
MCV: 94 fL (ref 78.0–100.0)
RBC: 3.51 MIL/uL — ABNORMAL LOW (ref 3.87–5.11)

## 2011-12-21 LAB — CREATININE, SERUM
Creatinine, Ser: 0.69 mg/dL (ref 0.50–1.10)
GFR calc non Af Amer: >90 mL/min (ref >90)

## 2011-12-21 NOTE — Progress Notes (Signed)
Subjective:  61 year old with left-sided CVA, left atrial mass possible myxoma scheduled for surgery on Tuesday. Overall she is doing well, no complaints, no chest pain, no new focal weaknesses, ambulating with assistance, no shortness of breath.  Objective:  Vital Signs in the last 24 hours: Temp:  [98.1 F (36.7 C)-98.7 F (37.1 C)] 98.1 F (36.7 C) (04/27 0800) Pulse Rate:  [70-95] 93  (04/27 0800) Resp:  [16-18] 18  (04/27 0800) BP: (105-122)/(55-66) 120/66 mmHg (04/27 0800) SpO2:  [91 %-95 %] 92 % (04/27 0800)  Intake/Output from previous day: 04/26 0701 - 04/27 0700 In: 720 [P.O.:720] Out: -    Physical Exam: General: Well developed, well nourished, in no acute distress. Head:  Normocephalic and atraumatic. Lungs: Clear to auscultation and percussion. Heart: Normal S1 and S2.  No murmur, rubs or gallops.  Pulses: Pulses normal in all 4 extremities. Abdomen: soft, non-tender, positive bowel sounds. Extremities: No clubbing or cyanosis. No edema. Neurologic: Alert and oriented x 3.    Lab Results:  Basename 12/21/11 0605 12/20/11 0540  WBC 7.6 6.7  HGB 10.9* 11.5*  PLT 291 272    Basename 12/21/11 0605 12/20/11 1818 12/20/11 0540  NA -- 137 134*  K -- 3.8 3.4*  CL -- 101 100  CO2 -- 26 27  GLUCOSE -- 119* 125*  BUN -- 18 13  CREATININE 0.69 0.78 --   No results found for this basename: TROPONINI:2,CK,MB:2 in the last 72 hours Hepatic Function Panel  Basename 12/20/11 1818  PROT 6.5  ALBUMIN 2.4*  AST 32  ALT 30  ALKPHOS 54  BILITOT 0.2*  BILIDIR --  IBILI --      Imaging: Dg Chest 2 View  12/20/2011  *RADIOLOGY REPORT*  Clinical Data: Follow up edema  CHEST - 2 VIEW  Comparison: 12/18/2011  Findings: Mild cardiac enlargement.  Lung volumes are low.  There is atelectasis in both lung bases.  Interval improvement and interstitial edema pattern.  IMPRESSION:  1.  Improvement in pulmonary edema. 2.  Cardiac enlargement, low lung volumes and bibasilar  atelectasis.  Original Report Authenticated By: Rosealee Albee, M.D.   Personally viewed.   Telemetry: Brief episodes for approximately 15 beats duration of supraventricular tachycardia last occurring at 2:30 AM last night. Asymptomatic. Personally viewed.    Cardiac Studies:  Left atrial mass on echocardiogram pedunculated along the atrial septum.  No coronary artery disease.  Assessment/Plan:   61 year old female with stroke, left atrial mass, awaiting surgery, gram-positive blood culture, hypoglycemia  Left atrial mass/neoplasm-Dr. Cornelius Moras has been seen in consultation. His plan is to take her to the operating room on Tuesday. Currently she is having no change in symptoms. We will continue to monitor/watch closely.  She had gram-positive blood culture in clusters likely staph, unclear source. She's currently on vancomycin and ceftriaxone.  Hypoglycemia-question insulinoma  Supraventricular tachycardia-paroxysmal. Amiodarone 200 mg twice a day has been started prophylactically. She is currently on metoprolol as well.   Courtney Faulkner 12/21/2011, 8:16 AM

## 2011-12-21 NOTE — Consult Note (Signed)
Referring Provider: Medicine Teaching service ePrimary Care Physician:  Sheila Oats, MD, MD Primary Gastroenterologist:  none  Reason for Consultation:   ? EUS to evaluate for insulinoma  HPI: Courtney Faulkner is a 61 y.o. female previously in good health who was admitted on 12/14/2011 with acute onset of altered mental status and dysarthria. She was also noted to have a glucose of 34 on admission. She has had an extensive evaluation since and has been found to have had several small embolic strokes with resultant right hemiparesis and expressive aphasia which is improving. She is currently on IV heparin. 2-D echo shows a left atrial mass which is felt to represent an atrial myxoma. She is scheduled for surgery next week.  She has also undergone initial evaluation to rule out an insulinoma as she has had persistently low blood sugars requiring continuous dextrose at administration. Patient has no history of any pancreatic disease is totally asymptomatic from a GI standpoint. She underwent an unenhanced CT of the abdomen and pelvis  which was unremarkable. GI is asked to see her to consider endoscopic ultrasound to try to localize an insulinoma.   Past Medical History  Diagnosis Date  . Upper respiratory tract infection     History reviewed. No pertinent past surgical history.  Prior to Admission medications   Medication Sig Start Date End Date Taking? Authorizing Provider  albuterol (PROVENTIL HFA;VENTOLIN HFA) 108 (90 BASE) MCG/ACT inhaler Inhale 2 puffs into the lungs every 6 (six) hours as needed. For wheezing   Yes Historical Provider, MD  predniSONE (DELTASONE) 10 MG tablet Take 10 mg by mouth daily. 12 day pack filled on 12-06-11   Yes Historical Provider, MD    Current Facility-Administered Medications  Medication Dose Route Frequency Provider Last Rate Last Dose  . acetaminophen (TYLENOL) suppository 650 mg  650 mg Rectal Q4H PRN Rodolph Bong, MD      . acetaminophen  (TYLENOL) tablet 650 mg  650 mg Oral Q4H PRN Lyn Records III, MD      . amiodarone (PACERONE) tablet 200 mg  200 mg Oral BID PC Purcell Nails, MD   200 mg at 12/21/11 1042  . antiseptic oral rinse (BIOTENE) solution 15 mL  15 mL Mouth Rinse BID Rodolph Bong, MD   15 mL at 12/21/11 0800  . aspirin EC tablet 81 mg  81 mg Oral Daily Linward Headland, MD   81 mg at 12/21/11 1042  . bisacodyl (DULCOLAX) EC tablet 5 mg  5 mg Oral Once Purcell Nails, MD      . cefTRIAXone (ROCEPHIN) 2 g in dextrose 5 % 50 mL IVPB  2 g Intravenous Q24H Ann Held, PHARMD   2 g at 12/20/11 1731  . chlorhexidine (HIBICLENS) 4 % liquid 4 application  60 mL Topical Once Purcell Nails, MD      . chlorhexidine (HIBICLENS) 4 % liquid 4 application  60 mL Topical Once Purcell Nails, MD      . chlorhexidine (HIBICLENS) 4 % liquid 4 application  60 mL Topical Once Purcell Nails, MD      . dextrose 5 %-0.9 % sodium chloride infusion   Intravenous Continuous Nelda Bucks, MD 30 mL/hr at 12/21/11 0234    . dextrose 50 % solution 50 mL  1 ampule Intravenous PRN Lonia Blood, MD   25 mL at 12/15/11 1804  . enoxaparin (LOVENOX) injection 40 mg  40 mg Subcutaneous Q24H Lovey Newcomer  Janee Morn, MD   40 mg at 12/20/11 2031  . hydrocortisone sodium succinate (SOLU-CORTEF) 100 mg/2 mL injection 50 mg  50 mg Intravenous Q6H Nelda Bucks, MD   50 mg at 12/21/11 1042  . metoprolol tartrate (LOPRESSOR) tablet 12.5 mg  12.5 mg Oral Once Purcell Nails, MD      . ondansetron [facility]) injection 4 mg  4 mg Intravenous Q6H PRN Rodolph Bong, MD      . ondansetron [facility]) injection 4 mg  4 mg Intravenous Q6H PRN Lyn Records III, MD      . temazepam (RESTORIL) capsule 15 mg  15 mg Oral Once PRN Purcell Nails, MD      . vancomycin (VANCOCIN) IVPB 1000 mg/200 mL premix  1,000 mg Intravenous Q8H Ann Held, PHARMD   1,000 mg at 12/21/11 1250  . DISCONTD: bisacodyl (DULCOLAX) EC tablet 5 mg  5 mg Oral Once  Purcell Nails, MD      . DISCONTD: chlorhexidine (HIBICLENS) 4 % liquid 4 application  60 mL Topical Once Purcell Nails, MD      . DISCONTD: chlorhexidine (HIBICLENS) 4 % liquid 4 application  60 mL Topical Once Purcell Nails, MD      . DISCONTD: temazepam (RESTORIL) capsule 15 mg  15 mg Oral Once PRN Purcell Nails, MD        Allergies as of 12/14/2011  . (No Known Allergies)    Family History  Problem Relation Age of Onset  . Coronary artery disease Neg Hx     History   Social History  . Marital Status: Married    Spouse Name: N/A    Number of Children: N/A  . 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 Smoking status: Former Smoker    Quit date: 07/15/2002  . Smokeless tobacco: Never Used  . Alcohol Use: No  . Drug Use: No  . Sexually Active: Yes    Birth Control/ Protection: Post-menopausal   Other Topics Concern  . Not on file   Social History Narrative  . No narrative on file    Review of Systems: Pertinent positive and negative review of systems were noted in the above HPI section.  All other review of systems was otherwise negative.  Physical Exam: Vital signs in last 24 hours: Temp:  [98.1 F (36.7 C)-98.7 F (37.1 C)] 98.1 F (36.7 C) (04/27 0800) Pulse Rate:  [70-95] 93  (04/27 0800) Resp:  [16-18] 18  (04/27 0800) BP: (105-120)/(55-66) 120/66 mmHg (04/27 0800) SpO2:  [91 %-95 %] 92 % (04/27 0800) Last BM Date: 12/20/11 General:   Alert,  Well-developed, well-nourished, pleasant and cooperative in NAD answers questions appropriately speech is slow but not currently dysarthric Head:  Normocephalic and atraumatic. Eyes:  Sclera clear, no icterus.   Conjunctiva pink. Ears:  Normal auditory acuity. Nose:  No deformity, discharge,  or lesions. Mouth:  No deformity or lesions.   Neck:  Supple; no masses or thyromegaly. Lungs:  Clear throughout to auscultation.   No  wheezes, crackles, or rhonchi. Heart:  Regular rate and rhythm; no murmurs, clicks, rubs,  or gallops. Abdomen:  Soft,nontender, BS active,nonpalp mass or hsm.   Rectal:  Deferred  Msk:  Symmetrical without gross deformities. . Pulses:  Normal pulses noted. Extremities:  Without clubbing or edema. Neurologic:  Alert and  oriented x4;  right  hemiparesis Skin:  Intact without significant lesions or rashes.. Psych:  Alert and cooperative. Normal mood and affect.  Intake/Output from previous day: 04/26 0701 - 04/27 0700 In: 720 [P.O.:720] Out: -  Intake/Output this shift:    Lab Results:  Basename 12/21/11 0605 12/20/11 0540  WBC 7.6 6.7  HGB 10.9* 11.5*  HCT 33.0* 34.3*  PLT 291 272   BMET  Basename 12/21/11 0605 12/20/11 1818 12/20/11 0540 12/19/11 0500  NA -- 137 134* 137  K -- 3.8 3.4* 3.7  CL -- 101 100 100  CO2 -- 26 27 26   GLUCOSE -- 119* 125* 95  BUN -- 18 13 10   CREATININE 0.69 0.78 0.58 --  CALCIUM -- 9.3 9.4 9.3   LFT  Basename 12/20/11 1818  PROT 6.5  ALBUMIN 2.4*  AST 32  ALT 30  ALKPHOS 54  BILITOT 0.2*  BILIDIR --  IBILI --   PT/INR  Basename 12/20/11 1818  LABPROT 13.9  INR 1.05     Studies/Results: Dg Chest 2 View  12/20/2011  *RADIOLOGY REPORT*  Clinical Data: Follow up edema  CHEST - 2 VIEW  Comparison: 12/18/2011  Findings: Mild cardiac enlargement.  Lung volumes are low.  There is atelectasis in both lung bases.  Interval improvement and interstitial edema pattern.  IMPRESSION:  1.  Improvement in pulmonary edema. 2.  Cardiac enlargement, low lung volumes and bibasilar atelectasis.  Original Report Authenticated By: Rosealee Albee, M.D.    IMPRESSION:  #22 61 year old female status post multiple small embolic CVAs 4/ 16/1096 with right hemiparesis and dysarthria. She has been improving from a neurologic standpoint, and is on IV heparin #2 left atrial mass consistent with myxoma-patient scheduled for surgery next week. #3 persistent  hypoglycemia concerning for underlying insulinoma. Initial unenhanced imaging unrevealing. Patient needs contrast enhanced MRI of the abdomen for further evaluation, and if this is negative may benefit from EUS of the pancreas.   PLAN: #1 would obtain MRI of the abdomen attention to the pancreas, contrast enhanced. #2 consider EUS depending on findings of MRI. [facility] is covering for Dr. Elnoria Howard this weekend, he will assume care on Monday   Alva Broxson  12/21/2011, 12:53 PM

## 2011-12-21 NOTE — Progress Notes (Signed)
Physical Therapy Treatment Patient Details Name: Courtney Faulkner MRN: [id number] DOB: [birth year] Today's Date: 12/21/2011 Time: 4098-1191 PT Time Calculation (min): 24 min  PT Assessment / Plan / Recommendation Comments on Treatment Session  Pt continues to need +2 assist for transfers but with less assistance.  Limited session due to dizziness.  BP sitting 1- 97/61, sitting2-104/66, standing- 118/79.  Blood Sugar- 112.  Unknown cause of dizziness however RN aware.  Dizziness did finally decrease in recliner after ~10 min.    Follow Up Recommendations  Inpatient Rehab    Equipment Recommendations  Defer to next venue    Frequency Min 4X/week   Plan Discharge plan remains appropriate;Frequency needs to be updated    Precautions / Restrictions Precautions Precautions: Fall Restrictions Weight Bearing Restrictions: No   Pertinent Vitals/Pain     Mobility  Bed Mobility Bed Mobility: Supine to Sit Supine to Sit: 4: Min guard;HOB flat;With rails Sitting - Scoot to Edge of Bed: 4: Min assist Details for Bed Mobility Assistance: Minguard for safety and pt needed extra time to complete task to advance right LE off bed.  Pt needed min(A) to scoot right hip to EOB and complete bed mobility. Transfers Transfers: Sit to Stand;Stand to Sit;Stand Pivot Transfers Sit to Stand: 1: +2 Total assist;With upper extremity assist;From bed Sit to Stand: Patient Percentage: 60% Stand to Sit: 4: Min assist;To chair/3-in-1;With armrests Stand Pivot Transfers: 1: +2 Total assist Stand Pivot Transfers: Patient Percentage: 60% Details for Transfer Assistance: (A) to maintain balance during transfer with cues at hips for forward flexion during sit<-> stand.  Manual and tactile cues at hips to promote proper weight shift to advance LE to recliner. Ambulation/Gait Ambulation/Gait Assistance: 1: +2 Total assist Ambulation/Gait: Patient Percentage: 60 Ambulation Distance (Feet): 4 Feet Assistive device: 2  person hand held assist Ambulation/Gait Assistance Details: Pt able to take 3 side steps to recliner with tactile cues for proper weight shifts to advance LE with extra time to advance RLE toward recliner but able to advance RLE without assist. Modified Rankin (Stroke Patients Only) Modified Rankin: Severe disability    Exercises     PT Goals Acute Rehab PT Goals PT Goal Formulation: With patient/family Time For Goal Achievement: 01/03/12 Potential to Achieve Goals: Good Pt will go Supine/Side to Sit: Independently;with HOB 0 degrees PT Goal: Supine/Side to Sit - Progress: Progressing toward goal Pt will Sit at Edge of Bed: Independently PT Goal: Sit at Edge Of Bed - Progress: Progressing toward goal Pt will go Sit to Supine/Side: Independently;with HOB 0 degrees PT Goal: Sit to Supine/Side - Progress: Progressing toward goal Pt will go Sit to Stand: with supervision;with upper extremity assist PT Goal: Sit to Stand - Progress: Progressing toward goal Pt will go Stand to Sit: with modified independence PT Goal: Stand to Sit - Progress: Progressing toward goal Pt will Transfer Bed to Chair/Chair to Bed: with supervision PT Transfer Goal: Bed to Chair/Chair to Bed - Progress: Progressing toward goal Pt will Ambulate: 16 - 50 feet;with supervision;with least restrictive assistive device;with rolling walker PT Goal: Ambulate - Progress: Progressing toward goal  Visit Information  Last PT Received On: 12/21/11 Assistance Needed: +2    Subjective Data  Subjective: "I'm dizzy."   Cognition  Overall Cognitive Status: Impaired Area of Impairment: Problem solving Arousal/Alertness: Awake/alert Orientation Level: Appears intact for tasks assessed Behavior During Session: [facility] for tasks performed Problem Solving: Pt slow to problem solve and needs extra time to complete task Cognition - Other  Comments: Slow to process    Balance  Balance Balance Assessed: Yes Static Sitting  Balance Static Sitting - Balance Support: No upper extremity supported;Feet unsupported Static Sitting - Level of Assistance: 5: Stand by assistance Static Sitting - Comment/# of Minutes: ~ 5 minutes to perpare for transfer Static Standing Balance Static Standing - Balance Support: Bilateral upper extremity supported Static Standing - Level of Assistance: 3: Mod assist Static Standing - Comment/# of Minutes: (A) to maintain balance due to posterior lean.  Tactile cues to promote midline but unable to maintain without assistance  End of Session PT - End of Session Equipment Utilized During Treatment: Gait belt Activity Tolerance: Treatment limited secondary to medical complications (Comment) (dizziness) Patient left: in chair;with call bell/phone within reach;with family/visitor present Nurse Communication: Mobility status (dizziness)    Natacha Jepsen 12/21/2011, 1:57 PM Jake Shark, PT DPT [phone]

## 2011-12-21 NOTE — Progress Notes (Signed)
History: Courtney Faulkner is an 62 y.o. female with dysarthria, aphasia and right sided weakness. According to son at bedside, his father heard her vaccuming at ~08:30 on 12/14/2011. He came down stairs at 11:30 and found her confused and dysarthric. EMS called and found BS 36. Patient was subsequently brought to the ED. She arrive at 1214. In the ED patient was given some orange juice and her blood CBG went to 166. Patient subsequently had a head CT done which was negative. Initially a code stroke was called however it was canceled secondary to patient's hypoglycemic spells. Per family patient was recently been treated for upper respiratory symptoms with prednisone and albuterol. Patient and family deny any recent fevers, no chills, no chest pain, no diarrhea, no constipation, no dysuria, no generalized weakness. Patient's family does endorse some shortness of breath on exertion when patient climbs up stairs which has been ongoing for the past 2 weeks. On exam and during the interview patient has some dysarthric speech is alert to self only and has an incoherent speech as well. After treatment, CBG 166, pt remains confused, and having difficulty following commands. Neuro consult called by Dr. Janee Morn for this reason. Patient was not a TPA candidate secondary to presenting beyond the tPA window. She was transferred to the ICU after admission for hypoglycemia and neuro decline for further evaluation and treatment.   LSN: [id number] 12/14/11 tPA Given: no; outside window Interval: Baseline (04/24 1900) Level of Consciousness (1a. ): Alert, keenly responsive (04/25 0800) LOC Questions (1b. ): Answers both questions correctly (04/25 0800) LOC Commands (1c. ): Performs both tasks correctly (04/25 0800) Best Gaze (2. ): Normal (04/25 0800) Visual (3. ): No visual loss (04/25 0800) Facial Palsy (4. ): Minor paralysis (04/25 0800) Motor Arm, Left (5a. ): No drift (04/25 0800) Motor Arm, Right (5b. ): Drift (04/25  0800) Motor Leg, Left (6a. ): No drift (04/25 0800) Motor Leg, Right (6b. ): No drift (04/25 0800) Limb Ataxia (7. ): Present in two limbs (04/25 0800) Sensory (8. ): Mild-to-moderate sensory loss, patient feels pinprick is less sharp or is dull on the affected side, or there is a loss of superficial pain with pinprick, but patient is aware of being touched (04/25 0800) Best Language (9. ): Mild-to-moderate aphasia (04/25 0800) Dysarthria (10. ): Mild-to-moderate dysarthria, patient slurs at least some words and, at worst, can be understood with some difficulty (04/25 0800) Inattention/Extinction: Visual/tactile/auditory/spatial/personal inattention (04/25 0800) Total: 8  (04/25 0800)  Subjective: Doing ok.  Reports spirits good all things considered.  No pain.    Objective: BP 120/66  Pulse 93  Temp(Src) 98.1 F (36.7 C) (Oral)  Resp 18  Ht 5\' 3"  (1.6 m)  Wt 91.4 kg (201 lb 8 oz)  BMI 35.69 kg/m2  SpO2 92% Telemetry: SR  CBGs  Basename 12/21/11 0758 12/21/11 0555 12/21/11 0413 12/21/11 0200 12/20/11 2346 12/20/11 2239 12/20/11 2029 12/20/11 1654 12/20/11 1255 12/20/11 0732  GLUCAP 116* 113* 99 97 96 107* 110* 123* 162* 118*   Diet: General thin liquids  Activity: Bedrest, Bathroom privileges  DVT Prophylaxis: Lovenox 40 mg sq daily   Medications: Scheduled:   . amiodarone  200 mg Oral BID PC  . antiseptic oral rinse  15 mL Mouth Rinse BID  . aspirin EC  81 mg Oral Daily  . bisacodyl  5 mg Oral Once  . cefTRIAXone (ROCEPHIN)  IV  2 g Intravenous Q24H  . chlorhexidine  60 mL Topical Once  .  chlorhexidine  60 mL Topical Once  . chlorhexidine  60 mL Topical Once  . enoxaparin  40 mg Subcutaneous Q24H  . hydrocortisone sodium succinate  50 mg Intravenous Q6H  . metoprolol tartrate  12.5 mg Oral Once  . potassium chloride  40 mEq Oral Once  . vancomycin  1,000 mg Intravenous Q8H  . DISCONTD: aspirin EC  325 mg Oral Daily  . DISCONTD: aspirin  300 mg Rectal Daily  .  DISCONTD: aspirin  325 mg Oral Daily  . DISCONTD: bisacodyl  5 mg Oral Once  . DISCONTD: chlorhexidine  60 mL Topical Once  . DISCONTD: chlorhexidine  60 mL Topical Once    Neurologic Exam: Mental Status: Alert, oriented, thought content appropriate.  Speech occasionally hesitant fluent.  Some difficulty following commands when asked to move right or left side specifically. Otherwise, able to follow 3 step commands without difficulty. Cranial Nerves: II- Visual fields grossly intact. III/IV/VI-Extraocular movements intact.  Pupils reactive bilaterally. V/VII-Smile mildly asymmetric right facial droop.   VIII-hearing grossly intact IX/X-normal gag XI-bilateral shoulder shrug XII-midline tongue extension Motor:4/5 RUE, R grip weaker than left. LUE, LLE, RLE grossly 5/5 Sensory: Light touch subjectively diminished on the right hemibody  Deep Tendon Reflexes: slightly hyperreflexic on the right, 2+ left Cerebellar: Coordination is slow but accurate  Lab Results: Basic Metabolic Panel:  Lab 12/21/11 1610 12/20/11 1818 12/20/11 0540 12/19/11 0500 12/14/11 1456  NA -- 137 134* -- --  K -- 3.8 3.4* -- --  CL -- 101 100 -- --  CO2 -- 26 27 -- --  GLUCOSE -- 119* 125* -- --  BUN -- 18 13 -- --  CREATININE 0.69 0.78 -- -- --  CALCIUM -- 9.3 9.4 -- --  MG -- -- -- 1.9 1.9  PHOS -- -- -- 3.9 --   Liver Function Tests:  Lab 12/20/11 1818 12/18/11 0303  AST 32 25  ALT 30 21  ALKPHOS 54 54  BILITOT 0.2* 0.4  PROT 6.5 6.1  ALBUMIN 2.4* 2.5*   CBC:  Lab 12/21/11 0605 12/20/11 0540 12/18/11 0303 12/17/11 0300  WBC 7.6 6.7 -- --  NEUTROABS -- -- 5.7 5.7  HGB 10.9* 11.5* -- --  HCT 33.0* 34.3* -- --  MCV 94.0 93.0 -- --  PLT 291 272 -- --   Cardiac Enzymes:  Lab 12/16/11 0311 12/15/11 2005 12/15/11 1252  CKTOTAL 349* 417* 407*  CKMB 7.9* 10.6* 11.0*  CKMBINDEX -- -- --  TROPONINI 0.98* 0.65* 1.41*   BNP:  Lab 12/15/11 0900 12/14/11 1456  PROBNP 441.6* 188.5*    CBG:  Lab 12/21/11 0758 12/21/11 0555 12/21/11 0413 12/21/11 0200 12/20/11 2346 12/20/11 2239  GLUCAP 116* 113* 99 97 96 107*   Hemoglobin A1C:  Lab 12/20/11 1818  HGBA1C 5.1   Fasting Lipid Panel:  Lab 12/16/11 0311  CHOL 134  HDL 45  LDLCALC 71  TRIG 90  CHOLHDL 3.0  LDLDIRECT --   Thyroid Function Tests:  Lab 12/16/11 1103  TSH 3.588  T4TOTAL --  FREET4 --  T3FREE --  THYROIDAB --   Coagulation:  Lab 12/20/11 1818 12/17/11 1906  LABPROT 13.9 13.8  INR 1.05 1.04   Anemia Panel:  Lab 12/15/11 0900  VITAMINB12 287  FOLATE 5.8  FERRITIN 200  TIBC 231*  IRON 39*  RETICCTPCT 2.9   Urinalysis:  Lab 12/14/11 1255  COLORURINE YELLOW  LABSPEC 1.015  PHURINE 7.5  GLUCOSEU 250*  HGBUR NEGATIVE  BILIRUBINUR NEGATIVE  KETONESUR  NEGATIVE  PROTEINUR NEGATIVE  UROBILINOGEN 0.2  NITRITE NEGATIVE  LEUKOCYTESUR NEGATIVE    Study Results: CT of the brain  12/14/2011 Normal CT of the head without contrast.   12/15/2011 Developing cytotoxic edema correlates with the observed pattern of restricted diffusion on MR, representing acute infarction. This primarily involves the left basal ganglia. Smaller areas of cortical and subcortical infarction seen on MRI in the left hemisphere are too small to be easily visible on CT. No incipient uncal or transtentorial herniation, although minimal 1 mm left to right shift may be present. No hemorrhagic transformation.   CT Angio Head  12/16/2011 Acute non hemorrhagic infarct left basal ganglia, left uncus and left corona radiata. 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 Collateral flow to the left anterior cerebral artery and left middle cerebral artery branches. The flow within the left middle cerebral artery branches is diminished suggesting that collateral flow is not adequate.   CT Angio Neck  12/16/2011 Abnormal appearance of the left internal carotid artery with appearance of dissection with complete occlusion at the C2  level. Abnormal appearance of the proximal left common carotid artery. Question if this is related to artifact versus dissection versus atherosclerotic type changes. Mild narrowing proximal right common carotid artery. Prominent ectasia of the proximal right common carotid artery with fold and mild narrowing. Mild irregularity at the right carotid bifurcation without measurable stenosis. Mild to moderate narrowing proximal right vertebral artery. Mild irregularity throughout the course of the right vertebral artery. Left vertebral artery arises directly from the aortic arch. Mild narrowing proximal left vertebral artery. Evaluation of portions of the proximal left vertebral artery is limited by artifact. Nonspecific parenchymal changes lung apices greater on the right. Findings can be evaluated with a formal chest CT.   MRI of the brain  12/14/2011 Moderate sized acute left hemisphere infarct which appears to be secondary to an acute left internal carotid artery occlusion. No visible hemorrhage or midline shift. Emboli to the left middle cerebral artery and its branches is not excluded.   2D Echocardiogram  EF 60-65% with no source of embolus. Left atrial mass attached to intra-atrial septum highly suggestive of myxoma.  Carotid Doppler Right: no ICA stenosis. Left: ICA appears occluded. Bilateral vertebral artery flow is antegrade.   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   CXR  12/18/2011 Some increase in airspace disease likely due to progressive edema.   12/16/2011 Appearance of chest is similar to the recent prior study, again suggesting a background of mild interstitial pulmonary edema. Overall, aeration has slightly improved, with decreasing small bilateral pleural effusions.   12/14/2011 Cardiomegaly and bilateral airspace disease most consistent with pulmonary edema.   CT chest  12/17/2011 Respiratory motion degraded examination  with poor inspiration. Scattered pulmonary parenchymal changes some which appears to represent scarring/interstitial fibrosis. Right upper lobe focal bronchiectasis. Scattered regions of mild consolidation may represent scarring although difficult to exclude superimposed infection   CT Abdomen and Pelvis  12/17/2011 On this unenhanced examination, no definitive pancreatic masses identified however, evaluation without contrast is markedly limited. For detection of a neuroendocrine tumor, preferable examination is contrast enhanced MR. If the patient cannot hold her breath, CT with contrast with pancreatic protocol can be obtained. Very small amount of free fluid. Etiology indeterminate.   Cardiac Cath 12/18/2011 1. Normal coronary arteries. 2. Normal abdominal aorta, iliac and femoral arteries. 3. 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 4. Left ventriculography was not performed.   Therapies: PT: R side weakness, expressive difficulty and difficulty following commands. Rec CIR consult.  SLP: Verbal expression marked by neologisms, semantic paraphasias and emerging emergent awareness of errors, however unable to self-correct. Marked language and cognitive improvement with increased sustained attention (15 minutes), improved basic functional problem solving as well as better able to follow 1 step directions (no cues).   Assessment/Plan:  Ms. Courtney Faulkner is a 61 y.o. 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 Neuro improvement since admission. On no antiplatelets prior to admission. Now on aspirin 325 mg orally every day for secondary stroke prevention. Patient with resultant right hemiparesis and expressive aphasia with improvement. Ok for IV heparin from neuro standpoint. Patient is at risk for additional neuro events.  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 Cath clear for OR.  -No sign of endocarditis on TEE, on Vanc and rocephin started 12/16/2011  -left ICA occlusion suspect secondary to myxoma emboli vs dissection  -tachycardia  -hypoglycemia, recurrent, ? Etiology, Insulinoma not confirmed on CT. Workup underway  -Nonspecific parenchymal changes lung apices greater on the right. With pulmonary edema improving today.  -elevated cardiac enzymes, cardiac related.  -short PR  -obesity, class 2, Body mass index is 35.69 kg/(m^2).   Hospital day # 7  TREATMENT/PLAN  -surgery to remove myxoma. Agree with plans for next Tues  -ok for IV heparin during surgery from neuro standpoint   -ok to mobilize out of bed .  Patient seen and examined by Dr. Pearlean Brownie who agrees with above.  LOS: 7 days   Marya Fossa PA-C [facility] [phone] 12/21/2011  8:26 AM

## 2011-12-21 NOTE — Progress Notes (Signed)
Subjective: States she is doing well today.  Denies problems with bowel or bladder.  Is eating well with no nausea or vomiting.  States weakness feels better today and that her word finding is better.  She has been up with PT yesterday.    Multiple coordination phone calls today with Neuro, CVTS, and cardiology.  Heparin drip to be started in the OR per their protocol and okay to continue aspirin at 81 mg daily for now.  Objective: Vital signs in last 24 hours: Filed Vitals:   12/20/11 2000 12/21/11 0000 12/21/11 0400 12/21/11 0800  BP: 106/55 105/56 113/63 120/66  Pulse: 77 81 70 93  Temp: 98.6 F (37 C) 98.3 F (36.8 C) 98.3 F (36.8 C) 98.1 F (36.7 C)  TempSrc: Oral Oral Oral   Resp: 18 16 18 18   Height:      Weight:      SpO2: 94% 91% 93% 92%   Weight change:   Intake/Output Summary (Last 24 hours) at 12/21/11 1046 Last data filed at 12/20/11 1700  Gross per 24 hour  Intake    480 ml  Output      0 ml  Net    480 ml   Physical Exam: General: alert, cooperative, and in no apparent distress HEENT: pupils equal round and reactive to light, vision grossly intact, oropharynx clear and non-erythematous, right lower facial droop noted improved from yesterday.  Neck: supple, no lymphadenopathy Lungs: clear to ascultation bilaterally, normal work of respiration, no wheezes, rales, ronchi Heart: regular rate and rhythm, no murmurs, gallops, or rubs Abdomen: soft, non-tender, non-distended, normal bowel sounds  Extremities: no cyanosis, clubbing, or edema Neurologic: alert & oriented X3, right lower facial droop noted, otherwise CN II-VI and VIII-XII intact, strength 4/5 in right shoulder flexion and adduction, 4-5/5 in right elbow flexion/extension, otherwise 5/5 throughout.  Lab Results: Basic Metabolic Panel:  Lab 12/21/11 4782 12/20/11 1818 12/20/11 0540 12/19/11 0500 12/14/11 1456  NA -- 137 134* -- --  K -- 3.8 3.4* -- --  CL -- 101 100 -- --  CO2 -- 26 27 -- --    GLUCOSE -- 119* 125* -- --  BUN -- 18 13 -- --  CREATININE 0.69 0.78 -- -- --  CALCIUM -- 9.3 9.4 -- --  MG -- -- -- 1.9 1.9  PHOS -- -- -- 3.9 --   Liver Function Tests:  Lab 12/20/11 1818 12/18/11 0303  AST 32 25  ALT 30 21  ALKPHOS 54 54  BILITOT 0.2* 0.4  PROT 6.5 6.1  ALBUMIN 2.4* 2.5*   CBC:  Lab 12/21/11 0605 12/20/11 0540 12/18/11 0303 12/17/11 0300  WBC 7.6 6.7 -- --  NEUTROABS -- -- 5.7 5.7  HGB 10.9* 11.5* -- --  HCT 33.0* 34.3* -- --  MCV 94.0 93.0 -- --  PLT 291 272 -- --   Cardiac Enzymes:  Lab 12/16/11 0311 12/15/11 2005 12/15/11 1252  CKTOTAL 349* 417* 407*  CKMB 7.9* 10.6* 11.0*  CKMBINDEX -- -- --  TROPONINI 0.98* 0.65* 1.41*   BNP:  Lab 12/15/11 0900 12/14/11 1456  PROBNP 441.6* 188.5*   CBG:  Lab 12/21/11 1023 12/21/11 0758 12/21/11 0555 12/21/11 0413 12/21/11 0200 12/20/11 2346  GLUCAP 112* 116* 113* 99 97 96   Hemoglobin A1C:  Lab 12/20/11 1818  HGBA1C 5.1   Fasting Lipid Panel:  Lab 12/16/11 0311  CHOL 134  HDL 45  LDLCALC 71  TRIG 90  CHOLHDL 3.0  LDLDIRECT --  Thyroid Function Tests:  Lab 12/16/11 1103  TSH 3.588  T4TOTAL --  FREET4 --  T3FREE --  THYROIDAB --   Coagulation:  Lab 12/20/11 1818 12/17/11 1906  LABPROT 13.9 13.8  INR 1.05 1.04   Anemia Panel:  Lab 12/15/11 0900  VITAMINB12 287  FOLATE 5.8  FERRITIN 200  TIBC 231*  IRON 39*  RETICCTPCT 2.9   Urinalysis:  Lab 12/14/11 1255  COLORURINE YELLOW  LABSPEC 1.015  PHURINE 7.5  GLUCOSEU 250*  HGBUR NEGATIVE  BILIRUBINUR NEGATIVE  KETONESUR NEGATIVE  PROTEINUR NEGATIVE  UROBILINOGEN 0.2  NITRITE NEGATIVE  LEUKOCYTESUR NEGATIVE   Misc. Labs: Parathyroid hormone: Pending Proinsulin/Insulin Level: Pending C-Peptide: 4.17 (H) AM Cortisol: 10.9 Sed Rate: 46 ANA: Neg Anti-DNA: 1 (Neg) RF: <10 C3: 118 C4: 51 CRP: 4.85 ANCA: Neg MPO/PR-3: <1  Micro Results: Recent Results (from the past 240 hour(s))  CULTURE, BLOOD (ROUTINE X 2)      Status: Normal   Collection Time   12/14/11  4:14 PM      Component Value Range Status Comment   Specimen Description BLOOD ARM RIGHT   Final    Special Requests BOTTLES DRAWN AEROBIC AND ANAEROBIC 10CC   Final    Culture  Setup Time [id number]   Final    Culture NO GROWTH 5 DAYS   Final    Report Status 12/20/2011 FINAL   Final   CULTURE, BLOOD (ROUTINE X 2)     Status: Normal   Collection Time   12/14/11  5:45 PM      Component Value Range Status Comment   Specimen Description BLOOD RIGHT ARM   Final    Special Requests BOTTLES DRAWN AEROBIC AND ANAEROBIC 10CC   Final    Culture  Setup Time [id number]   Final    Culture     Final    Value: STAPHYLOCOCCUS SPECIES (COAGULASE NEGATIVE)     Note: THE SIGNIFICANCE OF ISOLATING THIS ORGANISM FROM A SINGLE SET OF BLOOD CULTURES WHEN MULTIPLE SETS ARE DRAWN IS UNCERTAIN. PLEASE NOTIFY THE MICROBIOLOGY DEPARTMENT WITHIN ONE WEEK IF SPECIATION AND SENSITIVITIES ARE REQUIRED.     Note: Gram Stain Report Called to,Read Back By and Verified With: ELAINE THIELEN @1057  12/17/11 BY KRAWS   Report Status 12/19/2011 FINAL   Final   MRSA PCR SCREENING     Status: Normal   Collection Time   12/14/11  5:52 PM      Component Value Range Status Comment   MRSA by PCR NEGATIVE  NEGATIVE  Final   CULTURE, BLOOD (ROUTINE X 2)     Status: Normal (Preliminary result)   Collection Time   12/17/11 12:50 PM      Component Value Range Status Comment   Specimen Description BLOOD LEFT FOREARM   Final    Special Requests     Final    Value: BOTTLES DRAWN AEROBIC AND ANAEROBIC 4CC RED  6CC BLUE   Culture  Setup Time [id number]   Final    Culture     Final    Value:        BLOOD CULTURE RECEIVED NO GROWTH TO DATE CULTURE WILL BE HELD FOR 5 DAYS BEFORE ISSUING A FINAL NEGATIVE REPORT   Report Status PENDING   Incomplete   CULTURE, BLOOD (ROUTINE X 2)     Status: Normal (Preliminary result)   Collection Time   12/17/11  1:00 PM      Component Value Range  Status Comment   Specimen  Description BLOOD RIGHT ARM   Final    Special Requests BOTTLES DRAWN AEROBIC ONLY 1.5CC   Final    Culture  Setup Time [id number]   Final    Culture     Final    Value:        BLOOD CULTURE RECEIVED NO GROWTH TO DATE CULTURE WILL BE HELD FOR 5 DAYS BEFORE ISSUING A FINAL NEGATIVE REPORT   Report Status PENDING   Incomplete    Studies/Results: Dg Chest 2 View  12/20/2011  *RADIOLOGY REPORT*  Clinical Data: Follow up edema  CHEST - 2 VIEW  Comparison: 12/18/2011  Findings: Mild cardiac enlargement.  Lung volumes are low.  There is atelectasis in both lung bases.  Interval improvement and interstitial edema pattern.  IMPRESSION:  1.  Improvement in pulmonary edema. 2.  Cardiac enlargement, low lung volumes and bibasilar atelectasis.  Original Report Authenticated By: Rosealee Albee, M.D.   Medications: I have reviewed the patient's current medications. Scheduled Meds:   . amiodarone  200 mg Oral BID PC  . antiseptic oral rinse  15 mL Mouth Rinse BID  . aspirin EC  81 mg Oral Daily  . bisacodyl  5 mg Oral Once  . cefTRIAXone (ROCEPHIN)  IV  2 g Intravenous Q24H  . chlorhexidine  60 mL Topical Once  . chlorhexidine  60 mL Topical Once  . chlorhexidine  60 mL Topical Once  . enoxaparin  40 mg Subcutaneous Q24H  . hydrocortisone sodium succinate  50 mg Intravenous Q6H  . metoprolol tartrate  12.5 mg Oral Once  . potassium chloride  40 mEq Oral Once  . vancomycin  1,000 mg Intravenous Q8H  . DISCONTD: aspirin EC  325 mg Oral Daily  . DISCONTD: aspirin  300 mg Rectal Daily  . DISCONTD: aspirin  325 mg Oral Daily  . DISCONTD: bisacodyl  5 mg Oral Once  . DISCONTD: chlorhexidine  60 mL Topical Once  . DISCONTD: chlorhexidine  60 mL Topical Once   Continuous Infusions:   . dextrose 5 % and 0.9% NaCl 30 mL/hr at 12/21/11 0234   PRN Meds:.acetaminophen, acetaminophen, dextrose, ondansetron (ZOFRAN) IV, ondansetron (ZOFRAN) IV, temazepam, DISCONTD:  temazepam  Assessment/Plan: 1.  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  Plan for CVTS surgery next week, likely Tuesday.  Discussed with Neuro and CVTS today.  The indications for heparin drip are only during surgery.  Heparin for surgery was approved by neuro.  Cardiology workup completed with negative Cath.   - appreciate CVTS, cardiology recs  - plan for surgical removal tentatively Tuesday by right chest approach rather than sternotomy per note from Dr. Cornelius Moras.  - continue aspirin 81 mg daily (does not need to be discontinued in anticipation of CVTS surg next week, per Dr. Tyrone Sage)  - Cleared for heparin drip from neuro  2.  Recurrent hypoglycemia - CBG's currently well-controlled on D5-NS, though hypoglycemia noted as recently as 4/24. Elevated C-peptide, proinsulin/insulin ratio pending. Differential includes insulinoma vs infectious etiology (with myxomas as possible source).  -await proinsulin/insulin results  - Consulted [facility] for possible EUS.  Question will be timing.  Dr. Christella Hartigan is the only one from [place] that dose EUS at this time so will discuss the case with him on Monday.   3.  Acute CVA - likely represents thrombus from atrial myxoma, also considering L ICA dissection (given CT neck results).  -continue aspirin daily, decrease to 81 mg per CVTS recs  -  continue PT, OT, SLP, to improve patient's RUE weakness, and R CN VII function.  Recommendation for CIR when cleared following cardiac surgery.  4. Positive Blood cultures:  1/2 blood cultures from 4/20 showing coag-neg staph, with blood cultures 4/23 negative to date. Remains Afebrile since admission.   -continue to follow blood cultures  -continue vanc, ceftriaxone for now, likely d/c if second set of blood cultures negative.   5. Prophy - lovenox, ASA 81   LOS: 7 days   Deirdre Gryder 12/21/2011, 10:46 AM

## 2011-12-22 ENCOUNTER — Inpatient Hospital Stay (HOSPITAL_COMMUNITY): Payer: BC Managed Care – PPO

## 2011-12-22 DIAGNOSIS — Z01810 Encounter for preprocedural cardiovascular examination: Secondary | ICD-10-CM

## 2011-12-22 LAB — BASIC METABOLIC PANEL
BUN: 18 mg/dL (ref 6–23)
Calcium: 9.1 mg/dL (ref 8.4–10.5)
Chloride: 103 mEq/L (ref 96–112)
GFR calc Af Amer: >90 mL/min (ref >90)
GFR calc non Af Amer: >90 mL/min (ref >90)
Glucose, Bld: 104 mg/dL — ABNORMAL HIGH (ref 70–99)
Potassium: 3.7 mEq/L (ref 3.5–5.1)

## 2011-12-22 LAB — GLUCOSE, CAPILLARY
Glucose-Capillary: 123 mg/dL — ABNORMAL HIGH (ref 70–99)
Glucose-Capillary: 112 mg/dL — ABNORMAL HIGH (ref 70–99)
Glucose-Capillary: 103 mg/dL — ABNORMAL HIGH (ref 70–99)
Glucose-Capillary: 96 mg/dL (ref 70–99)
Glucose-Capillary: 108 mg/dL — ABNORMAL HIGH (ref 70–99)
Glucose-Capillary: 126 mg/dL — ABNORMAL HIGH (ref 70–99)

## 2011-12-22 LAB — CBC
HCT: 32.9 % — ABNORMAL LOW (ref 36.0–46.0)
Hemoglobin: 11 g/dL — ABNORMAL LOW (ref 12.0–15.0)
MCH: 31.2 pg (ref 26.0–34.0)
MCHC: 33.4 g/dL (ref 30.0–36.0)
Platelets: 328 10*3/uL (ref 150–400)
RDW: 13.9 % (ref 11.5–15.5)

## 2011-12-22 MED ORDER — STROKE: EARLY STAGES OF RECOVERY BOOK
Freq: Once | Status: AC
  Administered 2011-12-22: 03:00:00
  Filled 2011-12-22: qty 1

## 2011-12-22 MED ORDER — GADOBENATE DIMEGLUMINE 529 MG/ML IV SOLN
20.0000 mL | Freq: Once | INTRAVENOUS | Status: AC | PRN
  Administered 2011-12-22: 20 mL via INTRAVENOUS

## 2011-12-22 MED ORDER — METOPROLOL TARTRATE 25 MG PO TABS
25.0000 mg | ORAL_TABLET | Freq: Two times a day (BID) | ORAL | Status: DC
  Administered 2011-12-22 – 2011-12-23 (×4): 25 mg via ORAL
  Filled 2011-12-22 (×6): qty 1

## 2011-12-22 MED ORDER — SODIUM CHLORIDE 0.9 % IV BOLUS (SEPSIS)
1000.0000 mL | Freq: Three times a day (TID) | INTRAVENOUS | Status: AC
  Administered 2011-12-22 (×2): 1000 mL via INTRAVENOUS

## 2011-12-22 NOTE — Progress Notes (Deleted)
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 MRI revealed moderate lt hemispheric infarct. 4/21 she again had hypogylcemia and change in speech pattern. She was moved to NSICU and PCCM asked to assume her care. She is being followed by stroke team. 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 COAG NEG STAPH 4/23 BC x 2>>>  Antibiotics: vanc 4/22>>>  Ceftriaxone 4/22>>>  Tests / Events: -Blood culture is positive for  Gram positive cocci in clusters.  -TEE: 1.7 x 1.7 cm mass attached to the left atrial septum most likely secondary to left atrial myxoma. normal LV function and normal wall motion. -Lab: negative ANA, RF, C3, CA19-9 and sickel cell screen; elevated Crp 4.85  and ESR46.  -CT-abd/Pelvis w/o CM showed  no definitive pancreatic masses identified  -CT-chest:  scarring/interstitial fibrosis in lung. Right upper lobe focal, bronchiectasis. Scattered regions of mild consolidation may represent scarring although difficult to exclude superimposed infection. Cardiomegaly. Ectatic Ascending thoracic aorta measures up to 3.8 cm. 4/25: cardiac cath showed no coronary arterial disease. 4/26: consulted GI for evaluation of possible insulinoma  Vital Signs: Temp:  [98.1 F (36.7 C)-99.1 F (37.3 C)] 98.3 F (36.8 C) (04/28 0400) Pulse Rate:  [65-93] 77  (04/28 0400) Resp:  [18-20] 18  (04/28 0400) BP: (113-121)/(66-75) 121/75 mmHg (04/28 0400) SpO2:  [92 %-96 %] 93 % (04/28 0400) I/O last 3 completed shifts: In: 960 [P.O.:960] Out: -   Physical Examination:  General: WNWD NAD, flat affect  Neuro: alert and oriented to place and person, but not to time, follows commands , improved  strength.slight weakness on the right side. Still has slurry speech. HEENT: No jvd  Cardiovascular: s1 s2 rrr no r  Lungs: cta  Abdomen: +bs obese, NT ND  Musculoskeletal: intact  Skin: warm  Ventilator settings: not on ventilator   Labs and Imaging:  Reviewed.  Please refer to the Assessment and Plan section for relevant results.  ASSESSMENT AND PLAN  NEUROLOGIC A:  New lt stroke, muscle strength improved. TEE did not show vegetation. Positive blood culture P: -per stroke team, improved.  -continue ASA -heparin per neuro -PT/OT  Left atrial Myxoma:  A: TEE showed 1.7 x 1.7 cm mass attached to the left atrial septum most likely secondary to left atrial myxoma. P:  - Appreciate CVTS guidance  -possible surgery on Tue 4/30 -keep tele  PULMONARY A:  [place] currently. Lung is clear to ausculation. O2 Sat is good. CT- chest 4/24 showed scarring/interstitial fibrosis in lung. Right upper lobe focal, bronchiectasis. Scattered regions of mild consolidation may represent scarring although difficult to exclude superimposed infection.  P: - observe. Mobilize - NEEDS REPEAT CT CHEST END MAY/EARLY MAY 2013 with or without OPD PULMONARY FOLLOWUP  CARDIOVASCULAR  Lab 12/16/11 0311 12/15/11 2005 12/15/11 1252 12/15/11 0900  TROPONINI 0.98* 0.65* 1.41* 1.48*  LATICACIDVEN -- -- -- --  PROBNP -- -- -- 441.6*   A:  No chest pain, but with elevated troponin. Cath reviewed P: -appreciate cardiolog's help in managing our patient -continue ASA cath reviewed, will alter CVTS approach as no CAD Keep tele  RENAL  Lab 12/21/11 0605 12/20/11 1818 12/20/11 0540 12/19/11 0500 12/18/11 0700  12/18/11 0303  NA -- 137 134* 137 135 137  K -- 3.8 3.4* -- -- --  CL -- 101 100 100 97 99  CO2 -- 26 27 26 28 28   BUN -- 18 13 10 9 9   CREATININE 0.69 0.78 0.58 0.65 0.69 --  CALCIUM -- 9.3 9.4 9.3 9.1 8.9  MG -- -- -- 1.9 -- --  PHOS -- -- -- 3.9 -- --   A:  No issue.  P: -pending BMP -  d5 ns for hypoglcyemia   GASTROINTESTINAL  Lab 12/20/11 1818 12/18/11 0303 12/17/11 0300 12/15/11 0900  AST 32 25 24 33  ALT 30 21 19 25   ALKPHOS 54 54 50 54  BILITOT 0.2* 0.4 0.3 0.4  PROT 6.5 6.1 5.6* 6.3  ALBUMIN 2.4* 2.5* 2.3* 2.7*   A:  No significant issue. Slightly decreased albumin. P: -->lft in future Tolerating heart healthy diet  HEMATOLOGIC  Lab 12/21/11 0605 12/20/11 1818 12/20/11 0540 12/18/11 0303 12/17/11 1906 12/17/11 0300 12/16/11 0311  HGB 10.9* -- 11.5* 11.9* -- 11.4* 11.2*  HCT 33.0* -- 34.3* 36.8 -- 35.3* 34.7*  PLT 291 -- 272 242 -- 240 261  INR -- 1.05 -- -- 1.04 -- --  APTT -- 26 -- -- -- -- --   A:  Anemia unclear etiology. Normal Vb12 and ferritin. Likely due to chronic disease. Stable Hgb.  P: -->  Levenox, likely beneficial in setting tumor  INFECTIOUS  Lab 12/21/11 0605 12/20/11 0540 12/19/11 0500 12/18/11 0303 12/17/11 0300 12/16/11 0311  WBC 7.6 6.7 -- 8.8 9.2 8.9  PROCALCITON -- -- <0.10 -- -- --   A:  positive blood culture for gram positive cocci in cluster, likely staph. Unclear source, possibly from infected myxoma. TEE no vegetation. Repeated Blood culture on 4/23 negative so far. P: -->  Continue vancomycin and ceftriaxone --> BC clusters may be contaminant, but had persistent hypoglcyemia,  -->pending CBC, if repeat BC in AM neg, would dc vanc - PCT neg , consider algorithm to dc ceftriaxone as well over next 3 days - Prim[facility] service to decide on abx  ENDOCRINE  Lab 12/22/11 0358 12/22/11 0212 12/21/11 2349 12/21/11 2218 12/21/11 1953  GLUCAP 112* 123* 122* 143* 109*   A:  Hypoglycemia. Unclear etiology.  Differential diagnosis includes insulinoma, big IGF-II syndrome, adrenal insufficiency, overdose of DM medications ( on none). Elevated C-peptide. Pending proinsulin and insulin and sulfonylurea hypoglycemia panel.  Cortisol level normal. CT-abd/Pelvis w/o CM showed  no definitive pancreatic masses identified but could not give  contrast then. CBG has been imrpoved  P: - Appreciated GI's help in managing our patient, MRI-abd to r/o neuroendocrine tumor per GI.  -continue IV dextrose to d5 NS  --CBG checks 4h -- consider d/c hydrocortisone cortisol given normal cortisol level.  BEST PRACTICE / DISPOSITION  Best practices / Disposition:  --> Tele status under PCCM and teaching service -->full code  -->lovenox for DVT Px  -->Protonix not indicated  -->diet  -Update pt and family   Lorretta Harp, MD PGY1, Internal Medicine Teaching Service Pager: [phone]

## 2011-12-22 NOTE — Progress Notes (Signed)
History: Courtney Faulkner is an 61 y.o. female with dysarthria, aphasia and right sided weakness. According to son at bedside, his father heard her vaccuming at ~08:30 on 12/14/2011. He came down stairs at 11:30 and found her confused and dysarthric. EMS called and found BS 36. Patient was subsequently brought to the ED. She arrive at 1214. In the ED patient was given some orange juice and her blood CBG went to 166. Patient subsequently had a head CT done which was negative. Initially a code stroke was called however it was canceled secondary to patient's hypoglycemic spells. Per family patient was recently been treated for upper respiratory symptoms with prednisone and albuterol. Patient and family deny any recent fevers, no chills, no chest pain, no diarrhea, no constipation, no dysuria, no generalized weakness. Patient's family does endorse some shortness of breath on exertion when patient climbs up stairs which has been ongoing for the past 2 weeks. On exam and during the interview patient has some dysarthric speech is alert to self only and has an incoherent speech as well. After treatment, CBG 166, pt remains confused, and having difficulty following commands. Neuro consult called by Dr. Janee Morn for this reason. Patient was not a TPA candidate secondary to presenting beyond the tPA window. She was transferred to the ICU after admission for hypoglycemia and neuro decline for further evaluation and treatment.   LSN: [id number] 12/14/11 tPA Given: no; outside window Level of Consciousness (1a. ): Alert, keenly responsive (04/27 2000) LOC Questions (1b. ): Answers neither question correctly (04/27 2000) LOC Commands (1c. ): Performs both tasks correctly (04/27 2000) Best Gaze (2. ): Normal (04/27 2000) Visual (3. ): No visual loss (04/27 2000) Facial Palsy (4. ): Normal symmetrical movements (04/27 2000) Motor Arm, Left (5a. ): No drift (04/27 2000) Motor Arm, Right (5b. ): Drift (04/27 2000) Motor Leg,  Left (6a. ): No drift (04/27 2000) Motor Leg, Right (6b. ): Drift (04/27 2000) Limb Ataxia (7. ): Present in one limb (04/27 2000) Sensory (8. ): Normal, no sensory loss (04/27 2000) Best Language (9. ): Mild-to-moderate aphasia (04/27 2000) Dysarthria (10. ): Normal (04/27 2000) Inattention/Extinction: No Abnormality (04/27 2000) Total: 6  (04/27 2000)  Subjective: Doing ok.  Frustrated with communication difficulties.     Objective: BP 121/75  Pulse 77  Temp(Src) 98.3 F (36.8 C) (Oral)  Resp 18  Ht 5\' 3"  (1.6 m)  Wt 91.4 kg (201 lb 8 oz)  BMI 35.69 kg/m2  SpO2 93% Telemetry: SR  CBGs  Basename 12/22/11 0751 12/22/11 0558 12/22/11 0358 12/22/11 0212 12/21/11 2349 12/21/11 2218 12/21/11 1953 12/21/11 1650 12/21/11 1234 12/21/11 1023  GLUCAP 96 103* 112* 123* 122* 143* 109* 160* 117* 112*   Diet: General thin liquids  Activity: Bedrest, Bathroom privileges  DVT Prophylaxis: Lovenox 40 mg sq daily   Medications: Scheduled:    .  stroke: mapping our early stages of recovery book   Does not apply Once  . amiodarone  200 mg Oral BID PC  . antiseptic oral rinse  15 mL Mouth Rinse BID  . aspirin EC  81 mg Oral Daily  . bisacodyl  5 mg Oral Once  . cefTRIAXone (ROCEPHIN)  IV  2 g Intravenous Q24H  . chlorhexidine  60 mL Topical Once  . chlorhexidine  60 mL Topical Once  . chlorhexidine  60 mL Topical Once  . enoxaparin  40 mg Subcutaneous Q24H  . hydrocortisone sodium succinate  50 mg Intravenous Q6H  . metoprolol  tartrate  12.5 mg Oral Once  . vancomycin  1,000 mg Intravenous Q8H    Neurologic Exam: Mental Status: Alert, oriented, thought content appropriate.  Speech occasionally hesitant fluent.  Some difficulty following commands when asked to move right or left side specifically. Otherwise, able to follow 3 step commands without difficulty. Cranial Nerves: II- Visual fields grossly intact. III/IV/VI-Extraocular movements intact.  Pupils reactive  bilaterally. V/VII-Smile mildly asymmetric right facial droop.   VIII-hearing grossly intact IX/X-normal gag XI-bilateral shoulder shrug XII-midline tongue extension Motor:4/5 RUE, R grip weaker than left. LUE, LLE, RLE grossly 5/5 Sensory: Light touch subjectively diminished on the right hemibody  Deep Tendon Reflexes: slightly hyperreflexic on the right, 2+ left Cerebellar: Coordination is slow but accurate  Lab Results: Basic Metabolic Panel:  Lab 12/22/11 2130 12/21/11 0605 12/20/11 1818 12/19/11 0500  NA 137 -- 137 --  K 3.7 -- 3.8 --  CL 103 -- 101 --  CO2 24 -- 26 --  GLUCOSE 104* -- 119* --  BUN 18 -- 18 --  CREATININE 0.65 0.69 -- --  CALCIUM 9.1 -- 9.3 --  MG -- -- -- 1.9  PHOS -- -- -- 3.9   Liver Function Tests:  Lab 12/20/11 1818 12/18/11 0303  AST 32 25  ALT 30 21  ALKPHOS 54 54  BILITOT 0.2* 0.4  PROT 6.5 6.1  ALBUMIN 2.4* 2.5*   CBC:  Lab 12/22/11 0600 12/21/11 0605 12/18/11 0303 12/17/11 0300  WBC 9.0 7.6 -- --  NEUTROABS -- -- 5.7 5.7  HGB 11.0* 10.9* -- --  HCT 32.9* 33.0* -- --  MCV 93.2 94.0 -- --  PLT 328 291 -- --   Cardiac Enzymes:  Lab 12/16/11 0311 12/15/11 2005 12/15/11 1252  CKTOTAL 349* 417* 407*  CKMB 7.9* 10.6* 11.0*  CKMBINDEX -- -- --  TROPONINI 0.98* 0.65* 1.41*   CBG:  Lab 12/22/11 0751 12/22/11 0558 12/22/11 0358 12/22/11 0212 12/21/11 2349 12/21/11 2218  GLUCAP 96 103* 112* 123* 122* 143*   Hemoglobin A1C:  Lab 12/20/11 1818  HGBA1C 5.1   Fasting Lipid Panel:  Lab 12/16/11 0311  CHOL 134  HDL 45  LDLCALC 71  TRIG 90  CHOLHDL 3.0  LDLDIRECT --   Thyroid Function Tests:  Lab 12/16/11 1103  TSH 3.588  T4TOTAL --  FREET4 --  T3FREE --  THYROIDAB --   Coagulation:  Lab 12/20/11 1818 12/17/11 1906  LABPROT 13.9 13.8  INR 1.05 1.04    Study Results:  CT of the brain  12/14/2011 Normal CT of the head without contrast.   12/15/2011 Developing cytotoxic edema correlates with the observed pattern of  restricted diffusion on MR, representing acute infarction. This primarily involves the left basal ganglia. Smaller areas of cortical and subcortical infarction seen on MRI in the left hemisphere are too small to be easily visible on CT. No incipient uncal or transtentorial herniation, although minimal 1 mm left to right shift may be present. No hemorrhagic transformation.   CT Angio Head  12/16/2011 Acute non hemorrhagic infarct left basal ganglia, left uncus and left corona radiata. 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 Collateral flow to the left anterior cerebral artery and left middle cerebral artery branches. The flow within the left middle cerebral artery branches is diminished suggesting that collateral flow is not adequate.   CT Angio Neck  12/16/2011 Abnormal appearance of the left internal carotid artery with appearance of dissection with complete occlusion at the C2 level. Abnormal appearance of the proximal  left common carotid artery. Question if this is related to artifact versus dissection versus atherosclerotic type changes. Mild narrowing proximal right common carotid artery. Prominent ectasia of the proximal right common carotid artery with fold and mild narrowing. Mild irregularity at the right carotid bifurcation without measurable stenosis. Mild to moderate narrowing proximal right vertebral artery. Mild irregularity throughout the course of the right vertebral artery. Left vertebral artery arises directly from the aortic arch. Mild narrowing proximal left vertebral artery. Evaluation of portions of the proximal left vertebral artery is limited by artifact. Nonspecific parenchymal changes lung apices greater on the right. Findings can be evaluated with a formal chest CT.   MRI of the brain  12/14/2011 Moderate sized acute left hemisphere infarct which appears to be secondary to an acute left internal carotid artery occlusion. No visible hemorrhage or midline shift. Emboli to the left  middle cerebral artery and its branches is not excluded.   2D Echocardiogram  EF 60-65% with no source of embolus. Left atrial mass attached to intra-atrial septum highly suggestive of myxoma.  Carotid Doppler Right: no ICA stenosis. Left: ICA appears occluded. Bilateral vertebral artery flow is antegrade.   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   CXR  12/18/2011 Some increase in airspace disease likely due to progressive edema.   12/16/2011 Appearance of chest is similar to the recent prior study, again suggesting a background of mild interstitial pulmonary edema. Overall, aeration has slightly improved, with decreasing small bilateral pleural effusions.   12/14/2011 Cardiomegaly and bilateral airspace disease most consistent with pulmonary edema.   CT chest  12/17/2011 Respiratory motion degraded examination with poor inspiration. Scattered pulmonary parenchymal changes some which appears to represent scarring/interstitial fibrosis. Right upper lobe focal bronchiectasis. Scattered regions of mild consolidation may represent scarring although difficult to exclude superimposed infection   CT Abdomen and Pelvis  12/17/2011 On this unenhanced examination, no definitive pancreatic masses identified however, evaluation without contrast is markedly limited. For detection of a neuroendocrine tumor, preferable examination is contrast enhanced MR. If the patient cannot hold her breath, CT with contrast with pancreatic protocol can be obtained. Very small amount of free fluid. Etiology indeterminate.   Cardiac Cath 12/18/2011 1. Normal coronary arteries. 2. Normal abdominal aorta, iliac and femoral arteries. 3. Left atrial myxoma identified by neovascularization during right coronary angiography. 4. Left ventriculography was not performed.  12/22/11 Pre-op Cardiac Surgery  Upper Extremity  Right  Left   Brachial Pressures  133 T  129T    Radial Waveforms  T  T   Ulnar Waveforms  T  M   Palmar Arch (Allen's Test)  Doppler waveforms decrease >50% with ulnar and remain normal with radial compressions.  Doppler waveforms obliterate with radial and remain normal with ulnar compressions.     Therapies: PT: R side weakness, expressive difficulty and difficulty following commands. Rec CIR consult.  SLP: Verbal expression marked by neologisms, semantic paraphasias and emerging emergent awareness of errors, however unable to self-correct. Marked language and cognitive improvement with increased sustained attention (15 minutes), improved basic functional problem solving as well as better able to follow 1 step directions (no cues).   Assessment/Plan:  Courtney Faulkner is a 61 y.o. 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 Neuro improvement since admission. On no antiplatelets prior to admission. Now on aspirin 325 mg orally every day  for secondary stroke prevention. Patient with resultant right hemiparesis and expressive aphasia with improvement. Ok for IV heparin from neuro standpoint. Patient is at risk for additional neuro events.  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 Cath clear for OR.  -No sign of endocarditis on TEE, on Vanc and rocephin started 12/16/2011  -left ICA occlusion suspect secondary to myxoma emboli vs dissection  -tachycardia  -hypoglycemia, recurrent, ? Etiology, Insulinoma not confirmed on CT. Workup underway  -Nonspecific parenchymal changes lung apices greater on the right. With pulmonary edema improving today.  -elevated cardiac enzymes, cardiac related.  -short PR  -obesity, class 2, Body mass index is 35.69 kg/(m^2).   Hospital day # 7  TREATMENT/PLAN  -surgery to remove myxoma. Agree with plans for next Tues  -ok for IV heparin during surgery from neuro standpoint   -ok to mobilize  out of bed .  Patient seen and examined by Dr. Pearlean Brownie who agrees with above.  LOS: 8 days   Marya Fossa PA-C [facility] [phone] 12/22/2011  10:39 AM

## 2011-12-22 NOTE — Progress Notes (Signed)
VASCULAR LAB PRELIMINARY  PRELIMINARY  PRELIMINARY  PRELIMINARY  Pre-op Cardiac Surgery    Upper Extremity Right Left  Brachial Pressures 133 T 129T  Radial Waveforms T T  Ulnar Waveforms T M  Palmar Arch (Allen's Test) Doppler waveforms decrease >50% with ulnar and remain normal with radial compressions. Doppler waveforms obliterate with radial and remain normal with ulnar compressions.     Smiley Houseman, 12/22/2011, 8:48 AM

## 2011-12-22 NOTE — Progress Notes (Signed)
Subjective:  Doing well, no complaints, no shortness of breath, no new strokelike symptoms  Objective:  Vital Signs in the last 24 hours: Temp:  [98.3 F (36.8 C)-99.1 F (37.3 C)] 98.3 F (36.8 C) (04/28 0400) Pulse Rate:  [65-77] 77  (04/28 0400) Resp:  [18-20] 18  (04/28 0400) BP: (113-121)/(72-75) 121/75 mmHg (04/28 0400) SpO2:  [93 %-96 %] 93 % (04/28 0400)  Intake/Output from previous day: 04/27 0701 - 04/28 0700 In: 240 [P.O.:240] Out: -    Physical Exam: General: Well developed, well nourished, in no acute distress. Head:  Normocephalic and atraumatic. Lungs: Clear to auscultation and percussion. Heart: Normal S1 and S2.  No murmur, rubs or gallops.  Pulses: Pulses normal in all 4 extremities. Abdomen: soft, non-tender, positive bowel sounds. Extremities: No clubbing or cyanosis. No edema. Neurologic: Alert and oriented x 3.    Lab Results:  Basename 12/22/11 0600 12/21/11 0605  WBC 9.0 7.6  HGB 11.0* 10.9*  PLT 328 291    Basename 12/22/11 0600 12/21/11 0605 12/20/11 1818  NA 137 -- 137  K 3.7 -- 3.8  CL 103 -- 101  CO2 24 -- 26  GLUCOSE 104* -- 119*  BUN 18 -- 18  CREATININE 0.65 0.69 --   No results found for this basename: TROPONINI:2,CK,MB:2 in the last 72 hours Hepatic Function Panel  Basename 12/20/11 1818  PROT 6.5  ALBUMIN 2.4*  AST 32  ALT 30  ALKPHOS 54  BILITOT 0.2*  BILIDIR --  IBILI --   No results found for this basename: CHOL in the last 72 hours No results found for this basename: PROTIME in the last 72 hours  Imaging: Dg Chest 2 View  12/20/2011  *RADIOLOGY REPORT*  Clinical Data: Follow up edema  CHEST - 2 VIEW  Comparison: 12/18/2011  Findings: Mild cardiac enlargement.  Lung volumes are low.  There is atelectasis in both lung bases.  Interval improvement and interstitial edema pattern.  IMPRESSION:  1.  Improvement in pulmonary edema. 2.  Cardiac enlargement, low lung volumes and bibasilar atelectasis.  Original Report  Authenticated By: Rosealee Albee, M.D.   Personally viewed.   Telemetry: Occasional bursts of supraventricular tachycardia heart rate 121 at one instance, currently sinus rhythm. Personally viewed.   Cardiac Studies:  Left atrial mass, no coronary artery disease  Assessment/Plan:  Principal Problem:  *CVA (cerebral infarction) Active Problems:  Hypoglycemia  Anemia  Atrial myxoma  Supraventricular tachycardia-she is currently being loaded with low-dose amiodarone 200 mg twice a day. This is prophylaxis for surgery. She is at high risk for development of atrial fibrillation. I will increase her metoprolol from 12.5 mg twice a day to 25 mg twice a day. She should be able to tolerate this from a blood pressure standpoint.  Left atrial neoplasm-Tuesday, minimally invasive approach. Dr. Cornelius Moras.  Gram-positive blood culture, gram-positive cocci in cluster-continuing vancomycin and ceftriaxone per critical care team. She had persistent hypoglycemia.  Hypoglycemia-unclear etiology. Continued evaluation per primary team.  Anne Fu, Loraine Leriche 12/22/2011, 10:36 AM

## 2011-12-22 NOTE — Progress Notes (Signed)
Subjective: States she is feeling well today.  Still weak and has some problems with words occasionally but feels its better.  She did have some dizziness on standing when working with PT.  Denies any diaphoresis, shaking, nausea, or vomiting.   Objective: Vital signs in last 24 hours: Filed Vitals:   12/21/11 0800 12/21/11 2000 12/22/11 0001 12/22/11 0400  BP: 120/66 119/72 113/72 121/75  Pulse: 93 66 65 77  Temp: 98.1 F (36.7 C) 99.1 F (37.3 C) 98.8 F (37.1 C) 98.3 F (36.8 C)  TempSrc:      Resp: 18 18 20 18   Height:      Weight:      SpO2: 92% 96% 96% 93%   Weight change:   Intake/Output Summary (Last 24 hours) at 12/22/11 1057 Last data filed at 12/21/11 1700  Gross per 24 hour  Intake    240 ml  Output      0 ml  Net    240 ml   Physical Exam: Vitals reviewed. General: alert, cooperative, and in no apparent distress HEENT: pupils equal round and reactive to light, vision grossly intact, oropharynx clear and non-erythematous, right lower facial droop noted improved from yesterday.  Neck: supple, no lymphadenopathy Lungs: clear to ascultation bilaterally, normal work of respiration, no wheezes, rales, ronchi Heart: regular rate and rhythm, no murmurs, gallops, or rubs Abdomen: soft, non-tender, non-distended, normal bowel sounds  Extremities: no cyanosis, clubbing, or edema Neurologic: alert & oriented X3, right lower facial droop noted, otherwise CN II-VI and VIII-XII intact, strength 4/5 in right shoulder flexion and adduction, 4-5/5 in right elbow flexion/extension, otherwise 5/5 throughout.  Lab Results: Basic Metabolic Panel:  Lab 12/22/11 1610 12/21/11 0605 12/20/11 1818 12/19/11 0500  NA 137 -- 137 --  K 3.7 -- 3.8 --  CL 103 -- 101 --  CO2 24 -- 26 --  GLUCOSE 104* -- 119* --  BUN 18 -- 18 --  CREATININE 0.65 0.69 -- --  CALCIUM 9.1 -- 9.3 --  MG -- -- -- 1.9  PHOS -- -- -- 3.9   Liver Function Tests:  Lab 12/20/11 1818 12/18/11 0303  AST 32 25   ALT 30 21  ALKPHOS 54 54  BILITOT 0.2* 0.4  PROT 6.5 6.1  ALBUMIN 2.4* 2.5*   CBC:  Lab 12/22/11 0600 12/21/11 0605 12/18/11 0303 12/17/11 0300  WBC 9.0 7.6 -- --  NEUTROABS -- -- 5.7 5.7  HGB 11.0* 10.9* -- --  HCT 32.9* 33.0* -- --  MCV 93.2 94.0 -- --  PLT 328 291 -- --   Cardiac Enzymes:  Lab 12/16/11 0311 12/15/11 2005 12/15/11 1252  CKTOTAL 349* 417* 407*  CKMB 7.9* 10.6* 11.0*  CKMBINDEX -- -- --  TROPONINI 0.98* 0.65* 1.41*   CBG:  Lab 12/22/11 0751 12/22/11 0558 12/22/11 0358 12/22/11 0212 12/21/11 2349 12/21/11 2218  GLUCAP 96 103* 112* 123* 122* 143*   Hemoglobin A1C:  Lab 12/20/11 1818  HGBA1C 5.1   Fasting Lipid Panel:  Lab 12/16/11 0311  CHOL 134  HDL 45  LDLCALC 71  TRIG 90  CHOLHDL 3.0  LDLDIRECT --   Thyroid Function Tests:  Lab 12/16/11 1103  TSH 3.588  T4TOTAL --  FREET4 --  T3FREE --  THYROIDAB --   Coagulation:  Lab 12/20/11 1818 12/17/11 1906  LABPROT 13.9 13.8  INR 1.05 1.04   Misc. Labs: Parathyroid hormone: Pending  Proinsulin/Insulin Level: Pending  C-Peptide: 4.17 (H)  AM Cortisol: 10.9  Sed Rate:  46  ANA: Neg  Anti-DNA: 1 (Neg)  RF: <10  C3: 118  C4: 51  CRP: 4.85  ANCA: Neg  MPO/PR-3: <1  Micro Results: Recent Results (from the past 240 hour(s))  CULTURE, BLOOD (ROUTINE X 2)     Status: Normal   Collection Time   12/14/11  4:14 PM      Component Value Range Status Comment   Specimen Description BLOOD ARM RIGHT   Final    Special Requests BOTTLES DRAWN AEROBIC AND ANAEROBIC 10CC   Final    Culture  Setup Time [id number]   Final    Culture NO GROWTH 5 DAYS   Final    Report Status 12/20/2011 FINAL   Final   CULTURE, BLOOD (ROUTINE X 2)     Status: Normal   Collection Time   12/14/11  5:45 PM      Component Value Range Status Comment   Specimen Description BLOOD RIGHT ARM   Final    Special Requests BOTTLES DRAWN AEROBIC AND ANAEROBIC 10CC   Final    Culture  Setup Time [id number]   Final     Culture     Final    Value: STAPHYLOCOCCUS SPECIES (COAGULASE NEGATIVE)     Note: THE SIGNIFICANCE OF ISOLATING THIS ORGANISM FROM A SINGLE SET OF BLOOD CULTURES WHEN MULTIPLE SETS ARE DRAWN IS UNCERTAIN. PLEASE NOTIFY THE MICROBIOLOGY DEPARTMENT WITHIN ONE WEEK IF SPECIATION AND SENSITIVITIES ARE REQUIRED.     Note: Gram Stain Report Called to,Read Back By and Verified With: ELAINE THIELEN @1057  12/17/11 BY KRAWS   Report Status 12/19/2011 FINAL   Final   MRSA PCR SCREENING     Status: Normal   Collection Time   12/14/11  5:52 PM      Component Value Range Status Comment   MRSA by PCR NEGATIVE  NEGATIVE  Final   CULTURE, BLOOD (ROUTINE X 2)     Status: Normal (Preliminary result)   Collection Time   12/17/11 12:50 PM      Component Value Range Status Comment   Specimen Description BLOOD LEFT FOREARM   Final    Special Requests     Final    Value: BOTTLES DRAWN AEROBIC AND ANAEROBIC 4CC RED  6CC BLUE   Culture  Setup Time [id number]   Final    Culture     Final    Value:        BLOOD CULTURE RECEIVED NO GROWTH TO DATE CULTURE WILL BE HELD FOR 5 DAYS BEFORE ISSUING A FINAL NEGATIVE REPORT   Report Status PENDING   Incomplete   CULTURE, BLOOD (ROUTINE X 2)     Status: Normal (Preliminary result)   Collection Time   12/17/11  1:00 PM      Component Value Range Status Comment   Specimen Description BLOOD RIGHT ARM   Final    Special Requests BOTTLES DRAWN AEROBIC ONLY 1.5CC   Final    Culture  Setup Time [id number]   Final    Culture     Final    Value:        BLOOD CULTURE RECEIVED NO GROWTH TO DATE CULTURE WILL BE HELD FOR 5 DAYS BEFORE ISSUING A FINAL NEGATIVE REPORT   Report Status PENDING   Incomplete    Studies/Results: Dg Chest 2 View  12/20/2011  *RADIOLOGY REPORT*  Clinical Data: Follow up edema  CHEST - 2 VIEW  Comparison: 12/18/2011  Findings: Mild cardiac enlargement.  Lung volumes are low.  There is atelectasis in both lung bases.  Interval improvement and interstitial  edema pattern.  IMPRESSION:  1.  Improvement in pulmonary edema. 2.  Cardiac enlargement, low lung volumes and bibasilar atelectasis.  Original Report Authenticated By: Rosealee Albee, M.D.   Medications: I have reviewed the patient's current medications. Scheduled Meds:   .  stroke: mapping our early stages of recovery book   Does not apply Once  . amiodarone  200 mg Oral BID PC  . antiseptic oral rinse  15 mL Mouth Rinse BID  . aspirin EC  81 mg Oral Daily  . bisacodyl  5 mg Oral Once  . cefTRIAXone (ROCEPHIN)  IV  2 g Intravenous Q24H  . chlorhexidine  60 mL Topical Once  . chlorhexidine  60 mL Topical Once  . chlorhexidine  60 mL Topical Once  . enoxaparin  40 mg Subcutaneous Q24H  . hydrocortisone sodium succinate  50 mg Intravenous Q6H  . metoprolol tartrate  25 mg Oral BID  . vancomycin  1,000 mg Intravenous Q8H  . DISCONTD: metoprolol tartrate  12.5 mg Oral Once   Continuous Infusions:   . dextrose 5 % and 0.9% NaCl 30 mL/hr at 12/21/11 0234   PRN Meds:.acetaminophen, acetaminophen, dextrose, ondansetron (ZOFRAN) IV, ondansetron (ZOFRAN) IV, temazepam  Assessment/Plan: 1. 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 Plan for CVTS surgery next week, likely Tuesday. Discussed with Neuro and CVTS today. The indications for heparin drip are only during surgery. Heparin for surgery was approved by neuro. Cardiology workup completed with negative Cath.  - appreciate CVTS, cardiology recs  - plan for surgical removal tentatively Tuesday by right chest approach rather than sternotomy per note from Dr. Cornelius Moras. - continue aspirin 81 mg daily (does not need to be discontinued in anticipation of CVTS surg next week, per Dr. Tyrone Sage)  - Cleared for heparin drip during surgery from neuro standpoint  2. Recurrent hypoglycemia - CBG's currently well-controlled on D5-NS, though hypoglycemia noted as recently as 4/24. Elevated C-peptide, proinsulin/insulin ratio  pending. Differential includes insulinoma vs infectious etiology (with myxomas as possible source). Consulted Dr Arlyce Dice from [facility] who is covering for Dr. Elnoria Howard.  He suggested a MRI of the abdomen with contrast over the EUS.  We will plan on that today but will likely have to hold any surgical intervention until after she recovers from her cardiac surgery.   - await proinsulin/insulin results... - MRI of Abd with contrast.  3. Orthostasis:  While working with PT yesterday she noted some lightheadedness on standing.  Blood pressures done at that time did show orthostasis but not diagnostic.  No pulses were recorded.    Orthostatic vital signs Lying: 117/61 with pulse of 71, Sitting: 130/69 pulse of 76, Standing: 132/82 pulse of 111.  We will treat with two 1L NS bolus and repeat her orthostatics in the morning.   4. Acute CVA - likely represents thrombus from atrial myxoma, also considering L ICA dissection (given CT neck results).  -continue aspirin daily, decrease to 81 mg per CVTS recs  -continue PT, OT, SLP, to improve patient's RUE weakness, and R CN VII function. Recommendation for CIR when cleared following cardiac surgery.   5. Positive Blood cultures: 1/2 blood cultures from 4/20 showing coag-neg staph, with blood cultures 4/23 negative to date. Remains Afebrile since admission.  -continue to follow blood cultures  -continue vanc, ceftriaxone for now, likely d/c if second set of blood cultures negative.  6. Prophy - lovenox, ASA 81   LOS: 8 days   Cheney Ewart Internal Medicine Resident Pager: [phone] 12/22/2011 10:57 AM

## 2011-12-22 NOTE — Consult Note (Signed)
Chart was reviewed and patient was examined. X-rays were reviewed.   Before proceeding with EUS she should have a contrast enhanced MRI.  If this is negative I'm not sure EUS will increase the yield of seeing a pancreatic mass.  If positive EUS with biopsy would be indicated. Will discuss with Dr. Mickle Asper D. Arlyce Dice, M.D., [facility]

## 2011-12-22 NOTE — Discharge Instructions (Signed)
STROKE/TIA DISCHARGE INSTRUCTIONS SMOKING Cigarette smoking nearly doubles your risk of having a stroke & is the single most alterable risk factor  If you smoke or have smoked in the last 12 months, you are advised to quit smoking for your health.  Most of the excess cardiovascular risk related to smoking disappears within a year of stopping.  Ask you doctor about anti-smoking medications  [place] Quit Line: 1-800-QUIT NOW  Free Smoking Cessation Classes [phone]  CHOLESTEROL Know your levels; limit fat & cholesterol in your diet  Lipid Panel     Component Value Date/Time   CHOL 134 12/16/2011 0311   TRIG 90 12/16/2011 0311   HDL 45 12/16/2011 0311   CHOLHDL 3.0 12/16/2011 0311   VLDL 18 12/16/2011 0311   LDLCALC 71 12/16/2011 0311      Many patients benefit from treatment even if their cholesterol is at goal.  Goal: Total Cholesterol (CHOL) less than 160  Goal:  Triglycerides (TRIG) less than 150  Goal:  HDL greater than 40  Goal:  LDL (LDLCALC) less than 100   BLOOD PRESSURE American Stroke Association blood pressure target is less that 120/80 mm/Hg  Your discharge blood pressure is:  BP: 109/48 mmHg  Monitor your blood pressure  Limit your salt and alcohol intake  Many individuals will require more than one medication for high blood pressure  DIABETES (A1c is a blood sugar average for last 3 months) Goal HGBA1c is under 7% (HBGA1c is blood sugar average for last 3 months)  Diabetes: {STROKE DC DIABETES:22357}    Lab Results  Component Value Date   HGBA1C 5.1 12/15/2011     Your HGBA1c can be lowered with medications, healthy diet, and exercise.  Check your blood sugar as directed by your physician  Call your physician if you experience unexplained or low blood sugars.  PHYSICAL ACTIVITY/REHABILITATION Goal is 30 minutes at least 4 days per week    {STROKE DC ACTIVITY/REHAB:22359}  Activity decreases your risk of heart attack and stroke and makes your heart  stronger.  It helps control your weight and blood pressure; helps you relax and can improve your mood.  Participate in a regular exercise program.  Talk with your doctor about the best form of exercise for you (dancing, walking, swimming, cycling).  DIET/WEIGHT Goal is to maintain a healthy weight  Your discharge diet is: Cardiac  liquids Your height is:  Height: 5\' 3"  (160 cm) Your current weight is: Weight: 94.6 kg (208 lb 8.9 oz) Your Body Mass Index (BMI) is:  BMI (Calculated): 35.8   Following the type of diet specifically designed for you will help prevent another stroke.  Your goal weight range is:    Your goal Body Mass Index (BMI) is 19-24.  Healthy food habits can help reduce 3 risk factors for stroke:  High cholesterol, hypertension, and excess weight.  RESOURCES Stroke/Support Group:  Call [phone]  they meet the 3rd Sunday of the month on the Rehab Unit at [facility], [place] ( no meetings June, July & Aug).  STROKE EDUCATION PROVIDED/REVIEWED AND GIVEN TO PATIENT Stroke warning signs and symptoms How to activate emergency medical system (call 911). Medications prescribed at discharge. Need for follow-up after discharge. Personal risk factors for stroke. Pneumonia vaccine given:   {STROKE DC YES/NO/DATE:22363} Flu vaccine given:   {STROKE DC YES/NO/DATE:22363} My questions have been answered, the writing is legible, and I understand these instructions.  I will adhere to these goals & educational materials that have  been provided to me after my discharge from the hospital.

## 2011-12-23 ENCOUNTER — Inpatient Hospital Stay (HOSPITAL_COMMUNITY): Payer: BC Managed Care – PPO

## 2011-12-23 DIAGNOSIS — D487 Neoplasm of uncertain behavior of other specified sites: Secondary | ICD-10-CM

## 2011-12-23 LAB — URINALYSIS, ROUTINE W REFLEX MICROSCOPIC
Bilirubin Urine: NEGATIVE
Glucose, UA: NEGATIVE mg/dL
Ketones, ur: NEGATIVE mg/dL
Nitrite: NEGATIVE
Protein, ur: NEGATIVE mg/dL
pH: 6 (ref 5.0–8.0)

## 2011-12-23 LAB — CBC
HCT: 33.1 % — ABNORMAL LOW (ref 36.0–46.0)
MCH: 30.9 pg (ref 26.0–34.0)
MCHC: 32.6 g/dL (ref 30.0–36.0)
Platelets: 342 10*3/uL (ref 150–400)
WBC: 8.5 10*3/uL (ref 4.0–10.5)

## 2011-12-23 LAB — URINE MICROSCOPIC-ADD ON

## 2011-12-23 LAB — GLUCOSE, CAPILLARY
Glucose-Capillary: 107 mg/dL — ABNORMAL HIGH (ref 70–99)
Glucose-Capillary: 132 mg/dL — ABNORMAL HIGH (ref 70–99)

## 2011-12-23 LAB — PROINSULIN/INSULIN RATIO
Insulin: 16 u[IU]/mL (ref 3–19)
Proinsulin/Insulin Ratio: 11.9 %
Proinsulin: 11.4 pmol/L (ref <=26.8)

## 2011-12-23 LAB — TYPE AND SCREEN: ABO/RH(D): A POS

## 2011-12-23 LAB — SURGICAL PCR SCREEN: MRSA, PCR: NEGATIVE

## 2011-12-23 LAB — BASIC METABOLIC PANEL
Calcium: 8.9 mg/dL (ref 8.4–10.5)
GFR calc non Af Amer: >90 mL/min (ref >90)
Sodium: 139 mEq/L (ref 135–145)

## 2011-12-23 LAB — PULMONARY FUNCTION TEST

## 2011-12-23 MED ORDER — SODIUM BICARBONATE 8.4 % IV SOLN
INTRAVENOUS | Status: DC
  Filled 2011-12-23: qty 2.5

## 2011-12-23 MED ORDER — EPINEPHRINE HCL 1 MG/ML IJ SOLN
0.5000 ug/min | INTRAVENOUS | Status: DC
  Filled 2011-12-23: qty 4

## 2011-12-23 MED ORDER — HYDROCORTISONE SOD SUCCINATE 100 MG IJ SOLR
50.0000 mg | Freq: Two times a day (BID) | INTRAMUSCULAR | Status: DC
  Administered 2011-12-24: 50 mg via INTRAVENOUS
  Filled 2011-12-23 (×3): qty 1

## 2011-12-23 MED ORDER — DEXTROSE 5 % IV SOLN
750.0000 mg | INTRAVENOUS | Status: DC
  Filled 2011-12-23: qty 750

## 2011-12-23 MED ORDER — DOPAMINE-DEXTROSE 3.2-5 MG/ML-% IV SOLN
2.0000 ug/kg/min | INTRAVENOUS | Status: DC
  Filled 2011-12-23: qty 250

## 2011-12-23 MED ORDER — POTASSIUM CHLORIDE 2 MEQ/ML IV SOLN
80.0000 meq | INTRAVENOUS | Status: DC
  Filled 2011-12-23: qty 40

## 2011-12-23 MED ORDER — TRANEXAMIC ACID (OHS) BOLUS VIA INFUSION
15.0000 mg/kg | INTRAVENOUS | Status: AC
  Administered 2011-12-24: 1371 mg via INTRAVENOUS
  Filled 2011-12-23: qty 1371

## 2011-12-23 MED ORDER — HYDROCORTISONE SOD SUCCINATE 100 MG IJ SOLR
50.0000 mg | Freq: Three times a day (TID) | INTRAMUSCULAR | Status: AC
  Administered 2011-12-23: 50 mg via INTRAVENOUS
  Filled 2011-12-23: qty 1

## 2011-12-23 MED ORDER — TRANEXAMIC ACID 100 MG/ML IV SOLN
1.5000 mg/kg/h | INTRAVENOUS | Status: AC
  Administered 2011-12-24: 1.5 mg/kg/h via INTRAVENOUS
  Filled 2011-12-23: qty 25

## 2011-12-23 MED ORDER — CIPROFLOXACIN HCL 750 MG PO TABS
750.0000 mg | ORAL_TABLET | Freq: Two times a day (BID) | ORAL | Status: DC
  Administered 2011-12-23: 750 mg via ORAL
  Filled 2011-12-23 (×4): qty 1

## 2011-12-23 MED ORDER — PHENYLEPHRINE HCL 10 MG/ML IJ SOLN
30.0000 ug/min | INTRAVENOUS | Status: DC
  Filled 2011-12-23: qty 2

## 2011-12-23 MED ORDER — SODIUM CHLORIDE 0.9 % IV SOLN
INTRAVENOUS | Status: AC
  Administered 2011-12-24: 1 [IU]/h via INTRAVENOUS
  Filled 2011-12-23: qty 1

## 2011-12-23 MED ORDER — ALBUTEROL SULFATE (5 MG/ML) 0.5% IN NEBU
2.5000 mg | INHALATION_SOLUTION | Freq: Once | RESPIRATORY_TRACT | Status: AC
  Administered 2011-12-23: 2.5 mg via RESPIRATORY_TRACT

## 2011-12-23 MED ORDER — SODIUM CHLORIDE 0.9 % IV SOLN
0.1000 ug/kg/h | INTRAVENOUS | Status: AC
  Administered 2011-12-24: .2 ug/kg/h via INTRAVENOUS
  Filled 2011-12-23: qty 4

## 2011-12-23 MED ORDER — NITROGLYCERIN IN D5W 200-5 MCG/ML-% IV SOLN
2.0000 ug/min | INTRAVENOUS | Status: AC
  Administered 2011-12-24: 10 ug/min via INTRAVENOUS
  Filled 2011-12-23: qty 250

## 2011-12-23 MED ORDER — MAGNESIUM SULFATE 50 % IJ SOLN
40.0000 meq | INTRAMUSCULAR | Status: DC
  Filled 2011-12-23: qty 10

## 2011-12-23 MED ORDER — VANCOMYCIN HCL 1000 MG IV SOLR
1500.0000 mg | INTRAVENOUS | Status: AC
  Administered 2011-12-24: 1500 mg via INTRAVENOUS
  Filled 2011-12-23: qty 1500

## 2011-12-23 MED ORDER — TRANEXAMIC ACID (OHS) PUMP PRIME SOLUTION
2.0000 mg/kg | INTRAVENOUS | Status: DC
  Filled 2011-12-23: qty 1.83

## 2011-12-23 MED ORDER — DEXTROSE 5 % IV SOLN
1.5000 g | INTRAVENOUS | Status: AC
  Administered 2011-12-24: 1.5 g via INTRAVENOUS
  Administered 2011-12-24: .75 g via INTRAVENOUS
  Filled 2011-12-23 (×2): qty 1.5

## 2011-12-23 MED ORDER — POTASSIUM CHLORIDE CRYS ER 20 MEQ PO TBCR
40.0000 meq | EXTENDED_RELEASE_TABLET | Freq: Once | ORAL | Status: AC
  Administered 2011-12-23: 40 meq via ORAL
  Filled 2011-12-23: qty 2

## 2011-12-23 MED ORDER — HEPARIN SODIUM (PORCINE) 5000 UNIT/ML IJ SOLN
5000.0000 [IU] | Freq: Three times a day (TID) | INTRAMUSCULAR | Status: DC
  Filled 2011-12-23 (×2): qty 1

## 2011-12-23 NOTE — Progress Notes (Signed)
Subjective: The patient notes some urinary hesitancy for the last 1-2 days, noting that she feels like she needs to urinate, but has difficulty initiating a stream.  However, she notes no suprapubic tenderness, and notes that she is still able to urinate on her own, just less frequently.  She notes improvement in word-finding difficulty.  The patient notes less lightheadedness after 2L NS bolus yesterday.  The patient's IV has expired, and IV team has had difficulty placing a new IV.  Objective: Vital signs in last 24 hours: Filed Vitals:   12/23/11 0001 12/23/11 0600 12/23/11 0603 12/23/11 0659  BP: 106/68 119/73 119/74 145/89  Pulse: 56 59 67 67  Temp: 99.1 F (37.3 C) 98.6 F (37 C)    TempSrc:      Resp: 16 16    Height:      Weight:      SpO2: 93% 95%     Weight change:   Intake/Output Summary (Last 24 hours) at 12/23/11 0850 Last data filed at 12/22/11 1710  Gross per 24 hour  Intake   2250 ml  Output      0 ml  Net   2250 ml   Physical Exam: General: alert, cooperative, and in no apparent distress HEENT: pupils equal round and reactive to light, vision grossly intact, oropharynx clear and non-erythematous, right lower facial droop noted. Neck: supple, no lymphadenopathy Lungs: clear to ascultation bilaterally, normal work of respiration, no wheezes, rales, ronchi Heart: regular rate and rhythm, no murmurs, gallops, or rubs Abdomen: soft, non-tender, non-distended, normal bowel sounds Extremities: no cyanosis, clubbing, or edema Neurologic: alert & oriented X3, right lower facial droop noted, otherwise CN II-VI and VIII-XII intact, strength 4/5 in right shoulder flexion and adduction, 4-5/5 in right elbow flexion/extension, otherwise 5/5 throughout.  Expressive aphasia noted  Lab Results: Basic Metabolic Panel:  Lab 12/23/11 4132 12/22/11 0600 12/19/11 0500  NA 139 137 --  K 3.3* 3.7 --  CL 107 103 --  CO2 24 24 --  GLUCOSE 114* 104* --  BUN 16 18 --  CREATININE  0.63 0.65 --  CALCIUM 8.9 9.1 --  MG -- -- 1.9  PHOS -- -- 3.9   Liver Function Tests:  Lab 12/20/11 1818 12/18/11 0303  AST 32 25  ALT 30 21  ALKPHOS 54 54  BILITOT 0.2* 0.4  PROT 6.5 6.1  ALBUMIN 2.4* 2.5*   CBC:  Lab 12/23/11 0615 12/22/11 0600 12/18/11 0303 12/17/11 0300  WBC 8.5 9.0 -- --  NEUTROABS -- -- 5.7 5.7  HGB 10.8* 11.0* -- --  HCT 33.1* 32.9* -- --  MCV 94.6 93.2 -- --  PLT 342 328 -- --   CBG:  Lab 12/23/11 0735 12/23/11 0539 12/23/11 0002 12/22/11 2007 12/22/11 1631 12/22/11 1204  GLUCAP 95 132* 107* 126* 95 108*   Hemoglobin A1C:  Lab 12/20/11 1818  HGBA1C 5.1   Thyroid Function Tests:  Lab 12/16/11 1103  TSH 3.588  T4TOTAL --  FREET4 --  T3FREE --  THYROIDAB --   Coagulation:  Lab 12/20/11 1818 12/17/11 1906  LABPROT 13.9 13.8  INR 1.05 1.04   Misc. Labs:  Parathyroid hormone: Pending  Proinsulin/Insulin Level: Pending  C-Peptide: 4.17 (H)  AM Cortisol: 10.9  Sed Rate: 46  ANA: Neg  Anti-DNA: 1 (Neg)  RF: <10  C3: 118  C4: 51  CRP: 4.85  ANCA: Neg  MPO/PR-3: <1   Micro Results: Recent Results (from the past 240 hour(s))  CULTURE, BLOOD (  ROUTINE X 2)     Status: Normal   Collection Time   12/14/11  4:14 PM      Component Value Range Status Comment   Specimen Description BLOOD ARM RIGHT   Final    Special Requests BOTTLES DRAWN AEROBIC AND ANAEROBIC 10CC   Final    Culture  Setup Time [id number]   Final    Culture NO GROWTH 5 DAYS   Final    Report Status 12/20/2011 FINAL   Final   CULTURE, BLOOD (ROUTINE X 2)     Status: Normal   Collection Time   12/14/11  5:45 PM      Component Value Range Status Comment   Specimen Description BLOOD RIGHT ARM   Final    Special Requests BOTTLES DRAWN AEROBIC AND ANAEROBIC 10CC   Final    Culture  Setup Time [id number]   Final    Culture     Final    Value: STAPHYLOCOCCUS SPECIES (COAGULASE NEGATIVE)     Note: THE SIGNIFICANCE OF ISOLATING THIS ORGANISM FROM A SINGLE SET OF  BLOOD CULTURES WHEN MULTIPLE SETS ARE DRAWN IS UNCERTAIN. PLEASE NOTIFY THE MICROBIOLOGY DEPARTMENT WITHIN ONE WEEK IF SPECIATION AND SENSITIVITIES ARE REQUIRED.     Note: Gram Stain Report Called to,Read Back By and Verified With: ELAINE THIELEN @1057  12/17/11 BY KRAWS   Report Status 12/19/2011 FINAL   Final   MRSA PCR SCREENING     Status: Normal   Collection Time   12/14/11  5:52 PM      Component Value Range Status Comment   MRSA by PCR NEGATIVE  NEGATIVE  Final   CULTURE, BLOOD (ROUTINE X 2)     Status: Normal (Preliminary result)   Collection Time   12/17/11 12:50 PM      Component Value Range Status Comment   Specimen Description BLOOD LEFT FOREARM   Final    Special Requests     Final    Value: BOTTLES DRAWN AEROBIC AND ANAEROBIC 4CC RED  6CC BLUE   Culture  Setup Time [id number]   Final    Culture     Final    Value:        BLOOD CULTURE RECEIVED NO GROWTH TO DATE CULTURE WILL BE HELD FOR 5 DAYS BEFORE ISSUING A FINAL NEGATIVE REPORT   Report Status PENDING   Incomplete   CULTURE, BLOOD (ROUTINE X 2)     Status: Normal (Preliminary result)   Collection Time   12/17/11  1:00 PM      Component Value Range Status Comment   Specimen Description BLOOD RIGHT ARM   Final    Special Requests BOTTLES DRAWN AEROBIC ONLY 1.5CC   Final    Culture  Setup Time [id number]   Final    Culture     Final    Value:        BLOOD CULTURE RECEIVED NO GROWTH TO DATE CULTURE WILL BE HELD FOR 5 DAYS BEFORE ISSUING A FINAL NEGATIVE REPORT   Report Status PENDING   Incomplete    Studies/Results: Mr Abdomen W Wo Contrast  12/22/2011  *RADIOLOGY REPORT*  Clinical Data: Hypoglycemia. Evaluate for insulinoma.  MRI ABDOMEN WITH AND WITHOUT CONTRAST  Technique:  Multiplanar multisequence MR imaging of the abdomen was performed both before and after administration of intravenous contrast.  Contrast: 20mL MULTIHANCE GADOBENATE DIMEGLUMINE 529 MG/ML IV SOLN  Comparison: None.  Findings:  Diminished exam detail  due to motion artifact.  Pancreas appears normal.  No mass identified.  The spleen is within normal limits.  Within the right hepatic lobe there is a focal area of mild T2 hyperintensity which measures 2 cm.  This area was not covered on the T1-weighted images and the images performed with contrast material.  The remaining visualized portions of the liver appear within normal limits.  The gallbladder appears normal.  There is no biliary dilatation.  Small T2 hyperintensity within the mid pole of the left kidney measures 8 mm.  This is difficult to characterize on the postcontrast images.  No obvious enhancement is noted.  No upper abdominal adenopathy.  There is no upper abdominal ascites.  The visualized bony structures appear normal.  Large lipoma is identified within the right lateral abdominal wall.  IMPRESSION:  1.  Diminished exam detail due to respiratory motion artifact. 2.  No pancreatic mass identified. 3.  Indeterminant T2 intense structure within the right hepatic lobe.  Not imaged on the T1-weighted images were the postcontrast images.  Because of the patient's difficulty with breath-hold I would advise a three-phase liver imaging with contrast enhanced CT of the abdomen for more definitive assessment.  Original Report Authenticated By: Rosealee Albee, M.D.   Medications: I have reviewed the patient's current medications. Scheduled Meds:    . amiodarone  200 mg Oral BID PC  . antiseptic oral rinse  15 mL Mouth Rinse BID  . aspirin EC  81 mg Oral Daily  . bisacodyl  5 mg Oral Once  . cefTRIAXone (ROCEPHIN)  IV  2 g Intravenous Q24H  . chlorhexidine  60 mL Topical Once  . chlorhexidine  60 mL Topical Once  . chlorhexidine  60 mL Topical Once  . enoxaparin  40 mg Subcutaneous Q24H  . hydrocortisone sodium succinate  50 mg Intravenous Q6H  . metoprolol tartrate  25 mg Oral BID  . potassium chloride  40 mEq Oral Once  . sodium chloride  1,000 mL Intravenous Q8H  . vancomycin  1,000 mg  Intravenous Q8H  . DISCONTD: metoprolol tartrate  12.5 mg Oral Once   Continuous Infusions:    . dextrose 5 % and 0.9% NaCl 30 mL/hr at 12/21/11 0234   PRN Meds:.acetaminophen, acetaminophen, dextrose, gadobenate dimeglumine, ondansetron (ZOFRAN) IV, ondansetron (ZOFRAN) IV, temazepam  Assessment/Plan: The patient is a 61 yo woman, no prior significant medical history, presenting with weakness and dysphagia, found to have an acute CVA, likely secondary to atrial myxoma.  # Left atrial myxoma - plan for CVTS surgery tomorrow -appreciate CVTS, cardiology recs -plan for surgical removal tentatively tomorrow, likely R chest approach rather than sternotomy -continue aspirin 81 mg daily (does not need to be discontinued in anticipation of CVTS surg next week, per Dr. Tyrone Sage) -plan for peri-operative heparin drip, per CVTS -PICC for IV access  # Recurrent hypoglycemia - CBG's currently well-controlled on D5-NS, no hypoglycemia since 4/24.  Elevated C-peptide, proinsulin/insulin ratio pending.  Differential includes insulinoma vs infectious etiology (with myxoma as possible source). -await proinsulin/insulin results -MRI of poor quality secondary to motion -appreciate GI consult, consider EUS for possible insulinoma  # Acute CVA - likely represents thrombus from atrial myxoma, also considering L ICA dissection (given CT neck results). -continue aspirin 81 daily -perioperative heparin drip -continue PT, OT, SLP, to improve patient's RUE weakness, and R CN VII function  # ?Bacteremia - 1/2 blood cultures from 4/20 showing coag-neg staph, with blood cultures 4/23 negative to date. -if blood cultures negative today, will discontinue vanc/ceftriaxone  #  Prophy - heparin   LOS: 9 days   Courtney Faulkner 12/23/2011, 8:50 AM

## 2011-12-23 NOTE — Progress Notes (Signed)
Patient Name: Courtney Faulkner Date of Encounter: 12/23/2011    SUBJECTIVE: She is awake and team is trying to start an IV. She has mild dyspnea. She denies chest pain and has no new neuro complaints.  TELEMETRY:  NSR: Filed Vitals:   12/23/11 0603 12/23/11 0659 12/23/11 0800 12/23/11 0920  BP: 119/74 145/89 107/66 106/70  Pulse: 67 67 76 74  Temp:   98.6 F (37 C)   TempSrc:      Resp:   17   Height:      Weight:      SpO2:   94%     Intake/Output Summary (Last 24 hours) at 12/23/11 1053 Last data filed at 12/23/11 0800  Gross per 24 hour  Intake   2490 ml  Output      0 ml  Net   2490 ml    LABS: Basic Metabolic Panel:  Basename 12/23/11 0615 12/22/11 0600  NA 139 137  K 3.3* 3.7  CL 107 103  CO2 24 24  GLUCOSE 114* 104*  BUN 16 18  CREATININE 0.63 0.65  CALCIUM 8.9 9.1  MG -- --  PHOS -- --   CBC:  Basename 12/23/11 0615 12/22/11 0600  WBC 8.5 9.0  NEUTROABS -- --  HGB 10.8* 11.0*  HCT 33.1* 32.9*  MCV 94.6 93.2  PLT 342 328  Hemoglobin A1C:  Basename 12/20/11 1818  HGBA1C 5.1    Radiology/Studies:   no new  Physical Exam: Blood pressure 106/70, pulse 74, temperature 98.6 F (37 C), temperature source Oral, resp. rate 17, height 5\' 3"  (1.6 m), weight 91.4 kg (201 lb 8 oz), SpO2 94.00%. Weight change:    No gallop and no murmur.  Neuro with improved right sided strength.  ASSESSMENT:  1. 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 LA Myxoma, stable  3. Hypokalemia  PLAN:  1. PICC line  2. Replete potassium.  Selinda Eon 12/23/2011, 10:53 AM

## 2011-12-23 NOTE — Progress Notes (Addendum)
Patient ID: Courtney Faulkner, female   DOB: 03-14-1951, 61 y.o.   MRN: [id number] Subjective: No acute events.    Objective: Vital signs in last 24 hours: Temp:  [98.6 F (37 C)-99.1 F (37.3 C)] 98.7 F (37.1 C) (04/29 1200) Pulse Rate:  [56-76] 69  (04/29 1200) Resp:  [16-18] 17  (04/29 1200) BP: (100-145)/(59-89) 106/70 mmHg (04/29 0920) SpO2:  [93 %-98 %] 98 % (04/29 1200) Last BM Date: 12/20/11  Intake/Output from previous day: 04/28 0701 - 04/29 0700 In: 2250 [IV Piggyback:2250] Out: -  Intake/Output this shift: Total I/O In: 240 [P.O.:240] Out: -   General appearance: alert and no distress GI: soft, non-tender; bowel sounds normal; no masses,  no organomegaly  Lab Results:  Basename 12/23/11 0615 12/22/11 0600 12/21/11 0605  WBC 8.5 9.0 7.6  HGB 10.8* 11.0* 10.9*  HCT 33.1* 32.9* 33.0*  PLT 342 328 291   BMET  Basename 12/23/11 0615 12/22/11 0600 12/21/11 0605 12/20/11 1818  NA 139 137 -- 137  K 3.3* 3.7 -- 3.8  CL 107 103 -- 101  CO2 24 24 -- 26  GLUCOSE 114* 104* -- 119*  BUN 16 18 -- 18  CREATININE 0.63 0.65 0.69 --  CALCIUM 8.9 9.1 -- 9.3   LFT  Basename 12/20/11 1818  PROT 6.5  ALBUMIN 2.4*  AST 32  ALT 30  ALKPHOS 54  BILITOT 0.2*  BILIDIR --  IBILI --   PT/INR  Basename 12/20/11 1818  LABPROT 13.9  INR 1.05   Hepatitis Panel No results found for this basename: HEPBSAG,HCVAB,HEPAIGM,HEPBIGM in the last 72 hours C-Diff No results found for this basename: CDIFFTOX:3 in the last 72 hours Fecal Lactopherrin No results found for this basename: FECLLACTOFRN in the last 72 hours  Studies/Results: Mr Abdomen W Wo Contrast  12/22/2011  *RADIOLOGY REPORT*  Clinical Data: Hypoglycemia. Evaluate for insulinoma.  MRI ABDOMEN WITH AND WITHOUT CONTRAST  Technique:  Multiplanar multisequence MR imaging of the abdomen was performed both before and after administration of intravenous contrast.  Contrast: 20mL MULTIHANCE GADOBENATE DIMEGLUMINE 529  MG/ML IV SOLN  Comparison: None.  Findings:  Diminished exam detail due to motion artifact.  Pancreas appears normal.  No mass identified.  The spleen is within normal limits.  Within the right hepatic lobe there is a focal area of mild T2 hyperintensity which measures 2 cm.  This area was not covered on the T1-weighted images and the images performed with contrast material.  The remaining visualized portions of the liver appear within normal limits.  The gallbladder appears normal.  There is no biliary dilatation.  Small T2 hyperintensity within the mid pole of the left kidney measures 8 mm.  This is difficult to characterize on the postcontrast images.  No obvious enhancement is noted.  No upper abdominal adenopathy.  There is no upper abdominal ascites.  The visualized bony structures appear normal.  Large lipoma is identified within the right lateral abdominal wall.  IMPRESSION:  1.  Diminished exam detail due to respiratory motion artifact. 2.  No pancreatic mass identified. 3.  Indeterminant T2 intense structure within the right hepatic lobe.  Not imaged on the T1-weighted images were the postcontrast images.  Because of the patient's difficulty with breath-hold I would advise a three-phase liver imaging with contrast enhanced CT of the abdomen for more definitive assessment.  Original Report Authenticated By: Rosealee Albee, M.D.    Medications:  Scheduled:   . amiodarone  200 mg Oral BID  PC  . antiseptic oral rinse  15 mL Mouth Rinse BID  . aspirin EC  81 mg Oral Daily  . bisacodyl  5 mg Oral Once  . cefTRIAXone (ROCEPHIN)  IV  2 g Intravenous Q24H  . chlorhexidine  60 mL Topical Once  . chlorhexidine  60 mL Topical Once  . chlorhexidine  60 mL Topical Once  . heparin subcutaneous  5,000 Units Subcutaneous Q8H  . hydrocortisone sodium succinate  50 mg Intravenous Q6H  . metoprolol tartrate  25 mg Oral BID  . potassium chloride  40 mEq Oral Once  . sodium chloride  1,000 mL Intravenous Q8H   . vancomycin  1,000 mg Intravenous Q8H  . DISCONTD: enoxaparin  40 mg Subcutaneous Q24H   Continuous:   . dextrose 5 % and 0.9% NaCl 30 mL/hr at 12/21/11 0234    Assessment/Plan: 1) Hypoglycemia.   I reviewed the MRI with radiology and unfortunately it is of low quality secondary to the respiratory movements.  The head and body of the pancreas are well visualized, but the tail is not able to be seen.  Further evaluation with an EUS will be pursued.  Plan: 1) EUS tomorrow.  LOS: 9 days   Netanya Yazdani D 12/23/2011, 1:47 PM   The patient is undergoing cardiothoracic surgery tomorrow.  I will cancel her EUS.  Work up can be performed at a later date.  Signing off.

## 2011-12-23 NOTE — Progress Notes (Signed)
Occupational Therapy Treatment Patient Details Name: Courtney Faulkner MRN: [id number] DOB: 06-30-1951 Today's Date: 12/23/2011 Time: 1024-1050 OT Time Calculation (min): 26 min  OT Assessment / Plan / Recommendation Comments on Treatment Session Pt. moving well and improving functionally during ADLs today. Pt. continues to have Rt. UE coordination and weakness deficits.    Follow Up Recommendations  Inpatient Rehab    Equipment Recommendations  Defer to next venue    Frequency Min 3X/week   Plan Discharge plan remains appropriate    Precautions / Restrictions Precautions Precautions: Fall Restrictions Weight Bearing Restrictions: No   Pertinent Vitals/Pain None; pt.stable throughout    ADL  Grooming: Performed;Teeth care;Minimal assistance;Moderate assistance;Wash/dry face Where Assessed - Grooming: Standing at sink Toilet Transfer: Simulated;Moderate assistance Toilet Transfer Method: Stand pivot Toilet Transfer Equipment: Other (comment) (recliner) Equipment Used: Gait belt Ambulation Related to ADLs: NA ADL Comments: Pt. feeling dizzy today and per RN this has been going on for a little while. BP stable throughout and documented in note above. Pt. educated on Rt. UE exercises to complete throughout the day due to decreased coordination and control with movements. Pt. able to bring each fingertip to thumb with increased time on rt. hand. Pt. with minimal control with bringing hand to mouth and encouraged pt. to continue rt. UE exercises throughout the day and pt. verbalized andemonstrated understanding of technique. Pt. completed ADL tasks standing at sink with UE support on countertop and mod assist for maintaining upright position due to left lateral lean to elicit rt. LE weightbearing. Pt.. incorporated use of Rt. UE during brushing teeth and washing face with mod verbal cues and min assist provided at rt. elbow to achieve the brushing motion for completing task due to rt. UE  weakness.    OT Goals Acute Rehab OT Goals OT Goal Formulation: With patient Time For Goal Achievement: 01/03/12 Potential to Achieve Goals: Good ADL Goals Pt Will Perform Grooming: with set-up;Sitting at sink;Standing at sink ADL Goal: Grooming - Progress: Progressing toward goals Pt Will Transfer to Toilet: with supervision;Ambulation;with DME;3-in-1 ADL Goal: Toilet Transfer - Progress: Progressing toward goals Arm Goals Additional Arm Goal #1: Pt will incorporate RUE during all ADL acitvity. Arm Goal: Additional Goal #1 - Progress: Progressing toward goals Miscellaneous OT Goals Miscellaneous OT Goal #1: Pt will perform dynamic standing balance activity ~5 min with supervision in prep for grooming ADL. OT Goal: Miscellaneous Goal #1 - Progress: Progressing toward goals  Visit Information  Last OT Received On: 12/23/11 Assistance Needed: +1          Cognition  Overall Cognitive Status: Impaired Area of Impairment: Problem solving Arousal/Alertness: Awake/alert Orientation Level: Appears intact for tasks assessed Behavior During Session: [facility] for tasks performed Following Commands: Follows one step commands inconsistently Problem Solving: Pt slow to problem solve and needs extra time to complete task Cognition - Other Comments: Slow to process    Mobility Bed Mobility Bed Mobility: Supine to Sit Supine to Sit: 4: Min guard;HOB flat;With rails Transfers Transfers: Sit to Stand;Stand to Sit Sit to Stand: 3: Mod assist;From chair/3-in-1;With upper extremity assist (pt=60%) Sit to Stand: Patient Percentage: 60% Stand to Sit: 4: Min assist;To chair/3-in-1;With armrests Details for Transfer Assistance: Mod verbal cues for hand placement and technique          End of Session OT - End of Session Equipment Utilized During Treatment: Gait belt Activity Tolerance: Patient tolerated treatment well Patient left: in chair;with call bell/phone within reach;with family/visitor  present Nurse Communication:  Mobility status   Cassandria Anger, OTR/L Pager [phone] 12/23/2011, 1:02 PM

## 2011-12-23 NOTE — Progress Notes (Signed)
Stroke Team Progress Note  HISTORY Courtney Faulkner is an 61 y.o. female with dysarthria, aphasia and right sided weakness. According to son at bedside, his father heard her vaccuming at ~08:30 on 12/14/2011. He came down stairs at 11:30 and found her confused and dysarthric. EMS called and found BS 36. Patient was subsequently brought to the ED. She arrive at 1214. In the ED patient was given some orange juice and her blood CBG went to 166. Patient subsequently had a head CT done which was negative. Initially a code stroke was called however it was canceled secondary to patient's hypoglycemic spells. Per family patient was recently been treated for upper respiratory symptoms with prednisone and albuterol. Patient and family deny any recent fevers, no chills, no chest pain, no diarrhea, no constipation, no dysuria, no generalized weakness. Patient's family does endorse some shortness of breath on exertion when patient climbs up stairs which has been ongoing for the past 2 weeks. On exam and during the interview patient has some dysarthric speech is alert to self only and has an incoherent speech as well. After treatment, CBG 166, pt remains confused, and having difficulty following commands.  Neuro consult called by Dr. Janee Morn for this reason. Patient was not a TPA candidate secondary to presenting beyond the tPA window. She was transferred to the ICU after admission for hypoglycemia and neuro decline for further evaluation and treatment.  SUBJECTIVE "I feel ok". Ready for OR tomorrow.  OBJECTIVE Most recent Vital Signs: Filed Vitals:   12/23/11 0001 12/23/11 0600 12/23/11 0603 12/23/11 0659  BP: 106/68 119/73 119/74 145/89  Pulse: 56 59 67 67  Temp: 99.1 F (37.3 C) 98.6 F (37 C)    TempSrc:      Resp: 16 16    Height:      Weight:      SpO2: 93% 95%     CBG (last 3)   Basename 12/23/11 0735 12/23/11 0539 12/23/11 0002  GLUCAP 95 132* 107*   Intake/Output from previous day: 04/28 0701  - 04/29 0700 In: 2250 [IV Piggyback:2250] Out: -   IV Fluid Intake:     . dextrose 5 % and 0.9% NaCl 30 mL/hr at 12/21/11 0234   MEDICATIONS    . amiodarone  200 mg Oral BID PC  . antiseptic oral rinse  15 mL Mouth Rinse BID  . aspirin EC  81 mg Oral Daily  . bisacodyl  5 mg Oral Once  . cefTRIAXone (ROCEPHIN)  IV  2 g Intravenous Q24H  . chlorhexidine  60 mL Topical Once  . chlorhexidine  60 mL Topical Once  . chlorhexidine  60 mL Topical Once  . enoxaparin  40 mg Subcutaneous Q24H  . hydrocortisone sodium succinate  50 mg Intravenous Q6H  . metoprolol tartrate  25 mg Oral BID  . potassium chloride  40 mEq Oral Once  . sodium chloride  1,000 mL Intravenous Q8H  . vancomycin  1,000 mg Intravenous Q8H  . DISCONTD: metoprolol tartrate  12.5 mg Oral Once   PRN:  acetaminophen, acetaminophen, dextrose, gadobenate dimeglumine, ondansetron (ZOFRAN) IV, ondansetron (ZOFRAN) IV, temazepam  Diet:  General thin liquids Activity:   Bathroom privileges with assistance DVT Prophylaxis:  Lovenox 40 mg sq daily   CLINICALLY SIGNIFICANT STUDIES CBC    Component Value Date/Time   WBC 8.5 12/23/2011 0615   RBC 3.50* 12/23/2011 0615   HGB 10.8* 12/23/2011 0615   HCT 33.1* 12/23/2011 0615   PLT 342 12/23/2011 0615  MCV 94.6 12/23/2011 0615   MCH 30.9 12/23/2011 0615   MCHC 32.6 12/23/2011 0615   RDW 13.9 12/23/2011 0615   LYMPHSABS 1.9 12/18/2011 0303   MONOABS 0.9 12/18/2011 0303   EOSABS 0.3 12/18/2011 0303   BASOSABS 0.1 12/18/2011 0303   CMP    Component Value Date/Time   NA 139 12/23/2011 0615   K 3.3* 12/23/2011 0615   CL 107 12/23/2011 0615   CO2 24 12/23/2011 0615   GLUCOSE 114* 12/23/2011 0615   BUN 16 12/23/2011 0615   CREATININE 0.63 12/23/2011 0615   CALCIUM 8.9 12/23/2011 0615   PROT 6.5 12/20/2011 1818   ALBUMIN 2.4* 12/20/2011 1818   AST 32 12/20/2011 1818   ALT 30 12/20/2011 1818   ALKPHOS 54 12/20/2011 1818   BILITOT 0.2* 12/20/2011 1818   GFRNONAA >90 12/23/2011 0615   GFRAA >90  12/23/2011 0615   COAGS Lab Results  Component Value Date   INR 1.05 12/20/2011   INR 1.04 12/17/2011   Lipid Panel    Component Value Date/Time   CHOL 134 12/16/2011 0311   TRIG 90 12/16/2011 0311   HDL 45 12/16/2011 0311   CHOLHDL 3.0 12/16/2011 0311   VLDL 18 12/16/2011 0311   LDLCALC 71 12/16/2011 0311   HgbA1C  Lab Results  Component Value Date   HGBA1C 5.1 12/20/2011   Cardiac Panel (last 3 results)  No results found for this basename: CKTOTAL:3,CKMB:3,TROPONINI:3,RELINDX:3 in the last 72 hours Urinalysis    Component Value Date/Time   COLORURINE YELLOW 12/14/2011 1255   APPEARANCEUR CLEAR 12/14/2011 1255   LABSPEC 1.015 12/14/2011 1255   PHURINE 7.5 12/14/2011 1255   GLUCOSEU 250* 12/14/2011 1255   HGBUR NEGATIVE 12/14/2011 1255   BILIRUBINUR NEGATIVE 12/14/2011 1255   KETONESUR NEGATIVE 12/14/2011 1255   PROTEINUR NEGATIVE 12/14/2011 1255   UROBILINOGEN 0.2 12/14/2011 1255   NITRITE NEGATIVE 12/14/2011 1255   LEUKOCYTESUR NEGATIVE 12/14/2011 1255   Urine Drug Screen  No results found for this basename: labopia,  cocainscrnur,  labbenz,  amphetmu,  thcu,  labbarb    Alcohol Level No results found for this basename: eth   VITAMIN B12     Status: Normal   Collection Time   12/15/11  9:00 AM      Component Value Range   Vitamin B-12 287  211 - 911 (pg/mL)  IRON AND TIBC     Status: Abnormal   Collection Time   12/15/11  9:00 AM      Component Value Range   Iron 39 (*) 42 - 135 (ug/dL)   TIBC 161 (*) 096 - 045 (ug/dL)   Saturation Ratios 17 (*) 20 - 55 (%)   UIBC 192  125 - 400 (ug/dL)  FERRITIN     Status: Normal   Collection Time   12/15/11  9:00 AM      Component Value Range   Ferritin 200  10 - 291 (ng/mL)  RETICULOCYTES     Status: Normal   Collection Time   12/15/11  9:00 AM      Component Value Range   Retic Ct Pct 2.9  0.4 - 3.1 (%)   RBC. 3.97  3.87 - 5.11 (MIL/uL)   Retic Count, Manual 115.1  19.0 - 186.0 (K/uL)  PRO B NATRIURETIC PEPTIDE     Status:  Abnormal   Collection Time   12/15/11  9:00 AM      Component Value Range   Pro B Natriuretic peptide (BNP) 441.6 (*)  0 - 125 (pg/mL)   CT of the brain   12/15/2011  Developing cytotoxic edema correlates with the observed pattern of restricted diffusion on MR, representing acute infarction. This primarily involves the left basal ganglia.  Smaller areas of cortical and subcortical infarction seen on MRI in the left hemisphere are too small to be easily visible on CT.   No incipient uncal or transtentorial herniation, although minimal 1 mm left to right shift may be present.  No hemorrhagic transformation.    12/14/2011  Normal CT of the head without contrast.   CT Angio Head 12/16/2011  Acute non hemorrhagic infarct left basal ganglia, left uncus and left corona radiata.  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  Collateral flow to the left anterior cerebral artery and left middle cerebral artery branches.  The flow within the left middle cerebral artery branches is diminished suggesting that collateral flow is not adequate.   CT Angio Neck 12/16/2011  Abnormal appearance of the left internal carotid artery with appearance of dissection with complete occlusion at the C2 level.  Abnormal appearance of the proximal left common carotid artery. Question if this is related to artifact versus dissection versus atherosclerotic type changes.  Mild narrowing proximal right common carotid artery.  Prominent ectasia of the proximal right common carotid artery with fold and mild narrowing.  Mild irregularity at the right carotid bifurcation without measurable stenosis.  Mild to moderate narrowing proximal right vertebral artery.  Mild irregularity throughout the course of the right vertebral artery.  Left vertebral artery arises directly from the aortic arch. Mild narrowing proximal left vertebral artery.  Evaluation of portions of the proximal left vertebral artery is limited by artifact.  Nonspecific parenchymal  changes lung apices greater on the right. Findings can be evaluated with a formal chest CT.   MRI of the brain  12/14/2011  Moderate sized acute left hemisphere infarct which appears to be secondary to an acute left internal carotid artery occlusion. No visible hemorrhage or midline shift.  Emboli to the left middle cerebral artery and its branches is not excluded.  MRA of the brain  See CT angio  2D Echocardiogram  EF 60-65% with no source of embolus.  Left atrial mass attached to intra-atrial septum highly suggestive of myxoma.  Carotid Doppler  Right: no ICA stenosis. Left: ICA appears occluded. Bilateral vertebral artery flow is antegrade.  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  CXR  12/18/2011 Some increase in airspace disease likely due to progressive edema. 12/16/2011  Appearance of chest is similar to the recent prior study, again suggesting a background of mild interstitial pulmonary edema. Overall, aeration has slightly improved, with decreasing small bilateral pleural effusions. 12/14/2011   Cardiomegaly and bilateral airspace disease most consistent with pulmonary edema.     CT chest 12/17/2011 Respiratory motion degraded examination with poor inspiration. Scattered pulmonary parenchymal changes some which appears to represent scarring/interstitial fibrosis. Right upper lobe focal bronchiectasis. Scattered regions of mild consolidation may represent scarring although difficult to exclude superimposed infection  CT Abdomen and Pelvis 12/17/2011 On this unenhanced examination, no definitive pancreatic masses identified however, evaluation without contrast is markedly limited. For detection of a neuroendocrine tumor, preferable examination is contrast enhanced MR. If the patient cannot hold her breath, CT with contrast with pancreatic protocol can be obtained. Very small amount of free fluid. Etiology indeterminate.  Cardiac  Cath 12/18/2011  1. Normal coronary arteries.  2. Normal abdominal aorta, iliac and femoral arteries. 3. Left atrial myxoma identified by neovascularization during right coronary angiography. 4. Left ventriculography was not performed.  EKG  normal sinus rhythm.   Physical Exam   Pleasant middle-age [place] American lady currently not in distress.Awake alert. Afebrile. Head is nontraumatic. Neck is supple without bruit. Hearing is normal. Cardiac exam no murmur or gallop. Lungs are clear to auscultation. Distal pulses are well felt.  Neurological Exam ; awake alert mild expressive aphasia with word finding difficulties and paraphasias. Good comprehension  and repitition but poor naming.. Able to follow commands well. Extraocular movements are full range without nystagmus. No visual field defect to bedside confrontational testing. Mild right lower facial weakness. Tongue is midline. Motor system exam reveals mild right hemiparesis with RUE 3/5 weaknessand moderate right grip and intrinsic hand muscles. Good right lower extremity strength without drift. The minimum right hip flexion and ankle dorsiflexion weakness. Sensation slightly diminished on the right compared to the left. Coordination is slow but accurate. Gait was not tested.  ASSESSMENT Courtney Faulkner is a 61 y.o. 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 Neuro improvement since admission. On no antiplatelets prior to admission. Now on aspirin 325 mg orally every day for secondary stroke prevention. Patient with resultant right hemiparesis and expressive aphasia with improvement. Ok for IV heparin from neuro standpoint. Patient is at risk for additional neuro events.  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 Cath clear for OR. 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 Etiology, Insulinoma not confirmed on CT. Workup underway -Nonspecific parenchymal changes lung apices greater on the right. With progressive pulmonary edema today. -elevated cardiac enzymes, cardiac related.  -short PR -obesity, class 2, Body mass index is 35.69 kg/(m^2).  Hospital day # 9  TREATMENT/PLAN -surgery to remove myxoma. Agree with plans for tomorrow -ok for IV heparin from neuro standpoint for OR tomorrow.  Joaquin Music, ANP-BC, GNP-BC [facility] Pager: [phone] 12/23/2011 9:13 AM  Dr. Delia Heady, Stroke Center Medical Director, has personally reviewed chart, pertinent data, examined the patient and developed the plan of care. Pager:  [phone]  ,

## 2011-12-23 NOTE — Progress Notes (Signed)
   CARDIOTHORACIC SURGERY PROGRESS NOTE  5 Days Post-Op  S/P Procedure(s) (LRB): CORONARY ANGIOGRAM () ABDOMINAL ANGIOGRAM ()  Subjective: No complaints.  Modest progress with mobility and difficultly finding words.  Objective: Vital signs in last 24 hours: Temp:  [98.6 F (37 C)-99.1 F (37.3 C)] 98.7 F (37.1 C) (04/29 1200) Pulse Rate:  [56-76] 69  (04/29 1200) Cardiac Rhythm:  [-] Normal sinus rhythm (04/29 0800) Resp:  [16-18] 17  (04/29 1200) BP: (100-145)/(59-89) 100/67 mmHg (04/29 1200) SpO2:  [93 %-98 %] 98 % (04/29 1200)  Physical Exam:  Rhythm:   sinus  Breath sounds: Diminished at bases  Heart sounds:  RRR  Incisions:  n/a  Abdomen:  soft  Extremities:  warm   Intake/Output from previous day: 04/28 0701 - 04/29 0700 In: 2250 [IV Piggyback:2250] Out: -  Intake/Output this shift:    Lab Results:  Basename 12/23/11 0615 12/22/11 0600  WBC 8.5 9.0  HGB 10.8* 11.0*  HCT 33.1* 32.9*  PLT 342 328   BMET:  Basename 12/23/11 0615 12/22/11 0600  NA 139 137  K 3.3* 3.7  CL 107 103  CO2 24 24  GLUCOSE 114* 104*  BUN 16 18  CREATININE 0.63 0.65  CALCIUM 8.9 9.1    CBG (last 3)   Basename 12/23/11 1725 12/23/11 1114 12/23/11 0735  GLUCAP 74 130* 95   PT/INR:  No results found for this basename: LABPROT,INR in the last 72 hours  CXR:  N/A  Assessment/Plan: S/P Procedure(s) (LRB): CORONARY ANGIOGRAM () ABDOMINAL ANGIOGRAM ()  I spent in excess of 30 minutes reviewing the indications, risks, and potential benefits of surgery with Courtney Faulkner and Courtney Faulkner.  We discussed the timing of surgery, the risk of another stroke, and expectations for Courtney recovery.  All questions answered.  We plan OR in am.  We sent a cath urine culture because of borderline urinalysis and will give one dose of Ciprofloxacin tonight.  Courtney Faulkner H 12/23/2011 8:02 PM

## 2011-12-23 NOTE — Progress Notes (Signed)
Pt scheduled for surgery tomorrow-removal of L atrial myxoma by Dr. Cornelius Moras.  Pt husband is waiting to sign consent form until he speaks with Dr. Cornelius Moras directly regarding procedure. Pt husband states he will come to hospital tonight at 1930. Dr. Cornelius Moras notified and made aware that pt's husband is also available by telephone.  Dr. Manson Passey also notified that consent remains unsigned at this time.  Efraim Kaufmann

## 2011-12-23 NOTE — Progress Notes (Signed)
PT Cancellation Note  Treatment cancelled today due to patient receiving procedure or test.  Courtney Faulkner 12/23/2011, 5:34 PM Lavert Matousek L. Osha Errico DPT [phone]

## 2011-12-24 ENCOUNTER — Encounter (HOSPITAL_COMMUNITY)
Admission: EM | Disposition: A | Discharge status: Rehabilitation Facility | Payer: Self-pay | Source: Home / Self Care | Attending: Thoracic Surgery (Cardiothoracic Vascular Surgery)

## 2011-12-24 ENCOUNTER — Encounter (HOSPITAL_COMMUNITY): Payer: Self-pay | Admitting: Anesthesiology

## 2011-12-24 ENCOUNTER — Inpatient Hospital Stay (HOSPITAL_COMMUNITY): Payer: BC Managed Care – PPO

## 2011-12-24 ENCOUNTER — Encounter (HOSPITAL_COMMUNITY): Payer: Self-pay | Admitting: Thoracic Surgery (Cardiothoracic Vascular Surgery)

## 2011-12-24 ENCOUNTER — Inpatient Hospital Stay (HOSPITAL_COMMUNITY): Payer: BC Managed Care – PPO | Admitting: Anesthesiology

## 2011-12-24 DIAGNOSIS — D487 Neoplasm of uncertain behavior of other specified sites: Secondary | ICD-10-CM

## 2011-12-24 HISTORY — PX: OTHER SURGICAL HISTORY: SHX169

## 2011-12-24 LAB — POCT I-STAT 4, (NA,K, GLUC, HGB,HCT)
Glucose, Bld: 104 mg/dL — ABNORMAL HIGH (ref 70–99)
Glucose, Bld: 117 mg/dL — ABNORMAL HIGH (ref 70–99)
HCT: 31 % — ABNORMAL LOW (ref 36.0–46.0)
HCT: 22 % — ABNORMAL LOW (ref 36.0–46.0)
HCT: 24 % — ABNORMAL LOW (ref 36.0–46.0)
HCT: 28 % — ABNORMAL LOW (ref 36.0–46.0)
Hemoglobin: 10.5 g/dL — ABNORMAL LOW (ref 12.0–15.0)
Hemoglobin: 7.5 g/dL — ABNORMAL LOW (ref 12.0–15.0)
Hemoglobin: 9.5 g/dL — ABNORMAL LOW (ref 12.0–15.0)
Potassium: 3.7 mEq/L (ref 3.5–5.1)
Potassium: 3.8 mEq/L (ref 3.5–5.1)
Sodium: 140 mEq/L (ref 135–145)
Sodium: 143 mEq/L (ref 135–145)
Sodium: 143 mEq/L (ref 135–145)

## 2011-12-24 LAB — POCT I-STAT 3, ART BLOOD GAS (G3+)
Acid-base deficit: 2 mmol/L (ref 0.0–2.0)
Acid-base deficit: 1 mmol/L (ref 0.0–2.0)
Bicarbonate: 23.7 mEq/L (ref 20.0–24.0)
O2 Saturation: 100 %
O2 Saturation: 100 %
TCO2: 25 mmol/L (ref 0–100)
TCO2: 24 mmol/L (ref 0–100)
pCO2 arterial: 39.6 mmHg (ref 35.0–45.0)
pO2, Arterial: 391 mmHg — ABNORMAL HIGH (ref 80.0–100.0)
pO2, Arterial: 88 mmHg (ref 80.0–100.0)

## 2011-12-24 LAB — CREATININE, SERUM
Creatinine, Ser: 0.69 mg/dL (ref 0.50–1.10)
GFR calc Af Amer: >90 mL/min (ref >90)
GFR calc non Af Amer: >90 mL/min (ref >90)

## 2011-12-24 LAB — CBC
HCT: 30.2 % — ABNORMAL LOW (ref 36.0–46.0)
Hemoglobin: 10 g/dL — ABNORMAL LOW (ref 12.0–15.0)
MCH: 30.6 pg (ref 26.0–34.0)
MCV: 94.3 fL (ref 78.0–100.0)
Platelets: 232 10*3/uL (ref 150–400)
RBC: 2.97 MIL/uL — ABNORMAL LOW (ref 3.87–5.11)
RBC: 3.19 MIL/uL — ABNORMAL LOW (ref 3.87–5.11)
RDW: 14.3 % (ref 11.5–15.5)
WBC: 19.2 10*3/uL — ABNORMAL HIGH (ref 4.0–10.5)

## 2011-12-24 LAB — URINE CULTURE
Colony Count: >=100000
Culture  Setup Time: 201304291559

## 2011-12-24 LAB — POCT I-STAT, CHEM 8
Chloride: 110 mEq/L (ref 96–112)
Glucose, Bld: 122 mg/dL — ABNORMAL HIGH (ref 70–99)
HCT: 29 % — ABNORMAL LOW (ref 36.0–46.0)
Hemoglobin: 9.9 g/dL — ABNORMAL LOW (ref 12.0–15.0)
Potassium: 4.4 mEq/L (ref 3.5–5.1)
Sodium: 141 mEq/L (ref 135–145)

## 2011-12-24 LAB — CULTURE, BLOOD (ROUTINE X 2)
Culture  Setup Time: 201304240018
Culture  Setup Time: 201304240018
Culture: NO GROWTH

## 2011-12-24 LAB — GLUCOSE, CAPILLARY
Glucose-Capillary: 89 mg/dL (ref 70–99)
Glucose-Capillary: 114 mg/dL — ABNORMAL HIGH (ref 70–99)
Glucose-Capillary: 96 mg/dL (ref 70–99)
Glucose-Capillary: 56 mg/dL — ABNORMAL LOW (ref 70–99)

## 2011-12-24 LAB — HEMOGLOBIN AND HEMATOCRIT, BLOOD
HCT: 21.7 % — ABNORMAL LOW (ref 36.0–46.0)
Hemoglobin: 7 g/dL — ABNORMAL LOW (ref 12.0–15.0)

## 2011-12-24 LAB — MAGNESIUM: Magnesium: 3.1 mg/dL — ABNORMAL HIGH (ref 1.5–2.5)

## 2011-12-24 SURGERY — EXCISION, MYXOMA, CARDIAC ATRIUM, MINIMALLY INVASIVE
Anesthesia: General | Site: Chest | Wound class: Clean

## 2011-12-24 SURGERY — UPPER ENDOSCOPIC ULTRASOUND (EUS) LINEAR
Anesthesia: Moderate Sedation

## 2011-12-24 MED ORDER — MORPHINE SULFATE 4 MG/ML IJ SOLN: 2.0000 mg | INTRAMUSCULAR | Status: DC | PRN

## 2011-12-24 MED ORDER — SODIUM CHLORIDE 0.9 % IV SOLN
0.1000 ug/kg/h | INTRAVENOUS | Status: DC
  Filled 2011-12-24: qty 2

## 2011-12-24 MED ORDER — SODIUM CHLORIDE 0.9 % IV SOLN
INTRAVENOUS | Status: DC
  Administered 2011-12-29: 02:00:00 via INTRAVENOUS

## 2011-12-24 MED ORDER — ACETAMINOPHEN 500 MG PO TABS
1000.0000 mg | ORAL_TABLET | Freq: Four times a day (QID) | ORAL | Status: DC
  Administered 2011-12-25 – 2011-12-29 (×18): 1000 mg via ORAL
  Filled 2011-12-24 (×19): qty 2

## 2011-12-24 MED ORDER — ACETAMINOPHEN 160 MG/5ML PO SOLN: 650.0000 mg | ORAL | Status: AC

## 2011-12-24 MED ORDER — ALBUMIN HUMAN 5 % IV SOLN
250.0000 mL | INTRAVENOUS | Status: AC | PRN
  Administered 2011-12-24 – 2011-12-25 (×4): 250 mL via INTRAVENOUS
  Filled 2011-12-24 (×2): qty 250

## 2011-12-24 MED ORDER — PROPOFOL 10 MG/ML IV EMUL
INTRAVENOUS | Status: DC | PRN
  Administered 2011-12-24 (×7): 50 mg via INTRAVENOUS

## 2011-12-24 MED ORDER — ASPIRIN 81 MG PO CHEW: 324.0000 mg | CHEWABLE_TABLET | Freq: Every day | ORAL | Status: DC

## 2011-12-24 MED ORDER — MIDAZOLAM HCL 2 MG/2ML IJ SOLN: 2.0000 mg | INTRAMUSCULAR | Status: DC | PRN

## 2011-12-24 MED ORDER — DEXTROSE 5 % IV SOLN
1.5000 g | Freq: Two times a day (BID) | INTRAVENOUS | Status: AC
  Administered 2011-12-24 – 2011-12-26 (×4): 1.5 g via INTRAVENOUS
  Filled 2011-12-24 (×4): qty 1.5

## 2011-12-24 MED ORDER — ACETAMINOPHEN 160 MG/5ML PO SOLN
975.0000 mg | Freq: Four times a day (QID) | ORAL | Status: DC
  Administered 2011-12-24: 975 mg
  Filled 2011-12-24: qty 40.6

## 2011-12-24 MED ORDER — SODIUM CHLORIDE 0.9 % IJ SOLN
3.0000 mL | Freq: Two times a day (BID) | INTRAMUSCULAR | Status: DC
  Administered 2011-12-25 – 2011-12-26 (×2): 3 mL via INTRAVENOUS
  Administered 2011-12-27: 21:00:00 via INTRAVENOUS
  Administered 2011-12-27 – 2011-12-29 (×4): 3 mL via INTRAVENOUS
  Administered 2011-12-29: 10 mL via INTRAVENOUS

## 2011-12-24 MED ORDER — INSULIN REGULAR BOLUS VIA INFUSION
0.0000 [IU] | Freq: Three times a day (TID) | INTRAVENOUS | Status: DC
  Filled 2011-12-24: qty 10

## 2011-12-24 MED ORDER — LACTATED RINGERS IV SOLN
INTRAVENOUS | Status: DC | PRN
  Administered 2011-12-24: 08:00:00 via INTRAVENOUS

## 2011-12-24 MED ORDER — HEPARIN SODIUM (PORCINE) 1000 UNIT/ML IJ SOLN
INTRAMUSCULAR | Status: DC | PRN
  Administered 2011-12-24: 36000 [IU] via INTRAVENOUS

## 2011-12-24 MED ORDER — BISACODYL 10 MG RE SUPP: 10.0000 mg | Freq: Every day | RECTAL | Status: DC

## 2011-12-24 MED ORDER — SODIUM CHLORIDE 0.9 % IJ SOLN: 3.0000 mL | INTRAMUSCULAR | Status: DC | PRN

## 2011-12-24 MED ORDER — PHENYLEPHRINE HCL 10 MG/ML IJ SOLN
20.0000 mg | INTRAVENOUS | Status: DC | PRN
  Administered 2011-12-24: 10 ug/min via INTRAVENOUS

## 2011-12-24 MED ORDER — SODIUM CHLORIDE 0.9 % IV SOLN
0.1000 ug/kg/h | INTRAVENOUS | Status: DC
  Filled 2011-12-24: qty 4

## 2011-12-24 MED ORDER — ASPIRIN EC 325 MG PO TBEC
325.0000 mg | DELAYED_RELEASE_TABLET | Freq: Every day | ORAL | Status: DC
  Administered 2011-12-25 – 2011-12-29 (×5): 325 mg via ORAL
  Filled 2011-12-24 (×5): qty 1

## 2011-12-24 MED ORDER — METOPROLOL TARTRATE 1 MG/ML IV SOLN
2.5000 mg | INTRAVENOUS | Status: DC | PRN
  Administered 2011-12-27 (×2): 2 mg via INTRAVENOUS
  Administered 2011-12-28 (×2): 2.5 mg via INTRAVENOUS
  Filled 2011-12-24 (×2): qty 5

## 2011-12-24 MED ORDER — FENTANYL CITRATE 0.05 MG/ML IJ SOLN
INTRAMUSCULAR | Status: DC | PRN
  Administered 2011-12-24 (×3): 150 ug via INTRAVENOUS
  Administered 2011-12-24: 400 ug via INTRAVENOUS
  Administered 2011-12-24: 100 ug via INTRAVENOUS

## 2011-12-24 MED ORDER — NITROGLYCERIN IN D5W 200-5 MCG/ML-% IV SOLN: 0.0000 ug/min | INTRAVENOUS | Status: DC

## 2011-12-24 MED ORDER — POTASSIUM CHLORIDE 10 MEQ/50ML IV SOLN
10.0000 meq | Freq: Once | INTRAVENOUS | Status: AC
  Administered 2011-12-24: 10 meq via INTRAVENOUS

## 2011-12-24 MED ORDER — PHENYLEPHRINE HCL 10 MG/ML IJ SOLN
0.0000 ug/min | INTRAVENOUS | Status: DC
  Administered 2011-12-25: 15 ug/min via INTRAVENOUS
  Filled 2011-12-24 (×3): qty 2

## 2011-12-24 MED ORDER — OXYCODONE HCL 5 MG PO TABS
5.0000 mg | ORAL_TABLET | ORAL | Status: DC | PRN
  Administered 2011-12-25: 5 mg via ORAL
  Filled 2011-12-24: qty 1

## 2011-12-24 MED ORDER — PHENYLEPHRINE HCL 10 MG/ML IJ SOLN
10.0000 mg | INTRAVENOUS | Status: DC | PRN
  Administered 2011-12-24: 10 ug/min via INTRAVENOUS

## 2011-12-24 MED ORDER — PROTAMINE SULFATE 10 MG/ML IV SOLN
INTRAVENOUS | Status: DC | PRN
  Administered 2011-12-24: 50 mg via INTRAVENOUS
  Administered 2011-12-24: 10 mg via INTRAVENOUS
  Administered 2011-12-24: 300 mg via INTRAVENOUS

## 2011-12-24 MED ORDER — DOCUSATE SODIUM 100 MG PO CAPS
200.0000 mg | ORAL_CAPSULE | Freq: Every day | ORAL | Status: DC
  Administered 2011-12-25 – 2011-12-27 (×3): 200 mg via ORAL
  Filled 2011-12-24 (×3): qty 2

## 2011-12-24 MED ORDER — GLUTARALDEHYDE 0.625% SOAKING SOLUTION
TOPICAL | Status: DC
  Filled 2011-12-24 (×2): qty 50

## 2011-12-24 MED ORDER — INSULIN ASPART 100 UNIT/ML ~~LOC~~ SOLN
0.0000 [IU] | SUBCUTANEOUS | Status: AC
  Administered 2011-12-24 (×2): 2 [IU] via SUBCUTANEOUS

## 2011-12-24 MED ORDER — FAMOTIDINE IN NACL 20-0.9 MG/50ML-% IV SOLN
20.0000 mg | Freq: Two times a day (BID) | INTRAVENOUS | Status: AC
  Administered 2011-12-24 – 2011-12-25 (×2): 20 mg via INTRAVENOUS
  Filled 2011-12-24: qty 50

## 2011-12-24 MED ORDER — MIDAZOLAM HCL 5 MG/5ML IJ SOLN
INTRAMUSCULAR | Status: DC | PRN
  Administered 2011-12-24: 5 mg via INTRAVENOUS
  Administered 2011-12-24: 2 mg via INTRAVENOUS
  Administered 2011-12-24: 3 mg via INTRAVENOUS

## 2011-12-24 MED ORDER — ROCURONIUM BROMIDE 100 MG/10ML IV SOLN
INTRAVENOUS | Status: DC | PRN
  Administered 2011-12-24: 50 mg via INTRAVENOUS

## 2011-12-24 MED ORDER — INSULIN ASPART 100 UNIT/ML ~~LOC~~ SOLN: 0.0000 [IU] | SUBCUTANEOUS | Status: DC

## 2011-12-24 MED ORDER — SODIUM CHLORIDE 0.9 % IV SOLN
INTRAVENOUS | Status: DC
  Filled 2011-12-24: qty 1

## 2011-12-24 MED ORDER — METOPROLOL TARTRATE 12.5 MG HALF TABLET
12.5000 mg | ORAL_TABLET | Freq: Two times a day (BID) | ORAL | Status: DC
  Filled 2011-12-24: qty 1

## 2011-12-24 MED ORDER — SODIUM CHLORIDE 0.45 % IV SOLN
INTRAVENOUS | Status: DC
  Administered 2011-12-24: 20 mL/h via INTRAVENOUS

## 2011-12-24 MED ORDER — PANTOPRAZOLE SODIUM 40 MG PO TBEC
40.0000 mg | DELAYED_RELEASE_TABLET | Freq: Every day | ORAL | Status: DC
  Administered 2011-12-26 – 2011-12-29 (×4): 40 mg via ORAL
  Filled 2011-12-24 (×4): qty 1

## 2011-12-24 MED ORDER — METOPROLOL TARTRATE 25 MG/10 ML ORAL SUSPENSION
12.5000 mg | Freq: Two times a day (BID) | ORAL | Status: DC
  Filled 2011-12-24: qty 5

## 2011-12-24 MED ORDER — VECURONIUM BROMIDE 10 MG IV SOLR
INTRAVENOUS | Status: DC | PRN
  Administered 2011-12-24 (×3): 10 mg via INTRAVENOUS

## 2011-12-24 MED ORDER — MAGNESIUM SULFATE 40 MG/ML IJ SOLN
4.0000 g | Freq: Once | INTRAMUSCULAR | Status: AC
  Administered 2011-12-24: 4 g via INTRAVENOUS
  Filled 2011-12-24: qty 100

## 2011-12-24 MED ORDER — ACETAMINOPHEN 650 MG RE SUPP
650.0000 mg | RECTAL | Status: AC
  Administered 2011-12-24: 650 mg via RECTAL

## 2011-12-24 MED ORDER — TRANEXAMIC ACID 100 MG/ML IV SOLN
1.5000 mg/kg/h | INTRAVENOUS | Status: DC
  Filled 2011-12-24 (×2): qty 25

## 2011-12-24 MED ORDER — ONDANSETRON HCL 4 MG/2ML IJ SOLN: 4.0000 mg | Freq: Four times a day (QID) | INTRAMUSCULAR | Status: DC | PRN

## 2011-12-24 MED ORDER — AMIODARONE HCL IN DEXTROSE 360-4.14 MG/200ML-% IV SOLN
30.0000 mg/h | INTRAVENOUS | Status: AC
  Administered 2011-12-24 – 2011-12-26 (×4): 30 mg/h via INTRAVENOUS
  Filled 2011-12-24 (×8): qty 200

## 2011-12-24 MED ORDER — POTASSIUM CHLORIDE 10 MEQ/50ML IV SOLN
10.0000 meq | INTRAVENOUS | Status: AC
  Administered 2011-12-24 (×3): 10 meq via INTRAVENOUS

## 2011-12-24 MED ORDER — ALBUTEROL SULFATE (2.5 MG/3ML) 0.083% IN NEBU
INHALATION_SOLUTION | RESPIRATORY_TRACT | Status: DC | PRN
  Administered 2011-12-24: 1 mL via RESPIRATORY_TRACT

## 2011-12-24 MED ORDER — 0.9 % SODIUM CHLORIDE (POUR BTL) OPTIME
TOPICAL | Status: DC | PRN
  Administered 2011-12-24: 6000 mL

## 2011-12-24 MED ORDER — CALCIUM CHLORIDE 10 % IV SOLN
1.0000 g | Freq: Once | INTRAVENOUS | Status: AC | PRN
  Filled 2011-12-24: qty 10

## 2011-12-24 MED ORDER — LACTATED RINGERS IV SOLN: INTRAVENOUS | Status: DC

## 2011-12-24 MED ORDER — MORPHINE SULFATE 2 MG/ML IJ SOLN
1.0000 mg | INTRAMUSCULAR | Status: AC | PRN
  Administered 2011-12-24: 1 mg via INTRAVENOUS
  Filled 2011-12-24: qty 1

## 2011-12-24 MED ORDER — SODIUM CHLORIDE 0.9 % IV SOLN: 250.0000 mL | INTRAVENOUS | Status: DC

## 2011-12-24 MED ORDER — VANCOMYCIN HCL IN DEXTROSE 1-5 GM/200ML-% IV SOLN
1000.0000 mg | Freq: Once | INTRAVENOUS | Status: AC
  Administered 2011-12-24: 1000 mg via INTRAVENOUS
  Filled 2011-12-24: qty 200

## 2011-12-24 MED ORDER — METOPROLOL TARTRATE 25 MG PO TABS
25.0000 mg | ORAL_TABLET | Freq: Two times a day (BID) | ORAL | Status: DC
  Administered 2011-12-25: 25 mg via ORAL
  Filled 2011-12-24 (×5): qty 1

## 2011-12-24 MED ORDER — BISACODYL 5 MG PO TBEC
10.0000 mg | DELAYED_RELEASE_TABLET | Freq: Every day | ORAL | Status: DC
  Administered 2011-12-25 – 2011-12-27 (×3): 10 mg via ORAL
  Filled 2011-12-24 (×3): qty 2

## 2011-12-24 SURGICAL SUPPLY — 124 items
ADAPTER CARDIO PERF ANTE/RETRO (ADAPTER) ×2 IMPLANT
ADH SKN CLS APL DERMABOND .7 (GAUZE/BANDAGES/DRESSINGS) ×3
ADPR PRFSN 84XANTGRD RTRGD (ADAPTER) ×1
APL SKNCLS STERI-STRIP NONHPOA (GAUZE/BANDAGES/DRESSINGS) ×1
BAG DECANTER FOR FLEXI CONT (MISCELLANEOUS) ×1 IMPLANT
BENZOIN TINCTURE PRP APPL 2/3 (GAUZE/BANDAGES/DRESSINGS) ×2 IMPLANT
BLADE SURG 11 STRL SS (BLADE) ×2 IMPLANT
CANISTER SUCTION 2500CC (MISCELLANEOUS) ×4 IMPLANT
CANNULA FEM VENOUS REMOTE 22FR (CANNULA) ×1 IMPLANT
CANNULA FEMORAL ART 14 SM (MISCELLANEOUS) ×2 IMPLANT
CANNULA GUNDRY RCSP 15FR (MISCELLANEOUS) ×2 IMPLANT
CANNULA OPTISITE PERFUSION 16F (CANNULA) IMPLANT
CANNULA OPTISITE PERFUSION 18F (CANNULA) ×1 IMPLANT
CARDIAC SUCTION (MISCELLANEOUS) ×2 IMPLANT
CATH KIT ON Q 5IN SLV (PAIN MANAGEMENT) IMPLANT
CATH ROBINSON RED A/P 18FR (CATHETERS) ×1 IMPLANT
CLOTH BEACON ORANGE TIMEOUT ST (SAFETY) ×2 IMPLANT
CONN ST 1/4X3/8  BEN (MISCELLANEOUS) ×2
CONN ST 1/4X3/8 BEN (MISCELLANEOUS) ×2 IMPLANT
CONT SPEC 4OZ CLIKSEAL STRL BL (MISCELLANEOUS) ×1 IMPLANT
CONT SPEC STER OR (MISCELLANEOUS) ×2 IMPLANT
COVER MAYO STAND STRL (DRAPES) ×2 IMPLANT
COVER PROBE W GEL 5X96 (DRAPES) ×1 IMPLANT
COVER SURGICAL LIGHT HANDLE (MISCELLANEOUS) ×4 IMPLANT
CRADLE DONUT ADULT HEAD (MISCELLANEOUS) ×2 IMPLANT
DERMABOND ADVANCED (GAUZE/BANDAGES/DRESSINGS) ×3
DERMABOND ADVANCED .7 DNX12 (GAUZE/BANDAGES/DRESSINGS) ×2 IMPLANT
DEVICE PMI PUNCTURE CLOSURE (MISCELLANEOUS) ×2 IMPLANT
DEVICE TROCAR PUNCTURE CLOSURE (ENDOMECHANICALS) ×2 IMPLANT
DRAIN CHANNEL 28F RND 3/8 FF (WOUND CARE) ×4 IMPLANT
DRAPE BILATERAL SPLIT (DRAPES) ×2 IMPLANT
DRAPE C-ARM 42X72 X-RAY (DRAPES) ×2 IMPLANT
DRAPE CV SPLIT W-CLR ANES SCRN (DRAPES) ×2 IMPLANT
DRAPE INCISE IOBAN 66X45 STRL (DRAPES) ×4 IMPLANT
DRAPE SLUSH MACHINE 52X66 (DRAPES) IMPLANT
DRAPE SLUSH/WARMER DISC (DRAPES) IMPLANT
DRSG COVADERM 4X8 (GAUZE/BANDAGES/DRESSINGS) ×2 IMPLANT
ELECT BLADE 6.5 EXT (BLADE) ×2 IMPLANT
ELECT REM PT RETURN 9FT ADLT (ELECTROSURGICAL) ×4
ELECTRODE REM PT RTRN 9FT ADLT (ELECTROSURGICAL) ×2 IMPLANT
FEMORAL VENOUS CANN RAP (CANNULA) IMPLANT
GLOVE BIO SURGEON STRL SZ 6 (GLOVE) ×3 IMPLANT
GLOVE BIO SURGEON STRL SZ 6.5 (GLOVE) ×3 IMPLANT
GLOVE BIO SURGEON STRL SZ7 (GLOVE) ×2 IMPLANT
GLOVE BIO SURGEON STRL SZ8.5 (GLOVE) ×2 IMPLANT
GLOVE ORTHO TXT STRL SZ7.5 (GLOVE) ×6 IMPLANT
GOWN STRL NON-REIN LRG LVL3 (GOWN DISPOSABLE) ×10 IMPLANT
GRAFT PTCH CORMATRIX 7X10 4PLY (Prosthesis & Implant Heart) ×1 IMPLANT
GUIDEWIRE ANG ZIPWIRE 038X150 (WIRE) ×2 IMPLANT
INSERT CONFORM CROSS CLAMP 66M (MISCELLANEOUS) ×1 IMPLANT
INSERT CONFORM CROSS CLAMP 86M (MISCELLANEOUS) ×1 IMPLANT
KIT BASIN OR (CUSTOM PROCEDURE TRAY) ×2 IMPLANT
KIT DILATOR VASC 18G NDL (KITS) ×2 IMPLANT
KIT DRAINAGE VACCUM ASSIST (KITS) ×1 IMPLANT
KIT ROOM TURNOVER OR (KITS) ×2 IMPLANT
KIT SUCTION CATH 14FR (SUCTIONS) ×3 IMPLANT
LEAD PACING MYOCARDI (MISCELLANEOUS) ×2 IMPLANT
LINE VENT (MISCELLANEOUS) ×1 IMPLANT
NDL AORTIC ROOT 14G 7F (CATHETERS) ×1 IMPLANT
NEEDLE AORTIC ROOT 14G 7F (CATHETERS) ×2 IMPLANT
NS IRRIG 1000ML POUR BTL (IV SOLUTION) ×6 IMPLANT
PACK OPEN HEART (CUSTOM PROCEDURE TRAY) ×2 IMPLANT
PAD ARMBOARD 7.5X6 YLW CONV (MISCELLANEOUS) ×4 IMPLANT
PAD ELECT DEFIB RADIOL ZOLL (MISCELLANEOUS) ×2 IMPLANT
RETRACTOR TRL SOFT TISSUE LG (INSTRUMENTS) ×1 IMPLANT
RETRACTOR TRM SOFT TISSUE 7.5 (INSTRUMENTS) IMPLANT
SET CANNULATION TOURNIQUET (MISCELLANEOUS) ×2 IMPLANT
SET CARDIOPLEGIA MPS 5001102 (MISCELLANEOUS) ×1 IMPLANT
SET IRRIG TUBING LAPAROSCOPIC (IRRIGATION / IRRIGATOR) ×2 IMPLANT
SOLUTION ANTI FOG 6CC (MISCELLANEOUS) ×3 IMPLANT
SPONGE GAUZE 4X4 12PLY (GAUZE/BANDAGES/DRESSINGS) ×2 IMPLANT
SUCKER WEIGHTED FLEX (MISCELLANEOUS) ×4 IMPLANT
SUT BONE WAX W31G (SUTURE) ×3 IMPLANT
SUT E-PACK MINIMALLY INVASIVE (SUTURE) ×2 IMPLANT
SUT ETHIBOND (SUTURE) IMPLANT
SUT ETHIBOND 2 0 SH (SUTURE) IMPLANT
SUT ETHIBOND 2 0 V4 (SUTURE) IMPLANT
SUT ETHIBOND 2 0V4 GREEN (SUTURE) IMPLANT
SUT ETHIBOND 2-0 RB-1 WHT (SUTURE) IMPLANT
SUT ETHIBOND 4 0 TF (SUTURE) IMPLANT
SUT ETHIBOND 5 0 C 1 30 (SUTURE) IMPLANT
SUT ETHIBOND NAB MH 2-0 36IN (SUTURE) IMPLANT
SUT ETHIBOND X763 2 0 SH 1 (SUTURE) ×2 IMPLANT
SUT GORETEX 6.0 TH-9 30 IN (SUTURE) IMPLANT
SUT GORETEX CV 4 TH 22 36 (SUTURE) ×2 IMPLANT
SUT GORETEX CV-5THC-13 36IN (SUTURE) IMPLANT
SUT GORETEX CV4 TH-18 (SUTURE) ×6 IMPLANT
SUT GORETEX TH-18 36 INCH (SUTURE) IMPLANT
SUT MNCRL AB 3-0 PS2 18 (SUTURE) IMPLANT
SUT PROLENE 3 0 SH DA (SUTURE) ×2 IMPLANT
SUT PROLENE 3 0 SH1 36 (SUTURE) ×4 IMPLANT
SUT PROLENE 4 0 RB 1 (SUTURE) ×14
SUT PROLENE 4-0 RB1 .5 CRCL 36 (SUTURE) IMPLANT
SUT PROLENE 5 0 C 1 36 (SUTURE) IMPLANT
SUT PROLENE 6 0 C 1 30 (SUTURE) ×1 IMPLANT
SUT SILK  1 MH (SUTURE)
SUT SILK 1 MH (SUTURE) IMPLANT
SUT SILK 1 TIES 10X30 (SUTURE) IMPLANT
SUT SILK 2 0 SH CR/8 (SUTURE) IMPLANT
SUT SILK 2 0 TIES 10X30 (SUTURE) IMPLANT
SUT SILK 2 0SH CR/8 30 (SUTURE) IMPLANT
SUT SILK 3 0 (SUTURE)
SUT SILK 3 0 SH CR/8 (SUTURE) IMPLANT
SUT SILK 3 0SH CR/8 30 (SUTURE) IMPLANT
SUT SILK 3-0 18XBRD TIE 12 (SUTURE) IMPLANT
SUT TEM PAC WIRE 2 0 SH (SUTURE) IMPLANT
SUT VIC AB 2-0 CTX 27 (SUTURE) ×1 IMPLANT
SUT VIC AB 2-0 CTX 36 (SUTURE) IMPLANT
SUT VIC AB 2-0 UR6 27 (SUTURE) IMPLANT
SUT VIC AB 3-0 SH 8-18 (SUTURE) ×1 IMPLANT
SUT VICRYL 2 TP 1 (SUTURE) IMPLANT
SYRINGE 10CC LL (SYRINGE) ×2 IMPLANT
SYSTEM SAHARA CHEST DRAIN ATS (WOUND CARE) ×2 IMPLANT
TAPE CLOTH SURG 4X10 WHT LF (GAUZE/BANDAGES/DRESSINGS) ×1 IMPLANT
TOWEL OR 17X24 6PK STRL BLUE (TOWEL DISPOSABLE) ×2 IMPLANT
TOWEL OR 17X26 10 PK STRL BLUE (TOWEL DISPOSABLE) ×3 IMPLANT
TRAY FOLEY IC TEMP SENS 14FR (CATHETERS) ×2 IMPLANT
TROCAR XCEL BLADELESS 5X75MML (TROCAR) ×2 IMPLANT
TROCAR XCEL NON-BLD 11X100MML (ENDOMECHANICALS) ×4 IMPLANT
TUBE SUCT INTRACARD DLP 20F (MISCELLANEOUS) ×2 IMPLANT
TUNNELER SHEATH ON-Q 11GX8 (MISCELLANEOUS) IMPLANT
UNDERPAD 30X30 INCONTINENT (UNDERPADS AND DIAPERS) ×2 IMPLANT
WATER STERILE IRR 1000ML POUR (IV SOLUTION) ×4 IMPLANT
WIRE BENTSON .035X145CM (WIRE) ×2 IMPLANT

## 2011-12-24 NOTE — Progress Notes (Signed)
Pt on CPAP/PS 5/10. RR 28-32, with VTs approximately 350, VE 10. NIF -20, FVC 300-400cc at this time.

## 2011-12-24 NOTE — OR Nursing (Signed)
0.625% Glutaraldehyde used to soak piece of pericardium per Dr. Cornelius Moras.  [facility] policy and procedures followed.

## 2011-12-24 NOTE — Progress Notes (Signed)
Subjective: The patient is POD#0 s/p excision of left atrial myxoma.  The patient tolerated the procedure well, currently recovering in the ICU, with stable vitals.  Dr. Cornelius Moras will take over as primary, we will continue to consult for hypoglycemia.  Patient's cbg's with a nadir of 87 this morning, likely secondary to NPO status.  Objective: Vital signs in last 24 hours: Filed Vitals:   12/24/11 1430 12/24/11 1445 12/24/11 1500 12/24/11 1515  BP:  105/69 101/72   Pulse: 80 79 89 89  Temp:  96.1 F (35.6 C) 95.9 F (35.5 C) 96.1 F (35.6 C)  TempSrc:  Core (Comment) Core (Comment) Core (Comment)  Resp: 18 13 17 12   Height: 5\' 3"  (1.6 m)     Weight: 201 lb 8 oz (91.4 kg)     SpO2: 100% 99% 99% 100%   Weight change:   Intake/Output Summary (Last 24 hours) at 12/24/11 1529 Last data filed at 12/24/11 1500  Gross per 24 hour  Intake 3435.5 ml  Output   1540 ml  Net 1895.5 ml   Physical Exam: General: sedated, intubated HEENT: pupils equal round and reactive to light, intubated Neck: no lymphadenopathy Lungs: clear to ascultation bilaterally, mechanical breath sounds Heart: regular rate and rhythm, no murmurs, gallops, or rubs Abdomen: soft, non-tender, non-distended, normal bowel sounds Extremities: no cyanosis, clubbing, or edema Neurologic: sedated, intubated  Lab Results: Basic Metabolic Panel:  Lab 12/24/11 1610 12/24/11 1257 12/23/11 0615 12/22/11 0600 12/19/11 0500  NA 143 143 -- -- --  K 3.7 3.8 -- -- --  CL -- -- 107 103 --  CO2 -- -- 24 24 --  GLUCOSE 111* 117* -- -- --  BUN -- -- 16 18 --  CREATININE -- -- 0.63 0.65 --  CALCIUM -- -- 8.9 9.1 --  MG -- -- -- -- 1.9  PHOS -- -- -- -- 3.9   Liver Function Tests:  Lab 12/20/11 1818 12/18/11 0303  AST 32 25  ALT 30 21  ALKPHOS 54 54  BILITOT 0.2* 0.4  PROT 6.5 6.1  ALBUMIN 2.4* 2.5*   CBC:  Lab 12/24/11 1436 12/24/11 1435 12/24/11 1150 12/23/11 0615 12/18/11 0303  WBC -- 23.9* -- 8.5 --  NEUTROABS --  -- -- -- 5.7  HGB 9.5* 9.1* -- -- --  HCT 28.0* 28.0* -- -- --  MCV -- 94.3 -- 94.6 --  PLT -- 232 215 -- --   CBG:  Lab 12/24/11 0528 12/23/11 2347 12/23/11 2005 12/23/11 1725 12/23/11 1114 12/23/11 0735  GLUCAP 89 146* 103* 74 130* 95   Hemoglobin A1C:  Lab 12/20/11 1818  HGBA1C 5.1   Coagulation:  Lab 12/24/11 1435 12/20/11 1818 12/17/11 1906  LABPROT 17.7* 13.9 13.8  INR 1.43 1.05 1.04   Misc. Labs:  Parathyroid hormone: Pending  Proinsulin/Insulin Level: Pending  C-Peptide: 4.17 (H)  AM Cortisol: 10.9  Sed Rate: 46  ANA: Neg  Anti-DNA: 1 (Neg)  RF: <10  C3: 118  C4: 51  CRP: 4.85  ANCA: Neg  MPO/PR-3: <1   Micro Results: 4/20 - 1/2 blood cultures with coag-neg staph 4/23 - blood cultures negative  Studies/Results: Mr Abdomen W Wo Contrast  12/22/2011  *RADIOLOGY REPORT*  Clinical Data: Hypoglycemia. Evaluate for insulinoma.  MRI ABDOMEN WITH AND WITHOUT CONTRAST  Technique:  Multiplanar multisequence MR imaging of the abdomen was performed both before and after administration of intravenous contrast.  Contrast: 20mL MULTIHANCE GADOBENATE DIMEGLUMINE 529 MG/ML IV SOLN  Comparison: None.  Findings:  Diminished exam detail due to motion artifact.  Pancreas appears normal.  No mass identified.  The spleen is within normal limits.  Within the right hepatic lobe there is a focal area of mild T2 hyperintensity which measures 2 cm.  This area was not covered on the T1-weighted images and the images performed with contrast material.  The remaining visualized portions of the liver appear within normal limits.  The gallbladder appears normal.  There is no biliary dilatation.  Small T2 hyperintensity within the mid pole of the left kidney measures 8 mm.  This is difficult to characterize on the postcontrast images.  No obvious enhancement is noted.  No upper abdominal adenopathy.  There is no upper abdominal ascites.  The visualized bony structures appear normal.  Large lipoma is  identified within the right lateral abdominal wall.  IMPRESSION:  1.  Diminished exam detail due to respiratory motion artifact. 2.  No pancreatic mass identified. 3.  Indeterminant T2 intense structure within the right hepatic lobe.  Not imaged on the T1-weighted images were the postcontrast images.  Because of the patient's difficulty with breath-hold I would advise a three-phase liver imaging with contrast enhanced CT of the abdomen for more definitive assessment.  Original Report Authenticated By: Rosealee Albee, M.D.   Dg Chest Portable 1 View  12/24/2011  *RADIOLOGY REPORT*  Clinical Data: Post op  PORTABLE CHEST - 1 VIEW  Comparison: Portable exam 1452 hours compared to 12/20/2011  Findings: Tip of endotracheal tube approximately 3 mm above carina directed into the right mainstem bronchus, recommend withdrawal 2 cm. Mild enlargement of cardiac silhouette. Nasogastric tube extends into stomach. Left jugular central venous catheter, tip projecting over left mediastinum and superior aspect of aortic arch, unable to confirm position in this setting; recommend clinical confirmation of intravenous position. Pair of right thoracostomy tubes. Bibasilar atelectasis greater on right. No gross pleural effusion or pneumothorax. Question minimal perihilar edema.  IMPRESSION: Bibasilar atelectasis, greater on the right. Question minimal perihilar edema. Tip of endotracheal tube is 3 mm from carina directed into the right mainstem bronchus, recommend withdrawal 2 cm. Recommend clinical confirmation of the position of the left jugular catheter as above.  Position of ET tube and recommendation for repositioning called to [facility] on 2300 Unit at 1506 hours on 12/24/2011.  Original Report Authenticated By: Lollie Marrow, M.D.   Medications: I have reviewed the patient's current medications. Scheduled Meds:    . acetaminophen (TYLENOL) oral liquid 160 mg/5 mL  650 mg Per Tube NOW   Or  . acetaminophen  650 mg  Rectal NOW  . acetaminophen  1,000 mg Oral Q6H   Or  . acetaminophen (TYLENOL) oral liquid 160 mg/5 mL  975 mg Per Tube Q6H  . albuterol  2.5 mg Nebulization Once  . aspirin EC  325 mg Oral Daily   Or  . aspirin  324 mg Per Tube Daily  . bisacodyl  10 mg Oral Daily   Or  . bisacodyl  10 mg Rectal Daily  . cefUROXime (ZINACEF)  IV  1.5 g Intravenous To OR  . cefUROXime (ZINACEF)  IV  1.5 g Intravenous Q12H  . chlorhexidine  60 mL Topical Once  . chlorhexidine  60 mL Topical Once  . chlorhexidine  60 mL Topical Once  . dexmedetomidine (PRECEDEX) IV infusion for high rates  0.1-0.7 mcg/kg/hr Intravenous To OR  . docusate sodium  200 mg Oral Daily  . famotidine (PEPCID) IV  20  mg Intravenous Q12H  . hydrocortisone sodium succinate  50 mg Intravenous Q8H  . insulin (NOVOLIN-R) infusion   Intravenous To OR  . insulin regular  0-10 Units Intravenous TID WC  . magnesium sulfate  4 g Intravenous Once  . metoprolol tartrate  12.5 mg Oral BID   Or  . metoprolol tartrate  12.5 mg Per Tube BID  . nitroGLYCERIN  2-200 mcg/min Intravenous To OR  . pantoprazole  40 mg Oral Q1200  . potassium chloride  10 mEq Intravenous Q1 Hr x 3  . potassium chloride  10 mEq Intravenous Once  . sodium chloride  3 mL Intravenous Q12H  . tranexamic acid  15 mg/kg Intravenous To OR  . tranexamic acid (CYKLOKAPRON) infusion ([facility])  1.5 mg/kg/hr Intravenous To OR  . vancomycin  1,500 mg Intravenous To OR  . vancomycin  1,000 mg Intravenous Once  . DISCONTD: amiodarone  200 mg Oral BID PC  . DISCONTD: antiseptic oral rinse  15 mL Mouth Rinse BID  . DISCONTD: aspirin EC  81 mg Oral Daily  . DISCONTD: cefUROXime (ZINACEF)  IV  750 mg Intravenous To OR  . DISCONTD: ciprofloxacin  750 mg Oral BID  . DISCONTD: dexmedetomidine (PRECEDEX) IV infusion for high rates  0.1-0.7 mcg/kg/hr Intravenous To OR  . DISCONTD: DOPamine  2-20 mcg/kg/min Intravenous To OR  . DISCONTD: epinephrine  0.5-20 mcg/min Intravenous To OR  .  DISCONTD: glutaraldehyde   Topical To OR  . DISCONTD: heparin subcutaneous  5,000 Units Subcutaneous Q8H  . DISCONTD: hydrocortisone sod succinate (SOLU-CORTEF) injection  50 mg Intravenous Q12H  . DISCONTD: magnesium sulfate  40 mEq Other To OR  . DISCONTD: metoprolol tartrate  25 mg Oral BID  . DISCONTD: nitroglycerin-nicardipine-HEPARIN-sodium bicarbonate irrigation for artery spasm   Irrigation To OR  . DISCONTD: phenylephrine (NEO-SYNEPHRINE) Adult infusion  30-200 mcg/min Intravenous To OR  . DISCONTD: potassium chloride  80 mEq Other To OR  . DISCONTD: tranexamic acid (CYKLOKAPRON) infusion ([facility])  1.5 mg/kg/hr Intravenous To OR  . DISCONTD: tranexamic acid  2 mg/kg Intracatheter To OR   Continuous Infusions:    . sodium chloride 20 mL/hr at 12/24/11 1500  . sodium chloride    . sodium chloride    . amiodarone (NEXTERONE PREMIX) 360 mg/200 mL dextrose    . dexmedetomidine (PRECEDEX) IV infusion 0.7 mcg/kg/hr (12/24/11 1500)  . insulin (NOVOLIN-R) infusion 1 Units/hr (12/24/11 1500)  . lactated ringers 20 mL/hr (12/24/11 1500)  . nitroGLYCERIN Stopped (12/24/11 1500)  . phenylephrine (NEO-SYNEPHRINE) Adult infusion 10 mcg/min (12/24/11 1500)   PRN Meds:.albumin human, calcium chloride, metoprolol, midazolam, morphine injection, morphine, ondansetron (ZOFRAN) IV, oxyCODONE, sodium chloride, temazepam, DISCONTD: 0.9 % irrigation (POUR BTL), DISCONTD: acetaminophen, DISCONTD: acetaminophen, DISCONTD: dextrose, DISCONTD: ondansetron (ZOFRAN) IV, DISCONTD: ondansetron (ZOFRAN) IV  Assessment/Plan: The patient is a 61 yo woman, no prior significant medical history, presenting with weakness and dysphagia, found to have an acute CVA, likely secondary to atrial myxoma.  # Left atrial myxoma - POD #0 s/p resection -CVTS primary -awaiting pathology to determine etiology  # Recurrent hypoglycemia - Patient presented with hypoglycemia, which persisted during her first few days of  hospitalization.  D5-NS drip discontinued 4/29.  Patient's cbg's currently well-controlled.  Elevated C-peptide, elevated proinsulin, normal insulin levels.  Differential includes insulinoma vs insulin autoimmune hypoglycemia vs infectious etiology (with myxoma as possible source).  CT and MRI abd showed no lesions (though MRI of poor quality due to respiratory motion).  GI consulted, considered EUS  for insulinoma, but per CVTS not stable for procedure for several months post-op. -awaiting insulin and insulin receptor antibodies -continue to monitor cbg's, now that patient is off of D5-NS and steroids  # Acute CVA - likely represents thrombus from atrial myxoma, also considering L ICA dissection (given CT neck results). -continue aspirin 81 daily -perioperative heparin drip -continue PT, OT, SLP, to improve patient's RUE weakness, and R CN VII function -will obtain CIR consult when patient recovers from surgery for further stroke rehab  # Abnormal blood culture result - 1/2 blood cultures from 4/20 showing coag-neg staph, initially concerning for bacteremia, treated with vanc/ceftriaxone.  However, blood cultures 4/23 negative, so staph likely contaminant. -vanc/ceftriaxone discontinued 4/29  # Prophy - SCD's   LOS: 10 days   Janalyn Harder 12/24/2011, 3:29 PM

## 2011-12-24 NOTE — Progress Notes (Signed)
  Echocardiogram Echocardiogram Transesophageal has been performed.  Courtney Faulkner 12/24/2011, 2:12 PM

## 2011-12-24 NOTE — Procedures (Addendum)
Extubation Procedure Note  Patient Details:   Name: Courtney Faulkner DOB: [birth year] MRN: [id number]     Evaluation  O2 sats: stable throughout Complications: No apparent complications Patient did tolerate procedure well. Bilateral Breath Sounds: Rhonchi Suctioning: Airway   NIF-20, FVC 300-400, Positive air leak around ETT. ABG WNL. MD order to extubate pt. No stridor present at this time. Pt placed on 3L [place] tolerating well SpO2 100%.  Fredrich Birks 12/24/2011, 10:10 PM

## 2011-12-24 NOTE — Op Note (Addendum)
CARDIOTHORACIC SURGERY OPERATIVE NOTE  Date of Procedure:  12/24/2011  Preoperative Diagnosis: Left Atrial Myxoma  Postoperative Diagnosis: Same  Procedure:   Minimally-Invasive Excision of Left Atrial Myxoma   Surgeon:   Salvatore Decent. 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 She also had fluctuating neuro status with periodic hypoglycemia and is also being worked up for an insulinoma/neuroendocrine tumor. Workup thus far has also showed occluded LICA as well as a left atrial mass on both transthoracic and transesophageal echocardiogram consistent with left atrial myxoma.  Cardiac catheterization revealed no significant coronary artery disease.  The patient was seen in consultation initially by Dr. Tyrone Sage, Dr. Katrinka Blazing from the cardiology service, Dr. Pearlean Brownie from the neurology service, and Dr. Myra Gianotti from the vascular surgery service.  It was recommended that no further workup nor surgical intervention was indicated regarding the patient's left internal carotid artery occlusion. Timing of surgical intervention for the patient's atrial myxoma was discussed at length and it was agreed that no further delay was necessary. The patient made substantial progress with recovery from the stroke. The patient and her husband were counseled at length regarding the indications, risks, and potential benefits of surgery alternative treatment strategies were discussed. Alternative surgical approaches were discussed. They were counseled at length regarding the indications, risks and potential benefits of surgery.  All  questions have been answered, and the patient and her husband provide full informed consent for the operation as described.    DETAILS OF THE OPERATIVE PROCEDURE  The patient is brought to the operating room on the above mentioned date and central monitoring was established by the anesthesia team including placement of Swan-Ganz catheter through the left internal jugular vein.  A radial arterial line is placed. The patient is placed in the supine position on the operating table.  Intravenous antibiotics are administered. General endotracheal anesthesia is induced uneventfully. The patient is initially intubated using a dual lumen endotracheal tube.  A Foley catheter is placed.  Baseline transesophageal echocardiogram was performed.  Findings were notable for 1.5 x 3.5 cm left atrial mass attached to the interatrial septum.  Left ventricular size and function was normal.  There were no other abnormalities.  A soft roll is placed behind the patient's left scapula and the neck gently extended and turned to the left.   The patient's right neck, chest, abdomen, both groins, and both lower extremities are prepared and draped in a sterile manner. A time out procedure is performed.  A small incision is made in the right inguinal crease and the anterior surface of the right common femoral artery and right common femoral vein are identified.  A right miniature anterolateral thoracotomy incision is performed. The incision is placed just lateral to and superior to the right nipple. The pectoralis major muscle is retracted medially and completely preserved. The right pleural space is entered through the 3rd intercostal space. A soft tissue retractor is placed.  Two 11 mm ports are placed through separate stab incisions inferiorly. The right pleural space is insufflated continuously with carbon dioxide gas through the posterior  port during the remainder of the operation.  A pledgeted sutures placed through the dome  of the right hemidiaphragm and retracted inferiorly to facilitate exposure.  A longitudinal incision is made in the pericardium 3 cm anterior to the phrenic nerve and silk traction sutures are placed on either side of the incision for exposure.  The patient is placed in Trendelenburg position. The right internal jugular vein is cannulated with Seldinger technique and a guidewire advanced into the right atrium. The patient is heparinized systemically. The right internal jugular vein is cannulated with a 14 [place] pediatric femoral venous cannula. Pursestring sutures are placed on the anterior surface of the right common femoral vein and right common femoral artery. The right common femoral vein is cannulated with the Seldinger technique and a guidewire is advanced under transesophageal echocardiogram guidance through the right atrium. The femoral vein is cannulated with a long 22 French femoral venous cannula. The right common femoral artery is cannulated with Seldinger technique and a flexible guidewire is advanced until it can be appreciated intraluminally in the descending thoracic aorta on transesophageal echocardiogram. The femoral artery is cannulated with an 18 French femoral arterial cannula.  Adequate heparinization is verified.      The entire pre-bypass portion of the operation was notable for stable hemodynamics.  Cardiopulmonary bypass was begun.  Vacuum assist venous drainage is utilized. The incision in the pericardium is extended in both directions. Venous drainage and exposure are notably excellent. A portion of the patient's pericardium was removed and soaked in gluteraldehyde solution for 3 minutes followed by a series of rinse baths per protocol.  An antegrade cardioplegia cannula is placed in the ascending aorta.    The patient is cooled to 28C systemic temperature.  The aortic cross clamp is applied and cold blood cardioplegia is delivered initially in an antegrade fashion through the  aortic root.  The initial cardioplegic arrest is rapid with early diastolic arrest.  Repeat doses of cardioplegia are administered intermittently every 20 to 30 minutes throughout the entire cross clamp portion of the operation through the aortic root in order to maintain completely flat electrocardiogram.  Myocardial protection was felt to be excellent.  A left atriotomy incision was performed through the interatrial groove.  Immediately upon entry of the left atrium one could appreciate a dark gelatinous appearing mass emanating from the intra-atrial septum consistent with a classical appearance of a left atrial myxoma. A sump drain was carefully placed behind the mass into the left inferior pulmonary vein. Umbilical tapes were secured around the superior vena cava and the inferior vena cava. An oblique right atriotomy incision is performed. A right angle clamp is placed under direct vision through the incision in the left atrium to mark the intra-atrial septum several millimeters away from the base of the stalk of the mass. An incision is made through the intra-atrial septum at this level. The mass is then excised by completing a circular incision around the base of the mass to remove the entire mass and the majority of the floor of the fossa ovalis. The mass was carefully removed from the patient. The left atrium is carefully examined to make sure that no portions of the mass had broken loose and the left atrium is irrigated with copious saline solution.  The circular defect in the intra-atrial septum is closed using a patch of the patient's autologous pericardium which has been soaked in gluteraldehyde solution. The patch is trimmed to an appropriate size and shape and sewn in  place with running 4-0 Prolene suture.  The left atriotomy was closed using a 2-layer closure of running 3-0 Prolene suture after placing a sump drain across the mitral valve to serve as a left ventricular vent.  One final dose of  warm antegrade "hot shot" cardioplegia was administered through the aortic root.  The aortic cross clamp was removed after a total cross clamp time of 68 minutes.  The right atriotomy incision was closed using a 2-layer closure of running 4-0 Prolene suture.  Epicardial pacing wires are fixed to the inferior wall of the right ventricule and to the right atrial appendage. The patient is rewarmed to 37C temperature. The left ventricular vent is removed.  The patient is ventilated and flow volumes turndown while the mitral valve repair is inspected using transesophageal echocardiogram. The valve repair appears intact with no residual leak. The antegrade cardioplegia cannula is now removed. The patient is weaned and disconnected from cardiopulmonary bypass.  The patient's rhythm at separation from bypass was sinus.  The patient was weaned from bypass without any inotropic support. Total cardiopulmonary bypass time for the operation was 113 minutes.  Followup transesophageal echocardiogram performed after separation from bypass revealed no sign of any residual mass nor any flow across the interatrial septum.  Left ventricular function was unchanged from preoperatively.   The femoral arterial and venous cannulae were removed uneventfully. There was a palpable pulse in the distal right common femoral artery after removal of the cannula. Protamine was administered to reverse the anticoagulation. The right internal jugular cannula was removed and manual pressure held on the neck for 15 minutes.  Single lung ventilation was begun. The atriotomy closure was inspected for hemostasis. The pericardial sac was drained using a 28 French Bard drain placed through the anterior port incision.  The pericardium was closed using a patch of core matrix bovine submucosal tissue patch. The right pleural space is irrigated with saline solution and inspected for hemostasis. The right pleural space was drained using a 28 French Bard  drain placed through the posterior port incision. The miniature thoracotomy incision was closed in multiple layers in routine fashion. The right groin incision was inspected for hemostasis and closed in multiple layers in routine fashion.  The patient tolerated the procedure well.  The patient was reintubated using a single lumen endotracheal tube and subsequently transported to the surgical intensive care unit in stable condition. There were no intraoperative complications. All sponge instrument and needle counts are verified correct at completion of the operation.   The post-bypass portion of the operation was notable for stable rhythm and hemodynamics.   No blood products were administered during the operation.   Salvatore Decent. Cornelius Moras MD 12/24/2011 2:51 PM

## 2011-12-24 NOTE — Progress Notes (Signed)
Patient in OR. Will follow up tomorrow.   Joaquin Music, ANP-BC, GNP-BC [facility] Pager: [phone] 12/24/2011 9:50 AM  Dr. Delia Heady, Stroke Center Medical Director, has personally reviewed chart, pertinent data, examined the patient and developed the plan of care. Pager:  [phone]

## 2011-12-24 NOTE — Preoperative (Signed)
Beta Blockers   Reason not to administer Beta Blockers:Not Applicable 

## 2011-12-24 NOTE — Anesthesia Preprocedure Evaluation (Addendum)
Anesthesia Evaluation  Patient identified by MRN, date of birth, ID band Patient awake    Reviewed: Allergy & Precautions, H&P , NPO status , Patient's Chart, lab work & pertinent test results, reviewed documented beta blocker date and time   History of Anesthesia Complications Negative for: history of anesthetic complications  Airway Mallampati: II TM Distance: >3 FB Neck ROM: Full    Dental  (+) Teeth Intact and Dental Advisory Given   Pulmonary neg pulmonary ROS, Recent URI  (albuterol PRN ), Resolved,   Reports no respiratory issues.  breath sounds clear to auscultation        Cardiovascular Exercise Tolerance: Poor Rhythm:Regular Rate:Normal  Atrial myxoma. EF 60-65%. Metoprolol 4/29 2215.    Neuro/Psych R sided weakness, difficulty finding words.  CVA, Residual Symptoms negative psych ROS   GI/Hepatic negative GI ROS, Neg liver ROS,   Endo/Other  negative endocrine ROS  Renal/GU negative Renal ROS  negative genitourinary   Musculoskeletal negative musculoskeletal ROS (+)   Abdominal   Peds negative pediatric ROS (+)  Hematology negative hematology ROS (+)   Anesthesia Other Findings   Reproductive/Obstetrics negative OB ROS                       Anesthesia Physical Anesthesia Plan  ASA: III  Anesthesia Plan: General   Post-op Pain Management:    Induction: Intravenous  Airway Management Planned: Oral ETT  Additional Equipment: Arterial line, CVP, PA Cath, TEE, Ultrasound Guidance Line Placement and 3D TEE  Intra-op Plan:   Post-operative Plan: Post-operative intubation/ventilation  Informed Consent: I have reviewed the patients History and Physical, chart, labs and discussed the procedure including the risks, benefits and alternatives for the proposed anesthesia with the patient or authorized representative who has indicated his/her understanding and acceptance.   Dental  advisory given  Plan Discussed with: CRNA and Anesthesiologist  Anesthesia Plan Comments:         Anesthesia Quick Evaluation

## 2011-12-24 NOTE — Plan of Care (Signed)
Problem: Phase II Progression Outcomes Goal: Patient extubated within - 6-12 hours d/t right sided weakness, lethargy

## 2011-12-24 NOTE — OR Nursing (Signed)
13:00pm - call to vol. Desk to inform family off pump, 1st call made to SICU.  2nd call made to SICU @ 13:45pm

## 2011-12-24 NOTE — Brief Op Note (Addendum)
                   [address]            [place] [address]          [phone]    12/14/2011 - 12/24/2011  12:17 PM  PATIENT:  Courtney Faulkner  61 y.o. female  PRE-OPERATIVE DIAGNOSIS:  atrial myxoma  POST-OPERATIVE DIAGNOSIS:  atrial myxoma  PROCEDURE:  Procedure(s): MINIMALLY INVASIVE EXCISION OF ATRIAL MYXOMA  SURGEON:    Purcell Nails, MD  ASSISTANTS:  Rowe Clack, PA-C  ANESTHESIA:   Zenon Mayo, MD  CROSSCLAMP TIME:   9' CARDIOPULMONARY BYPASS TIME: 113'  FINDINGS:  Typical appearance of left atrial myxoma attached to interatrial septum  Normal LV function  COMPLICATIONS: none  PATIENT DISPOSITION:   TO SICU IN STABLE CONDITION  Donyetta Ogletree H 12/24/2011 1:49 PM

## 2011-12-24 NOTE — Progress Notes (Signed)
Patient ID: Courtney Faulkner, female   DOB: 1951-04-19, 61 y.o.   MRN: [id number] S/p resection of left atrial myxoma BP 94/67  Pulse 90  Temp(Src) 98.2 F (36.8 C) (Core (Comment))  Resp 14  Ht 5\' 3"  (1.6 m)  Wt 201 lb 8 oz (91.4 kg)  BMI 35.69 kg/m2  SpO2 100% Initial labs OK WBC elevated at 23K Minimal CT output  Stable early postop

## 2011-12-24 NOTE — Anesthesia Postprocedure Evaluation (Signed)
  Anesthesia Post-op Note  Patient: Courtney Faulkner  Procedure(s) Performed: Procedure(s) (LRB): MINIMALLY INVASIVE EXCISION OF ATRIAL MYXOMA (N/A)  Patient Location: ICU  Anesthesia Type: General  Level of Consciousness: sedated and unresponsive  Airway and Oxygen Therapy: Patient remains intubated per anesthesia plan  Post-op Pain: none  Post-op Assessment: Post-op Vital signs reviewed, Patient's Cardiovascular Status Stable and Respiratory Function Stable  Post-op Vital Signs: Reviewed and stable  Complications: No apparent anesthesia complications

## 2011-12-24 NOTE — Progress Notes (Signed)
PT Cancellation Note  Treatment cancelled today due to pt to OR today.  Will hold PT at this time.    Sunny Schlein, [place] [phone] 12/24/2011, 7:37 AM

## 2011-12-24 NOTE — Transfer of Care (Signed)
Immediate Anesthesia Transfer of Care Note  Patient: Courtney Faulkner  Procedure(s) Performed: Procedure(s) (LRB): MINIMALLY INVASIVE EXCISION OF ATRIAL MYXOMA (N/A)  Patient Location: SICU  Anesthesia Type: General  Level of Consciousness: Patient remains intubated per anesthesia plan  Airway & Oxygen Therapy: Patient remains intubated per anesthesia plan  Post-op Assessment: Report given to PACU RN and Post -op Vital signs reviewed and stable  Post vital signs: Reviewed and stable  Complications: No apparent anesthesia complications

## 2011-12-25 ENCOUNTER — Inpatient Hospital Stay (HOSPITAL_COMMUNITY): Payer: BC Managed Care – PPO

## 2011-12-25 DIAGNOSIS — I6330 Cerebral infarction due to thrombosis of unspecified cerebral artery: Secondary | ICD-10-CM

## 2011-12-25 LAB — POCT I-STAT, CHEM 8
BUN: 16 mg/dL (ref 6–23)
Creatinine, Ser: 0.9 mg/dL (ref 0.50–1.10)
Glucose, Bld: 100 mg/dL — ABNORMAL HIGH (ref 70–99)
Hemoglobin: 8.5 g/dL — ABNORMAL LOW (ref 12.0–15.0)
TCO2: 25 mmol/L (ref 0–100)

## 2011-12-25 LAB — POCT I-STAT 3, ART BLOOD GAS (G3+)
Acid-base deficit: 1 mmol/L (ref 0.0–2.0)
Bicarbonate: 23 mEq/L (ref 20.0–24.0)
Bicarbonate: 24 mEq/L (ref 20.0–24.0)
TCO2: 24 mmol/L (ref 0–100)
pH, Arterial: 7.378 (ref 7.350–7.400)
pO2, Arterial: 111 mmHg — ABNORMAL HIGH (ref 80.0–100.0)
pO2, Arterial: 142 mmHg — ABNORMAL HIGH (ref 80.0–100.0)

## 2011-12-25 LAB — GLUCOSE, CAPILLARY
Glucose-Capillary: 102 mg/dL — ABNORMAL HIGH (ref 70–99)
Glucose-Capillary: 108 mg/dL — ABNORMAL HIGH (ref 70–99)
Glucose-Capillary: 98 mg/dL (ref 70–99)
Glucose-Capillary: 98 mg/dL (ref 70–99)

## 2011-12-25 LAB — MAGNESIUM: Magnesium: 2.3 mg/dL (ref 1.5–2.5)

## 2011-12-25 LAB — CBC
HCT: 27.1 % — ABNORMAL LOW (ref 36.0–46.0)
MCH: 30.5 pg (ref 26.0–34.0)
MCV: 94.9 fL (ref 78.0–100.0)
MCV: 96.1 fL (ref 78.0–100.0)
Platelets: 259 10*3/uL (ref 150–400)
RBC: 2.96 MIL/uL — ABNORMAL LOW (ref 3.87–5.11)
RDW: 15 % (ref 11.5–15.5)
WBC: 13.7 10*3/uL — ABNORMAL HIGH (ref 4.0–10.5)
WBC: 11.6 10*3/uL — ABNORMAL HIGH (ref 4.0–10.5)

## 2011-12-25 LAB — BASIC METABOLIC PANEL
CO2: 24 mEq/L (ref 19–32)
Chloride: 105 mEq/L (ref 96–112)
Potassium: 3.7 mEq/L (ref 3.5–5.1)
Sodium: 136 mEq/L (ref 135–145)

## 2011-12-25 LAB — CREATININE, SERUM: GFR calc Af Amer: >90 mL/min (ref >90)

## 2011-12-25 MED ORDER — MORPHINE SULFATE 4 MG/ML IJ SOLN: 2.0000 mg | INTRAMUSCULAR | Status: DC | PRN

## 2011-12-25 MED ORDER — FUROSEMIDE 10 MG/ML IJ SOLN
20.0000 mg | Freq: Four times a day (QID) | INTRAMUSCULAR | Status: AC
  Administered 2011-12-25 – 2011-12-26 (×3): 20 mg via INTRAVENOUS
  Filled 2011-12-25 (×3): qty 2

## 2011-12-25 MED ORDER — SODIUM CHLORIDE 0.9 % IJ SOLN: 10.0000 mL | INTRAMUSCULAR | Status: DC | PRN

## 2011-12-25 MED ORDER — POTASSIUM CHLORIDE 10 MEQ/50ML IV SOLN
10.0000 meq | INTRAVENOUS | Status: AC
  Administered 2011-12-25 (×3): 10 meq via INTRAVENOUS

## 2011-12-25 MED ORDER — POTASSIUM CHLORIDE 10 MEQ/50ML IV SOLN
INTRAVENOUS | Status: AC
  Administered 2011-12-25: 10 meq via INTRAVENOUS
  Filled 2011-12-25: qty 150

## 2011-12-25 MED ORDER — SODIUM CHLORIDE 0.9 % IJ SOLN
10.0000 mL | Freq: Two times a day (BID) | INTRAMUSCULAR | Status: DC
  Administered 2011-12-26 – 2011-12-28 (×3): 10 mL
  Administered 2011-12-28: 30 mL
  Administered 2011-12-29: 10 mL

## 2011-12-25 MED ORDER — AMIODARONE HCL 200 MG PO TABS
200.0000 mg | ORAL_TABLET | Freq: Two times a day (BID) | ORAL | Status: DC
  Administered 2011-12-26 – 2011-12-29 (×8): 200 mg via ORAL
  Filled 2011-12-25 (×12): qty 1

## 2011-12-25 MED ORDER — ENOXAPARIN SODIUM 40 MG/0.4ML ~~LOC~~ SOLN
40.0000 mg | SUBCUTANEOUS | Status: DC
  Administered 2011-12-26 – 2011-12-30 (×5): 40 mg via SUBCUTANEOUS
  Filled 2011-12-25 (×8): qty 0.4

## 2011-12-25 MED ORDER — INSULIN ASPART 100 UNIT/ML ~~LOC~~ SOLN: 0.0000 [IU] | SUBCUTANEOUS | Status: DC

## 2011-12-25 MED ORDER — FLUCONAZOLE 200 MG PO TABS
200.0000 mg | ORAL_TABLET | Freq: Every day | ORAL | Status: AC
  Administered 2011-12-25 – 2011-12-31 (×7): 200 mg via ORAL
  Filled 2011-12-25 (×8): qty 1

## 2011-12-25 MED ORDER — INSULIN ASPART 100 UNIT/ML ~~LOC~~ SOLN: 0.0000 [IU] | Freq: Three times a day (TID) | SUBCUTANEOUS | Status: DC

## 2011-12-25 NOTE — Progress Notes (Signed)
Stroke Team Progress Note  HISTORY Kathrin KHALEELAH YOWELL is an 61 y.o. female with dysarthria, aphasia and right sided weakness. According to son at bedside, his father heard her vaccuming at ~08:30 on 12/14/2011. He came down stairs at 11:30 and found her confused and dysarthric. EMS called and found BS 36. Patient was subsequently brought to the ED. She arrive at 1214. In the ED patient was given some orange juice and her blood CBG went to 166. Patient subsequently had a head CT done which was negative. Initially a code stroke was called however it was canceled secondary to patient's hypoglycemic spells. Per family patient was recently been treated for upper respiratory symptoms with prednisone and albuterol. Patient and family deny any recent fevers, no chills, no chest pain, no diarrhea, no constipation, no dysuria, no generalized weakness. Patient's family does endorse some shortness of breath on exertion when patient climbs up stairs which has been ongoing for the past 2 weeks. On exam and during the interview patient has some dysarthric speech is alert to self only and has an incoherent speech as well. After treatment, CBG 166, pt remains confused, and having difficulty following commands.  Neuro consult called by Dr. Janee Morn for this reason. Patient was not a TPA candidate secondary to presenting beyond the tPA window. She was transferred to the ICU after admission for hypoglycemia and neuro decline for further evaluation and treatment.  SUBJECTIVE Patient in chair at bedside. Doing well per her and RNs. Dr. Pearlean Brownie discussed case at length with Dr. Cornelius Moras.she had excision of left atrial myxoma with pericardial patch repair of interatrial septum yesterday.  OBJECTIVE Most recent Vital Signs: Filed Vitals:   12/25/11 0737 12/25/11 0800 12/25/11 0900 12/25/11 1000  BP:  103/57 95/52 96/47   Pulse:  89 84 77  Temp: 99.9 F (37.7 C) 99.9 F (37.7 C) 99.9 F (37.7 C)   TempSrc: Core (Comment)     Resp:   26 26 26   Height:      Weight:      SpO2:  100% 100% 100%   CBG (last 3)   Basename 12/25/11 0736 12/25/11 0429 12/25/11 0024  GLUCAP 98 108* 122*   Intake/Output from previous day: 04/30 0701 - 05/01 0700 In: 5496.7 [P.O.:3; I.V.:4162.7; Blood:201; NG/GT:30; IV Piggyback:1100] Out: 3065 [Urine:1945; Blood:600; Chest Tube:520]  IV Fluid Intake:     . sodium chloride 20 mL/hr at 12/24/11 1900  . sodium chloride    . sodium chloride    . amiodarone (NEXTERONE PREMIX) 360 mg/200 mL dextrose 30 mg/hr (12/25/11 0700)  . lactated ringers 20 mL/hr at 12/24/11 1900  . phenylephrine (NEO-SYNEPHRINE) Adult infusion 15 mcg/min (12/25/11 0700)  . DISCONTD: dexmedetomidine (PRECEDEX) IV infusion Stopped (12/24/11 1700)  . DISCONTD: insulin (NOVOLIN-R) infusion Stopped (12/24/11 1725)  . DISCONTD: nitroGLYCERIN Stopped (12/24/11 1500)   MEDICATIONS    . acetaminophen (TYLENOL) oral liquid 160 mg/5 mL  650 mg Per Tube NOW   Or  . acetaminophen  650 mg Rectal NOW  . acetaminophen  1,000 mg Oral Q6H   Or  . acetaminophen (TYLENOL) oral liquid 160 mg/5 mL  975 mg Per Tube Q6H  . aspirin EC  325 mg Oral Daily   Or  . aspirin  324 mg Per Tube Daily  . bisacodyl  10 mg Oral Daily   Or  . bisacodyl  10 mg Rectal Daily  . cefUROXime (ZINACEF)  IV  1.5 g Intravenous To OR  . cefUROXime (ZINACEF)  IV  1.5 g Intravenous Q12H  . docusate sodium  200 mg Oral Daily  . enoxaparin (LOVENOX) injection  40 mg Subcutaneous Q24H  . famotidine (PEPCID) IV  20 mg Intravenous Q12H  . insulin aspart  0-24 Units Subcutaneous Q2H  . insulin aspart  0-24 Units Subcutaneous Q4H  . magnesium sulfate  4 g Intravenous Once  . metoprolol tartrate  25 mg Oral BID  . pantoprazole  40 mg Oral Q1200  . potassium chloride  10 mEq Intravenous Q1 Hr x 3  . potassium chloride  10 mEq Intravenous Once  . potassium chloride  10 mEq Intravenous Q1 Hr x 3  . sodium chloride  3 mL Intravenous Q12H  . vancomycin  1,000 mg  Intravenous Once  . DISCONTD: amiodarone  200 mg Oral BID PC  . DISCONTD: antiseptic oral rinse  15 mL Mouth Rinse BID  . DISCONTD: aspirin EC  81 mg Oral Daily  . DISCONTD: cefUROXime (ZINACEF)  IV  750 mg Intravenous To OR  . DISCONTD: ciprofloxacin  750 mg Oral BID  . DISCONTD: dexmedetomidine (PRECEDEX) IV infusion for high rates  0.1-0.7 mcg/kg/hr Intravenous To OR  . DISCONTD: DOPamine  2-20 mcg/kg/min Intravenous To OR  . DISCONTD: epinephrine  0.5-20 mcg/min Intravenous To OR  . DISCONTD: glutaraldehyde   Topical To OR  . DISCONTD: hydrocortisone sod succinate (SOLU-CORTEF) injection  50 mg Intravenous Q12H  . DISCONTD: insulin aspart  0-24 Units Subcutaneous Q4H  . DISCONTD: insulin regular  0-10 Units Intravenous TID WC  . DISCONTD: magnesium sulfate  40 mEq Other To OR  . DISCONTD: metoprolol tartrate  12.5 mg Per Tube BID  . DISCONTD: metoprolol tartrate  12.5 mg Oral BID  . DISCONTD: metoprolol tartrate  25 mg Oral BID  . DISCONTD: nitroglycerin-nicardipine-HEPARIN-sodium bicarbonate irrigation for artery spasm   Irrigation To OR  . DISCONTD: phenylephrine (NEO-SYNEPHRINE) Adult infusion  30-200 mcg/min Intravenous To OR  . DISCONTD: potassium chloride  80 mEq Other To OR  . DISCONTD: tranexamic acid (CYKLOKAPRON) infusion ([facility])  1.5 mg/kg/hr Intravenous To OR  . DISCONTD: tranexamic acid  2 mg/kg Intracatheter To OR   PRN:  albumin human, calcium chloride, metoprolol, morphine injection, morphine, ondansetron (ZOFRAN) IV, oxyCODONE, sodium chloride, DISCONTD: 0.9 % irrigation (POUR BTL), DISCONTD: acetaminophen, DISCONTD: acetaminophen, DISCONTD: dextrose, DISCONTD: midazolam, DISCONTD: morphine, DISCONTD: ondansetron (ZOFRAN) IV, DISCONTD: ondansetron (ZOFRAN) IV  Diet:  Clear Liquid Activity:   ambulate DVT Prophylaxis:  Lovenox 40 mg sq daily   CLINICALLY SIGNIFICANT STUDIES CBC    Component Value Date/Time   WBC 13.7* 12/25/2011 0432   RBC 2.96* 12/25/2011 0432   HGB  9.2* 12/25/2011 0432   HCT 28.1* 12/25/2011 0432   PLT 259 12/25/2011 0432   MCV 94.9 12/25/2011 0432   MCH 31.1 12/25/2011 0432   MCHC 32.7 12/25/2011 0432   RDW 14.5 12/25/2011 0432   LYMPHSABS 1.9 12/18/2011 0303   MONOABS 0.9 12/18/2011 0303   EOSABS 0.3 12/18/2011 0303   BASOSABS 0.1 12/18/2011 0303   CMP    Component Value Date/Time   NA 136 12/25/2011 0432   K 3.7 12/25/2011 0432   CL 105 12/25/2011 0432   CO2 24 12/25/2011 0432   GLUCOSE 105* 12/25/2011 0432   BUN 19 12/25/2011 0432   CREATININE 0.65 12/25/2011 0432   CALCIUM 8.2* 12/25/2011 0432   PROT 6.5 12/20/2011 1818   ALBUMIN 2.4* 12/20/2011 1818   AST 32 12/20/2011 1818   ALT 30 12/20/2011 1818   ALKPHOS  54 12/20/2011 1818   BILITOT 0.2* 12/20/2011 1818   GFRNONAA >90 12/25/2011 0432   GFRAA >90 12/25/2011 0432   COAGS Lab Results  Component Value Date   INR 1.43 12/24/2011   INR 1.05 12/20/2011   INR 1.04 12/17/2011   Lipid Panel    Component Value Date/Time   CHOL 134 12/16/2011 0311   TRIG 90 12/16/2011 0311   HDL 45 12/16/2011 0311   CHOLHDL 3.0 12/16/2011 0311   VLDL 18 12/16/2011 0311   LDLCALC 71 12/16/2011 0311   HgbA1C  Lab Results  Component Value Date   HGBA1C 5.1 12/20/2011   Cardiac Panel (last 3 results)  No results found for this basename: CKTOTAL:3,CKMB:3,TROPONINI:3,RELINDX:3 in the last 72 hours Urinalysis    Component Value Date/Time   COLORURINE YELLOW 12/23/2011 1320   APPEARANCEUR CLOUDY* 12/23/2011 1320   LABSPEC 1.029 12/23/2011 1320   PHURINE 6.0 12/23/2011 1320   GLUCOSEU NEGATIVE 12/23/2011 1320   HGBUR NEGATIVE 12/23/2011 1320   BILIRUBINUR NEGATIVE 12/23/2011 1320   KETONESUR NEGATIVE 12/23/2011 1320   PROTEINUR NEGATIVE 12/23/2011 1320   UROBILINOGEN 0.2 12/23/2011 1320   NITRITE NEGATIVE 12/23/2011 1320   LEUKOCYTESUR MODERATE* 12/23/2011 1320   Urine Drug Screen  No results found for this basename: labopia,  cocainscrnur,  labbenz,  amphetmu,  thcu,  labbarb    Alcohol Level No results found for this  basename: eth   VITAMIN B12     Status: Normal   Collection Time   12/15/11  9:00 AM      Component Value Range   Vitamin B-12 287  211 - 911 (pg/mL)  IRON AND TIBC     Status: Abnormal   Collection Time   12/15/11  9:00 AM      Component Value Range   Iron 39 (*) 42 - 135 (ug/dL)   TIBC 540 (*) 981 - 191 (ug/dL)   Saturation Ratios 17 (*) 20 - 55 (%)   UIBC 192  125 - 400 (ug/dL)  FERRITIN     Status: Normal   Collection Time   12/15/11  9:00 AM      Component Value Range   Ferritin 200  10 - 291 (ng/mL)  RETICULOCYTES     Status: Normal   Collection Time   12/15/11  9:00 AM      Component Value Range   Retic Ct Pct 2.9  0.4 - 3.1 (%)   RBC. 3.97  3.87 - 5.11 (MIL/uL)   Retic Count, Manual 115.1  19.0 - 186.0 (K/uL)  PRO B NATRIURETIC PEPTIDE     Status: Abnormal   Collection Time   12/15/11  9:00 AM      Component Value Range   Pro B Natriuretic peptide (BNP) 441.6 (*) 0 - 125 (pg/mL)   CT of the brain   12/15/2011  Developing cytotoxic edema correlates with the observed pattern of restricted diffusion on MR, representing acute infarction. This primarily involves the left basal ganglia.  Smaller areas of cortical and subcortical infarction seen on MRI in the left hemisphere are too small to be easily visible on CT.   No incipient uncal or transtentorial herniation, although minimal 1 mm left to right shift may be present.  No hemorrhagic transformation.    12/14/2011  Normal CT of the head without contrast.   CT Angio Head 12/16/2011  Acute non hemorrhagic infarct left basal ganglia, left uncus and left corona radiata.  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  Collateral flow  to the left anterior cerebral artery and left middle cerebral artery branches.  The flow within the left middle cerebral artery branches is diminished suggesting that collateral flow is not adequate.   CT Angio Neck 12/16/2011  Abnormal appearance of the left internal carotid artery with appearance of  dissection with complete occlusion at the C2 level.  Abnormal appearance of the proximal left common carotid artery. Question if this is related to artifact versus dissection versus atherosclerotic type changes.  Mild narrowing proximal right common carotid artery.  Prominent ectasia of the proximal right common carotid artery with fold and mild narrowing.  Mild irregularity at the right carotid bifurcation without measurable stenosis.  Mild to moderate narrowing proximal right vertebral artery.  Mild irregularity throughout the course of the right vertebral artery.  Left vertebral artery arises directly from the aortic arch. Mild narrowing proximal left vertebral artery.  Evaluation of portions of the proximal left vertebral artery is limited by artifact.  Nonspecific parenchymal changes lung apices greater on the right. Findings can be evaluated with a formal chest CT.   MRI of the brain  12/14/2011  Moderate sized acute left hemisphere infarct which appears to be secondary to an acute left internal carotid artery occlusion. No visible hemorrhage or midline shift.  Emboli to the left middle cerebral artery and its branches is not excluded.  MRA of the brain  See CT angio  2D Echocardiogram  EF 60-65% with no source of embolus.  Left atrial mass attached to intra-atrial septum highly suggestive of myxoma.  Carotid Doppler  Right: no ICA stenosis. Left: ICA appears occluded. Bilateral vertebral artery flow is antegrade.  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  CXR  12/25/2011  1. Support apparatus, as above. 2. Low lung volumes with worsening bibasilar aeration likely reflect increasing atelectasis with superimposed small bilateral pleural effusions. 3. There may be some mild interstitial pulmonary edema as well. 12/24/2011 Bibasilar atelectasis, greater on the right. Question minimal perihilar edema. Tip of endotracheal tube is 3 mm from  carina directed into the right mainstem bronchus, recommend withdrawal 2 cm. Recommend clinical confirmation of the position of the left jugular catheter as above. 12/20/2011 1. Improvement in pulmonary edema. 2. Cardiac enlargement, low lung volumes and bibasilar atelectasis.  12/18/2011 Some increase in airspace disease likely due to progressive edema. 12/16/2011  Appearance of chest is similar to the recent prior study, again suggesting a background of mild interstitial pulmonary edema. Overall, aeration has slightly improved, with decreasing small bilateral pleural effusions. 12/14/2011   Cardiomegaly and bilateral airspace disease most consistent with pulmonary edema.     CT chest 12/17/2011 Respiratory motion degraded examination with poor inspiration. Scattered pulmonary parenchymal changes some which appears to represent scarring/interstitial fibrosis. Right upper lobe focal bronchiectasis. Scattered regions of mild consolidation may represent scarring although difficult to exclude superimposed infection  CT Abdomen and Pelvis 12/17/2011 On this unenhanced examination, no definitive pancreatic masses identified however, evaluation without contrast is markedly limited. For detection of a neuroendocrine tumor, preferable examination is contrast enhanced MR. If the patient cannot hold her breath, CT with contrast with pancreatic protocol can be obtained. Very small amount of free fluid. Etiology indeterminate.  Cardiac Cath 12/18/2011  1. Normal coronary arteries. 2. Normal abdominal aorta, iliac and femoral arteries. 3. Left atrial myxoma identified by neovascularization during right coronary angiography. 4. Left ventriculography was not performed.  MRI Abdomen 12/22/2011 1. Diminished  exam detail due to respiratory motion artifact. 2. No pancreatic mass identified. 3. Indeterminant T2 intense structure within the right hepatic lobe. Not imaged on the T1-weighted images were the postcontrast images.  Because of the patient's difficulty with breath-hold I would advise a three-phase liver imaging with contrast enhanced CT of the abdomen for more definitive assessment.  EKG  normal sinus rhythm.   Physical Exam   Pleasant middle-age [place] American lady currently not in distress.Awake alert. Afebrile. Head is nontraumatic. Neck is supple without bruit. Hearing is normal. Cardiac exam no murmur or gallop. Lungs are clear to auscultation. Distal pulses are well felt.  Neurological Exam ; awake alert mild expressive aphasia with word finding difficulties and paraphasias. Good comprehension  and repitition but poor naming.. Able to follow commands well. Extraocular movements are full range without nystagmus. No visual field defect to bedside confrontational testing. Mild right lower facial weakness. Tongue is midline. Motor system exam reveals mild right hemiparesis with RUE 3/5 weaknessand moderate right grip and intrinsic hand muscles. Good right lower extremity strength without drift. The minimum right hip flexion and ankle dorsiflexion weakness. Sensation slightly diminished on the right compared to the left. Coordination is slow but accurate. Gait was not tested.  ASSESSMENT Ms. SALIAH CRISP is a 61 y.o. 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 Status post successful myxoma excision by Dr. Cornelius Moras 12/24/2011. Now in SICU post op. Neuro status stable. On no antiplatelets prior to admission. Now on aspirin 325 mg orally every day for secondary stroke prevention. Patient with resultant right hemiparesis and expressive aphasia with improvement. Therapies have been ordered for ambulation.  -left atrial myxoma, successful removal 12/24/2011. POD1 -left ICA occlusion suspect secondary to myxoma emboli vs dissection -tachycardia -hypoglycemia, recurrent, ? Etiology -elevated cardiac enzymes on admission, cardiac related.  -short  PR -obesity, class 2, Body mass index is 38.55 kg/(m^2). -hypotension -leukocytosis  Hospital day # 11  TREATMENT/PLAN -OOB. Therapy evals today. ? Rehab consult  Joaquin Music, ANP-BC, GNP-BC [facility] Pager: [phone] 12/25/2011 10:24 AM  Dr. Delia Heady, Stroke Center Medical Director, has personally reviewed chart, pertinent data, examined the patient and developed the plan of care. Pager:  [phone]  ,

## 2011-12-25 NOTE — Evaluation (Signed)
Speech Language Pathology Evaluation Patient Details Name: Courtney Faulkner MRN: [id number] DOB: 11-18-1950 Today's Date: 12/25/2011 Time: 1120-1140 SLP Time Calculation (min): 20 min  HPI:  61 yr old admitted with dysarthria and incoherent speech.  Found to have left infarct.  Underwent excision of left atrial myxoma 4/30. Problem List:  Patient Active Problem List  Diagnoses  . Hypoglycemia  . Anemia  . Atrial myxoma  . CVA (cerebral infarction)  . ICAO (internal carotid artery occlusion), left   Past Medical History:  Past Medical History  Diagnosis Date  . Upper respiratory tract infection   . ICAO (internal carotid artery occlusion), left 12/15/2011  . 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 CVA (cerebral infarction) 12/15/2011  . Hypoglycemia 12/14/2011   Past Surgical History: History reviewed. No pertinent past surgical history.  Assessment / Plan / Recommendation Clinical Impression  Pt. exhibited language deficits primarily with expression of thoughts and ideas.  Compared to initial assessment 4/22, pt.'s emergent awareness of language errors has improved somewhat   Comprehension for basic and moderate level information appears functional, however deficits are evident when the complexity of information increases.  Contributing factors include cognitive deficits in the areas of attention, awareness, reasoning, and mildly decreased impulse control.  Pt. expresses herself mostly at the phrase and sentence level with paraphasias, mild perseveration and fewer neologisms than initial eval.  Pt. would benefit from skilled ST to improve cognitive-linguistic abilites to return to prior level of function.    SLP Assessment  Patient needs continued Speech Lanaguage Pathology Services    Follow Up Recommendations       Frequency and Duration min 2x/week  2 weeks   Pertinent Vitals/Pain    SLP Goals  SLP Goals Potential to Achieve Goals: Good Potential Considerations:  Co-morbidities Progress/Goals/Alternative treatment plan discussed with pt/caregiver and they: Agree SLP Goal #1: Pt. will demonstrate word finding techniques during explanations/descriptions with min verbal cues. SLP Goal #1 - Progress: Progressing toward goal SLP Goal #2: Pt. will initiate self-correction of errors with min visual/gestural cues. SLP Goal #2 - Progress: Progressing toward goal SLP Goal #3: Pt. will demonstrate comprehension of complex information with min verbal cues. 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[employer])   Cognition  Overall Cognitive Status: Impaired Arousal/Alertness: Lethargic Orientation Level: Oriented X4 Attention: Sustained Sustained Attention: Impaired Sustained Attention Impairment: Verbal basic Memory:  (To be further assessed) Awareness: Impaired Awareness Impairment: Emergent impairment;Intellectual impairment;Anticipatory impairment (Emergent is improving from last week) Problem Solving: Impaired Problem Solving Impairment: Functional complex Safety/Judgment: Impaired    Comprehension  Auditory Comprehension Overall Auditory Comprehension: Impaired Yes/No Questions: Within Functional Limits Commands: Within Functional Limits EffectiveTechniques: Extra processing time Visual Recognition/Discrimination Discrimination: Not tested Reading Comprehension Reading Status: Not tested    Expression Expression Primary Mode of Expression: Verbal Verbal Expression Overall Verbal Expression: Impaired Initiation: No impairment Level of Generative/Spontaneous Verbalization: Sentence Repetition: No impairment Naming: Impairment Responsive:  (WFL) Confrontation: Within functional limits Convergent: Not  tested Divergent: 0-24% accurate Pragmatics: No impairment Non-Verbal Means of Communication: Not applicable Written Expression Dominant Hand: Right Written Expression: Not tested   Oral / Motor Oral Motor/Sensory Function Overall Oral Motor/Sensory Function: Appears within functional limits for tasks assessed Motor Speech Overall Motor Speech: Appears within functional limits for tasks assessed Respiration: Within functional limits Phonation: Normal Resonance: Within  functional limits Articulation: Within functional limitis Intelligibility: Intelligible Motor Planning: Witnin functional limits   [employer] M.Ed CCC-SLP Pager [phone]  12/25/2011   Royce Macadamia 12/25/2011, 3:40 PM

## 2011-12-25 NOTE — Progress Notes (Signed)
Subjective:  The patient is s/p excision of left atrial myxoma.  The patient tolerated the procedure well, extubated, currently recovering in the ICU, with stable vitals.  Dr. Cornelius Moras is primary, we will continue to consult for hypoglycemia.  Patient is off dextrose gttp. Her CBG is stablized slightly above 100.   Objective: Vital signs in last 24 hours: Filed Vitals:   12/25/11 0500 12/25/11 0600 12/25/11 0700 12/25/11 0737  BP: 103/59 98/59 101/58   Pulse: 89 89 90   Temp: 99.5 F (37.5 C) 99.5 F (37.5 C) 99.7 F (37.6 C) 99.9 F (37.7 C)  TempSrc:    Core (Comment)  Resp: 25 26 25    Height:      Weight:  217 lb 9.5 oz (98.7 kg)    SpO2: 100% 100% 100%    Weight change:   Intake/Output Summary (Last 24 hours) at 12/25/11 0835 Last data filed at 12/25/11 0700  Gross per 24 hour  Intake 5496.69 ml  Output   3065 ml  Net 2431.69 ml    Physical Exam:  General: drowsy, arousalable,  and in no apparent distress HEENT: pupils equal round and reactive to light, vision grossly intact, oropharynx clear and non-erythematous, right lower facial droop noted. Neck: supple, no lymphadenopathy Lungs: clear to ascultation bilaterally, normal work of respiration, no wheezes, rales, ronchi Heart: regular rate and rhythm, no murmurs, gallops, or rubs Abdomen: soft, non-tender, non-distended, normal bowel sounds Extremities: no cyanosis, clubbing, or edema Neurologic: not oriented to time and place, right lower facial droop noted, otherwise CN II-VI and VIII-XII intact, strength 4/5 in right shoulder flexion and adduction, 4-5/5 in right elbow flexion/extension, otherwise 5/5 throughout.  Expressive aphasia noted  Lab Results: Basic Metabolic Panel:  Lab 12/25/11 6578 12/24/11 2047 12/24/11 2030 12/23/11 0615 12/19/11 0500  NA 136 141 -- -- --  K 3.7 4.4 -- -- --  CL 105 110 -- -- --  CO2 24 -- -- 24 --  GLUCOSE 105* 122* -- -- --  BUN 19 21 -- -- --  CREATININE 0.65 0.80 -- -- --    CALCIUM 8.2* -- -- 8.9 --  MG 2.3 -- 3.1* -- --  PHOS -- -- -- -- 3.9   Liver Function Tests:  Lab 12/20/11 1818  AST 32  ALT 30  ALKPHOS 54  BILITOT 0.2*  PROT 6.5  ALBUMIN 2.4*   CBC:  Lab 12/25/11 0432 12/24/11 2047 12/24/11 2030  WBC 13.7* -- 19.2*  NEUTROABS -- -- --  HGB 9.2* 9.9* --  HCT 28.1* 29.0* --  MCV 94.9 -- 94.7  PLT 259 -- 291   CBG:  Lab 12/25/11 0736 12/25/11 0429 12/25/11 0024 12/24/11 2352 12/24/11 2157 12/24/11 1823  GLUCAP 98 108* 122* 98 132* 107*   Hemoglobin A1C:  Lab 12/20/11 1818  HGBA1C 5.1   Coagulation:  Lab 12/24/11 1435 12/20/11 1818  LABPROT 17.7* 13.9  INR 1.43 1.05   Misc. Labs:  Parathyroid hormone: Pending  Proinsulin/Insulin Level: Pending  C-Peptide: 4.17 (H)  AM Cortisol: 10.9  Sed Rate: 46  ANA: Neg  Anti-DNA: 1 (Neg)  RF: <10  C3: 118  C4: 51  CRP: 4.85  ANCA: Neg  MPO/PR-3: <1   Micro Results: 4/20 - 1/2 blood cultures with coag-neg staph 4/23 - blood cultures negative  Studies/Results: Dg Chest Portable 1 View  12/24/2011  *RADIOLOGY REPORT*  Clinical Data: Post op  PORTABLE CHEST - 1 VIEW  Comparison: Portable exam 1452 hours compared to 12/20/2011  Findings: Tip of endotracheal tube approximately 3 mm above carina directed into the right mainstem bronchus, recommend withdrawal 2 cm. Mild enlargement of cardiac silhouette. Nasogastric tube extends into stomach. Left jugular central venous catheter, tip projecting over left mediastinum and superior aspect of aortic arch, unable to confirm position in this setting; recommend clinical confirmation of intravenous position. Pair of right thoracostomy tubes. Bibasilar atelectasis greater on right. No gross pleural effusion or pneumothorax. Question minimal perihilar edema.  IMPRESSION: Bibasilar atelectasis, greater on the right. Question minimal perihilar edema. Tip of endotracheal tube is 3 mm from carina directed into the right mainstem bronchus, recommend  withdrawal 2 cm. Recommend clinical confirmation of the position of the left jugular catheter as above.  Position of ET tube and recommendation for repositioning called to [facility] on 2300 Unit at 1506 hours on 12/24/2011.  Original Report Authenticated By: Lollie Marrow, M.D.   Medications: I have reviewed the patient's current medications. Scheduled Meds:    . acetaminophen (TYLENOL) oral liquid 160 mg/5 mL  650 mg Per Tube NOW   Or  . acetaminophen  650 mg Rectal NOW  . acetaminophen  1,000 mg Oral Q6H   Or  . acetaminophen (TYLENOL) oral liquid 160 mg/5 mL  975 mg Per Tube Q6H  . aspirin EC  325 mg Oral Daily   Or  . aspirin  324 mg Per Tube Daily  . bisacodyl  10 mg Oral Daily   Or  . bisacodyl  10 mg Rectal Daily  . cefUROXime (ZINACEF)  IV  1.5 g Intravenous To OR  . cefUROXime (ZINACEF)  IV  1.5 g Intravenous Q12H  . dexmedetomidine (PRECEDEX) IV infusion for high rates  0.1-0.7 mcg/kg/hr Intravenous To OR  . docusate sodium  200 mg Oral Daily  . famotidine (PEPCID) IV  20 mg Intravenous Q12H  . insulin aspart  0-24 Units Subcutaneous Q2H   Followed by  . insulin aspart  0-24 Units Subcutaneous Q4H  . insulin (NOVOLIN-R) infusion   Intravenous To OR  . magnesium sulfate  4 g Intravenous Once  . metoprolol tartrate  25 mg Oral BID  . nitroGLYCERIN  2-200 mcg/min Intravenous To OR  . pantoprazole  40 mg Oral Q1200  . potassium chloride  10 mEq Intravenous Q1 Hr x 3  . potassium chloride  10 mEq Intravenous Once  . potassium chloride  10 mEq Intravenous Q1 Hr x 3  . sodium chloride  3 mL Intravenous Q12H  . tranexamic acid  15 mg/kg Intravenous To OR  . tranexamic acid (CYKLOKAPRON) infusion ([facility])  1.5 mg/kg/hr Intravenous To OR  . vancomycin  1,500 mg Intravenous To OR  . vancomycin  1,000 mg Intravenous Once  . DISCONTD: amiodarone  200 mg Oral BID PC  . DISCONTD: antiseptic oral rinse  15 mL Mouth Rinse BID  . DISCONTD: aspirin EC  81 mg Oral Daily  . DISCONTD:  cefUROXime (ZINACEF)  IV  750 mg Intravenous To OR  . DISCONTD: ciprofloxacin  750 mg Oral BID  . DISCONTD: dexmedetomidine (PRECEDEX) IV infusion for high rates  0.1-0.7 mcg/kg/hr Intravenous To OR  . DISCONTD: DOPamine  2-20 mcg/kg/min Intravenous To OR  . DISCONTD: epinephrine  0.5-20 mcg/min Intravenous To OR  . DISCONTD: glutaraldehyde   Topical To OR  . DISCONTD: hydrocortisone sod succinate (SOLU-CORTEF) injection  50 mg Intravenous Q12H  . DISCONTD: insulin regular  0-10 Units Intravenous TID WC  . DISCONTD: magnesium sulfate  40 mEq Other To  OR  . DISCONTD: metoprolol tartrate  12.5 mg Per Tube BID  . DISCONTD: metoprolol tartrate  12.5 mg Oral BID  . DISCONTD: metoprolol tartrate  25 mg Oral BID  . DISCONTD: nitroglycerin-nicardipine-HEPARIN-sodium bicarbonate irrigation for artery spasm   Irrigation To OR  . DISCONTD: phenylephrine (NEO-SYNEPHRINE) Adult infusion  30-200 mcg/min Intravenous To OR  . DISCONTD: potassium chloride  80 mEq Other To OR  . DISCONTD: tranexamic acid (CYKLOKAPRON) infusion ([facility])  1.5 mg/kg/hr Intravenous To OR  . DISCONTD: tranexamic acid  2 mg/kg Intracatheter To OR   Continuous Infusions:    . sodium chloride 20 mL/hr at 12/24/11 1900  . sodium chloride    . sodium chloride    . amiodarone (NEXTERONE PREMIX) 360 mg/200 mL dextrose 30 mg/hr (12/25/11 0700)  . dexmedetomidine (PRECEDEX) IV infusion Stopped (12/24/11 1700)  . lactated ringers 20 mL/hr at 12/24/11 1900  . nitroGLYCERIN Stopped (12/24/11 1500)  . phenylephrine (NEO-SYNEPHRINE) Adult infusion 15 mcg/min (12/25/11 0700)  . DISCONTD: insulin (NOVOLIN-R) infusion Stopped (12/24/11 1725)   PRN Meds:.albumin human, calcium chloride, metoprolol, midazolam, morphine injection, morphine, ondansetron (ZOFRAN) IV, oxyCODONE, sodium chloride, DISCONTD: 0.9 % irrigation (POUR BTL), DISCONTD: acetaminophen, DISCONTD: acetaminophen, DISCONTD: dextrose, DISCONTD: ondansetron (ZOFRAN) IV, DISCONTD:  ondansetron (ZOFRAN) IV  Assessment/Plan: The patient is a 61 yo woman, no prior significant medical history, presenting with weakness and dysphagia, found to have an acute CVA, likely secondary to atrial myxoma.  # Left atrial myxoma - POD #0 s/p resection -CVTS primary -awaiting pathology to determine etiology  # Recurrent hypoglycemia - Patient presented with hypoglycemia, which persisted during her first few days of hospitalization.  D5-NS drip discontinued 4/29.  Patient's cbg's currently well-controlled.  Elevated C-peptide, elevated proinsulin, normal insulin levels.  Differential includes insulinoma vs insulin autoimmune hypoglycemia vs infectious etiology (with myxoma as possible source).  CT and MRI abd showed no lesions (though MRI of poor quality due to respiratory motion).  GI consulted, considered EUS for insulinoma, but per CVTS not stable for procedure for several months post-op.  -pending insulin and insulin receptor antibodies -continue to monitor cbg's, now that patient is off of D5-NS and steroids  # Acute CVA - likely represents thrombus from atrial myxoma, also considering L ICA dissection (given CT neck results). -continue aspirin 81 daily -perioperative heparin drip -continue PT, OT, SLP, to improve patient's RUE weakness, and R CN VII function -will obtain CIR consult when patient recovers from surgery for further stroke rehab  # Abnormal blood culture result - 1/2 blood cultures from 4/20 showing coag-neg staph, initially concerning for bacteremia, treated with vanc/ceftriaxone.  However, blood cultures 4/23 negative, so staph likely contaminant. -vanc/ceftriaxone discontinued 4/29  # Prophy - SCD's   LOS: 11 days   Lorretta Harp 12/25/2011, 8:35 AM

## 2011-12-25 NOTE — Progress Notes (Signed)
Physical Therapy Note   12/25/11 0900  PT Visit Information  Last PT Received On 12/25/11  Assistance Needed +2  PT Time Calculation  PT Start Time 0905  PT Stop Time 0935  PT Time Calculation (min) 30 min  Subjective Data  Subjective I'm ok.    Patient Stated Goal Get better  Precautions  Precautions Fall (Chest tube)  Precaution Comments No strict Sternal precautions.    Restrictions  Weight Bearing Restrictions No  Home Living  Lives With Spouse  Available Help at Discharge Family;Friend(s)  Type of Home House  Home Access Stairs to enter  Entrance Stairs-Number of Steps 3  Entrance Stairs-Rails None  Home Layout Two level;Bed/bath upstairs;Full bath on main level  Alternate Level Stairs-Number of Steps 12  Alternate Level Stairs-Rails Right;Left  Scientist, physiological Yes  How Accessible Accessible via walker  Home Adaptive Equipment None  Prior Function  Level of Independence Independent  Able to Take Stairs? 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  )  Cognition  Overall Cognitive Status Difficult to assess  Difficult to assess due to Impaired communication  Cognition - Other Comments pt with very short responses to questions, but all responses appropriate to task and pt followed all directions.    Right Lower Extremity Assessment  RLE ROM/Strength/Tone Deficits  RLE ROM/Strength/Tone Deficits PROM WFL, Knee extension 3/5, Dorsiflexion 4-/5  RLE Sensation WFL - Light Touch  Left Lower Extremity Assessment  LLE ROM/Strength/Tone WFL  LLE Sensation WFL - Light Touch  Bed Mobility  Bed Mobility Supine to Sit;Sitting - Scoot to Edge of Bed  Supine to Sit 1: +2 Total assist  Supine to Sit: Patient Percentage 50%  Sitting - Scoot to Edge of Bed 4: Min assist  Details  for Bed Mobility Assistance cues for sequencing and initiation, cues to minimize UE use, however not on strict sternal precautions.    Transfers  Transfers Sit to Stand;Stand to Sit;Stand Pivot Transfers  Sit to Stand 1: +2 Total assist;With upper extremity assist;From bed  Sit to Stand: Patient Percentage 50%  Stand to Sit 1: +2 Total assist;With upper extremity assist;With armrests;To chair/3-in-1  Stand to Sit: Patient Percentage 50%  Stand Pivot Transfers 1: +2 Total assist  Stand Pivot Transfers: Patient Percentage 40%  Details for Transfer Assistance cues for use of UEs, rocking to use momentum with sit to stand.  cues to complete pivot to chair prior to trying to sit.    Ambulation/Gait  Ambulation/Gait Assistance Not tested (comment)  Stairs No  Wheelchair Mobility  Wheelchair Mobility No  Modified Rankin (Stroke Patients Only)  Modified Rankin 5  Balance  Balance Assessed No  PT - End of Session  Equipment Utilized During Treatment Gait belt  Activity Tolerance Patient tolerated treatment well  Patient left in chair;with call bell/phone within reach  Nurse Communication Mobility status  PT Assessment  Clinical Impression Statement pt presents with L Internal Carotid Occlusion and found a L Atrial Myxoma which is now s/p resection.  pt very motivated to get OOB.  pt needing +2 for mobility and safety.    PT Recommendation/Assessment Patient needs continued PT services  PT Problem List Decreased strength;Decreased mobility;Decreased coordination;Decreased cognition;Decreased knowledge of use of DME;Cardiopulmonary status limiting activity;Obesity;Pain;Impaired sensation;Decreased activity tolerance;Decreased balance;Decreased range of motion  Barriers to Discharge None  PT Therapy Diagnosis  Difficulty walking;Generalized weakness;Hemiplegia dominant side;Altered mental status  PT Plan  PT Frequency Min 4X/week  PT Treatment/Interventions DME instruction;Gait  training;Functional mobility training;Therapeutic activities;Therapeutic exercise;Balance training;Neuromuscular re-education;Cognitive remediation;Patient/family education  PT Recommendation  Recommendations for Other Services Rehab consult  Follow Up Recommendations Inpatient Rehab  Equipment Recommended Defer to next venue  Individuals Consulted  Consulted and Agree with Results and Recommendations Patient  Acute Rehab PT Goals  PT Goal Formulation With patient  Time For Goal Achievement 01/08/12  Potential to Achieve Goals Good  Pt will go Supine/Side to Sit with modified independence  PT Goal: Supine/Side to Sit - Progress Goal set today  Pt will go Sit to Supine/Side with modified independence  PT Goal: Sit to Supine/Side - Progress Goal set today  Pt will go Sit to Stand with supervision;with upper extremity assist  PT Goal: Sit to Stand - Progress Goal set today  Pt will go Stand to Sit with supervision;with upper extremity assist  PT Goal: Stand to Sit - Progress Goal set today  Pt will Transfer Bed to Chair/Chair to Bed with supervision  PT Transfer Goal: Bed to Chair/Chair to Bed - Progress Goal set today  Pt will Ambulate 51 - 150 feet;with min assist;with rolling walker  PT Goal: Ambulate - Progress Goal set today  Written Expression  Dominant Hand Right    Courtney Faulkner, PT [phone]

## 2011-12-25 NOTE — Progress Notes (Signed)
Patient ID: Courtney Faulkner, female   DOB: 1951/07/04, 61 y.o.   MRN: [id number]                   [address]            [place] [address]          [phone]     1 Day Post-Op Procedure(s) (LRB): MINIMALLY INVASIVE EXCISION OF ATRIAL MYXOMA (N/A)  Total Length of Stay:  LOS: 11 days  BP 97/59  Pulse 78  Temp(Src) 99.2 F (37.3 C) (Oral)  Resp 24  Ht 5\' 3"  (1.6 m)  Wt 217 lb 9.5 oz (98.7 kg)  BMI 38.55 kg/m2  SpO2 100%     . sodium chloride 20 mL/hr at 12/25/11 1700  . sodium chloride    . sodium chloride    . amiodarone (NEXTERONE PREMIX) 360 mg/200 mL dextrose 30 mg/hr (12/25/11 1317)  . lactated ringers 20 mL/hr at 12/24/11 1900  . phenylephrine (NEO-SYNEPHRINE) Adult infusion 15 mcg/min (12/25/11 0700)  . DISCONTD: dexmedetomidine (PRECEDEX) IV infusion Stopped (12/24/11 1700)  . DISCONTD: nitroGLYCERIN Stopped (12/24/11 1500)     Lab Results  Component Value Date   WBC 11.6* 12/25/2011   HGB 8.5* 12/25/2011   HCT 25.0* 12/25/2011   PLT 226 12/25/2011   GLUCOSE 100* 12/25/2011   CHOL 134 12/16/2011   TRIG 90 12/16/2011   HDL 45 12/16/2011   LDLCALC 71 12/16/2011   ALT 30 12/20/2011   AST 32 12/20/2011   NA 140 12/25/2011   K 3.8 12/25/2011   CL 103 12/25/2011   CREATININE 0.90 12/25/2011   BUN 16 12/25/2011   CO2 24 12/25/2011   TSH 3.588 12/16/2011   INR 1.43 12/24/2011   HGBA1C 5.1 12/20/2011   Stable post op  Delight Ovens MD  Beeper [phone] Office [phone] 12/25/2011 6:38 PM

## 2011-12-25 NOTE — Progress Notes (Addendum)
   CARDIOTHORACIC SURGERY PROGRESS NOTE   R1 Day Post-Op Procedure(s) (LRB): MINIMALLY INVASIVE EXCISION OF ATRIAL MYXOMA (N/A)  Subjective: Looks good.  Extubated uneventfully.  Awake and alert, neuro back to preop baseline.  Denies pain, shortness of breath.  Objective: Vital signs: BP Readings from Last 1 Encounters:  12/25/11 103/57   Pulse Readings from Last 1 Encounters:  12/25/11 89   Resp Readings from Last 1 Encounters:  12/25/11 26   Temp Readings from Last 1 Encounters:  12/25/11 99.9 F (37.7 C)     Hemodynamics: PAP: (45)/(14) 45/14 mmHg CVP:  [6 mmHg-16 mmHg] 11 mmHg  Physical Exam:  Rhythm:   sinus  Breath sounds: clear  Heart sounds:  RRR  Incisions:  Clean and dry  Abdomen:  Soft, non distended, non tender  Extremities:  Warm, well perfused   Intake/Output from previous day: 04/30 0701 - 05/01 0700 In: 5496.7 [P.O.:3; I.V.:4162.7; Blood:201; NG/GT:30; IV Piggyback:1100] Out: 3065 [Urine:1945; Blood:600; Chest Tube:520] Intake/Output this shift: Total I/O In: 114 [I.V.:64; IV Piggyback:50] Out: 110 [Urine:60; Chest Tube:50]  Lab Results:  Basename 12/25/11 0432 12/24/11 2047 12/24/11 2030  WBC 13.7* -- 19.2*  HGB 9.2* 9.9* --  HCT 28.1* 29.0* --  PLT 259 -- 291   BMET:  Basename 12/25/11 0432 12/24/11 2047 12/23/11 0615  NA 136 141 --  K 3.7 4.4 --  CL 105 110 --  CO2 24 -- 24  GLUCOSE 105* 122* --  BUN 19 21 --  CREATININE 0.65 0.80 --  CALCIUM 8.2* -- 8.9    CBG (last 3)   Basename 12/25/11 0736 12/25/11 0429 12/25/11 0024  GLUCAP 98 108* 122*   ABG    Component Value Date/Time   PHART 7.378 12/24/2011 2327   HCO3 24.0 12/24/2011 2327   TCO2 25 12/24/2011 2327   ACIDBASEDEF 1.0 12/24/2011 2327   O2SAT 99.0 12/24/2011 2327   CXR: Looks good with mild bibasilar atelectasis and congestion  Assessment/Plan: S/P Procedure(s) (LRB): MINIMALLY INVASIVE EXCISION OF ATRIAL MYXOMA (N/A)  Doing well POD1 Expected post op acute  blood loss anemia, mild, stable Expected post op volume excess, mild S/P acute left brain stroke Left ICA occlusion, question due to embolization from myxyoma vs spontaneous dissection Preop hypoglycemia, etiology unclear, MRI of abdomen non-diagnostic for pancreatic mass Preop UTI, culture growing yeast   Mobilize  Diuresis  Supplement K+  Resume PT/OT  Low dose lovenox for DVT prophylaxis  Coumadin? - will defer to neurology/vascular surgery unless patient develops Afib  Continue prophylactic amiodarone for now and convert to oral  Start Diflucan for UTI and try to get foley out soon   Courtney Faulkner 12/25/2011 8:48 AM

## 2011-12-25 NOTE — Consult Note (Signed)
IM Consult  I believe this patient is currently on the thoracic surgery service and IM has been asked to consult regarding her persistent hypoglycemia over the days prior to surgery.  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  W/U underway for possible insulinoma.  Negative CT and MR; EUS deferred for surgery.  Non-diabetic and no hypoglycemic rx known.  Vague hx of recent prednisone rx for dyspnea -- need details to be sure she has not been exposed to withdrawal from high dose steroids.  I am unclear whether her ICA occlusion is thrombo-embolic or embolization of myxomatous material.   I am seeing her for the first time this AM.  She is P.O. day #1.  She is alert but has a moderately severe aphasia which my team reports is noticeably worse than 3 days ago.  Will ask Neurology consultant to clarify this and advise whether further insult is likely.  W/u of hypoglycemia will be completed when her condition permits.  Myxomas are rare and famously associated with strange para-neoplastic phenomena -- is there any tie-in to hypoglycemia?

## 2011-12-25 NOTE — Progress Notes (Signed)
   CARE MANAGEMENT NOTE 12/25/2011  Patient:  Courtney Faulkner, Courtney Faulkner   Account Number:  [id number]  Date Initiated:  12/23/2011  Documentation initiated by:  Ronny Flurry  Subjective/Objective Assessment:   DX: CVA , left atrial mass, likely myxoma vs possible papillary fibroelastoma     Action/Plan:   Surgery planned for 12-24-11   Anticipated DC Date:  12/27/2011   Anticipated DC Plan:  IP REHAB FACILITY         Choice offered to / List presented to:             Status of service:   Medicare Important Message given?   (If response is "NO", the following Medicare IM given date fields will be blank) Date Medicare IM given:   Date Additional Medicare IM given:    Discharge Disposition:    Per UR Regulation:  Reviewed for med. necessity/level of care/duration of stay  If discussed at Long Length of Stay Meetings, dates discussed:    Comments:  12/25/11 Javarian Jakubiak,RN,BSN 1510 PT S/P LT ATRIAL MYXOMA EXCISION ON 12/24/11 WITH STROKE PROPERATIVELY.  PHYSICAL THERAPY RECOMMENDING INPT REHAB. MD:  PLEASE LEAVE ORDER FOR REHAB CONSULT WHEN APPROPRIATE. THANKS. Jerrell Belfast, RN, BSN Phone #[phone]

## 2011-12-26 ENCOUNTER — Inpatient Hospital Stay (HOSPITAL_COMMUNITY): Payer: BC Managed Care – PPO

## 2011-12-26 DIAGNOSIS — I6330 Cerebral infarction due to thrombosis of unspecified cerebral artery: Secondary | ICD-10-CM

## 2011-12-26 LAB — BASIC METABOLIC PANEL
BUN: 13 mg/dL (ref 6–23)
CO2: 27 mEq/L (ref 19–32)
Chloride: 101 mEq/L (ref 96–112)
GFR calc non Af Amer: >90 mL/min (ref >90)
Glucose, Bld: 123 mg/dL — ABNORMAL HIGH (ref 70–99)
Potassium: 3.3 mEq/L — ABNORMAL LOW (ref 3.5–5.1)

## 2011-12-26 LAB — CBC
HCT: 27.1 % — ABNORMAL LOW (ref 36.0–46.0)
Hemoglobin: 8.7 g/dL — ABNORMAL LOW (ref 12.0–15.0)
MCH: 30.2 pg (ref 26.0–34.0)
MCHC: 32.1 g/dL (ref 30.0–36.0)
MCV: 94.1 fL (ref 78.0–100.0)
RBC: 2.88 MIL/uL — ABNORMAL LOW (ref 3.87–5.11)

## 2011-12-26 LAB — GLUCOSE, CAPILLARY
Glucose-Capillary: 80 mg/dL (ref 70–99)
Glucose-Capillary: 94 mg/dL (ref 70–99)

## 2011-12-26 MED ORDER — POTASSIUM CHLORIDE 10 MEQ/50ML IV SOLN
10.0000 meq | INTRAVENOUS | Status: AC
  Administered 2011-12-26 (×3): 10 meq via INTRAVENOUS

## 2011-12-26 MED ORDER — SODIUM CHLORIDE 0.45 % IV SOLN: INTRAVENOUS | Status: DC

## 2011-12-26 MED ORDER — POTASSIUM CHLORIDE 10 MEQ/50ML IV SOLN
INTRAVENOUS | Status: AC
  Administered 2011-12-26: 10 meq
  Filled 2011-12-26: qty 150

## 2011-12-26 MED FILL — Magnesium Sulfate Inj 50%: INTRAMUSCULAR | Qty: 10 | Status: AC

## 2011-12-26 MED FILL — Lidocaine HCl IV Inj 20 MG/ML: INTRAVENOUS | Qty: 5 | Status: AC

## 2011-12-26 MED FILL — Heparin Sodium (Porcine) Inj 1000 Unit/ML: INTRAMUSCULAR | Qty: 10 | Status: AC

## 2011-12-26 MED FILL — Potassium Chloride Inj 2 mEq/ML: INTRAVENOUS | Qty: 40 | Status: AC

## 2011-12-26 MED FILL — Mannitol IV Soln 20%: INTRAVENOUS | Qty: 500 | Status: AC

## 2011-12-26 MED FILL — Sodium Chloride Irrigation Soln 0.9%: Qty: 3000 | Status: AC

## 2011-12-26 MED FILL — Sodium Chloride IV Soln 0.9%: INTRAVENOUS | Qty: 1000 | Status: AC

## 2011-12-26 MED FILL — Electrolyte-R (PH 7.4) Solution: INTRAVENOUS | Qty: 3000 | Status: AC

## 2011-12-26 MED FILL — Sodium Bicarbonate IV Soln 8.4%: INTRAVENOUS | Qty: 50 | Status: AC

## 2011-12-26 MED FILL — Heparin Sodium (Porcine) Inj 1000 Unit/ML: INTRAMUSCULAR | Qty: 30 | Status: AC

## 2011-12-26 NOTE — Consult Note (Signed)
Physical Medicine and Rehabilitation Consult Reason for Consult: Left anterior circulation infarct Referring Phsyician: Dr. Sabino Snipes is an 61 y.o. female.   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 Admitted 12/14/2011 was unremarkable past medical history presented with altered mental status and slurred speech. EMS was called by family with noted blood sugar of 34. She was given an amp of D50 with repeat  blood sugars of 54. She had initial cranial CT scan that was negative. MRI of the brain showed moderate size acute left hemispheric infarction appeared to be secondary to acute left ICA carotid artery occlusion. CT angiogram head shows acute nonhemorrhagic infarct left basal ganglia and corona radiata. CT angiogram neck with abnormal appearance of the left ICA artery with appearance of dissection with complete occlusion at the C2 level. Carotid Dopplers with left eyes see a occlusion. Echocardiogram with ejection fraction 65% with no source of embolus. Noted left atrial mass attached to intra-atrial septum highly suggestive of myxoma. TEE identifies 1.7 x 1.7 cm mass left atrial septum again felt to be atrial myxoma. Cardiac catheterization 12/18/2011 with normal coronary arteries. Underwent excision of left atrial myxoma 12/24/2011 per Dr. Barry Dienes. Maintained on aspirin therapy at the recommendations of neurology. Subcutaneous Lovenox initiated for deep vein thrombosis prophylaxis. Speech therapy evaluation 12/25/2011 notes deficits primarily with expression of thoughts and ideas evident with complex of the of information. Physical therapy as well as M.D. has requested physical medicine rehabilitation consult to consider inpatient rehabilitation services  Review of Systems  Musculoskeletal: Positive for myalgias.  Neurological: Positive for speech change.  All other systems reviewed and are negative.   Past Medical History    Diagnosis Date  . Upper respiratory tract infection   . ICAO (internal carotid artery occlusion), left 12/15/2011  . 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 CVA (cerebral infarction) 12/15/2011  . Hypoglycemia 12/14/2011   History reviewed. No pertinent past surgical history. Family History  Problem Relation Age of Onset  . Coronary artery disease Neg Hx    Social History:  reports that she quit smoking about 9 years ago. She has never used smokeless tobacco. She reports that she does not drink alcohol or use illicit drugs. Allergies: No Known Allergies Medications Prior to Admission  Medication Sig Dispense Refill  . albuterol (PROVENTIL HFA;VENTOLIN HFA) 108 (90 BASE) MCG/ACT inhaler Inhale 2 puffs into the lungs every 6 (six) hours as needed. For wheezing      . predniSONE (DELTASONE) 10 MG tablet Take 10 mg by mouth daily. 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 Functional Status:  Mobility: Bed Mobility Bed Mobility: Supine to Sit;Sitting - Scoot to Edge of Bed Supine to Sit: 1: +2 Total assist Supine to Sit: Patient Percentage: 50% Sitting - Scoot to Edge of Bed: 4: Min assist Sit to Sidelying Left: Not Tested (comment) Transfers Transfers: Sit to Stand;Stand to [employer] Transfers Sit to Stand:  1: +2 Total  assist;With upper extremity assist;From bed Sit to Stand: Patient Percentage: 50% Stand to Sit: 1: +2 Total assist;With upper extremity assist;With armrests;To chair/3-in-1 Stand to Sit: Patient Percentage: 50% Stand Pivot Transfers: 1: +2 Total assist Stand Pivot Transfers: Patient Percentage: 40% Ambulation/Gait Ambulation/Gait Assistance: Not tested (comment) Ambulation/Gait: Patient Percentage: 60 Ambulation Distance (Feet): 4 Feet Assistive device: 2 person hand held assist Ambulation/Gait Assistance Details: Pt able to take 3 side steps to recliner with tactile cues for proper weight shifts to advance LE with extra time to advance RLE toward recliner but able to advance RLE without assist. Gait Pattern: Within Functional Limits Stairs: No Wheelchair Mobility Wheelchair Mobility: No  ADL: ADL Grooming: Performed;Teeth care;Minimal assistance;Moderate assistance;Wash/dry face Where Assessed - Grooming: Standing at sink Upper Body Bathing: Simulated;Minimal assistance Where Assessed - Upper Body Bathing: Sitting, bed Lower Body Bathing: Simulated;Maximal assistance Where Assessed - Lower Body Bathing: Sitting, bed Upper Body Dressing: Performed;Minimal assistance Where Assessed - Upper Body Dressing: Sitting, bed Lower Body Dressing: Performed;Maximal assistance Where Assessed - Lower Body Dressing: Sitting, bed Toilet Transfer: Simulated;Moderate assistance Toilet Transfer Method: Stand pivot Toilet Transfer Equipment: Other (comment) (recliner) Equipment Used: Gait belt Ambulation Related to ADLs: NA ADL Comments: Pt. feeling dizzy today and per RN this has been going on for a little while. BP stable throughout and documented in note above. Pt. educated on Rt. UE exercises to complete throughout the day due to decreased coordination and control with movements. Pt. able to bring each fingertip to thumb with increased time on rt. hand. Pt. with minimal control with bringing hand  to mouth and encouraged pt. to continue rt. UE exercises throughout the day and pt. verbalized andemonstrated understanding of technique. Pt. completed ADL tasks standing at sink with UE support on countertop and mod assist for maintaining upright position due to left lateral lean to elicit rt. LE weightbearing. Pt.. incorporated use of Rt. UE during brushing teeth and washing face with mod verbal cues and min assist provided at rt. elbow to achieve the brushing motion for completing task due to rt. UE weakness.  Cognition: Cognition Overall Cognitive Status: Impaired Arousal/Alertness: Lethargic Orientation Level: Oriented to person;Oriented to place;Disoriented to time;Disoriented to situation;Other (comment) (easily reoriented to time and situation) Attention: Sustained Sustained Attention: Impaired Sustained Attention Impairment: Verbal basic Memory:  (To be further assessed) Memory Impairment: Decreased short term memory Decreased Short Term Memory: Verbal basic Awareness: Impaired Awareness Impairment: Emergent impairment;Intellectual impairment;Anticipatory impairment (Emergent is improving from last week) Problem Solving: Impaired Problem Solving Impairment: Functional complex Safety/Judgment: Impaired Cognition Overall Cognitive Status: Difficult to assess Area of Impairment: Problem solving Difficult to assess due to: Impaired communication Arousal/Alertness: Lethargic Orientation Level: Appears intact for tasks assessed Behavior During Session: [facility] for tasks performed Following Commands: Follows one step commands inconsistently Problem Solving: Pt slow to problem solve and needs extra time to complete task Cognition - Other Comments: pt with very short responses to questions, but all responses appropriate to task and pt followed all directions.    Blood pressure 106/60, pulse 78, temperature 100.2 F (37.9 C), temperature source Oral, resp. rate 26, height 5\' 3"  (1.6 m),  weight 98.7 kg (217 lb 9.5 oz), SpO2 100.00%. Physical Exam  Constitutional: She is oriented to person, place, and time. She appears well-developed.  HENT:  Head: Normocephalic and atraumatic.  Eyes: Conjunctivae are normal. Pupils are equal, round, and reactive to light.  Neck: Neck supple. No thyromegaly present.  Cardiovascular:  Cardiac rate control  Pulmonary/Chest: Breath sounds normal. She has no wheezes.       Chest tube in place  Abdominal: She exhibits no distension. There is no tenderness.  Musculoskeletal: She exhibits no edema.  Neurological: She is alert and oriented to person, place, and time.       Mood is flat and made good eye contact with examiner. She names person place and situation. She has some difficulty with hospital course. Follows 3 step commands. Right facial droop and tongue deviation. No sensory deficits. Strength grossly 2/5 RUE and 1/5 prox RLE to tr-1 distally.  No gross visual spatial deficits. Fair insight, awareness, and memory.  Skin: Skin is warm and dry.  Psychiatric:       Very flat affect    Results for orders placed during the hospital encounter of 12/14/11 (from the past 24 hour(s))  GLUCOSE, CAPILLARY     Status: Normal   Collection Time   12/25/11  7:36 AM      Component Value Range   Glucose-Capillary 98  70 - 99 (mg/dL)   Comment 1 Documented in Chart     Comment 2 Notify RN    GLUCOSE, CAPILLARY     Status: Abnormal   Collection Time   12/25/11 12:55 PM      Component Value Range   Glucose-Capillary 102 (*) 70 - 99 (mg/dL)   Comment 1 Notify RN     Comment 2 Documented in Chart    GLUCOSE, CAPILLARY     Status: Abnormal   Collection Time   12/25/11  3:34 PM      Component Value Range   Glucose-Capillary 103 (*) 70 - 99 (mg/dL)   Comment 1 Notify RN    MAGNESIUM     Status: Normal   Collection Time   12/25/11  5:00 PM      Component Value Range   Magnesium 2.1  1.5 - 2.5 (mg/dL)  CBC     Status: Abnormal   Collection Time    12/25/11  5:00 PM      Component Value Range   WBC 11.6 (*) 4.0 - 10.5 (K/uL)   RBC 2.82 (*) 3.87 - 5.11 (MIL/uL)   Hemoglobin 8.6 (*) 12.0 - 15.0 (g/dL)   HCT 09.8 (*) 11.9 - 46.0 (%)   MCV 96.1  78.0 - 100.0 (fL)   MCH 30.5  26.0 - 34.0 (pg)   MCHC 31.7  30.0 - 36.0 (g/dL)   RDW 14.7  82.9 - 56.2 (%)   Platelets 226  150 - 400 (K/uL)  CREATININE, SERUM     Status: Normal   Collection Time   12/25/11  5:00 PM      Component Value Range   Creatinine, Ser 0.68  0.50 - 1.10 (mg/dL)   GFR calc non Af Amer >90  >90 (mL/min)   GFR calc Af Amer >90  >90 (mL/min)  POCT I-STAT, CHEM 8     Status: Abnormal   Collection Time   12/25/11  5:25 PM      Component Value Range   Sodium 140  135 - 145 (mEq/L)   Potassium 3.8  3.5 - 5.1 (mEq/L)   Chloride 103  96 - 112 (mEq/L)   BUN 16  6 - 23 (mg/dL)   Creatinine, Ser 1.30  0.50 - 1.10 (mg/dL)   Glucose, Bld 865 (*) 70 - 99 (mg/dL)   Calcium, Ion 7.84  6.96 - 1.32 (mmol/L)   TCO2 25  0 - 100 (mmol/L)   Hemoglobin 8.5 (*) 12.0 - 15.0 (g/dL)   HCT 91.4 (*) 78.2 - 46.0 (%)  GLUCOSE, CAPILLARY     Status: Normal   Collection Time   12/25/11  7:41 PM      Component Value Range   Glucose-Capillary 92  70 - 99 (mg/dL)  BASIC METABOLIC PANEL     Status: Abnormal   Collection Time   12/26/11  4:30 AM      Component Value Range   Sodium 136  135 - 145 (mEq/L)   Potassium 3.3 (*) 3.5 - 5.1 (mEq/L)   Chloride 101  96 - 112 (mEq/L)   CO2 27  19 - 32 (mEq/L)   Glucose, Bld 123 (*) 70 - 99 (mg/dL)   BUN 13  6 - 23 (mg/dL)   Creatinine, Ser 9.56  0.50 - 1.10 (mg/dL)   Calcium 8.5  8.4 - 21.3 (mg/dL)   GFR calc non Af Amer >90  >90 (mL/min)   GFR calc Af Amer >90  >90 (mL/min)  CBC     Status: Abnormal   Collection Time   12/26/11  4:30 AM      Component Value Range   WBC 14.2 (*) 4.0 - 10.5 (K/uL)   RBC 2.88 (*) 3.87 - 5.11 (MIL/uL)   Hemoglobin 8.7 (*) 12.0 - 15.0 (g/dL)   HCT 08.6 (*) 57.8 - 46.0 (%)   MCV 94.1  78.0 - 100.0 (fL)   MCH 30.2  26.0 -  34.0 (pg)   MCHC 32.1  30.0 - 36.0 (g/dL)   RDW 46.9  62.9 - 52.8 (%)   Platelets 239  150 - 400 (K/uL)   Dg Chest Portable 1 View In Am  12/25/2011  *RADIOLOGY REPORT*  Clinical Data: Status post heart surgery.  PORTABLE CHEST - 1 VIEW  Comparison: Chest x-ray 12/24/2011.  Findings: There is one right-sided chest tube in place with tip and side port projecting over the right hemithorax.  A pericardial drain is also seen in place at the base of the thorax. Previously noted endotracheal tube has been removed.  There is a left-sided internal jugular central venous catheter with tip terminating in the left innominate vein. Lung volumes are low.  There are increasing bibasilar opacities, favored to reflect areas of postoperative subsegmental atelectasis with superimposed small bilateral pleural effusions.  No definite pneumothorax.  Pulmonary venous congestion.  Some interstitial prominence in the mid and lower lungs may reflect areas of mild interstitial edema.  Heart size is within normal limits. The patient is rotated to the left on today's exam, resulting in distortion of the mediastinal contours and reduced diagnostic sensitivity and specificity for mediastinal pathology.  IMPRESSION: 1.  Support apparatus, as above. 2.  Low lung volumes with worsening bibasilar aeration likely reflect increasing atelectasis with superimposed small bilateral pleural effusions. 3.  There may be some mild interstitial pulmonary edema as well.  Original Report Authenticated By: Florencia Reasons, M.D.   Dg Chest Portable 1 View  12/24/2011  *RADIOLOGY REPORT*  Clinical Data: Post op  PORTABLE CHEST - 1 VIEW  Comparison: Portable exam 1452 hours compared to 12/20/2011  Findings: Tip of endotracheal tube approximately 3 mm above carina directed into the right mainstem bronchus, recommend withdrawal 2 cm. Mild enlargement of cardiac silhouette. Nasogastric tube extends into stomach. Left jugular central venous catheter, tip  projecting over left mediastinum and superior aspect of aortic arch, unable to confirm position in this setting; recommend clinical  confirmation of intravenous position. Pair of right thoracostomy tubes. Bibasilar atelectasis greater on right. No gross pleural effusion or pneumothorax. Question minimal perihilar edema.  IMPRESSION: Bibasilar atelectasis, greater on the right. Question minimal perihilar edema. Tip of endotracheal tube is 3 mm from carina directed into the right mainstem bronchus, recommend withdrawal 2 cm. Recommend clinical confirmation of the position of the left jugular catheter as above.  Position of ET tube and recommendation for repositioning called to [facility] on [address] at 1506 hours on 12/24/2011.  Original Report Authenticated By: Lollie Marrow, M.D.    Assessment/Plan: Diagnosis: left MCA territory infarct likely d/t LICA disease 1. Does the need for close, 24 hr/day medical supervision in concert with the patient's rehab needs make it unreasonable for this patient to be served in a less intensive setting? Yes 2. Co-Morbidities requiring supervision/potential complications: anemia, atrial myxoma 3. Due to bladder management, bowel management, safety, skin/wound care, disease management, medication administration, pain management and patient education, does the patient require 24 hr/day rehab nursing? Yes 4. Does the patient require coordinated care of a physician, rehab nurse, PT (1-2 hrs/day, 5 days/week), OT (1-2 hrs/day, 5 days/week) and SLP (1 hrs/day, 5 days/week) to address physical and functional deficits in the context of the above medical diagnosis(es)? Yes Addressing deficits in the following areas: balance, endurance, locomotion, strength, transferring, bowel/bladder control, bathing, dressing, feeding, grooming, toileting, cognition and psychosocial support 5. Can the patient actively participate in an intensive therapy program of at least 3 hrs of therapy per day  at least 5 days per week? Yes and Potentially 6. The potential for patient to make measurable gains while on inpatient rehab is excellent 7. Anticipated functional outcomes upon discharge from inpatient rehab are min asssist to supervision with PT, min to mod assist with OT, mod I with SLP. 8. Estimated rehab length of stay to reach the above functional goals is: 3 weeks 9. Does the patient have adequate social supports to accommodate these discharge functional goals? Potentially 10. Anticipated D/C setting: Home 11. Anticipated post D/C treatments: Outpt therapy 12. Overall Rehab/Functional Prognosis: excellent  RECOMMENDATIONS: This patient's condition is appropriate for continued rehabilitative care in the following setting: CIR Patient has agreed to participate in recommended program. Yes Note that insurance prior authorization may be required for reimbursement for recommended care.  Comment: Need to establish social supports (husband works)   Ivory Broad, MD 12/26/2011

## 2011-12-26 NOTE — Progress Notes (Signed)
Physical Therapy Treatment Patient Details Name: Courtney Faulkner MRN: [id number] DOB: 12/27/1950 Today's Date: 12/26/2011 Time: 1191-4782 PT Time Calculation (min): 26 min  PT Assessment / Plan / Recommendation Comments on Treatment Session  Pt s/p LCVA and minimally invasive myxoma removal with BP 92/50(61) sitting, 68/51 (55) standing first trial 1 min, 91/55 (64) sitting, second standing trial 93/74 (79) for 1.5 min. Pt with seated period of bil LE exercise between standing trials to try to elevate BP. Pt with impaired cognition coupled with aphasia. Pt encouraged to continue bil LE exercise throughout the day and nursing notified of transfer technique and knee buckling.     Follow Up Recommendations       Equipment Recommendations       Frequency     Plan Discharge plan remains appropriate;Frequency remains appropriate    Precautions / Restrictions Precautions Precautions: Fall Precaution Comments: limited RUE movement- pt with Lt CVA and is s/p minimally invasive myxoma removal, right knee buckles Restrictions Weight Bearing Restrictions: No   Pertinent Vitals/Pain No pain reported  orthostasis with initial standing    Mobility  Transfers Transfers: Sit to Stand;Stand to Sit Sit to Stand: 1: +2 Total assist;From chair/3-in-1;With armrests Sit to Stand: Patient Percentage: 60% Stand to Sit: 1: +2 Total assist;With armrests;To chair/3-in-1 Stand to Sit: Patient Percentage: 60% Details for Transfer Assistance: Pt stood x  2 approximately 1 min and 1.5 min respectively limited by dizziness and orthostasis Ambulation/Gait Ambulation/Gait Assistance: Not tested (comment)    Exercises General Exercises - Lower Extremity Long Arc Quad: AROM;AAROM;Right;Left;20 reps;Seated (AAROM on right, AROm on left) Hip Flexion/Marching: AROM;AAROM;Other reps (comment);Seated;Right;Left (20 reps AAROm on right, AROM on left)   PT Goals Acute Rehab PT Goals PT Goal: Sit to Stand -  Progress: Progressing toward goal PT Goal: Stand to Sit - Progress: Progressing toward goal PT Transfer Goal: Bed to Chair/Chair to Bed - Progress: Progressing toward goal  Visit Information  Last PT Received On: 12/26/11 Assistance Needed: +2 PT/OT Co-Evaluation/Treatment: Yes    Subjective Data  Subjective: its september   Cognition  Overall Cognitive Status: Impaired Area of Impairment: Memory;Attention Arousal/Alertness: Lethargic Orientation Level: Disoriented to;Time;Situation Behavior During Session: Lethargic Current Attention Level: Focused Memory Deficits: Pt unable to recall surgery, month, birth year Following Commands: Follows one step commands consistently Problem Solving: slow to respond    Balance  Static Sitting Balance Static Sitting - Balance Support: Feet supported;No upper extremity supported Static Sitting - Level of Assistance: 4: Min Oncologist Standing - Balance Support: Bilateral upper extremity supported Static Standing - Level of Assistance: 1: +2 Total assist Static Standing - Comment/# of Minutes: pt maintaining head down and eyes closed despite multiple cues with bil HHA and right knee blocked  End of Session PT - End of Session Equipment Utilized During Treatment: Gait belt Activity Tolerance: Patient limited by fatigue Patient left: in chair;with call bell/phone within reach Nurse Communication: Mobility status;Other (comment) (orthostasis with initial stand)    Delorse Lek 12/26/2011, 9:25 AM Delaney Meigs, PT [phone]

## 2011-12-26 NOTE — Progress Notes (Signed)
2 Days Post-Op Procedure(s) (LRB): MINIMALLY INVASIVE EXCISION OF ATRIAL MYXOMA (N/A) Subjective: No complaints at present   Objective: Vital signs in last 24 hours: Temp:  [98.3 F (36.8 C)-100.2 F (37.9 C)] 99.5 F (37.5 C) (05/02 0734) Pulse Rate:  [68-115] 115  (05/02 0820) Cardiac Rhythm:  [-] Normal sinus rhythm (05/02 0000) Resp:  [24-29] 28  (05/02 0600) BP: (68-112)/(46-82) 68/51 mmHg (05/02 0820) SpO2:  [99 %-100 %] 100 % (05/02 0820) Arterial Line BP: (104-151)/(47-82) 104/47 mmHg (05/01 1400) Weight:  [211 lb 13.8 oz (96.1 kg)] 211 lb 13.8 oz (96.1 kg) (05/02 0600)  Hemodynamic parameters for last 24 hours:    Intake/Output from previous day: 05/01 0701 - 05/02 0700 In: 1414.9 [P.O.:300; I.V.:908.9; IV Piggyback:206] Out: 5284 [XLKGM:0102; Chest Tube:640] Intake/Output this shift:    General appearance: alert Heart: regular rate and rhythm Lungs: diminished breath sounds bibasilar  Lab Results:  Basename 12/26/11 0430 12/25/11 1725 12/25/11 1700  WBC 14.2* -- 11.6*  HGB 8.7* 8.5* --  HCT 27.1* 25.0* --  PLT 239 -- 226   BMET:  Basename 12/26/11 0430 12/25/11 1725 12/25/11 0432  NA 136 140 --  K 3.3* 3.8 --  CL 101 103 --  CO2 27 -- 24  GLUCOSE 123* 100* --  BUN 13 16 --  CREATININE 0.64 0.90 --  CALCIUM 8.5 -- 8.2*    PT/INR:  Basename 12/24/11 1435  LABPROT 17.7*  INR 1.43   ABG    Component Value Date/Time   PHART 7.378 12/24/2011 2327   HCO3 24.0 12/24/2011 2327   TCO2 25 12/25/2011 1725   ACIDBASEDEF 1.0 12/24/2011 2327   O2SAT 99.0 12/24/2011 2327   CBG (last 3)   Basename 12/26/11 0733 12/25/11 1941 12/25/11 1534  GLUCAP 81 92 103*    Assessment/Plan: S/P Procedure(s) (LRB): MINIMALLY INVASIVE EXCISION OF ATRIAL MYXOMA (N/A) POD # 2 CV- BP relatively low, will follow  Amiodarone started PO, stop IV Neuro- stroke preop, continue PT/OT/Speech RENAL- voluma overloaded, but BP too low to aggressively diurese RESP- pulmonary  toilet   LOS: 12 days    Ezekiah Massie C 12/26/2011

## 2011-12-26 NOTE — Progress Notes (Signed)
2 Days Post-Op Procedure(s) (LRB): MINIMALLY INVASIVE EXCISION OF ATRIAL MYXOMA (N/A) Subjective: No complaints  Objective: Vital signs in last 24 hours: Temp:  [98.3 F (36.8 C)-100.2 F (37.9 C)] 99.4 F (37.4 C) (05/02 1214) Pulse Rate:  [51-115] 94  (05/02 1715) Cardiac Rhythm:  [-] Normal sinus rhythm (05/02 0800) Resp:  [24-34] 29  (05/02 1715) BP: (68-112)/(30-82) 90/55 mmHg (05/02 1715) SpO2:  [95 %-100 %] 100 % (05/02 1715) Weight:  [96.1 kg (211 lb 13.8 oz)] 96.1 kg (211 lb 13.8 oz) (05/02 0600)  Hemodynamic parameters for last 24 hours:    Intake/Output from previous day: 05/01 0701 - 05/02 0700 In: 1414.9 [P.O.:300; I.V.:908.9; IV Piggyback:206] Out: 8119 [JYNWG:9562; Chest Tube:640] Intake/Output this shift: Total I/O In: 366.7 [I.V.:166.7; IV Piggyback:200] Out: 400 [Urine:310; Chest Tube:90]  General appearance: lethargic Neurologic: right sided weakness Heart: regular rate and rhythm, S1, S2 normal, no murmur, click, rub or gallop Lungs: clear to auscultation bilaterally Extremities: extremities normal, atraumatic, no cyanosis or edema Wound: dressing dry  Lab Results:  Basename 12/26/11 0430 12/25/11 1725 12/25/11 1700  WBC 14.2* -- 11.6*  HGB 8.7* 8.5* --  HCT 27.1* 25.0* --  PLT 239 -- 226   BMET:  Basename 12/26/11 0430 12/25/11 1725 12/25/11 0432  NA 136 140 --  K 3.3* 3.8 --  CL 101 103 --  CO2 27 -- 24  GLUCOSE 123* 100* --  BUN 13 16 --  CREATININE 0.64 0.90 --  CALCIUM 8.5 -- 8.2*    PT/INR:  Basename 12/24/11 1435  LABPROT 17.7*  INR 1.43   ABG    Component Value Date/Time   PHART 7.378 12/24/2011 2327   HCO3 24.0 12/24/2011 2327   TCO2 25 12/25/2011 1725   ACIDBASEDEF 1.0 12/24/2011 2327   O2SAT 99.0 12/24/2011 2327   CBG (last 3)   Basename 12/26/11 1550 12/26/11 1212 12/26/11 0733  GLUCAP 79 80 81    Assessment/Plan: S/P Procedure(s) (LRB): MINIMALLY INVASIVE EXCISION OF ATRIAL MYXOMA (N/A) BP is borderline so will  dc lopressor.  Continue amiodarone. Physical therapy. Will need inpt rehab. CT output decreasing.  Will probably be able to remove tomorrow.   LOS: 12 days    Courtney Faulkner K 12/26/2011

## 2011-12-26 NOTE — Progress Notes (Signed)
Patient Name: Courtney Faulkner Date of Encounter: 12/26/2011    SUBJECTIVE: Courtney Faulkner is not having pain. She is somewhat frustrated by her inability to sleep due to repetitive interruptions. She denies dyspnea. She is weak when she stands or  TELEMETRY:  Normal sinus rhythm: Filed Vitals:   12/26/11 1445 12/26/11 1500 12/26/11 1515 12/26/11 1600  BP:  107/60  94/51  Pulse: 88 92 87 72  Temp:      TempSrc:      Resp: 29 30 28 29   Height:      Weight:      SpO2: 100% 100% 95% 100%    Intake/Output Summary (Last 24 hours) at 12/26/11 1628 Last data filed at 12/26/11 1600  Gross per 24 hour  Intake 1122.83 ml  Output   2880 ml  Net -1757.17 ml    LABS: Basic Metabolic Panel:  Basename 12/26/11 0430 12/25/11 1725 12/25/11 1700 12/25/11 0432  NA 136 140 -- --  K 3.3* 3.8 -- --  CL 101 103 -- --  CO2 27 -- -- 24  GLUCOSE 123* 100* -- --  BUN 13 16 -- --  CREATININE 0.64 0.90 -- --  CALCIUM 8.5 -- -- 8.2*  MG -- -- 2.1 2.3  PHOS -- -- -- --   CBC:  Basename 12/26/11 0430 12/25/11 1725 12/25/11 1700  WBC 14.2* -- 11.6*  NEUTROABS -- -- --  HGB 8.7* 8.5* --  HCT 27.1* 25.0* --  MCV 94.1 -- 96.1  PLT 239 -- 226   PATHOLOGY: FINAL DIAGNOSIS Diagnosis Heart, biopsy, Left atrial - BENIGN CARDIAC ATRIAL MYXOMA. [place] RUND DO Pathologist, Electronic Signature  Radiology/Studies:  CXR: IMPRESSION:  Low lung volumes with associated bibasilar volume loss. Little  interval change in the overall pulmonary vascular congestion.  Original Report Authenticated By: Bertha Stakes, M.D.  Physical Exam: Blood pressure 94/51, pulse 72, temperature 99.4 F (37.4 C), temperature source Oral, resp. rate 29, height 5\' 3"  (1.6 m), weight 96.1 kg (211 lb 13.8 oz), SpO2 100.00%. Weight change: 4.7 kg (10 lb 5.8 oz)   No paracardial rub is heard. No gallop or murmur is audible.  Decreased breath sounds are heard posteriorly. No rales.  Weak right arm right facial droop and weak  right leg. Not significantly different than previous surgery. Follows commands appropriately  ASSESSMENT:  1. Status post excision of left atrial myxoma, confirmed by pathology.  2. No postoperative arrhythmias to this point  3. Amiodarone therapy is continued. We will send her home on this therapy.  4. 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    Plan:  1. Continue to follow for rhythm disturbance  2. Inpatient rehabilitation to improve functionality prior to discharge home. This will help from the standpoint of surgical recuperation as well as stroke rehabilitation  Signed, Lesleigh Noe 12/26/2011, 4:28 PM

## 2011-12-26 NOTE — Progress Notes (Signed)
Occupational Therapy Treatment Patient Details Name: Courtney Faulkner MRN: [id number] DOB: [birth year] Today's Date: 12/26/2011 Time: 7829-5621 OT Time Calculation (min): 25 min  OT Assessment / Plan / Recommendation Comments on Treatment Session Pt limited by orthostasis today and lethargy    Follow Up Recommendations  Inpatient Rehab    Equipment Recommendations  Defer to next venue    Frequency Min 3X/week   Plan Discharge plan remains appropriate    Precautions / Restrictions Precautions Precautions: Fall Precaution Comments: limited RUE movement- pt with Lt CVA and is s/p minimally invasive myxoma removal, right knee buckles Restrictions Weight Bearing Restrictions: No   Pertinent Vitals/Pain No c/o pain    ADL  Eating/Feeding: Not assessed Grooming: Performed;Minimal assistance;Wash/dry face Where Assessed - Grooming: Supported sitting Upper Body Bathing: Simulated;Maximal assistance Where Assessed - Upper Body Bathing: Sitting, chair Lower Body Bathing: Simulated;+1 Total assistance Where Assessed - Lower Body Bathing: Sit to stand from chair Upper Body Dressing: Simulated;Moderate assistance Where Assessed - Upper Body Dressing: Sitting, chair Lower Body Dressing: Simulated;+1 Total assistance Where Assessed - Lower Body Dressing: Sit to stand from chair Toilet Transfer: Not assessed Toileting - Clothing Manipulation: Simulated;+1 Total assistance Where Assessed - Toileting Clothing Manipulation: Standing Toileting - Hygiene: Not assessed Tub/Shower Transfer: Not assessed Equipment Used: Gait belt Ambulation Related to ADLs: NT. 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 Patient Stated Goal: decreased dizziness   Prior Functioning  Home Living Lives With: Spouse Available Help at Discharge: Family;Friend(s) Type of Home: House Home Access: Stairs to enter [employer] of Steps: 3 Entrance Stairs-Rails: None Home Layout: Two level;Bed/bath upstairs;Full bath on main level Alternate Level Stairs-Number of Steps: 12 Alternate Level Stairs-Rails: Right;Left Bathroom Shower/Tub: Health visitor: Standard Bathroom Accessibility: Yes How Accessible: Accessible via walker Home Adaptive Equipment: None Prior Function Level of Independence: Independent Able to Take Stairs?: Yes Driving: Yes 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 Dominant Hand: Right    Cognition  Overall Cognitive Status: Impaired Area of Impairment: Memory;Attention Arousal/Alertness: Lethargic Orientation Level: Disoriented to;Time;Situation Behavior During Session: Lethargic Current Attention Level: Focused Memory Deficits: Pt unable to recall surgery, month, birth year Following Commands: Follows one step commands consistently Problem Solving: slow to respond    Mobility Bed Mobility Bed Mobility: Not assessed Transfers Sit to Stand: 1: +2 Total assist;From chair/3-in-1;With armrests Sit to Stand: Patient Percentage: 60% Stand to Sit: 1: +2 Total assist;With armrests;To chair/3-in-1 Stand to Sit: Patient Percentage: 60% Details for Transfer Assistance: Pt stood x  2 approximately 1 min and 1.5 min respectively limited by dizziness and orthostasis   Exercises General Exercises - Lower Extremity Long Arc Quad: AROM;AAROM;Right;Left;20 reps;Seated (AAROM on right, AROm on left) Hip Flexion/Marching:  AROM;AAROM;Other reps (comment);Seated;Right;Left (20 reps AAROm on right, AROM on left) Shoulder Exercises Shoulder Flexion: AAROM;Both;10 reps;Seated Elbow Flexion: AAROM;Both;10 reps;Seated Wrist Flexion: AAROM;Both;10 reps;Seated Digit Composite Flexion: AAROM;10 reps;Both;Seated Hand Exercises Forearm Supination: AAROM;10 reps;Both;Seated Forearm Pronation: AAROM;Both;10 reps;Seated  Balance Static Sitting Balance Static Sitting - Balance Support:  Feet supported;No upper extremity supported Static Sitting - Level of Assistance: 4: Min Oncologist Standing - Balance Support: Bilateral upper extremity supported Static Standing - Level of Assistance: 1: +2 Total assist Static Standing - Comment/# of Minutes: pt maintaining head down and eyes closed despite multiple cues with bil HHA and manual facilitation at anterior chest/shoulders and posterior hips; right knee blocked  End of Session OT - End of Session Equipment Utilized During Treatment: Gait belt Activity Tolerance: Patient limited by fatigue Patient left: in chair;with call bell/phone within reach Nurse Communication: Mobility status   Luisdavid Hamblin 12/26/2011, 9:33 AM

## 2011-12-26 NOTE — Progress Notes (Signed)
Subjective:  The patient is s/p excision of left atrial myxoma, day two.  Dr. Cornelius Moras is primary, we will continue to consult for hypoglycemia.  Patient is off dextrose gttp since 12/23/11. Her CBG is stabilized at 80 to 132.   Objective: Vital signs in last 24 hours: Filed Vitals:   12/26/11 0500 12/26/11 0600 12/26/11 0734 12/26/11 0820  BP: 104/59 112/82  68/51  Pulse: 86 90  115  Temp:  98.3 F (36.8 C) 99.5 F (37.5 C)   TempSrc:   Oral   Resp: 26 28    Height:      Weight:  211 lb 13.8 oz (96.1 kg)    SpO2: 100% 100%  100%   Weight change: 10 lb 5.8 oz (4.7 kg)  Intake/Output Summary (Last 24 hours) at 12/26/11 1042 Last data filed at 12/26/11 0815  Gross per 24 hour  Intake 1119.63 ml  Output   3050 ml  Net -1930.37 ml    Physical Exam:  General: drowsy, arousalable,  and in no apparent distress HEENT: pupils equal round and reactive to light, vision grossly intact, oropharynx clear and non-erythematous, right lower facial droop noted. Neck: supple, no lymphadenopathy Lungs: clear to ascultation bilaterally, normal work of respiration, no wheezes, rales, ronchi Heart: regular rate and rhythm, no murmurs, gallops, or rubs Abdomen: soft, non-tender, non-distended, normal bowel sounds Extremities: no cyanosis, clubbing, or edema Neurologic: less drowsy and more oriented today.  Right lower facial droop noted, otherwise CN II-VI and VIII-XII intact, strength 4/5 in right shoulder flexion and adduction, 4-5/5 in right elbow flexion/extension, otherwise 5/5 throughout.  Expressive aphasia noted.  Lab Results: Basic Metabolic Panel:  Lab 12/26/11 4098 12/25/11 1725 12/25/11 1700 12/25/11 0432  NA 136 140 -- --  K 3.3* 3.8 -- --  CL 101 103 -- --  CO2 27 -- -- 24  GLUCOSE 123* 100* -- --  BUN 13 16 -- --  CREATININE 0.64 0.90 -- --  CALCIUM 8.5 -- -- 8.2*  MG -- -- 2.1 2.3  PHOS -- -- -- --   Liver Function Tests:  Lab 12/20/11 1818  AST 32  ALT 30  ALKPHOS 54    BILITOT 0.2*  PROT 6.5  ALBUMIN 2.4*   CBC:  Lab 12/26/11 0430 12/25/11 1725 12/25/11 1700  WBC 14.2* -- 11.6*  NEUTROABS -- -- --  HGB 8.7* 8.5* --  HCT 27.1* 25.0* --  MCV 94.1 -- 96.1  PLT 239 -- 226   CBG:  Lab 12/26/11 0733 12/25/11 1941 12/25/11 1534 12/25/11 1255 12/25/11 0736 12/25/11 0429  GLUCAP 81 92 103* 102* 98 108*   Hemoglobin A1C:  Lab 12/20/11 1818  HGBA1C 5.1   Coagulation:  Lab 12/24/11 1435 12/20/11 1818  LABPROT 17.7* 13.9  INR 1.43 1.05   Misc. Labs:  Parathyroid hormone: Pending  Proinsulin/Insulin Level: Pending  C-Peptide: 4.17 (H)  AM Cortisol: 10.9  Sed Rate: 46  ANA: Neg  Anti-DNA: 1 (Neg)  RF: <10  C3: 118  C4: 51  CRP: 4.85  ANCA: Neg  MPO/PR-3: <1   Micro Results: 4/20 - 1/2 blood cultures with coag-neg staph 4/23 - blood cultures negative  Studies/Results: Dg Chest Portable 1 View In Am  12/26/2011  *RADIOLOGY REPORT*  Clinical Data: Postop open heart surgery  PORTABLE CHEST - 1 VIEW  Comparison: 12/25/2011  Findings: There has been interval placement of a right subclavian CVP and the tip is located in the region of the distal superior vena cava.  The introducer sheath in the left jugular vein has been removed. A right-sided chest tube and pericardial drain are unchanged on the right.  Low lung volumes combined with an apical lordotic projection results in poor delineation of the cardiac silhouette.  The upper mediastinal contour and hilar regions appear unchanged with pulmonary vascular congestion again noted.  No definite residual pneumothorax is seen on the right. Bibasilar density likely related to atelectasis given the poor lung volumes is again seen and is largely unchanged.  No definite overt congestive failure is noted. A small left pleural effusion is suspected.  IMPRESSION: Low lung volumes with associated bibasilar volume loss.  Little interval change in the overall pulmonary vascular congestion.  Original Report  Authenticated By: Bertha Stakes, M.D.   Dg Chest Portable 1 View In Am  12/25/2011  *RADIOLOGY REPORT*  Clinical Data: Status post heart surgery.  PORTABLE CHEST - 1 VIEW  Comparison: Chest x-ray 12/24/2011.  Findings: There is one right-sided chest tube in place with tip and side port projecting over the right hemithorax.  A pericardial drain is also seen in place at the base of the thorax. Previously noted endotracheal tube has been removed.  There is a left-sided internal jugular central venous catheter with tip terminating in the left innominate vein. Lung volumes are low.  There are increasing bibasilar opacities, favored to reflect areas of postoperative subsegmental atelectasis with superimposed small bilateral pleural effusions.  No definite pneumothorax.  Pulmonary venous congestion.  Some interstitial prominence in the mid and lower lungs may reflect areas of mild interstitial edema.  Heart size is within normal limits. The patient is rotated to the left on today's exam, resulting in distortion of the mediastinal contours and reduced diagnostic sensitivity and specificity for mediastinal pathology.  IMPRESSION: 1.  Support apparatus, as above. 2.  Low lung volumes with worsening bibasilar aeration likely reflect increasing atelectasis with superimposed small bilateral pleural effusions. 3.  There may be some mild interstitial pulmonary edema as well.  Original Report Authenticated By: Florencia Reasons, M.D.   Dg Chest Portable 1 View  12/24/2011  *RADIOLOGY REPORT*  Clinical Data: Post op  PORTABLE CHEST - 1 VIEW  Comparison: Portable exam 1452 hours compared to 12/20/2011  Findings: Tip of endotracheal tube approximately 3 mm above carina directed into the right mainstem bronchus, recommend withdrawal 2 cm. Mild enlargement of cardiac silhouette. Nasogastric tube extends into stomach. Left jugular central venous catheter, tip projecting over left mediastinum and superior aspect of aortic arch,  unable to confirm position in this setting; recommend clinical confirmation of intravenous position. Pair of right thoracostomy tubes. Bibasilar atelectasis greater on right. No gross pleural effusion or pneumothorax. Question minimal perihilar edema.  IMPRESSION: Bibasilar atelectasis, greater on the right. Question minimal perihilar edema. Tip of endotracheal tube is 3 mm from carina directed into the right mainstem bronchus, recommend withdrawal 2 cm. Recommend clinical confirmation of the position of the left jugular catheter as above.  Position of ET tube and recommendation for repositioning called to [facility] on 2300 Unit at 1506 hours on 12/24/2011.  Original Report Authenticated By: Lollie Marrow, M.D.   Medications: I have reviewed the patient's current medications. Scheduled Meds:    . acetaminophen  1,000 mg Oral Q6H   Or  . acetaminophen (TYLENOL) oral liquid 160 mg/5 mL  975 mg Per Tube Q6H  . amiodarone  200 mg Oral BID PC  . aspirin EC  325 mg Oral Daily   Or  .  aspirin  324 mg Per Tube Daily  . bisacodyl  10 mg Oral Daily   Or  . bisacodyl  10 mg Rectal Daily  . cefUROXime (ZINACEF)  IV  1.5 g Intravenous Q12H  . docusate sodium  200 mg Oral Daily  . enoxaparin (LOVENOX) injection  40 mg Subcutaneous Q24H  . fluconazole  200 mg Oral Daily  . furosemide  20 mg Intravenous Q6H  . insulin aspart  0-24 Units Subcutaneous TID WC & HS  . metoprolol tartrate  25 mg Oral BID  . pantoprazole  40 mg Oral Q1200  . potassium chloride  10 mEq Intravenous Q1 Hr x 3  . potassium chloride      . sodium chloride  10-40 mL Intracatheter Q12H  . sodium chloride  3 mL Intravenous Q12H  . DISCONTD: insulin aspart  0-24 Units Subcutaneous Q4H   Continuous Infusions:    . sodium chloride 20 mL/hr at 12/26/11 0800  . sodium chloride    . amiodarone (NEXTERONE PREMIX) 360 mg/200 mL dextrose 30 mg/hr (12/26/11 0600)  . phenylephrine (NEO-SYNEPHRINE) Adult infusion 15 mcg/min (12/25/11 0700)   . DISCONTD: sodium chloride 20 mL/hr at 12/26/11 0000  . DISCONTD: sodium chloride    . DISCONTD: lactated ringers 20 mL/hr at 12/24/11 1900   PRN Meds:.metoprolol, morphine, ondansetron (ZOFRAN) IV, oxyCODONE, sodium chloride, sodium chloride  Assessment/Plan: The patient is a 61 yo woman, no prior significant medical history, presenting with weakness and dysphagia, found to have an acute CVA, likely secondary to atrial myxoma. S/p excision of left atrial myxoma, day two.  # Left atrial myxoma - POD #0 s/p resection -CVTS primary -awaiting pathology to determine etiology  # Recurrent hypoglycemia - Patient presented with hypoglycemia, which persisted during her first few days of hospitalization.  D5-NS drip discontinued 4/29.  Patient's cbg's currently well-controlled.  Elevated C-peptide, elevated proinsulin, normal insulin levels.  Differential includes insulinoma vs insulin autoimmune hypoglycemia vs infectious etiology (with myxoma as possible source).  CT and MRI abd showed no lesions (though MRI of poor quality due to respiratory motion).  GI consulted, considered EUS for insulinoma, but per CVTS not stable for procedure for several months post-op.  -pending insulin receptor antibodies -continue to monitor cbg's, now that patient is off of D5-NS and steroids. CBG has been stabilized at 80 to 132 since 12/21/21.  # Acute CVA - likely represents thrombus from atrial myxoma, also considering L ICA dissection (given CT neck results). - Neurology is on board -continue aspirin 81 daily - heparin drip is off on 4/29 -continue PT, OT,  -rehab consult  # Abnormal blood culture result - 1/2 blood cultures from 4/20 showing coag-neg staph, initially concerning for bacteremia, treated with vanc/ceftriaxone.  However, blood cultures 4/23 negative, so staph likely contaminant. -vanc/ceftriaxone discontinued 4/29  # Prophy - SCD's   LOS: 12 days   Lorretta Harp 12/26/2011, 10:42 AM

## 2011-12-26 NOTE — Progress Notes (Signed)
Stroke Team Progress Note  HISTORY Courtney Faulkner is an 62 y.o. female with dysarthria, aphasia and right sided weakness. According to son at bedside, his father heard her vaccuming at ~08:30 on 12/14/2011. He came down stairs at 11:30 and found her confused and dysarthric. EMS called and found BS 36. Patient was subsequently brought to the ED. She arrive at 1214. In the ED patient was given some orange juice and her blood CBG went to 166. Patient subsequently had a head CT done which was negative. Initially a code stroke was called however it was canceled secondary to patient's hypoglycemic spells. Per family patient was recently been treated for upper respiratory symptoms with prednisone and albuterol. Patient and family deny any recent fevers, no chills, no chest pain, no diarrhea, no constipation, no dysuria, no generalized weakness. Patient's family does endorse some shortness of breath on exertion when patient climbs up stairs which has been ongoing for the past 2 weeks. On exam and during the interview patient has some dysarthric speech is alert to self only and has an incoherent speech as well. After treatment, CBG 166, pt remains confused, and having difficulty following commands.  Neuro consult called by Dr. Janee Morn for this reason. Patient was not a TPA candidate secondary to presenting beyond the tPA window. She was transferred to the ICU after admission for hypoglycemia and neuro decline for further evaluation and treatment.  SUBJECTIVE Patient in chair at bedside. MD reports more expressive aphasia that prior to OR. Increased neuro deficits expected post OR. As long as mild worsening, expect improvement.  OBJECTIVE Most recent Vital Signs: Filed Vitals:   12/26/11 0400 12/26/11 0500 12/26/11 0600 12/26/11 0734  BP: 106/60 104/59 112/82   Pulse: 78 86 90   Temp: 100.2 F (37.9 C)  98.3 F (36.8 C) 99.5 F (37.5 C)  TempSrc:    Oral  Resp: 26 26 28    Height:      Weight:   96.1 kg  (211 lb 13.8 oz)   SpO2: 100% 100% 100%    CBG (last 3)  Basename 12/26/11 0733 12/25/11 1941 12/25/11 1534  GLUCAP 81 92 103*   Intake/Output from previous day: 05/01 0701 - 05/02 0700 In: 1414.9 [P.O.:300; I.V.:908.9; IV Piggyback:206] Out: 1610 [RUEAV:4098; Chest Tube:640]  IV Fluid Intake:     . sodium chloride 20 mL/hr at 12/26/11 0700  . sodium chloride    . amiodarone (NEXTERONE PREMIX) 360 mg/200 mL dextrose 30 mg/hr (12/26/11 0600)  . phenylephrine (NEO-SYNEPHRINE) Adult infusion 15 mcg/min (12/25/11 0700)  . DISCONTD: sodium chloride 20 mL/hr at 12/26/11 0000  . DISCONTD: sodium chloride    . DISCONTD: dexmedetomidine (PRECEDEX) IV infusion Stopped (12/24/11 1700)  . DISCONTD: lactated ringers 20 mL/hr at 12/24/11 1900  . DISCONTD: nitroGLYCERIN Stopped (12/24/11 1500)   MEDICATIONS    . acetaminophen  1,000 mg Oral Q6H   Or  . acetaminophen (TYLENOL) oral liquid 160 mg/5 mL  975 mg Per Tube Q6H  . amiodarone  200 mg Oral BID PC  . aspirin EC  325 mg Oral Daily   Or  . aspirin  324 mg Per Tube Daily  . bisacodyl  10 mg Oral Daily   Or  . bisacodyl  10 mg Rectal Daily  . cefUROXime (ZINACEF)  IV  1.5 g Intravenous Q12H  . docusate sodium  200 mg Oral Daily  . enoxaparin (LOVENOX) injection  40 mg Subcutaneous Q24H  . famotidine (PEPCID) IV  20 mg Intravenous Q12H  .  fluconazole  200 mg Oral Daily  . furosemide  20 mg Intravenous Q6H  . insulin aspart  0-24 Units Subcutaneous TID WC & HS  . metoprolol tartrate  25 mg Oral BID  . pantoprazole  40 mg Oral Q1200  . potassium chloride  10 mEq Intravenous Q1 Hr x 3  . potassium chloride  10 mEq Intravenous Q1 Hr x 3  . potassium chloride      . sodium chloride  10-40 mL Intracatheter Q12H  . sodium chloride  3 mL Intravenous Q12H  . DISCONTD: insulin aspart  0-24 Units Subcutaneous Q4H  . DISCONTD: insulin aspart  0-24 Units Subcutaneous Q4H   PRN:  metoprolol, morphine, ondansetron (ZOFRAN) IV, oxyCODONE,  sodium chloride, sodium chloride, DISCONTD: midazolam, DISCONTD: morphine  Diet:  Clear Liquid Activity:   ambulate DVT Prophylaxis:  Lovenox 40 mg sq daily   CLINICALLY SIGNIFICANT STUDIES CBC    Component Value Date/Time   WBC 14.2* 12/26/2011 0430   RBC 2.88* 12/26/2011 0430   HGB 8.7* 12/26/2011 0430   HCT 27.1* 12/26/2011 0430   PLT 239 12/26/2011 0430   MCV 94.1 12/26/2011 0430   MCH 30.2 12/26/2011 0430   MCHC 32.1 12/26/2011 0430   RDW 15.1 12/26/2011 0430   LYMPHSABS 1.9 12/18/2011 0303   MONOABS 0.9 12/18/2011 0303   EOSABS 0.3 12/18/2011 0303   BASOSABS 0.1 12/18/2011 0303   CMP    Component Value Date/Time   NA 136 12/26/2011 0430   K 3.3* 12/26/2011 0430   CL 101 12/26/2011 0430   CO2 27 12/26/2011 0430   GLUCOSE 123* 12/26/2011 0430   BUN 13 12/26/2011 0430   CREATININE 0.64 12/26/2011 0430   CALCIUM 8.5 12/26/2011 0430   PROT 6.5 12/20/2011 1818   ALBUMIN 2.4* 12/20/2011 1818   AST 32 12/20/2011 1818   ALT 30 12/20/2011 1818   ALKPHOS 54 12/20/2011 1818   BILITOT 0.2* 12/20/2011 1818   GFRNONAA >90 12/26/2011 0430   GFRAA >90 12/26/2011 0430   COAGS Lab Results  Component Value Date   INR 1.43 12/24/2011   INR 1.05 12/20/2011   INR 1.04 12/17/2011   Lipid Panel    Component Value Date/Time   CHOL 134 12/16/2011 0311   TRIG 90 12/16/2011 0311   HDL 45 12/16/2011 0311   CHOLHDL 3.0 12/16/2011 0311   VLDL 18 12/16/2011 0311   LDLCALC 71 12/16/2011 0311   HgbA1C  Lab Results  Component Value Date   HGBA1C 5.1 12/20/2011   Cardiac Panel (last 3 results)  No results found for this basename: CKTOTAL:3,CKMB:3,TROPONINI:3,RELINDX:3 in the last 72 hours Urinalysis    Component Value Date/Time   COLORURINE YELLOW 12/23/2011 1320   APPEARANCEUR CLOUDY* 12/23/2011 1320   LABSPEC 1.029 12/23/2011 1320   PHURINE 6.0 12/23/2011 1320   GLUCOSEU NEGATIVE 12/23/2011 1320   HGBUR NEGATIVE 12/23/2011 1320   BILIRUBINUR NEGATIVE 12/23/2011 1320   KETONESUR NEGATIVE 12/23/2011 1320   PROTEINUR NEGATIVE  12/23/2011 1320   UROBILINOGEN 0.2 12/23/2011 1320   NITRITE NEGATIVE 12/23/2011 1320   LEUKOCYTESUR MODERATE* 12/23/2011 1320   Urine Drug Screen  No results found for this basename: labopia,  cocainscrnur,  labbenz,  amphetmu,  thcu,  labbarb    Alcohol Level No results found for this basename: eth   VITAMIN B12     Status: Normal   Collection Time   12/15/11  9:00 AM      Component Value Range   Vitamin B-12 287  211 -  911 (pg/mL)  IRON AND TIBC     Status: Abnormal   Collection Time   12/15/11  9:00 AM      Component Value Range   Iron 39 (*) 42 - 135 (ug/dL)   TIBC 409 (*) 811 - 914 (ug/dL)   Saturation Ratios 17 (*) 20 - 55 (%)   UIBC 192  125 - 400 (ug/dL)  FERRITIN     Status: Normal   Collection Time   12/15/11  9:00 AM      Component Value Range   Ferritin 200  10 - 291 (ng/mL)  RETICULOCYTES     Status: Normal   Collection Time   12/15/11  9:00 AM      Component Value Range   Retic Ct Pct 2.9  0.4 - 3.1 (%)   RBC. 3.97  3.87 - 5.11 (MIL/uL)   Retic Count, Manual 115.1  19.0 - 186.0 (K/uL)  PRO B NATRIURETIC PEPTIDE     Status: Abnormal   Collection Time   12/15/11  9:00 AM      Component Value Range   Pro B Natriuretic peptide (BNP) 441.6 (*) 0 - 125 (pg/mL)   CT of the brain   12/15/2011  Developing cytotoxic edema correlates with the observed pattern of restricted diffusion on MR, representing acute infarction. This primarily involves the left basal ganglia.  Smaller areas of cortical and subcortical infarction seen on MRI in the left hemisphere are too small to be easily visible on CT.   No incipient uncal or transtentorial herniation, although minimal 1 mm left to right shift may be present.  No hemorrhagic transformation.    12/14/2011  Normal CT of the head without contrast.   CT Angio Head 12/16/2011  Acute non hemorrhagic infarct left basal ganglia, left uncus and left corona radiata.  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  Collateral flow to the left anterior  cerebral artery and left middle cerebral artery branches.  The flow within the left middle cerebral artery branches is diminished suggesting that collateral flow is not adequate.   CT Angio Neck 12/16/2011  Abnormal appearance of the left internal carotid artery with appearance of dissection with complete occlusion at the C2 level.  Abnormal appearance of the proximal left common carotid artery. Question if this is related to artifact versus dissection versus atherosclerotic type changes.  Mild narrowing proximal right common carotid artery.  Prominent ectasia of the proximal right common carotid artery with fold and mild narrowing.  Mild irregularity at the right carotid bifurcation without measurable stenosis.  Mild to moderate narrowing proximal right vertebral artery.  Mild irregularity throughout the course of the right vertebral artery.  Left vertebral artery arises directly from the aortic arch. Mild narrowing proximal left vertebral artery.  Evaluation of portions of the proximal left vertebral artery is limited by artifact.  Nonspecific parenchymal changes lung apices greater on the right. Findings can be evaluated with a formal chest CT.   MRI of the brain  12/14/2011  Moderate sized acute left hemisphere infarct which appears to be secondary to an acute left internal carotid artery occlusion. No visible hemorrhage or midline shift.  Emboli to the left middle cerebral artery and its branches is not excluded.  MRA of the brain  See CT angio  2D Echocardiogram  EF 60-65% with no source of embolus.  Left atrial mass attached to intra-atrial septum highly suggestive of myxoma.  Carotid Doppler  Right: no ICA stenosis. Left: ICA appears occluded. Bilateral  vertebral artery flow is antegrade.  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  CXR  12/26/2011 Low lung volumes with associated bibasilar volume loss. Little interval change in the  overall pulmonary vascular congestion.  12/25/2011  1. Support apparatus, as above. 2. Low lung volumes with worsening bibasilar aeration likely reflect increasing atelectasis with superimposed small bilateral pleural effusions. 3. There may be some mild interstitial pulmonary edema as well. 12/24/2011 Bibasilar atelectasis, greater on the right. Question minimal perihilar edema. Tip of endotracheal tube is 3 mm from carina directed into the right mainstem bronchus, recommend withdrawal 2 cm. Recommend clinical confirmation of the position of the left jugular catheter as above. 12/20/2011 1. Improvement in pulmonary edema. 2. Cardiac enlargement, low lung volumes and bibasilar atelectasis.  12/18/2011 Some increase in airspace disease likely due to progressive edema. 12/16/2011  Appearance of chest is similar to the recent prior study, again suggesting a background of mild interstitial pulmonary edema. Overall, aeration has slightly improved, with decreasing small bilateral pleural effusions. 12/14/2011   Cardiomegaly and bilateral airspace disease most consistent with pulmonary edema.     CT chest 12/17/2011 Respiratory motion degraded examination with poor inspiration. Scattered pulmonary parenchymal changes some which appears to represent scarring/interstitial fibrosis. Right upper lobe focal bronchiectasis. Scattered regions of mild consolidation may represent scarring although difficult to exclude superimposed infection  CT Abdomen and Pelvis 12/17/2011 On this unenhanced examination, no definitive pancreatic masses identified however, evaluation without contrast is markedly limited. For detection of a neuroendocrine tumor, preferable examination is contrast enhanced MR. If the patient cannot hold her breath, CT with contrast with pancreatic protocol can be obtained. Very small amount of free fluid. Etiology indeterminate.  Cardiac Cath 12/18/2011  1. Normal coronary arteries. 2. Normal abdominal aorta, iliac  and femoral arteries. 3. Left atrial myxoma identified by neovascularization during right coronary angiography. 4. Left ventriculography was not performed.  MRI Abdomen 12/22/2011 1. Diminished exam detail due to respiratory motion artifact. 2. No pancreatic mass identified. 3. Indeterminant T2 intense structure within the right hepatic lobe. Not imaged on the T1-weighted images were the postcontrast images. Because of the patient's difficulty with breath-hold I would advise a three-phase liver imaging with contrast enhanced CT of the abdomen for more definitive assessment.  EKG  normal sinus rhythm.   Physical Exam   Pleasant middle-age [place] lady currently not in distress.Awake alert. Afebrile. Head is nontraumatic. Neck is supple without bruit. Hearing is normal. Cardiac exam no murmur or gallop. Lungs are clear to auscultation. Distal pulses are well felt.  Neurological Exam ; awake alert mild expressive aphasia with word finding difficulties and paraphasias.verbal perseveration. Good comprehension  and repitition but poor naming.. Able to follow commands well. Extraocular movements are full range without nystagmus. No visual field defect to bedside confrontational testing. Mild right lower facial weakness. Tongue is midline. Motor system exam reveals mild right hemiparesis with RUE 3/5 weaknessand moderate right grip and intrinsic hand muscles. Good right lower extremity strength without drift. The minimum right hip flexion and ankle dorsiflexion weakness. Sensation slightly diminished on the right compared to the left. Coordination is slow but accurate. Gait was not tested.  ASSESSMENT Courtney Faulkner is a 61 y.o. 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 Status post successful myxoma excision by Dr. Cornelius Moras 12/24/2011. Now in SICU post op. Neuro status stable. On no  antiplatelets prior to admission. Now on aspirin  325 mg orally every day for secondary stroke prevention. Patient with resultant right hemiparesis and expressive aphasia with overall improvement, though slight worsening overnight. Pt with perseveration. Therapies have been ordered for ambulation. 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 POD1 -left ICA occlusion suspect secondary to myxoma emboli vs dissection -tachycardia -hypoglycemia, recurrent on admission -elevated cardiac enzymes on admission, cardiac related.  -short PR -obesity, class 2, Body mass index is 37.53 kg/(m^2). -hypotension -leukocytosis  Hospital day # 12  TREATMENT/PLAN -Rehab consult in place  [place], AVNP, ANP-BC, GNP-BC [facility] Pager: [phone] 12/26/2011 8:26 AM  Dr. Delia Heady, Stroke Center Medical Director, has personally reviewed chart, pertinent data, examined the patient and developed the plan of care. Pager:  [phone]  ,

## 2011-12-26 NOTE — Progress Notes (Addendum)
I met with patient at bedside. She states her Mom can assist in her care when her spouse works. Patient would benefit from an inpt rehab stay prior to d/c home. I will contact her spouse and discuss our rehab venue. I will begin precert with insurance when pt close to medically ready. Please call [phone] with questions. I have left a message for spouse to call me to discuss rehab venues.

## 2011-12-27 ENCOUNTER — Inpatient Hospital Stay (HOSPITAL_COMMUNITY): Payer: BC Managed Care – PPO

## 2011-12-27 DIAGNOSIS — I6330 Cerebral infarction due to thrombosis of unspecified cerebral artery: Secondary | ICD-10-CM

## 2011-12-27 LAB — GLUCOSE, CAPILLARY
Glucose-Capillary: 46 mg/dL — ABNORMAL LOW (ref 70–99)
Glucose-Capillary: 131 mg/dL — ABNORMAL HIGH (ref 70–99)
Glucose-Capillary: 100 mg/dL — ABNORMAL HIGH (ref 70–99)
Glucose-Capillary: 76 mg/dL (ref 70–99)

## 2011-12-27 LAB — PREPARE RBC (CROSSMATCH)

## 2011-12-27 LAB — CBC
HCT: 24.4 % — ABNORMAL LOW (ref 36.0–46.0)
MCHC: 31.6 g/dL (ref 30.0–36.0)
MCV: 94.6 fL (ref 78.0–100.0)
Platelets: 236 10*3/uL (ref 150–400)
RDW: 15.4 % (ref 11.5–15.5)
WBC: 11.1 10*3/uL — ABNORMAL HIGH (ref 4.0–10.5)

## 2011-12-27 LAB — BASIC METABOLIC PANEL
BUN: 16 mg/dL (ref 6–23)
Chloride: 104 mEq/L (ref 96–112)
Creatinine, Ser: 0.59 mg/dL (ref 0.50–1.10)
GFR calc Af Amer: >90 mL/min (ref >90)
GFR calc non Af Amer: >90 mL/min (ref >90)
Potassium: 3.2 mEq/L — ABNORMAL LOW (ref 3.5–5.1)

## 2011-12-27 MED ORDER — POTASSIUM CHLORIDE 10 MEQ/50ML IV SOLN
INTRAVENOUS | Status: AC
  Administered 2011-12-27: 10 meq via INTRAVENOUS
  Filled 2011-12-27: qty 50

## 2011-12-27 MED ORDER — AMIODARONE HCL IN DEXTROSE 360-4.14 MG/200ML-% IV SOLN
INTRAVENOUS | Status: AC
  Filled 2011-12-27: qty 200

## 2011-12-27 MED ORDER — POTASSIUM CHLORIDE 10 MEQ/50ML IV SOLN
10.0000 meq | INTRAVENOUS | Status: AC
  Administered 2011-12-27 (×2): 10 meq via INTRAVENOUS

## 2011-12-27 MED ORDER — FUROSEMIDE 10 MG/ML IJ SOLN
40.0000 mg | Freq: Once | INTRAMUSCULAR | Status: AC
  Administered 2011-12-27: 40 mg via INTRAVENOUS
  Filled 2011-12-27: qty 4

## 2011-12-27 MED ORDER — POTASSIUM CHLORIDE 10 MEQ/50ML IV SOLN
10.0000 meq | INTRAVENOUS | Status: AC
  Administered 2011-12-27 (×3): 10 meq via INTRAVENOUS
  Filled 2011-12-27: qty 200

## 2011-12-27 NOTE — Progress Notes (Signed)
Stroke Team Progress Note  HISTORY Courtney Faulkner is an 61 y.o. female with dysarthria, aphasia and right sided weakness. According to son at bedside, his father heard her vaccuming at ~08:30 on 12/14/2011. He came down stairs at 11:30 and found her confused and dysarthric. EMS called and found BS 36. Patient was subsequently brought to the ED. She arrive at 1214. In the ED patient was given some orange juice and her blood CBG went to 166. Patient subsequently had a head CT done which was negative. Initially a code stroke was called however it was canceled secondary to patient's hypoglycemic spells. Per family patient was recently been treated for upper respiratory symptoms with prednisone and albuterol. Patient and family deny any recent fevers, no chills, no chest pain, no diarrhea, no constipation, no dysuria, no generalized weakness. Patient's family does endorse some shortness of breath on exertion when patient climbs up stairs which has been ongoing for the past 2 weeks. On exam and during the interview patient has some dysarthric speech is alert to self only and has an incoherent speech as well. After treatment, CBG 166, pt remains confused, and having difficulty following commands.  Neuro consult called by Dr. Janee Morn for this reason. Patient was not a TPA candidate secondary to presenting beyond the tPA window. She was transferred to the ICU after admission for hypoglycemia and neuro decline for further evaluation and treatment.  SUBJECTIVE   OBJECTIVE Most recent Vital Signs: Filed Vitals:   12/27/11 0500 12/27/11 0600 12/27/11 0700 12/27/11 0723  BP: 101/54 106/54 99/50   Pulse: 97 93 70   Temp:    99.4 F (37.4 C)  TempSrc:    Oral  Resp: 29 30 27    Height:      Weight:  96.2 kg (212 lb 1.3 oz)    SpO2: 100% 94% 90%    CBG (last 3)   Basename 12/27/11 0721 12/26/11 2208 12/26/11 1550  GLUCAP 65* 94 79   Intake/Output from previous day: 05/02 0701 - 05/03 0700 In: 626.7  [I.V.:426.7; IV Piggyback:200] Out: 795 [Urine:685; Chest Tube:110]  IV Fluid Intake:     . sodium chloride 20 mL/hr at 12/26/11 1200  . sodium chloride    . phenylephrine (NEO-SYNEPHRINE) Adult infusion 15 mcg/min (12/25/11 0700)   MEDICATIONS    . acetaminophen  1,000 mg Oral Q6H   Or  . acetaminophen (TYLENOL) oral liquid 160 mg/5 mL  975 mg Per Tube Q6H  . amiodarone  200 mg Oral BID PC  . aspirin EC  325 mg Oral Daily   Or  . aspirin  324 mg Per Tube Daily  . bisacodyl  10 mg Oral Daily   Or  . bisacodyl  10 mg Rectal Daily  . cefUROXime (ZINACEF)  IV  1.5 g Intravenous Q12H  . docusate sodium  200 mg Oral Daily  . enoxaparin (LOVENOX) injection  40 mg Subcutaneous Q24H  . fluconazole  200 mg Oral Daily  . pantoprazole  40 mg Oral Q1200  . potassium chloride  10 mEq Intravenous Q1 Hr x 3  . potassium chloride  10 mEq Intravenous Q1 Hr x 3  . potassium chloride      . sodium chloride  10-40 mL Intracatheter Q12H  . sodium chloride  3 mL Intravenous Q12H  . DISCONTD: insulin aspart  0-24 Units Subcutaneous TID WC & HS  . DISCONTD: metoprolol tartrate  25 mg Oral BID   PRN:  metoprolol, morphine, ondansetron (ZOFRAN) IV, oxyCODONE,  sodium chloride, sodium chloride  Diet:  General Activity:   ambulate DVT Prophylaxis:  Lovenox 40 mg sq daily   CLINICALLY SIGNIFICANT STUDIES CBC    Component Value Date/Time   WBC 11.1* 12/27/2011 0530   RBC 2.58* 12/27/2011 0530   HGB 7.7* 12/27/2011 0530   HCT 24.4* 12/27/2011 0530   PLT 236 12/27/2011 0530   MCV 94.6 12/27/2011 0530   MCH 29.8 12/27/2011 0530   MCHC 31.6 12/27/2011 0530   RDW 15.4 12/27/2011 0530   LYMPHSABS 1.9 12/18/2011 0303   MONOABS 0.9 12/18/2011 0303   EOSABS 0.3 12/18/2011 0303   BASOSABS 0.1 12/18/2011 0303   CMP    Component Value Date/Time   NA 138 12/27/2011 0530   K 3.2* 12/27/2011 0530   CL 104 12/27/2011 0530   CO2 28 12/27/2011 0530   GLUCOSE 100* 12/27/2011 0530   BUN 16 12/27/2011 0530   CREATININE 0.59 12/27/2011  0530   CALCIUM 8.3* 12/27/2011 0530   PROT 6.5 12/20/2011 1818   ALBUMIN 2.4* 12/20/2011 1818   AST 32 12/20/2011 1818   ALT 30 12/20/2011 1818   ALKPHOS 54 12/20/2011 1818   BILITOT 0.2* 12/20/2011 1818   GFRNONAA >90 12/27/2011 0530   GFRAA >90 12/27/2011 0530   COAGS Lab Results  Component Value Date   INR 1.43 12/24/2011   INR 1.05 12/20/2011   INR 1.04 12/17/2011   Lipid Panel    Component Value Date/Time   CHOL 134 12/16/2011 0311   TRIG 90 12/16/2011 0311   HDL 45 12/16/2011 0311   CHOLHDL 3.0 12/16/2011 0311   VLDL 18 12/16/2011 0311   LDLCALC 71 12/16/2011 0311   HgbA1C  Lab Results  Component Value Date   HGBA1C 5.1 12/20/2011   Cardiac Panel (last 3 results)  No results found for this basename: CKTOTAL:3,CKMB:3,TROPONINI:3,RELINDX:3 in the last 72 hours Urinalysis    Component Value Date/Time   COLORURINE YELLOW 12/23/2011 1320   APPEARANCEUR CLOUDY* 12/23/2011 1320   LABSPEC 1.029 12/23/2011 1320   PHURINE 6.0 12/23/2011 1320   GLUCOSEU NEGATIVE 12/23/2011 1320   HGBUR NEGATIVE 12/23/2011 1320   BILIRUBINUR NEGATIVE 12/23/2011 1320   KETONESUR NEGATIVE 12/23/2011 1320   PROTEINUR NEGATIVE 12/23/2011 1320   UROBILINOGEN 0.2 12/23/2011 1320   NITRITE NEGATIVE 12/23/2011 1320   LEUKOCYTESUR MODERATE* 12/23/2011 1320   Urine Drug Screen  No results found for this basename: labopia,  cocainscrnur,  labbenz,  amphetmu,  thcu,  labbarb    Alcohol Level No results found for this basename: eth   VITAMIN B12     Status: Normal   Collection Time   12/15/11  9:00 AM      Component Value Range   Vitamin B-12 287  211 - 911 (pg/mL)  IRON AND TIBC     Status: Abnormal   Collection Time   12/15/11  9:00 AM      Component Value Range   Iron 39 (*) 42 - 135 (ug/dL)   TIBC 981 (*) 191 - 478 (ug/dL)   Saturation Ratios 17 (*) 20 - 55 (%)   UIBC 192  125 - 400 (ug/dL)  FERRITIN     Status: Normal   Collection Time   12/15/11  9:00 AM      Component Value Range   Ferritin 200  10 - 291  (ng/mL)  RETICULOCYTES     Status: Normal   Collection Time   12/15/11  9:00 AM      Component Value Range  Retic Ct Pct 2.9  0.4 - 3.1 (%)   RBC. 3.97  3.87 - 5.11 (MIL/uL)   Retic Count, Manual 115.1  19.0 - 186.0 (K/uL)  PRO B NATRIURETIC PEPTIDE     Status: Abnormal   Collection Time   12/15/11  9:00 AM      Component Value Range   Pro B Natriuretic peptide (BNP) 441.6 (*) 0 - 125 (pg/mL)   CT of the brain   12/15/2011  Developing cytotoxic edema correlates with the observed pattern of restricted diffusion on MR, representing acute infarction. This primarily involves the left basal ganglia.  Smaller areas of cortical and subcortical infarction seen on MRI in the left hemisphere are too small to be easily visible on CT.   No incipient uncal or transtentorial herniation, although minimal 1 mm left to right shift may be present.  No hemorrhagic transformation.    12/14/2011  Normal CT of the head without contrast.   CT Angio Head 12/16/2011  Acute non hemorrhagic infarct left basal ganglia, left uncus and left corona radiata.  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  Collateral flow to the left anterior cerebral artery and left middle cerebral artery branches.  The flow within the left middle cerebral artery branches is diminished suggesting that collateral flow is not adequate.   CT Angio Neck 12/16/2011  Abnormal appearance of the left internal carotid artery with appearance of dissection with complete occlusion at the C2 level.  Abnormal appearance of the proximal left common carotid artery. Question if this is related to artifact versus dissection versus atherosclerotic type changes.  Mild narrowing proximal right common carotid artery.  Prominent ectasia of the proximal right common carotid artery with fold and mild narrowing.  Mild irregularity at the right carotid bifurcation without measurable stenosis.  Mild to moderate narrowing proximal right vertebral artery.  Mild irregularity  throughout the course of the right vertebral artery.  Left vertebral artery arises directly from the aortic arch. Mild narrowing proximal left vertebral artery.  Evaluation of portions of the proximal left vertebral artery is limited by artifact.  Nonspecific parenchymal changes lung apices greater on the right. Findings can be evaluated with a formal chest CT.   MRI of the brain  12/14/2011  Moderate sized acute left hemisphere infarct which appears to be secondary to an acute left internal carotid artery occlusion. No visible hemorrhage or midline shift.  Emboli to the left middle cerebral artery and its branches is not excluded.  MRA of the brain  See CT angio  2D Echocardiogram  EF 60-65% with no source of embolus.  Left atrial mass attached to intra-atrial septum highly suggestive of myxoma.  Carotid Doppler  Right: no ICA stenosis. Left: ICA appears occluded. Bilateral vertebral artery flow is antegrade.  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  CXR  12/26/2011 Low lung volumes with associated bibasilar volume loss. Little interval change in the overall pulmonary vascular congestion.  12/25/2011  1. Support apparatus, as above. 2. Low lung volumes with worsening bibasilar aeration likely reflect increasing atelectasis with superimposed small bilateral pleural effusions. 3. There may be some mild interstitial pulmonary edema as well. 12/24/2011 Bibasilar atelectasis, greater on the right. Question minimal perihilar edema. Tip of endotracheal tube is 3 mm from carina directed into the right mainstem bronchus, recommend withdrawal 2 cm. Recommend clinical confirmation of the position of the left jugular catheter as above. 12/20/2011 1. Improvement  in pulmonary edema. 2. Cardiac enlargement, low lung volumes and bibasilar atelectasis.  12/18/2011 Some increase in airspace disease likely due to progressive edema. 12/16/2011  Appearance of chest  is similar to the recent prior study, again suggesting a background of mild interstitial pulmonary edema. Overall, aeration has slightly improved, with decreasing small bilateral pleural effusions. 12/14/2011   Cardiomegaly and bilateral airspace disease most consistent with pulmonary edema.     CT chest 12/17/2011 Respiratory motion degraded examination with poor inspiration. Scattered pulmonary parenchymal changes some which appears to represent scarring/interstitial fibrosis. Right upper lobe focal bronchiectasis. Scattered regions of mild consolidation may represent scarring although difficult to exclude superimposed infection  CT Abdomen and Pelvis 12/17/2011 On this unenhanced examination, no definitive pancreatic masses identified however, evaluation without contrast is markedly limited. For detection of a neuroendocrine tumor, preferable examination is contrast enhanced MR. If the patient cannot hold her breath, CT with contrast with pancreatic protocol can be obtained. Very small amount of free fluid. Etiology indeterminate.  Cardiac Cath 12/18/2011  1. Normal coronary arteries. 2. Normal abdominal aorta, iliac and femoral arteries. 3. Left atrial myxoma identified by neovascularization during right coronary angiography. 4. Left ventriculography was not performed.  MRI Abdomen 12/22/2011 1. Diminished exam detail due to respiratory motion artifact. 2. No pancreatic mass identified. 3. Indeterminant T2 intense structure within the right hepatic lobe. Not imaged on the T1-weighted images were the postcontrast images. Because of the patient's difficulty with breath-hold I would advise a three-phase liver imaging with contrast enhanced CT of the abdomen for more definitive assessment.  EKG  normal sinus rhythm.   Physical Exam   Pleasant middle-age [place] American lady currently not in distress.Awake alert. Afebrile. Head is nontraumatic. Neck is supple without bruit. Hearing is normal. Cardiac exam no  murmur or gallop. Lungs are clear to auscultation. Distal pulses are well felt.  Neurological Exam ; awake alert mild expressive aphasia with word finding difficulties and paraphasias.verbal perseveration. Good comprehension  and repitition but poor naming.. Able to follow commands well. Extraocular movements are full range without nystagmus. No visual field defect to bedside confrontational testing. Mild right lower facial weakness. Tongue is midline. Motor system exam reveals mild right hemiparesis with RUE 3/5 weaknessand moderate right grip and intrinsic hand muscles. Good right lower extremity strength without drift. The minimum right hip flexion and ankle dorsiflexion weakness. Sensation slightly diminished on the right compared to the left. Coordination is slow but accurate. Gait was not tested.  ASSESSMENT Courtney Faulkner is a 61 y.o. 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 Status post successful myxoma excision by Dr. Cornelius Moras 12/24/2011. Now in SICU post op. Neuro status stable. On no antiplatelets prior to admission. Now on aspirin 325 mg orally every day for secondary stroke prevention. Patient with resultant right hemiparesis and expressive aphasia with overall improvement,   Therapies have been ordered for ambulation. 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 Still with 1 chest tube in place -high risk for post op arrhythmias. Plan amiodarone now and at discharge -left ICA occlusion suspect secondary to myxoma emboli vs dissection-unclear as dental artefacts interfere with images.will check repeat CT angio in 2-3 weeks  With neck extension to avoid dental artefacts as incase of myxomatous occlusion may need tumor removal by vascular surgery -tachycardia -hypoglycemia, recurrent on admission. Per IM - elevated C-peptide, elevated proinsulin, normal insulin levels. Differential  includes insulinoma vs insulin autoimmune  hypoglycemia vs infectious etiology (with myxoma as possible source). CT and MRI abd showed no lesions. GI recommend EUS for insulinoma, per CVTS not stable for procedure for several months post-op. monitoring CBGs -elevated cardiac enzymes on admission, cardiac related.  -short PR -obesity, class 2, Body mass index is 37.57 kg/(m^2). -hypotension -leukocytosis  Hospital day # 13  TREATMENT/PLAN -IP Rehab planned at discharge -as late in the hospital stay as possible, we need to do a CTA with chin up to remove dental artifact in order to determine ICA dissection vs tumor. Please contact Dr. Pearlean Brownie once patient is approaching discharge from rehab. Dr. Pearlean Brownie has discussed case with Dr. Constance Goltz. When ordering CTA, add text:  "with chin up" and "if question, call Dr. Constance Goltz"  Annie Main, AVNP, ANP-BC, GNP-BC [facility] Pager: [phone] 12/27/2011 8:25 AM  Dr. Delia Heady, Stroke Center Medical Director, has personally reviewed chart, pertinent data, examined the patient and developed the plan of care. Pager:  [phone]  ,

## 2011-12-27 NOTE — Progress Notes (Signed)
Physical Therapy Treatment Patient Details Name: Courtney Faulkner MRN: [id number] DOB: May 06, 1951 Today's Date: 12/27/2011 Time: 4098-1191 PT Time Calculation (min): 32 min  PT Assessment / Plan / Recommendation Comments on Treatment Session  Pt s/p LCVA and minimally invasive myxoma removal from atrium. No orthostasis with activity today and improved activity tolerance with ability to perform gait today. Pt with improved speech and awareness today as well. Pt encouraged to continue HEP and mobility over the weekend with nursing. No knee buckling noted today.    Follow Up Recommendations  Inpatient Rehab    Equipment Recommendations       Frequency     Plan Discharge plan remains appropriate    Precautions / Restrictions Precautions Precautions: Fall Precaution Comments: limited RUE movement secondary to myxoma removal, hemiparesis Rside   Pertinent Vitals/Pain 100/61 (70) standing 107-123 HR in standing with ambulation 3L O2 during activity    Mobility  Transfers Transfers: Sit to Stand;Stand to Sit Sit to Stand: 1: +2 Total assist;From chair/3-in-1 Sit to Stand: Patient Percentage: 60% Stand to Sit: To chair/3-in-1;3: Mod assist Details for Transfer Assistance: cueing for use of LUE on armrest with each transfer but pt not following commands to reach back with LUE. 3 trials of sit to stand from chair.  Ambulation/Gait Ambulation/Gait Assistance: 1: +2 Total assist Ambulation/Gait: Patient Percentage: 80 Ambulation Distance (Feet): 15 Feet (10' and 15' over two trials with seated rest between) Assistive device: Rolling walker Ambulation/Gait Assistance Details: cueing to bring bil LE into RW, achieve weight shift and maintain grasp on RW with RUE. Assist to direct RW and chair pulled behind pt due to limited activity before fatigues. Approximately 8 min seated rest between ambulation trials where pt brushed teeth. Cueing with gait for upright posture Gait Pattern: Step-to  pattern;Decreased stride length Stairs: No    Exercises     PT Goals Acute Rehab PT Goals PT Goal: Sit to Stand - Progress: Progressing toward goal PT Goal: Stand to Sit - Progress: Progressing toward goal PT Transfer Goal: Bed to Chair/Chair to Bed - Progress: Progressing toward goal PT Goal: Ambulate - Progress: Progressing toward goal  Visit Information  Last PT Received On: 12/27/11 Assistance Needed: +2 PT/OT Co-Evaluation/Treatment: Yes    Subjective Data  Subjective: in [place]   Cognition  Overall Cognitive Status: Difficult to assess Area of Impairment: Memory Arousal/Alertness: Awake/alert Behavior During Session: [facility] for tasks performed Current Attention Level: Selective Memory Deficits: Pt able to state some sort of surgery today but inaccurate. Pt unable to state hospital but accurately responding given 3 choices and stated city independently Cognition - Other Comments: difficult to assess cognition secondary to aphasia but much more accurate in answers and automatic responses today. All conversational responses appropriate today.     Balance  Static Standing Balance Static Standing - Balance Support: Bilateral upper extremity supported Static Standing - Level of Assistance: 5: Stand by assistance Static Standing - Comment/# of Minutes: pt able to stand for 80 sec today with cueing for eyes open and to extend hips and trunk  End of Session PT - End of Session Equipment Utilized During Treatment: Gait belt Activity Tolerance: Patient tolerated treatment well Patient left: in chair;with call bell/phone within reach Nurse Communication: Mobility status    Delorse Lek 12/27/2011, 12:21 PM Delaney Meigs, PT [phone]

## 2011-12-27 NOTE — Progress Notes (Addendum)
Speech Language Pathology Treatment Patient Details Name: JANAI MAUDLIN MRN: [id number] DOB: [birth year] Today's Date: 12/27/2011 Time: 7829-5621 SLP Time Calculation (min): 35 min  Assessment / Plan / Recommendation Clinical Impression  Treatment today focused on expressive language primarily.  Tasks included naming items from descriptions to access language center- pt named 5/8 without cue, required phonemic cue for other 3 items.  Attempts at writing using left hand proved to be frustrating , but pt states she will practice using left hand.  Functional communication is enhanced by giving pt multiple choices when word finding deficits are apparent (with context known).  In addition, SLP spelling word to pt was effective in one instance.  Pt reports expression is difficult today but improved over yesterday.  Perservation x 5 noted during therapy with a single neologism.    Observed pt ordering food with ambassador - when provided choice of 2-3, pt chose 75% of opportunities - 25% of the time, she answered yes to single item named or said "the first one".  SLP encouraged pt to assure she is ordering food items that she desires.  SLP educated pt (and provided in writing)  strategies to improve functional word finding.  Pt answered questions today in single words and/or short phrases.  Emergent awareness of language errors mildly improved, however decr attention, impulsvity and possibly frustration results in pt decreasing communication attempts.  Pt states she is able to communicate needs to nurse and declined offer of communication board.  Rec Skilled ST continue to maximize functional communication in current environment.      SLP Plan  Continue with current plan of care       SLP Goals  SLP Goals Potential to Achieve Goals: Good Potential Considerations: Co-morbidities Progress/Goals/Alternative treatment plan discussed with pt/caregiver and they: Agree SLP Goal #1 - Progress: Progressing  toward goal SLP Goal #2 - Progress: Progressing toward goal  General Temperature Spikes Noted: No Behavior/Cognition: Alert;Cooperative Patient Positioning: Upright in chair  Oral Cavity - Oral Hygiene Does patient have any of the following "at risk" factors?: Oxygen therapy - cannula, mask, simple oxygen devices Brush patient's teeth BID with toothbrush (using toothpaste with fluoride): Yes   Treatment Treatment focused on: Aphasia   Donavan Burnet, MS [facility] SLP [phone]

## 2011-12-27 NOTE — Progress Notes (Signed)
Patient ID: Courtney Faulkner, female   DOB: 12/19/1950, 61 y.o.   MRN: [id number]                   [address]            [place] [address]          [phone]     3 Days Post-Op Procedure(s) (LRB): MINIMALLY INVASIVE EXCISION OF ATRIAL MYXOMA (N/A)  Total Length of Stay:  LOS: 13 days  BP 94/34  Pulse 92  Temp(Src) 99.5 F (37.5 C) (Oral)  Resp 33  Ht 5\' 3"  (1.6 m)  Wt 212 lb 1.3 oz (96.2 kg)  BMI 37.57 kg/m2  SpO2 99%     . sodium chloride 20 mL/hr at 12/26/11 1200  . sodium chloride    . phenylephrine (NEO-SYNEPHRINE) Adult infusion 15 mcg/min (12/25/11 0700)     Lab Results  Component Value Date   WBC 11.1* 12/27/2011   HGB 7.7* 12/27/2011   HCT 24.4* 12/27/2011   PLT 236 12/27/2011   GLUCOSE 100* 12/27/2011   CHOL 134 12/16/2011   TRIG 90 12/16/2011   HDL 45 12/16/2011   LDLCALC 71 12/16/2011   ALT 30 12/20/2011   AST 32 12/20/2011   NA 138 12/27/2011   K 3.2* 12/27/2011   CL 104 12/27/2011   CREATININE 0.59 12/27/2011   BUN 16 12/27/2011   CO2 28 12/27/2011   TSH 3.588 12/16/2011   INR 1.43 12/24/2011   HGBA1C 5.1 12/20/2011   In patient rehab, when ready and tubes are out  Delight Ovens MD  Beeper [phone] Office [phone] 12/27/2011 7:57 PM

## 2011-12-27 NOTE — Progress Notes (Signed)
3 Days Post-Op Procedure(s) (LRB): MINIMALLY INVASIVE EXCISION OF ATRIAL MYXOMA (N/A) Subjective: Feels pretty well this AM Denies pain, nausea  Objective: Vital signs in last 24 hours: Temp:  [98.3 F (36.8 C)-99.4 F (37.4 C)] 99.4 F (37.4 C) (05/03 0723) Pulse Rate:  [51-97] 70  (05/03 0700) Cardiac Rhythm:  [-] Normal sinus rhythm (05/03 0400) Resp:  [23-34] 27  (05/03 0700) BP: (81-108)/(30-64) 99/50 mmHg (05/03 0700) SpO2:  [90 %-100 %] 90 % (05/03 0700) Weight:  [212 lb 1.3 oz (96.2 kg)] 212 lb 1.3 oz (96.2 kg) (05/03 0600)  Hemodynamic parameters for last 24 hours:    Intake/Output from previous day: 05/02 0701 - 05/03 0700 In: 626.7 [I.V.:426.7; IV Piggyback:200] Out: 795 [Urine:685; Chest Tube:110] Intake/Output this shift:    General appearance: alert and no distress Heart: regular rate and rhythm Lungs: diminished breath sounds bibasilar Abdomen: normal findings: soft, non-tender  Lab Results:  Basename 12/27/11 0530 12/26/11 0430  WBC 11.1* 14.2*  HGB 7.7* 8.7*  HCT 24.4* 27.1*  PLT 236 239   BMET:  Basename 12/27/11 0530 12/26/11 0430  NA 138 136  K 3.2* 3.3*  CL 104 101  CO2 28 27  GLUCOSE 100* 123*  BUN 16 13  CREATININE 0.59 0.64  CALCIUM 8.3* 8.5    PT/INR:  Basename 12/24/11 1435  LABPROT 17.7*  INR 1.43   ABG    Component Value Date/Time   PHART 7.378 12/24/2011 2327   HCO3 24.0 12/24/2011 2327   TCO2 25 12/25/2011 1725   ACIDBASEDEF 1.0 12/24/2011 2327   O2SAT 99.0 12/24/2011 2327   CBG (last 3)   Basename 12/27/11 0721 12/26/11 2208 12/26/11 1550  GLUCAP 65* 94 79    Assessment/Plan: S/P Procedure(s) (LRB): MINIMALLY INVASIVE EXCISION OF ATRIAL MYXOMA (N/A) - CV- BP remains relatively low, maintaining SR RESP- continue pulmonary toilet RENAL- hypokalemia- supplement K, BP has been too low to diurese ANEMIA secondary to ABL, symptomatic with low BP- transfuse 1 unit ENDO- hypoglycemia- not receiving insulin, follow CVA-  mobility limited, continue PT / OT   LOS: 13 days    Courtney Faulkner C 12/27/2011

## 2011-12-27 NOTE — Progress Notes (Signed)
Subjective:  The patient is s/p excision of left atrial myxoma, day three.  Dr. Cornelius Moras is primary, we will continue to consult for hypoglycemia.  Patient is off dextrose gttp since 12/23/11. Her CBG is stabilized at normal range, except of one episode of CBG 65 last night. Patient feels much better. Her speech is more clear.  Objective: Vital signs in last 24 hours: Filed Vitals:   12/27/11 0400 12/27/11 0500 12/27/11 0600 12/27/11 0700  BP: 98/58 101/54 106/54 99/50  Pulse: 92 97 93 70  Temp:      TempSrc:      Resp: 27 29 30 27   Height:      Weight:   212 lb 1.3 oz (96.2 kg)   SpO2: 99% 100% 94% 90%   Weight change: 3.5 oz (0.1 kg)  Intake/Output Summary (Last 24 hours) at 12/27/11 0718 Last data filed at 12/27/11 0700  Gross per 24 hour  Intake  626.7 ml  Output    795 ml  Net -168.3 ml    Physical Exam:  General: in acute distress HEENT: pupils equal round and reactive to light, vision grossly intact, oropharynx clear and non-erythematous, right lower facial droop noted. Neck: supple, no lymphadenopathy Lungs: clear to ascultation bilaterally, normal work of respiration, no wheezes, rales, ronchi Heart: regular rate and rhythm, no murmurs, gallops, or rubs Abdomen: soft, non-tender, non-distended, normal bowel sounds Extremities: no cyanosis, clubbing, or edema Neurologic: more interactive and oriented today.  Right lower facial droop noted, otherwise CN II-VI and VIII-XII intact, strength 4/5 in right shoulder flexion and adduction, 4-5/5 in right elbow flexion/extension, otherwise 5/5 throughout.  Expressive aphasia noted, but improved.  Lab Results: Basic Metabolic Panel:  Lab 12/27/11 1610 12/26/11 0430 12/25/11 1700 12/25/11 0432  NA 138 136 -- --  K 3.2* 3.3* -- --  CL 104 101 -- --  CO2 28 27 -- --  GLUCOSE 100* 123* -- --  BUN 16 13 -- --  CREATININE 0.59 0.64 -- --  CALCIUM 8.3* 8.5 -- --  MG -- -- 2.1 2.3  PHOS -- -- -- --   Liver Function Tests:  Lab  12/20/11 1818  AST 32  ALT 30  ALKPHOS 54  BILITOT 0.2*  PROT 6.5  ALBUMIN 2.4*   CBC:  Lab 12/27/11 0530 12/26/11 0430  WBC 11.1* 14.2*  NEUTROABS -- --  HGB 7.7* 8.7*  HCT 24.4* 27.1*  MCV 94.6 94.1  PLT 236 239   CBG:  Lab 12/26/11 2208 12/26/11 1550 12/26/11 1212 12/26/11 0733 12/25/11 1941 12/25/11 1534  GLUCAP 94 79 80 81 92 103*   Hemoglobin A1C:  Lab 12/20/11 1818  HGBA1C 5.1   Coagulation:  Lab 12/24/11 1435 12/20/11 1818  LABPROT 17.7* 13.9  INR 1.43 1.05   Misc. Labs:  Parathyroid hormone: normal Proinsulin/Insulin Level: 11.4 C-Peptide: 4.17 (H)  AM Cortisol: 10.9  Sed Rate: 46  ANA: Neg  Anti-DNA: 1 (Neg)  RF: <10  C3: 118  C4: 51  CRP: 4.85  ANCA: Neg  MPO/PR-3: <1   Micro Results: 4/20 - 1/2 blood cultures with coag-neg staph 4/23 - blood cultures negative  Studies/Results: Dg Chest Portable 1 View In Am  12/26/2011  *RADIOLOGY REPORT*  Clinical Data: Postop open heart surgery  PORTABLE CHEST - 1 VIEW  Comparison: 12/25/2011  Findings: There has been interval placement of a right subclavian CVP and the tip is located in the region of the distal superior vena cava.  The introducer sheath in  the left jugular vein has been removed. A right-sided chest tube and pericardial drain are unchanged on the right.  Low lung volumes combined with an apical lordotic projection results in poor delineation of the cardiac silhouette.  The upper mediastinal contour and hilar regions appear unchanged with pulmonary vascular congestion again noted.  No definite residual pneumothorax is seen on the right. Bibasilar density likely related to atelectasis given the poor lung volumes is again seen and is largely unchanged.  No definite overt congestive failure is noted. A small left pleural effusion is suspected.  IMPRESSION: Low lung volumes with associated bibasilar volume loss.  Little interval change in the overall pulmonary vascular congestion.  Original Report  Authenticated By: Bertha Stakes, M.D.   Medications: I have reviewed the patient's current medications. Scheduled Meds:    . acetaminophen  1,000 mg Oral Q6H   Or  . acetaminophen (TYLENOL) oral liquid 160 mg/5 mL  975 mg Per Tube Q6H  . amiodarone  200 mg Oral BID PC  . aspirin EC  325 mg Oral Daily   Or  . aspirin  324 mg Per Tube Daily  . bisacodyl  10 mg Oral Daily   Or  . bisacodyl  10 mg Rectal Daily  . cefUROXime (ZINACEF)  IV  1.5 g Intravenous Q12H  . docusate sodium  200 mg Oral Daily  . enoxaparin (LOVENOX) injection  40 mg Subcutaneous Q24H  . fluconazole  200 mg Oral Daily  . pantoprazole  40 mg Oral Q1200  . potassium chloride  10 mEq Intravenous Q1 Hr x 3  . potassium chloride  10 mEq Intravenous Q1 Hr x 3  . potassium chloride      . sodium chloride  10-40 mL Intracatheter Q12H  . sodium chloride  3 mL Intravenous Q12H  . DISCONTD: insulin aspart  0-24 Units Subcutaneous TID WC & HS  . DISCONTD: metoprolol tartrate  25 mg Oral BID   Continuous Infusions:    . sodium chloride 20 mL/hr at 12/26/11 1200  . sodium chloride    . phenylephrine (NEO-SYNEPHRINE) Adult infusion 15 mcg/min (12/25/11 0700)   PRN Meds:.metoprolol, morphine, ondansetron (ZOFRAN) IV, oxyCODONE, sodium chloride, sodium chloride  Assessment/Plan: The patient is a 61 yo woman, no prior significant medical history, presenting with weakness and dysphagia, found to have an acute CVA, likely secondary to atrial myxoma. S/p excision of left atrial myxoma, day three. Pathology showed benign atrial myxoma.  # Left atrial myxoma - POD #3 s/p resection. -CVTS primary  # Recurrent hypoglycemia - Patient presented with hypoglycemia, which persisted during her first few days of hospitalization.  D5-NS drip discontinued 4/29.  Patient's cbg's currently well-controlled.  Elevated C-peptide, elevated proinsulin, normal insulin levels.  Differential includes insulinoma vs insulin autoimmune  hypoglycemia vs infectious etiology (with myxoma as possible source).  CT and MRI abd showed no lesions (though MRI of poor quality due to respiratory motion).  GI consulted, considered EUS for insulinoma, but per CVTS not stable for procedure for several months post-op.  -pending insulin receptor antibodies -continue to monitor cbg's, now that patient is off of D5-NS and steroids. CBG has been stabilized at normal range since 12/21/21.  # Acute CVA - likely represents thrombus from atrial myxoma, also considering L ICA dissection (given CT neck results). - Neurology is on board -continue aspirin 81 daily - heparin drip is off on 4/29 -continue PT, OT,  -rehab consult  # Abnormal blood culture result - 1/2 blood cultures  from 4/20 showing coag-neg staph, initially concerning for bacteremia, treated with vanc/ceftriaxone.  However, blood cultures 4/23 negative, so staph likely contaminant. -vanc/ceftriaxone discontinued 4/29  # Prophy - SCD's   LOS: 13 days   Lorretta Harp 12/27/2011, 7:18 AM

## 2011-12-28 ENCOUNTER — Inpatient Hospital Stay (HOSPITAL_COMMUNITY): Payer: BC Managed Care – PPO

## 2011-12-28 LAB — GLUCOSE, CAPILLARY
Glucose-Capillary: 118 mg/dL — ABNORMAL HIGH (ref 70–99)
Glucose-Capillary: 77 mg/dL (ref 70–99)

## 2011-12-28 LAB — BASIC METABOLIC PANEL
Calcium: 8.6 mg/dL (ref 8.4–10.5)
GFR calc Af Amer: >90 mL/min (ref >90)
GFR calc non Af Amer: >90 mL/min (ref >90)
Glucose, Bld: 90 mg/dL (ref 70–99)
Potassium: 3.4 mEq/L — ABNORMAL LOW (ref 3.5–5.1)
Sodium: 135 mEq/L (ref 135–145)

## 2011-12-28 LAB — TYPE AND SCREEN: Unit division: 0

## 2011-12-28 LAB — CBC
MCH: 30.1 pg (ref 26.0–34.0)
MCHC: 32.3 g/dL (ref 30.0–36.0)
Platelets: 226 10*3/uL (ref 150–400)
RDW: 15.8 % — ABNORMAL HIGH (ref 11.5–15.5)

## 2011-12-28 LAB — INSULIN ANTIBODIES, BLOOD: Insulin Antibodies, Human: <0.4 U/mL (ref <0.4)

## 2011-12-28 MED ORDER — POTASSIUM CHLORIDE 10 MEQ/50ML IV SOLN
INTRAVENOUS | Status: AC
  Administered 2011-12-28: 10 meq
  Filled 2011-12-28: qty 50

## 2011-12-28 MED ORDER — FUROSEMIDE 10 MG/ML IJ SOLN
40.0000 mg | Freq: Once | INTRAMUSCULAR | Status: AC
  Administered 2011-12-28: 40 mg via INTRAVENOUS
  Filled 2011-12-28 (×2): qty 4

## 2011-12-28 MED ORDER — AMIODARONE HCL IN DEXTROSE 360-4.14 MG/200ML-% IV SOLN
INTRAVENOUS | Status: AC
  Administered 2011-12-28: 150 mg
  Filled 2011-12-28: qty 200

## 2011-12-28 MED ORDER — POTASSIUM CHLORIDE 10 MEQ/50ML IV SOLN
10.0000 meq | INTRAVENOUS | Status: AC | PRN
  Administered 2011-12-28 – 2011-12-29 (×4): 10 meq via INTRAVENOUS
  Filled 2011-12-28 (×2): qty 50

## 2011-12-28 MED ORDER — AMIODARONE HCL 150 MG/3ML IV SOLN
150.0000 mg | Freq: Once | INTRAVENOUS | Status: AC
  Administered 2011-12-28: 150 mg via INTRAVENOUS
  Filled 2011-12-28 (×2): qty 3

## 2011-12-28 NOTE — Progress Notes (Signed)
Patient ID: Courtney Faulkner, female   DOB: August 20, 1951, 61 y.o.   MRN: [id number] TCTS DAILY PROGRESS NOTE                   [address]            [place] [id number]          [phone]      4 Days Post-Op Procedure(s) (LRB): MINIMALLY INVASIVE EXCISION OF ATRIAL MYXOMA (N/A)  Total Length of Stay:  LOS: 14 days   Subjective: Up to chair but needs help   Objective: Vital signs in last 24 hours: Temp:  [98.2 F (36.8 C)-99.5 F (37.5 C)] 98.2 F (36.8 C) (05/04 0400) Pulse Rate:  [36-149] 80  (05/04 0600) Cardiac Rhythm:  [-] Normal sinus rhythm (05/03 2200) Resp:  [22-36] 28  (05/04 0600) BP: (79-117)/(31-81) 99/55 mmHg (05/04 0600) SpO2:  [63 %-100 %] 100 % (05/04 0600) Weight:  [210 lb 8.6 oz (95.5 kg)] 210 lb 8.6 oz (95.5 kg) (05/04 0500)  Filed Weights   12/26/11 0600 12/27/11 0600 12/28/11 0500  Weight: 211 lb 13.8 oz (96.1 kg) 212 lb 1.3 oz (96.2 kg) 210 lb 8.6 oz (95.5 kg)    Weight change: -1 lb 8.7 oz (-0.7 kg)   Hemodynamic parameters for last 24 hours:    Intake/Output from previous day: 05/03 0701 - 05/04 0700 In: 1068.2 [P.O.:160; I.V.:365.7; Blood:342.5; IV Piggyback:200] Out: 2465 [Urine:2195; Chest Tube:270]  Intake/Output this shift:    Current Meds: Scheduled Meds:   . acetaminophen  1,000 mg Oral Q6H   Or  . acetaminophen (TYLENOL) oral liquid 160 mg/5 mL  975 mg Per Tube Q6H  . amiodarone (NEXTERONE PREMIX) 360 mg/200 mL dextrose      . amiodarone  200 mg Oral BID PC  . aspirin EC  325 mg Oral Daily   Or  . aspirin  324 mg Per Tube Daily  . bisacodyl  10 mg Oral Daily   Or  . bisacodyl  10 mg Rectal Daily  . docusate sodium  200 mg Oral Daily  . enoxaparin (LOVENOX) injection  40 mg Subcutaneous Q24H  . fluconazole  200 mg Oral Daily  . furosemide  40 mg Intravenous Once  . pantoprazole  40 mg Oral Q1200  . potassium chloride  10 mEq Intravenous Q1 Hr x 2  . potassium chloride      . sodium chloride  10-40 mL  Intracatheter Q12H  . sodium chloride  3 mL Intravenous Q12H   Continuous Infusions:   . sodium chloride 20 mL/hr at 12/26/11 1200  . sodium chloride Stopped (12/28/11 0517)  . phenylephrine (NEO-SYNEPHRINE) Adult infusion 15 mcg/min (12/25/11 0700)   PRN Meds:.metoprolol, morphine, ondansetron (ZOFRAN) IV, oxyCODONE, potassium chloride, sodium chloride, sodium chloride  General appearance: alert and cooperative unchanged, neuro status stable from preop  Lab Results: CBC: Basename 12/28/11 0340 12/27/11 0530  WBC 9.8 11.1*  HGB 8.5* 7.7*  HCT 26.3* 24.4*  PLT 226 236   BMET:  Basename 12/28/11 0340 12/27/11 0530  NA 135 138  K 3.4* 3.2*  CL 100 104  CO2 29 28  GLUCOSE 90 100*  BUN 15 16  CREATININE 0.61 0.59  CALCIUM 8.6 8.3*    PT/INR: No results found for this basename: LABPROT,INR in the last 72 hours Radiology: Dg Chest Port 1 View  12/28/2011  *RADIOLOGY REPORT*  Clinical Data: Follow up cardiac surgery.  PORTABLE CHEST - 1 VIEW  Comparison: 12/27/2011.  Findings: The right PICC line and right upper chest tubes are stable.  The right basilar chest tube has been removed.  Persistent low lung volumes with edema, atelectasis and effusions.  No definite pneumothorax.  IMPRESSION:  1.  Removal of the lower right-sided chest tube. 2.  Persistent edema, atelectasis and effusions.  Original Report Authenticated By: P. Loralie Champagne, M.D.   Dg Chest Port 1 View  12/27/2011  *RADIOLOGY REPORT*  Clinical Data: Postop open heart surgery.  PORTABLE CHEST - 1 VIEW  Comparison: 12/26/2011.  Findings: Trachea is midline.  Heart size is grossly stable. Right PICC tip projects over the SVC.  Lungs are very low in volume with bilateral air space disease, as before.  Right chest tubes are in place.  No definite pneumothorax. Possible left pleural effusion.  IMPRESSION:  1.  No definite pneumothorax with two right chest tubes in place. 2.  Low lung volumes with bilateral air space disease, a  portion of which is due to fibrosis. 3.  Difficult to exclude left pleural effusion.  Original Report Authenticated By: Reyes Ivan, M.D.     Assessment/Plan: S/P Procedure(s) (LRB): MINIMALLY INVASIVE EXCISION OF ATRIAL MYXOMA (N/A) Continue lasix Replace K D/c chest tube in am D/c foley     Delight Ovens MD  Beeper [phone] Office [phone] 12/28/2011 11:02 AM

## 2011-12-28 NOTE — Progress Notes (Signed)
Occupational Therapy Treatment **late entry for 12/27/11** Patient Details Name: Courtney Faulkner MRN: [id number] DOB: [birth year] Today's Date: 12/28/2011 Time: 1127-1200 OT Time Calculation (min): 33 min  OT Assessment / Plan / Recommendation Comments on Treatment Session Pt progressing well- able to ambulate today!    Follow Up Recommendations  Inpatient Rehab    Equipment Recommendations  Defer to next venue    Frequency     Plan Discharge plan remains appropriate    Precautions / Restrictions Precautions Precautions: Fall Precaution Comments: limited RUE movement secondary to myxoma removal, hemiparesis Rside Restrictions Weight Bearing Restrictions: No   Pertinent Vitals/Pain No c/o pain    ADL  Grooming: Performed;Teeth care;Minimal assistance Where Assessed - Grooming: Supported sitting Ambulation Related to ADLs: +2 total A(pt=80%) RW ambulation. limited by dizziness ADL Comments: Unable to perform grooming task standing at sink secondary to dizziness and fatigue. Pt encouraged to brush teeth with RUE and educated to support RUE with LUE. Pt able to open toothpaste with bilateral hands with inc time.    OT Goals ADL Goals ADL Goal: Grooming - Progress: Progressing toward goals ADL Goal: Toilet Transfer - Progress: Progressing toward goals Arm Goals Arm Goal: Additional Goal #1 - Progress: Progressing toward goals  Visit Information  Last OT Received On: 12/27/11 Assistance Needed: +2 PT/OT Co-Evaluation/Treatment: Yes    Subjective Data      Prior Functioning       Cognition  Area of Impairment: Memory Arousal/Alertness: Awake/alert Behavior During Session: [facility] for tasks performed Current Attention Level: Selective Memory Deficits: Pt able to state some sort of surgery today but inaccurate. Pt unable to state hospital but accurately responding given 3 choices and stated city independently Cognition - Other Comments: difficult to assess cognition secondary  to aphasia but much more accurate in answers and automatic responses today. All conversational responses appropriate today.     Mobility Transfers Sit to Stand: 1: +2 Total assist;From chair/3-in-1 Sit to Stand: Patient Percentage: 60% Stand to Sit: To chair/3-in-1;3: Mod assist Details for Transfer Assistance: cueing for use of LUE on armrest with each transfer but pt not following commands to reach back with LUE. 3 trials of sit to stand from chair.    Exercises Shoulder Exercises Shoulder Flexion: AAROM;Both;10 reps;Seated Elbow Flexion: AAROM;Both;10 reps;Seated Wrist Flexion: AAROM;Both;10 reps;Seated Digit Composite Flexion: AAROM;10 reps;Both;Seated Hand Exercises Forearm Supination: AAROM;10 reps;Both;Seated Forearm Pronation: AAROM;Both;10 reps;Seated  Balance    End of Session OT - End of Session Equipment Utilized During Treatment: Gait belt Activity Tolerance: Patient limited by fatigue Patient left: in chair;with call bell/phone within reach Nurse Communication: Mobility status   Penny Arrambide 12/28/2011, 3:22 PM

## 2011-12-28 NOTE — Progress Notes (Signed)
Patient Name: Courtney Faulkner Date of Encounter: 12/28/2011    SUBJECTIVE: Awake and struggling to eat her breakfast because she is unable to cut her food with the right hand. The nurses helping her. We had a nice conversation. She's not able to get any sleep.  TELEMETRY:  Episodes of atrial fibrillation yesterday. This a.m. normal sinus rhythm with frequent PACs.Ceasar Mons Vitals:   12/28/11 0300 12/28/11 0400 12/28/11 0500 12/28/11 0600  BP: 96/54 102/56 104/53 99/55  Pulse: 77 77 75 80  Temp:  98.2 F (36.8 C)    TempSrc:  Oral    Resp: 22 22 23 28   Height:      Weight:   95.5 kg (210 lb 8.6 oz)   SpO2: 100% 100% 100% 100%    Intake/Output Summary (Last 24 hours) at 12/28/11 1108 Last data filed at 12/28/11 0634  Gross per 24 hour  Intake 810.67 ml  Output   2145 ml  Net -1334.33 ml    LABS: Basic Metabolic Panel:  Basename 12/28/11 0340 12/27/11 0530 12/25/11 1700  NA 135 138 --  K 3.4* 3.2* --  CL 100 104 --  CO2 29 28 --  GLUCOSE 90 100* --  BUN 15 16 --  CREATININE 0.61 0.59 --  CALCIUM 8.6 8.3* --  MG -- -- 2.1  PHOS -- -- --   CBC:  Basename 12/28/11 0340 12/27/11 0530  WBC 9.8 11.1*  NEUTROABS -- --  HGB 8.5* 7.7*  HCT 26.3* 24.4*  MCV 93.3 94.6  PLT 226 236    Radiology/Studies:  Persistent pulmonary edema on chest x-ray  Physical Exam: Blood pressure 99/55, pulse 80, temperature 98.2 F (36.8 C), temperature source Oral, resp. rate 28, height 5\' 3"  (1.6 m), weight 95.5 kg (210 lb 8.6 oz), SpO2 100.00%. Weight change: -0.7 kg (-1 lb 8.7 oz)   No rub or gallop is heard.  Neck veins do not appear distended.  Minimal peripheral edema.  Weak right arm and leg with right facial droop and difficulty with speech although improving  ASSESSMENT:  1. Status post removal of left atrial myxoma  2. Paroxysmal atrial fibrillation, currently controlled with oral amiodarone  3. Continued pulmonary congestion.    Plan:  1. Continue amiodarone to  oral therapy.  2. Diuresis as tolerated by blood pressure. Persistent pulmonary congestion likely related to left atrial hypertension due to decreased compliance.  Selinda Eon 12/28/2011, 11:08 AM

## 2011-12-28 NOTE — Progress Notes (Signed)
Subjective:  The patient is s/p excision of left atrial myxoma, day four.  Dr. Cornelius Moras is primary, we are asked to consult for hypoglycemia.  Patient is off dextrose gttp since 12/23/11. Her CBG is stabilized at normal range, except of one episode of CBG 65 on the night of 4/2. Pateient had CBG of 33 yesterday morning, but repeated BMP confimed that her CBG was >130 at that time.   Objective: Vital signs in last 24 hours: Filed Vitals:   12/28/11 0300 12/28/11 0400 12/28/11 0500 12/28/11 0600  BP: 96/54 102/56 104/53 99/55  Pulse: 77 77 75 80  Temp:  98.2 F (36.8 C)    TempSrc:  Oral    Resp: 22 22 23 28   Height:      Weight:   210 lb 8.6 oz (95.5 kg)   SpO2: 100% 100% 100% 100%   Weight change: -1 lb 8.7 oz (-0.7 kg)  Intake/Output Summary (Last 24 hours) at 12/28/11 0911 Last data filed at 12/28/11 1610  Gross per 24 hour  Intake 998.17 ml  Output   2265 ml  Net -1266.83 ml    Physical Exam:  General: in acute distress HEENT: pupils equal round and reactive to light, vision grossly intact, oropharynx clear and non-erythematous, right lower facial droop noted. Neck: supple, no lymphadenopathy Lungs: clear to ascultation bilaterally, normal work of respiration, no wheezes, rales, ronchi Heart: regular rate and rhythm, no murmurs, gallops, or rubs Abdomen: soft, non-tender, non-distended, normal bowel sounds Extremities: no cyanosis, clubbing, or edema Neurologic: more interactive and oriented today.  Right lower facial droop noted, otherwise CN II-VI and VIII-XII intact, strength 4/5 in right shoulder flexion and adduction, 4-5/5 in right elbow flexion/extension, otherwise 5/5 throughout.  Expressive aphasia noted, but improved.  Lab Results: Basic Metabolic Panel:  Lab 12/28/11 9604 12/27/11 0530 12/25/11 1700 12/25/11 0432  NA 135 138 -- --  K 3.4* 3.2* -- --  CL 100 104 -- --  CO2 29 28 -- --  GLUCOSE 90 100* -- --  BUN 15 16 -- --  CREATININE 0.61 0.59 -- --    CALCIUM 8.6 8.3* -- --  MG -- -- 2.1 2.3  PHOS -- -- -- --   Liver Function Tests: No results found for this basename: AST:2,ALT:2,ALKPHOS:2,BILITOT:2,PROT:2,ALBUMIN:2 in the last 168 hours CBC:  Lab 12/28/11 0340 12/27/11 0530  WBC 9.8 11.1*  NEUTROABS -- --  HGB 8.5* 7.7*  HCT 26.3* 24.4*  MCV 93.3 94.6  PLT 226 236   CBG:  Lab 12/27/11 2105 12/27/11 1549 12/27/11 1209 12/27/11 0843 12/27/11 0839 12/27/11 0747  GLUCAP 116* 76 100* 131* 33* 46*   Hemoglobin A1C: No results found for this basename: HGBA1C in the last 168 hours Coagulation:  Lab 12/24/11 1435  LABPROT 17.7*  INR 1.43   Misc. Labs:  Parathyroid hormone: normal Proinsulin/Insulin Level: 11.4 C-Peptide: 4.17 (H)  AM Cortisol: 10.9  Sed Rate: 46  ANA: Neg  Anti-DNA: 1 (Neg)  RF: <10  C3: 118  C4: 51  CRP: 4.85  ANCA: Neg  MPO/PR-3: <1  Micro Results: 4/20 - 1/2 blood cultures with coag-neg staph 4/23 - blood cultures negative  Studies/Results: Dg Chest Port 1 View  12/28/2011  *RADIOLOGY REPORT*  Clinical Data: Follow up cardiac surgery.  PORTABLE CHEST - 1 VIEW  Comparison: 12/27/2011.  Findings: The right PICC line and right upper chest tubes are stable.  The right basilar chest tube has been removed.  Persistent low lung volumes with edema,  atelectasis and effusions.  No definite pneumothorax.  IMPRESSION:  1.  Removal of the lower right-sided chest tube. 2.  Persistent edema, atelectasis and effusions.  Original Report Authenticated By: P. Loralie Champagne, M.D.   Dg Chest Port 1 View  12/27/2011  *RADIOLOGY REPORT*  Clinical Data: Postop open heart surgery.  PORTABLE CHEST - 1 VIEW  Comparison: 12/26/2011.  Findings: Trachea is midline.  Heart size is grossly stable. Right PICC tip projects over the SVC.  Lungs are very low in volume with bilateral air space disease, as before.  Right chest tubes are in place.  No definite pneumothorax. Possible left pleural effusion.  IMPRESSION:  1.  No definite  pneumothorax with two right chest tubes in place. 2.  Low lung volumes with bilateral air space disease, a portion of which is due to fibrosis. 3.  Difficult to exclude left pleural effusion.  Original Report Authenticated By: Reyes Ivan, M.D.   Medications: I have reviewed the patient's current medications. Scheduled Meds:    . acetaminophen  1,000 mg Oral Q6H   Or  . acetaminophen (TYLENOL) oral liquid 160 mg/5 mL  975 mg Per Tube Q6H  . amiodarone (NEXTERONE PREMIX) 360 mg/200 mL dextrose      . amiodarone  200 mg Oral BID PC  . aspirin EC  325 mg Oral Daily   Or  . aspirin  324 mg Per Tube Daily  . bisacodyl  10 mg Oral Daily   Or  . bisacodyl  10 mg Rectal Daily  . docusate sodium  200 mg Oral Daily  . enoxaparin (LOVENOX) injection  40 mg Subcutaneous Q24H  . fluconazole  200 mg Oral Daily  . furosemide  40 mg Intravenous Once  . pantoprazole  40 mg Oral Q1200  . potassium chloride  10 mEq Intravenous Q1 Hr x 3  . potassium chloride  10 mEq Intravenous Q1 Hr x 2  . potassium chloride      . sodium chloride  10-40 mL Intracatheter Q12H  . sodium chloride  3 mL Intravenous Q12H   Continuous Infusions:    . sodium chloride 20 mL/hr at 12/26/11 1200  . sodium chloride Stopped (12/28/11 0517)  . phenylephrine (NEO-SYNEPHRINE) Adult infusion 15 mcg/min (12/25/11 0700)   PRN Meds:.metoprolol, morphine, ondansetron (ZOFRAN) IV, oxyCODONE, potassium chloride, sodium chloride, sodium chloride  Assessment/Plan: The patient is a 61 yo woman, no prior significant medical history, presenting with weakness and dysphagia, found to have an acute CVA, likely secondary to atrial myxoma. S/p excision of left atrial myxoma. Pathology showed benign atrial myxoma.  # Left atrial myxoma - POD #4 s/p resection. -CVTS primary  # Recurrent hypoglycemia - Patient presented with hypoglycemia, which persisted during her first few days of hospitalization.  D5-NS drip discontinued 4/29.   Patient's cbg's currently well-controlled.  Elevated C-peptide, elevated proinsulin, normal insulin levels.  Differential includes insulinoma vs insulin autoimmune hypoglycemia vs infectious etiology (with myxoma as possible source).  CT and MRI abd showed no lesions (though MRI of poor quality due to respiratory motion).  GI consulted, considered EUS for insulinoma, but per CVTS not stable for procedure for several months post-op.  -pending insulin receptor antibodies -  Her CBG is stabilized at normal range, except of one episode of CBG 65 on the night of 4/2. Pateient had CBG of 33 yesterday morning, but epeated BMP confimed that her CBG was >130 at that time.  -I discussed with Dr. Laneta Simmers early this week. He agreed  that we can sign off if patient continues to have stable CBG.  -will sigh off today.  # Acute CVA - likely represents thrombus from atrial myxoma, also considering L ICA dissection (given CT neck results). - Neurology is on board -continue aspirin 81 daily - heparin drip is off on 4/29 -continue PT, OT,  -rehab consult  # Abnormal blood culture result - 1/2 blood cultures from 4/20 showing coag-neg staph, initially concerning for bacteremia, treated with vanc/ceftriaxone.  However, blood cultures 4/23 negative, so staph likely contaminant. -vanc/ceftriaxone discontinued 4/29  # Prophy - SCD's     # we will sigh off today. Please call us if need any further help. Appriciate the the opportunity of seeing this patient.  Lorretta Harp, MD PGY1, Internal Medicine Teaching Service Pager: [phone]     LOS: 14 days   Lorretta Harp 12/28/2011, 9:11 AM

## 2011-12-29 ENCOUNTER — Inpatient Hospital Stay (HOSPITAL_COMMUNITY): Payer: BC Managed Care – PPO

## 2011-12-29 DIAGNOSIS — I6330 Cerebral infarction due to thrombosis of unspecified cerebral artery: Secondary | ICD-10-CM

## 2011-12-29 LAB — BASIC METABOLIC PANEL
BUN: 15 mg/dL (ref 6–23)
CO2: 29 mEq/L (ref 19–32)
Calcium: 8.6 mg/dL (ref 8.4–10.5)
Chloride: 100 mEq/L (ref 96–112)
Creatinine, Ser: 0.65 mg/dL (ref 0.50–1.10)
GFR calc Af Amer: >90 mL/min (ref >90)
GFR calc non Af Amer: >90 mL/min (ref >90)
Glucose, Bld: 91 mg/dL (ref 70–99)
Potassium: 3.2 mEq/L — ABNORMAL LOW (ref 3.5–5.1)
Sodium: 136 mEq/L (ref 135–145)

## 2011-12-29 LAB — CBC
HCT: 25.7 % — ABNORMAL LOW (ref 36.0–46.0)
Hemoglobin: 8.3 g/dL — ABNORMAL LOW (ref 12.0–15.0)
MCH: 30.6 pg (ref 26.0–34.0)
MCHC: 32.3 g/dL (ref 30.0–36.0)
MCV: 94.8 fL (ref 78.0–100.0)
Platelets: 276 10*3/uL (ref 150–400)
RBC: 2.71 MIL/uL — ABNORMAL LOW (ref 3.87–5.11)
RDW: 16.1 % — ABNORMAL HIGH (ref 11.5–15.5)
WBC: 7.8 10*3/uL (ref 4.0–10.5)

## 2011-12-29 LAB — GLUCOSE, CAPILLARY

## 2011-12-29 MED ORDER — METOPROLOL TARTRATE 1 MG/ML IV SOLN
2.5000 mg | INTRAVENOUS | Status: DC | PRN
  Administered 2011-12-30: 5 mg via INTRAVENOUS
  Administered 2011-12-30: 2.5 mg via INTRAVENOUS
  Filled 2011-12-29 (×2): qty 5

## 2011-12-29 MED ORDER — INSULIN ASPART 100 UNIT/ML ~~LOC~~ SOLN
0.0000 [IU] | Freq: Three times a day (TID) | SUBCUTANEOUS | Status: DC
  Administered 2012-01-02: 2 [IU] via SUBCUTANEOUS

## 2011-12-29 MED ORDER — SODIUM CHLORIDE 0.9 % IJ SOLN
3.0000 mL | Freq: Two times a day (BID) | INTRAMUSCULAR | Status: DC
  Administered 2011-12-29: 3 mL via INTRAVENOUS
  Administered 2011-12-30: 22:00:00 via INTRAVENOUS
  Administered 2011-12-30: 3 mL via INTRAVENOUS

## 2011-12-29 MED ORDER — MOVING RIGHT ALONG BOOK
Freq: Once | Status: AC
  Administered 2011-12-29: 14:00:00
  Filled 2011-12-29: qty 1

## 2011-12-29 MED ORDER — PANTOPRAZOLE SODIUM 40 MG PO TBEC
40.0000 mg | DELAYED_RELEASE_TABLET | Freq: Every day | ORAL | Status: DC
  Administered 2011-12-30 – 2012-01-02 (×4): 40 mg via ORAL
  Filled 2011-12-29 (×4): qty 1

## 2011-12-29 MED ORDER — ONDANSETRON HCL 4 MG/2ML IJ SOLN: 4.0000 mg | Freq: Four times a day (QID) | INTRAMUSCULAR | Status: DC | PRN

## 2011-12-29 MED ORDER — DOCUSATE SODIUM 100 MG PO CAPS
200.0000 mg | ORAL_CAPSULE | Freq: Every day | ORAL | Status: DC
Start: 2011-12-30 — End: 2012-01-02
  Administered 2011-12-30 – 2012-01-02 (×2): 200 mg via ORAL
  Filled 2011-12-29 (×3): qty 2

## 2011-12-29 MED ORDER — BISACODYL 5 MG PO TBEC: 10.0000 mg | DELAYED_RELEASE_TABLET | Freq: Every day | ORAL | Status: DC | PRN

## 2011-12-29 MED ORDER — WARFARIN VIDEO
Freq: Once | Status: AC
  Administered 2012-01-01: 07:00:00

## 2011-12-29 MED ORDER — POTASSIUM CHLORIDE 10 MEQ/50ML IV SOLN
INTRAVENOUS | Status: AC
  Filled 2011-12-29: qty 50

## 2011-12-29 MED ORDER — SODIUM CHLORIDE 0.9 % IJ SOLN: 3.0000 mL | INTRAMUSCULAR | Status: DC | PRN

## 2011-12-29 MED ORDER — ASPIRIN EC 81 MG PO TBEC
81.0000 mg | DELAYED_RELEASE_TABLET | Freq: Every day | ORAL | Status: DC
  Administered 2011-12-30 – 2012-01-02 (×4): 81 mg via ORAL
  Filled 2011-12-29 (×4): qty 1

## 2011-12-29 MED ORDER — WARFARIN - PHYSICIAN DOSING INPATIENT
Freq: Every day | Status: DC
  Administered 2011-12-30 – 2012-01-01 (×3)

## 2011-12-29 MED ORDER — ONDANSETRON HCL 4 MG PO TABS: 4.0000 mg | ORAL_TABLET | Freq: Four times a day (QID) | ORAL | Status: DC | PRN

## 2011-12-29 MED ORDER — WARFARIN SODIUM 2.5 MG PO TABS
2.5000 mg | ORAL_TABLET | Freq: Once | ORAL | Status: AC
  Administered 2011-12-29: 2.5 mg via ORAL
  Filled 2011-12-29: qty 1

## 2011-12-29 MED ORDER — POTASSIUM CHLORIDE 10 MEQ/50ML IV SOLN
INTRAVENOUS | Status: AC
  Administered 2011-12-29: 10 meq via INTRAVENOUS
  Filled 2011-12-29: qty 50

## 2011-12-29 MED ORDER — WARFARIN SODIUM 2.5 MG PO TABS
2.5000 mg | ORAL_TABLET | Freq: Every day | ORAL | Status: AC
  Administered 2011-12-29: 2.5 mg via ORAL
  Filled 2011-12-29: qty 1

## 2011-12-29 MED ORDER — TRAMADOL HCL 50 MG PO TABS: 50.0000 mg | ORAL_TABLET | ORAL | Status: DC | PRN

## 2011-12-29 MED ORDER — SODIUM CHLORIDE 0.9 % IV SOLN: 250.0000 mL | INTRAVENOUS | Status: DC | PRN

## 2011-12-29 MED ORDER — DEXTROSE 5 % IV SOLN
150.0000 mg | Freq: Once | INTRAVENOUS | Status: AC
  Administered 2011-12-29: 150 mg via INTRAVENOUS
  Filled 2011-12-29: qty 3

## 2011-12-29 MED ORDER — COUMADIN BOOK
Freq: Once | Status: DC
  Administered 2011-12-29: 14:00:00
  Filled 2011-12-29: qty 1

## 2011-12-29 MED ORDER — METOPROLOL TARTRATE 12.5 MG HALF TABLET
12.5000 mg | ORAL_TABLET | Freq: Two times a day (BID) | ORAL | Status: DC
Start: 2011-12-29 — End: 2011-12-30
  Administered 2011-12-29 (×2): 12.5 mg via ORAL
  Filled 2011-12-29 (×4): qty 1

## 2011-12-29 MED ORDER — BISACODYL 10 MG RE SUPP: 10.0000 mg | Freq: Every day | RECTAL | Status: DC | PRN

## 2011-12-29 NOTE — Progress Notes (Signed)
Patient ID: Courtney Faulkner, female   DOB: 11/07/1950, 61 y.o.   MRN: [id number] T[facility] DAILY PROGRESS NOTE                   [address]            [place] [address]          [id number]      5 Days Post-Op Procedure(s) (LRB): MINIMALLY INVASIVE EXCISION OF ATRIAL MYXOMA (N/A)  Total Length of Stay:  LOS: 15 days   Subjective: Neuro stable, not walking much, none yesterday   Objective: Vital signs in last 24 hours: Temp:  [98 F (36.7 C)-99.6 F (37.6 C)] 99.6 F (37.6 C) (05/05 0831) Pulse Rate:  [26-141] 85  (05/05 0700) Cardiac Rhythm:  [-] Normal sinus rhythm (05/05 0700) Resp:  [25-34] 30  (05/05 0700) BP: (84-107)/(37-70) 100/54 mmHg (05/05 0700) SpO2:  [58 %-100 %] 100 % (05/05 0700) Weight:  [213 lb 10 oz (96.9 kg)] 213 lb 10 oz (96.9 kg) (05/05 0600)  Filed Weights   12/27/11 0600 12/28/11 0500 12/29/11 0600  Weight: 212 lb 1.3 oz (96.2 kg) 210 lb 8.6 oz (95.5 kg) 213 lb 10 oz (96.9 kg)    Weight change: 3 lb 1.4 oz (1.4 kg)   Hemodynamic parameters for last 24 hours:    Intake/Output from previous day: 05/04 0701 - 05/05 0700 In: 850 [P.O.:280; I.V.:420; IV Piggyback:150] Out: 966 [Urine:806; Chest Tube:160]  Intake/Output this shift:    Current Meds: Scheduled Meds:   . acetaminophen  1,000 mg Oral Q6H   Or  . acetaminophen (TYLENOL) oral liquid 160 mg/5 mL  975 mg Per Tube Q6H  . amiodarone (NEXTERONE) IV bolus only 150 mg/100 mL  150 mg Intravenous Once  . amiodarone (NEXTERONE PREMIX) 360 mg/200 mL dextrose      . amiodarone  200 mg Oral BID PC  . aspirin EC  325 mg Oral Daily   Or  . aspirin  324 mg Per Tube Daily  . bisacodyl  10 mg Oral Daily   Or  . bisacodyl  10 mg Rectal Daily  . coumadin book   Does not apply Once  . docusate sodium  200 mg Oral Daily  . enoxaparin (LOVENOX) injection  40 mg Subcutaneous Q24H  . fluconazole  200 mg Oral Daily  . furosemide  40 mg Intravenous Once  . pantoprazole  40 mg Oral  Q1200  . potassium chloride      . potassium chloride      . sodium chloride  10-40 mL Intracatheter Q12H  . sodium chloride  3 mL Intravenous Q12H  . warfarin  2.5 mg Oral Daily  . warfarin   Does not apply Once   Continuous Infusions:   . sodium chloride 20 mL/hr at 12/26/11 1200  . sodium chloride 20 mL/hr at 12/29/11 0224  . phenylephrine (NEO-SYNEPHRINE) Adult infusion 15 mcg/min (12/25/11 0700)   PRN Meds:.metoprolol, morphine, ondansetron (ZOFRAN) IV, oxyCODONE, potassium chloride, sodium chloride, sodium chloride  General appearance: alert and cooperative Heart: normal apical impulse and irregularly irregular rhythm Lungs: clear to auscultation bilaterally Wound: stable, ct still in as are pacing wires  Lab Results: CBC: Basename 12/29/11 0440 12/28/11 0340  WBC 7.8 9.8  HGB 8.3* 8.5*  HCT 25.7* 26.3*  PLT 276 226   BMET:  Basename 12/29/11 0440 12/28/11 0340  NA 136 135  K 3.2* 3.4*  CL 100 100  CO2 29 29  GLUCOSE 91 90  BUN 15 15  CREATININE 0.65 0.61  CALCIUM 8.6 8.6    PT/INR: No results found for this basename: LABPROT,INR in the last 72 hours Radiology: Dg Chest Port 1 View  12/29/2011  *RADIOLOGY REPORT*  Clinical Data: Status post surgery, chest tube  PORTABLE CHEST - 1 VIEW  Comparison: Dec 28, 2011  Findings: The right upper extremity PICC line tip is stable at the cavoatrial junction.  The right chest tube is stable over the upper lobe.  No pneumothorax.  There is a small right pleural effusion, unchanged.  There is also a small, stable left pleural effusion and bibasilar atelectasis versus infiltrates.  There is pulmonary vascular cephalization without frank edema.  IMPRESSION: No significant change in small bilateral pleural effusions, pulmonary vascular cephalization, and bibasilar atelectasis versus infiltrate.  Original Report Authenticated By: Brandon Melnick, M.D.   Dg Chest Port 1 View  12/28/2011  *RADIOLOGY REPORT*  Clinical Data: Follow up  cardiac surgery.  PORTABLE CHEST - 1 VIEW  Comparison: 12/27/2011.  Findings: The right PICC line and right upper chest tubes are stable.  The right basilar chest tube has been removed.  Persistent low lung volumes with edema, atelectasis and effusions.  No definite pneumothorax.  IMPRESSION:  1.  Removal of the lower right-sided chest tube. 2.  Persistent edema, atelectasis and effusions.  Original Report Authenticated By: P. Loralie Champagne, M.D.     Assessment/Plan: S/P Procedure(s) (LRB): MINIMALLY INVASIVE EXCISION OF ATRIAL MYXOMA (N/A) Mobilize Diuresis d/c pacing wires d/c tubes/lines PT OT to work with matient to [employer] Still in afib from last night Start coumadin     Delight Ovens MD  Beeper [phone] Office [phone] 12/29/2011 9:38 AM

## 2011-12-29 NOTE — Progress Notes (Signed)
Stroke Team Progress Note  HISTORY Courtney Faulkner is an 61 y.o. female with dysarthria, aphasia and right sided weakness. According to son at bedside, his father heard her vaccuming at ~08:30 on 12/14/2011. He came down stairs at 11:30 and found her confused and dysarthric. EMS called and found BS 36. Patient was subsequently brought to the ED. She arrive at 1214. In the ED patient was given some orange juice and her blood CBG went to 166. Patient subsequently had a head CT done which was negative. Initially a code stroke was called however it was canceled secondary to patient's hypoglycemic spells. Per family patient was recently been treated for upper respiratory symptoms with prednisone and albuterol. Patient and family deny any recent fevers, no chills, no chest pain, no diarrhea, no constipation, no dysuria, no generalized weakness. Patient's family does endorse some shortness of breath on exertion when patient climbs up stairs which has been ongoing for the past 2 weeks. On exam and during the interview patient has some dysarthric speech is alert to self only and has an incoherent speech as well. After treatment, CBG 166, pt remains confused, and having difficulty following commands.  Neuro consult called by Dr. Janee Morn for this reason. Patient was not a TPA candidate secondary to presenting beyond the tPA window. She was transferred to the ICU after admission for hypoglycemia and neuro decline for further evaluation and treatment.  SUBJECTIVE Had bouts of AFIB and is now on amiodarone. Neurologically stable.  OBJECTIVE Most recent Vital Signs: Filed Vitals:   12/29/11 0800 12/29/11 0831 12/29/11 0900 12/29/11 1000  BP: 100/54  90/39 111/51  Pulse: 80  92 94  Temp:  99.6 F (37.6 C)    TempSrc:  Oral    Resp: 29  30 32  Height:      Weight:      SpO2: 100%  100% 100%   CBG (last 3)   Basename 12/29/11 0937 12/29/11 0828 12/28/11 2105  GLUCAP 84 66* 118*   Intake/Output from previous  day: 05/04 0701 - 05/05 0700 In: 850 [P.O.:280; I.V.:420; IV Piggyback:150] Out: 966 [Urine:806; Chest Tube:160]  IV Fluid Intake:      . sodium chloride 20 mL/hr at 12/26/11 1200  . sodium chloride 20 mL/hr at 12/29/11 0224  . phenylephrine (NEO-SYNEPHRINE) Adult infusion 15 mcg/min (12/25/11 0700)   MEDICATIONS     . acetaminophen  1,000 mg Oral Q6H   Or  . acetaminophen (TYLENOL) oral liquid 160 mg/5 mL  975 mg Per Tube Q6H  . amiodarone (NEXTERONE) IV bolus only 150 mg/100 mL  150 mg Intravenous Once  . amiodarone (NEXTERONE PREMIX) 360 mg/200 mL dextrose      . amiodarone  200 mg Oral BID PC  . aspirin EC  325 mg Oral Daily   Or  . aspirin  324 mg Per Tube Daily  . bisacodyl  10 mg Oral Daily   Or  . bisacodyl  10 mg Rectal Daily  . coumadin book   Does not apply Once  . docusate sodium  200 mg Oral Daily  . enoxaparin (LOVENOX) injection  40 mg Subcutaneous Q24H  . fluconazole  200 mg Oral Daily  . furosemide  40 mg Intravenous Once  . pantoprazole  40 mg Oral Q1200  . potassium chloride      . potassium chloride      . sodium chloride  10-40 mL Intracatheter Q12H  . sodium chloride  3 mL Intravenous Q12H  . warfarin  2.5 mg Oral q1800  . warfarin   Does not apply Once  . Warfarin - Physician Dosing Inpatient   Does not apply q1800   PRN:  metoprolol, morphine, ondansetron (ZOFRAN) IV, oxyCODONE, potassium chloride, sodium chloride, sodium chloride  Diet:  General Activity:   ambulate DVT Prophylaxis:  Lovenox 40 mg sq daily   CLINICALLY SIGNIFICANT STUDIES CBC    Component Value Date/Time   WBC 7.8 12/29/2011 0440   RBC 2.71* 12/29/2011 0440   HGB 8.3* 12/29/2011 0440   HCT 25.7* 12/29/2011 0440   PLT 276 12/29/2011 0440   MCV 94.8 12/29/2011 0440   MCH 30.6 12/29/2011 0440   MCHC 32.3 12/29/2011 0440   RDW 16.1* 12/29/2011 0440   LYMPHSABS 1.9 12/18/2011 0303   MONOABS 0.9 12/18/2011 0303   EOSABS 0.3 12/18/2011 0303   BASOSABS 0.1 12/18/2011 0303   CMP      Component Value Date/Time   NA 136 12/29/2011 0440   K 3.2* 12/29/2011 0440   CL 100 12/29/2011 0440   CO2 29 12/29/2011 0440   GLUCOSE 91 12/29/2011 0440   BUN 15 12/29/2011 0440   CREATININE 0.65 12/29/2011 0440   CALCIUM 8.6 12/29/2011 0440   PROT 6.5 12/20/2011 1818   ALBUMIN 2.4* 12/20/2011 1818   AST 32 12/20/2011 1818   ALT 30 12/20/2011 1818   ALKPHOS 54 12/20/2011 1818   BILITOT 0.2* 12/20/2011 1818   GFRNONAA >90 12/29/2011 0440   GFRAA >90 12/29/2011 0440   COAGS Lab Results  Component Value Date   INR 1.43 12/24/2011   INR 1.05 12/20/2011   INR 1.04 12/17/2011   Lipid Panel    Component Value Date/Time   CHOL 134 12/16/2011 0311   TRIG 90 12/16/2011 0311   HDL 45 12/16/2011 0311   CHOLHDL 3.0 12/16/2011 0311   VLDL 18 12/16/2011 0311   LDLCALC 71 12/16/2011 0311   HgbA1C  Lab Results  Component Value Date   HGBA1C 5.1 12/20/2011   Cardiac Panel (last 3 results)  No results found for this basename: CKTOTAL:3,CKMB:3,TROPONINI:3,RELINDX:3 in the last 72 hours Urinalysis    Component Value Date/Time   COLORURINE YELLOW 12/23/2011 1320   APPEARANCEUR CLOUDY* 12/23/2011 1320   LABSPEC 1.029 12/23/2011 1320   PHURINE 6.0 12/23/2011 1320   GLUCOSEU NEGATIVE 12/23/2011 1320   HGBUR NEGATIVE 12/23/2011 1320   BILIRUBINUR NEGATIVE 12/23/2011 1320   KETONESUR NEGATIVE 12/23/2011 1320   PROTEINUR NEGATIVE 12/23/2011 1320   UROBILINOGEN 0.2 12/23/2011 1320   NITRITE NEGATIVE 12/23/2011 1320   LEUKOCYTESUR MODERATE* 12/23/2011 1320   Urine Drug Screen  No results found for this basename: labopia,  cocainscrnur,  labbenz,  amphetmu,  thcu,  labbarb    Alcohol Level No results found for this basename: eth   VITAMIN B12     Status: Normal   Collection Time   12/15/11  9:00 AM      Component Value Range   Vitamin B-12 287  211 - 911 (pg/mL)  IRON AND TIBC     Status: Abnormal   Collection Time   12/15/11  9:00 AM      Component Value Range   Iron 39 (*) 42 - 135 (ug/dL)   TIBC 098 (*) 119 - 147  (ug/dL)   Saturation Ratios 17 (*) 20 - 55 (%)   UIBC 192  125 - 400 (ug/dL)  FERRITIN     Status: Normal   Collection Time   12/15/11  9:00 AM  Component Value Range   Ferritin 200  10 - 291 (ng/mL)  RETICULOCYTES     Status: Normal   Collection Time   12/15/11  9:00 AM      Component Value Range   Retic Ct Pct 2.9  0.4 - 3.1 (%)   RBC. 3.97  3.87 - 5.11 (MIL/uL)   Retic Count, Manual 115.1  19.0 - 186.0 (K/uL)  PRO B NATRIURETIC PEPTIDE     Status: Abnormal   Collection Time   12/15/11  9:00 AM      Component Value Range   Pro B Natriuretic peptide (BNP) 441.6 (*) 0 - 125 (pg/mL)   CT of the brain   12/15/2011  Developing cytotoxic edema correlates with the observed pattern of restricted diffusion on MR, representing acute infarction. This primarily involves the left basal ganglia.  Smaller areas of cortical and subcortical infarction seen on MRI in the left hemisphere are too small to be easily visible on CT.   No incipient uncal or transtentorial herniation, although minimal 1 mm left to right shift may be present.  No hemorrhagic transformation.    12/14/2011  Normal CT of the head without contrast.   CT Angio Head 12/16/2011  Acute non hemorrhagic infarct left basal ganglia, left uncus and left corona radiata.  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  Collateral flow to the left anterior cerebral artery and left middle cerebral artery branches.  The flow within the left middle cerebral artery branches is diminished suggesting that collateral flow is not adequate.   CT Angio Neck 12/16/2011  Abnormal appearance of the left internal carotid artery with appearance of dissection with complete occlusion at the C2 level.  Abnormal appearance of the proximal left common carotid artery. Question if this is related to artifact versus dissection versus atherosclerotic type changes.  Mild narrowing proximal right common carotid artery.  Prominent ectasia of the proximal right common carotid  artery with fold and mild narrowing.  Mild irregularity at the right carotid bifurcation without measurable stenosis.  Mild to moderate narrowing proximal right vertebral artery.  Mild irregularity throughout the course of the right vertebral artery.  Left vertebral artery arises directly from the aortic arch. Mild narrowing proximal left vertebral artery.  Evaluation of portions of the proximal left vertebral artery is limited by artifact.  Nonspecific parenchymal changes lung apices greater on the right. Findings can be evaluated with a formal chest CT.   MRI of the brain  12/14/2011  Moderate sized acute left hemisphere infarct which appears to be secondary to an acute left internal carotid artery occlusion. No visible hemorrhage or midline shift.  Emboli to the left middle cerebral artery and its branches is not excluded.  MRA of the brain  See CT angio  2D Echocardiogram  EF 60-65% with no source of embolus.  Left atrial mass attached to intra-atrial septum highly suggestive of myxoma.  Carotid Doppler  Right: no ICA stenosis. Left: ICA appears occluded. Bilateral vertebral artery flow is antegrade.  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  CXR  12/26/2011 Low lung volumes with associated bibasilar volume loss. Little interval change in the overall pulmonary vascular congestion.  12/25/2011  1. Support apparatus, as above. 2. Low lung volumes with worsening bibasilar aeration likely reflect increasing atelectasis with superimposed small bilateral pleural effusions. 3. There may be some mild interstitial pulmonary edema as well. 12/24/2011 Bibasilar atelectasis, greater on the  right. Question minimal perihilar edema. Tip of endotracheal tube is 3 mm from carina directed into the right mainstem bronchus, recommend withdrawal 2 cm. Recommend clinical confirmation of the position of the left jugular catheter as above. 12/20/2011 1. Improvement  in pulmonary edema. 2. Cardiac enlargement, low lung volumes and bibasilar atelectasis.  12/18/2011 Some increase in airspace disease likely due to progressive edema. 12/16/2011  Appearance of chest is similar to the recent prior study, again suggesting a background of mild interstitial pulmonary edema. Overall, aeration has slightly improved, with decreasing small bilateral pleural effusions. 12/14/2011   Cardiomegaly and bilateral airspace disease most consistent with pulmonary edema.     CT chest 12/17/2011 Respiratory motion degraded examination with poor inspiration. Scattered pulmonary parenchymal changes some which appears to represent scarring/interstitial fibrosis. Right upper lobe focal bronchiectasis. Scattered regions of mild consolidation may represent scarring although difficult to exclude superimposed infection  CT Abdomen and Pelvis 12/17/2011 On this unenhanced examination, no definitive pancreatic masses identified however, evaluation without contrast is markedly limited. For detection of a neuroendocrine tumor, preferable examination is contrast enhanced MR. If the patient cannot hold her breath, CT with contrast with pancreatic protocol can be obtained. Very small amount of free fluid. Etiology indeterminate.  Cardiac Cath 12/18/2011  1. Normal coronary arteries. 2. Normal abdominal aorta, iliac and femoral arteries. 3. Left atrial myxoma identified by neovascularization during right coronary angiography. 4. Left ventriculography was not performed.  MRI Abdomen 12/22/2011 1. Diminished exam detail due to respiratory motion artifact. 2. No pancreatic mass identified. 3. Indeterminant T2 intense structure within the right hepatic lobe. Not imaged on the T1-weighted images were the postcontrast images. Because of the patient's difficulty with breath-hold I would advise a three-phase liver imaging with contrast enhanced CT of the abdomen for more definitive assessment.  EKG  normal sinus  rhythm.   Physical Exam   Pleasant middle-age [place] American lady currently not in distress.Awake alert. Afebrile. Head is nontraumatic. Neck is supple without bruit. Hearing is normal. Cardiac exam no murmur or gallop. Lungs are clear to auscultation. Distal pulses are well felt.  Neurological Exam ; awake alert mild expressive aphasia with word finding difficulties and paraphasias.verbal perseveration. Good comprehension  and repitition but poor naming.. Able to follow commands well. Extraocular movements are full range without nystagmus. No visual field defect to bedside confrontational testing. Mild right lower facial weakness. Tongue is midline. Motor system exam reveals mild right hemiparesis with RUE 3/5 weaknessand moderate right grip and intrinsic hand muscles. Good right lower extremity strength without drift. The minimum right hip flexion and ankle dorsiflexion weakness. Sensation slightly diminished on the right compared to the left. Coordination is slow but accurate. Gait was not tested.  ASSESSMENT Courtney Faulkner is a 61 y.o. 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 Status post successful myxoma excision by Dr. Cornelius Moras 12/24/2011. Now in SICU post op. Neuro status stable. On no antiplatelets prior to admission. Now on aspirin 325 mg orally every day for secondary stroke prevention. Patient with resultant right hemiparesis and expressive aphasia with overall improvement,   Therapies have been ordered for ambulation. 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 Still with 1 chest tube in place -high risk for post op arrhythmias. Plan amiodarone now and at discharge -left ICA occlusion suspect secondary to myxoma emboli vs dissection-unclear as dental artefacts interfere with images.will check repeat CT angio  in 2-3 weeks  With neck  extension to avoid dental artefacts as incase of myxomatous occlusion may need tumor removal by vascular surgery -tachycardia -hypoglycemia, recurrent on admission. Per IM - elevated C-peptide, elevated proinsulin, normal insulin levels. Differential includes insulinoma vs insulin autoimmune hypoglycemia vs infectious etiology (with myxoma as possible source). CT and MRI abd showed no lesions. GI recommend EUS for insulinoma, per CVTS not stable for procedure for several months post-op. monitoring CBGs -elevated cardiac enzymes on admission, cardiac related.  -short PR -obesity, class 2, Body mass index is 37.84 kg/(m^2). -hypotension -leukocytosis  Hospital day # 15  TREATMENT/PLAN -IP Rehab planned at discharge -as late in the hospital stay as possible, we need to do a CTA with chin up to remove dental artifact in order to determine ICA dissection vs tumor. Please contact Dr. Pearlean Brownie once patient is approaching discharge from rehab. Dr. Pearlean Brownie has discussed case with Dr. Constance Goltz. When ordering CTA, add text:  "with chin up" and "if question, call Dr. Constance Goltz" -warfarin when ok from cardiac surgery standpoint.   Delia Heady , MD Pager:  [phone]     12/29/2011 11:23 AM  ,

## 2011-12-29 NOTE — Progress Notes (Signed)
CRITICAL VALUE ALERT  Critical value received:  CBG 66  Date of notification:  12/29/11  Time of notification:  0845  Critical value read back:yes  Nurse who received alert:  Orvis Brill, RN    Dr. Tyrone Sage at bedside and aware.  Pt CAO x 4, asymptomatic, and in the process of eating breakfast.  Repeat CBG at 0930 = 84.

## 2011-12-29 NOTE — Progress Notes (Addendum)
Patient Name: Courtney Faulkner Date of Encounter: 12/29/2011    SUBJECTIVE: Says she is not resting. Did get sleep last evening. This is an improvement. Has palpitations.  TELEMETRY:  Bouts of atrial fibrillation and frequent PACs appear: Filed Vitals:   12/29/11 0800 12/29/11 0831 12/29/11 0900 12/29/11 1000  BP: 100/54  90/39 111/51  Pulse: 80  92 94  Temp:  99.6 F (37.6 C)    TempSrc:  Oral    Resp: 29  30 32  Height:      Weight:      SpO2: 100%  100% 100%    Intake/Output Summary (Last 24 hours) at 12/29/11 1056 Last data filed at 12/29/11 1000  Gross per 24 hour  Intake    820 ml  Output   1061 ml  Net   -241 ml    LABS: Basic Metabolic Panel:  Basename 12/29/11 0440 12/28/11 0340  NA 136 135  K 3.2* 3.4*  CL 100 100  CO2 29 29  GLUCOSE 91 90  BUN 15 15  CREATININE 0.65 0.61  CALCIUM 8.6 8.6  MG -- --  PHOS -- --   CBC:  Basename 12/29/11 0440 12/28/11 0340  WBC 7.8 9.8  NEUTROABS -- --  HGB 8.3* 8.5*  HCT 25.7* 26.3*  MCV 94.8 93.3  PLT 276 226    Radiology/Studies:   Chest X ray IMPRESSION:  No significant change in small bilateral pleural effusions,  pulmonary vascular cephalization, and bibasilar atelectasis versus  infiltrate.  Original Report Authenticated By: Brandon Melnick, M.D.   Physical Exam: Blood pressure 111/51, pulse 94, temperature 99.6 F (37.6 C), temperature source Oral, resp. rate 32, height 5\' 3"  (1.6 m), weight 96.9 kg (213 lb 10 oz), SpO2 100.00%. Weight change: 1.4 kg (3 lb 1.4 oz)   No rub, click, or gallop. Irregular rhythm.  Right-sided weakness. Expressive aphasia.  ASSESSMENT:  1. Postoperative atrial arrhythmias including atrial fibrillation  2. Amiodarone therapy.  3. CVA with expressive aphasia and right-sided weakness  4. Hypokalemia   Plan:  1. Continue amiodarone to control the atrial arrhythmia.  2. Antithrombotic therapy to prevent recurrent CVA  3. Physical therapy, inpatient  4.  Replete potassium  Signed, Lesleigh Noe 12/29/2011, 10:56 AM

## 2011-12-30 ENCOUNTER — Inpatient Hospital Stay (HOSPITAL_COMMUNITY): Payer: BC Managed Care – PPO

## 2011-12-30 DIAGNOSIS — I6330 Cerebral infarction due to thrombosis of unspecified cerebral artery: Secondary | ICD-10-CM

## 2011-12-30 LAB — CBC
HCT: 26.7 % — ABNORMAL LOW (ref 36.0–46.0)
Hemoglobin: 8.5 g/dL — ABNORMAL LOW (ref 12.0–15.0)
MCH: 30 pg (ref 26.0–34.0)
MCHC: 31.8 g/dL (ref 30.0–36.0)
MCV: 94.3 fL (ref 78.0–100.0)
Platelets: 360 10*3/uL (ref 150–400)
RBC: 2.83 MIL/uL — ABNORMAL LOW (ref 3.87–5.11)
RDW: 16 % — ABNORMAL HIGH (ref 11.5–15.5)
WBC: 9.2 10*3/uL (ref 4.0–10.5)

## 2011-12-30 LAB — BASIC METABOLIC PANEL
BUN: 14 mg/dL (ref 6–23)
CO2: 28 mEq/L (ref 19–32)
Calcium: 8.9 mg/dL (ref 8.4–10.5)
Chloride: 102 mEq/L (ref 96–112)
Creatinine, Ser: 0.62 mg/dL (ref 0.50–1.10)
GFR calc Af Amer: >90 mL/min (ref >90)
GFR calc non Af Amer: >90 mL/min (ref >90)
Glucose, Bld: 98 mg/dL (ref 70–99)
Potassium: 3.6 mEq/L (ref 3.5–5.1)
Sodium: 137 mEq/L (ref 135–145)

## 2011-12-30 LAB — GLUCOSE, CAPILLARY
Glucose-Capillary: 93 mg/dL (ref 70–99)
Glucose-Capillary: 90 mg/dL (ref 70–99)
Glucose-Capillary: 103 mg/dL — ABNORMAL HIGH (ref 70–99)
Glucose-Capillary: 99 mg/dL (ref 70–99)

## 2011-12-30 MED ORDER — POTASSIUM CHLORIDE 10 MEQ/50ML IV SOLN
10.0000 meq | INTRAVENOUS | Status: AC
  Administered 2011-12-30 (×3): 10 meq via INTRAVENOUS
  Filled 2011-12-30: qty 50

## 2011-12-30 MED ORDER — METOPROLOL TARTRATE 25 MG PO TABS
25.0000 mg | ORAL_TABLET | Freq: Once | ORAL | Status: AC
  Administered 2011-12-30: 25 mg via ORAL
  Filled 2011-12-30: qty 1

## 2011-12-30 MED ORDER — WARFARIN SODIUM 2.5 MG PO TABS
2.5000 mg | ORAL_TABLET | Freq: Every day | ORAL | Status: DC
  Administered 2011-12-30: 2.5 mg via ORAL
  Filled 2011-12-30 (×2): qty 1

## 2011-12-30 MED ORDER — METOPROLOL TARTRATE 50 MG PO TABS
50.0000 mg | ORAL_TABLET | Freq: Three times a day (TID) | ORAL | Status: DC
  Administered 2011-12-30 – 2011-12-31 (×2): 50 mg via ORAL
  Filled 2011-12-30 (×6): qty 1

## 2011-12-30 MED ORDER — DEXTROSE 5 % IV SOLN
150.0000 mg | Freq: Once | INTRAVENOUS | Status: AC
  Administered 2011-12-30: 150 mg via INTRAVENOUS
  Filled 2011-12-30: qty 3

## 2011-12-30 MED ORDER — POTASSIUM CHLORIDE 10 MEQ/50ML IV SOLN
INTRAVENOUS | Status: AC
  Administered 2011-12-30: 10 meq via INTRAVENOUS
  Filled 2011-12-30: qty 50

## 2011-12-30 MED ORDER — FUROSEMIDE 40 MG PO TABS
40.0000 mg | ORAL_TABLET | Freq: Every day | ORAL | Status: DC
  Administered 2011-12-30 – 2011-12-31 (×2): 40 mg via ORAL
  Filled 2011-12-30 (×4): qty 1

## 2011-12-30 MED ORDER — METOPROLOL TARTRATE 25 MG PO TABS
25.0000 mg | ORAL_TABLET | Freq: Two times a day (BID) | ORAL | Status: DC
  Administered 2011-12-30: 25 mg via ORAL
  Filled 2011-12-30 (×2): qty 1

## 2011-12-30 MED ORDER — POTASSIUM CHLORIDE 10 MEQ/50ML IV SOLN
INTRAVENOUS | Status: AC
  Administered 2011-12-30: 10 meq
  Filled 2011-12-30: qty 50

## 2011-12-30 MED ORDER — POTASSIUM CHLORIDE 10 MEQ/50ML IV SOLN
10.0000 meq | INTRAVENOUS | Status: DC | PRN
  Administered 2011-12-30 (×2): 10 meq via INTRAVENOUS
  Filled 2011-12-30: qty 50
  Filled 2011-12-30: qty 150
  Filled 2011-12-30: qty 50

## 2011-12-30 MED ORDER — AMIODARONE HCL 200 MG PO TABS
400.0000 mg | ORAL_TABLET | Freq: Two times a day (BID) | ORAL | Status: DC
  Administered 2011-12-30 – 2012-01-02 (×7): 400 mg via ORAL
  Filled 2011-12-30 (×9): qty 2

## 2011-12-30 NOTE — Care Management Note (Signed)
    Page 1 of 1   12/30/2011     5:13:50 PM   CARE MANAGEMENT NOTE 12/30/2011  Patient:  Courtney Faulkner, Courtney Faulkner   Account Number:  [id number]  Date Initiated:  12/23/2011  Documentation initiated by:  Ronny Flurry  Subjective/Objective Assessment:   DX: CVA , left atrial mass, likely myxoma vs possible papillary fibroelastoma     Action/Plan:   Surgery planned for 12-24-11   Anticipated DC Date:  01/04/2012   Anticipated DC Plan:  IP REHAB FACILITY      DC Planning Services  CM consult      Choice offered to / List presented to:             Status of service:  In process, will continue to follow Medicare Important Message given?   (If response is "NO", the following Medicare IM given date fields will be blank) Date Medicare IM given:   Date Additional Medicare IM given:    Discharge Disposition:    Per UR Regulation:  Reviewed for med. necessity/level of care/duration of stay  If discussed at Long Length of Stay Meetings, dates discussed:    Comments:  12/30/11 Eldra Word,RN,BSN PT'S HOSPITAL STAY COMPLICATED BY AFIB WITH RVR.  WILL FOLLOW FOR DC PLANNING.  CIR STILL FOLLOWING FOR POSSIBLE ADMISSION.  12/25/11 Tiffaney Heimann,RN,BSN 1510 PT S/P LT ATRIAL MYXOMA EXCISION ON 12/24/11 WITH STROKE PROPERATIVELY.  PHYSICAL THERAPY RECOMMENDING INPT REHAB. MD:  PLEASE LEAVE ORDER FOR REHAB CONSULT WHEN APPROPRIATE. THANKS.

## 2011-12-30 NOTE — Progress Notes (Signed)
Physical Therapy Cancellation Note: pt currently with Afib vith RVR supine and HR bouncing between 85-150 and not appropriate for therapy at this time. Will defer to next date. Thanks Delaney Meigs, PT [phone]

## 2011-12-30 NOTE — Progress Notes (Signed)
I contacted pt's husband by phone. Briefly discussed rehab venue options. He stated their daughter is coming from [place] on Wednesday and he is hopeful she can assist in pt's care through the summer. I will meet with him further at that time to clarify inpt rehab venue vs snf rehab venue options. I am emphasizing that inpt rehab is recommended at this time. Please call [phone] with questions.

## 2011-12-30 NOTE — PMR Pre-admission (Signed)
PMR Admission Coordinator Pre-Admission Assessment  Patient: Courtney Faulkner is an 61 y.o., female MRN: [id number] DOB: 1951-05-06 Height: 5\' 3"  (160 cm) Weight: 88.9 kg (195 lb 15.8 oz)  Insurance Information HMO:   PPO: yes     PCP:      IPA:      80/20:      OTHER:  PRIMARY: [employer] [facility] [employer]      Policy#: [id number]      Subscriber: pt CM Name: Angie      Phone#: [phone]     Fax#: [phone] Pre-Cert#: [id number] update 5/15 after conference      Employer: [employer] Benefits:  Phone #: [phone]     Name: 5/3 Saundra Eff. Date: 04/26/10 active     Deduct: $933      Out of Pocket Max: $3793      Life Max: none CIR: $291 copay per admission then & 70% coverage     SNF: 70/30% Outpatient: 70%     Co-Pay: 30% no max limits Home Health: 70%      Co-Pay: 30% DME: 70%     Co-Pay: 30% Providers: in network  SECONDARY: none       Medicaid Application Date:      Case Manager:  Disability Application Date:       Case Worker:   Emergency Conservator, museum/gallery Information    Name Relation Home Work Mobile   Hofmeister,Michael Spouse [phone]  [phone]     Current Medical History  Patient Admitting Diagnosis: Left MCA territory infarct  History of Present Illness:61 year old right-handed female who 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 Admitted 12/14/2011 was unremarkable past medical history presented with altered mental status and slurred speech. EMS was called by family with noted blood sugar of 34. She was given an amp of D50 with repeat blood sugars of 54. She had initial cranial CT scan that was negative. MRI of the brain showed moderate size acute left hemispheric infarction appeared to be secondary to acute left ICA carotid artery occlusion. CT angiogram head shows acute nonhemorrhagic infarct left basal ganglia and corona radiata. CT angiogram neck with abnormal appearance of the left ICA artery with appearance of dissection with complete occlusion  at the C2 level. Carotid Dopplers with left eyes see a occlusion. Echocardiogram with ejection fraction 65% with no source of embolus. Noted left atrial mass attached to intra-atrial septum highly suggestive of myxoma. TEE identifies 1.7 x 1.7 cm mass left atrial septum again felt to be atrial myxoma. Cardiac catheterization 12/18/2011 with normal coronary arteries. Underwent excision of left atrial myxoma 12/24/2011 per Dr. Barry Dienes. Maintained on aspirin therapy at the recommendations of neurology. Subcutaneous Lovenox initiated for deep vein thrombosis prophylaxis.  Cardiology following for patient with Afib with RVR. Given IV amiodarone and low dose lopressor.. SR currently 01/02/12. Some sob with activity. Continue diuresis, pulmonary toliet, repeat CXR. Some concern with amiodarone.  Neurology recommends a CTA with chin up to remove dental artifact in order to determine ICA dissection vs tumor close to discharge. Dr. Constance Goltz and Dr. Pearlean Brownie can clarify of questions when ordering.  Total: 5     Past Medical History  Past Medical History  Diagnosis Date  . Upper respiratory tract infection   . ICAO (internal carotid artery occlusion), left 12/15/2011  . 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 CVA (cerebral infarction) 12/15/2011  . Hypoglycemia 12/14/2011    Family History  family history is negative for Coronary artery disease.  Prior Rehab/Hospitalizations:  none  Current Medications  Current facility-administered medications:0.9 %  sodium chloride infusion, 250 mL, Intravenous, PRN, Purcell Nails, MD;  acetaminophen (TYLENOL) tablet 650 mg, 650 mg, Oral, Q6H PRN, Purcell Nails, MD;  amiodarone (PACERONE) tablet 400 mg, 400 mg, Oral, BID PC, Purcell Nails, MD, 400 mg at 01/02/12 0757;  aspirin EC tablet 81 mg, 81 mg, Oral, Daily, Delight Ovens, MD, 81 mg at 01/02/12 1000 bisacodyl (DULCOLAX) EC tablet 10 mg, 10 mg, Oral, Daily PRN, Delight Ovens, MD;  bisacodyl (DULCOLAX) suppository 10 mg, 10  mg, Rectal, Daily PRN, Delight Ovens, MD;  docusate sodium (COLACE) capsule 200 mg, 200 mg, Oral, Daily, Delight Ovens, MD, 200 mg at 01/02/12 1000;  enoxaparin (LOVENOX) injection 40 mg, 40 mg, Subcutaneous, Q24H, Purcell Nails, MD, 40 mg at 01/02/12 0757 furosemide (LASIX) injection 40 mg, 40 mg, Intravenous, Q8H, Purcell Nails, MD, 40 mg at 01/01/12 1655;  furosemide (LASIX) tablet 40 mg, 40 mg, Oral, BID, Purcell Nails, MD, 40 mg at 01/02/12 0757;  insulin aspart (novoLOG) injection 0-24 Units, 0-24 Units, Subcutaneous, TID AC & HS, Delight Ovens, MD, 2 Units at 01/02/12 1151;  metoprolol (LOPRESSOR) tablet 100 mg, 100 mg, Oral, BID, Purcell Nails, MD, 100 mg at 01/02/12 1001 ondansetron (ZOFRAN) injection 4 mg, 4 mg, Intravenous, Q6H PRN, Delight Ovens, MD;  ondansetron [facility]) tablet 4 mg, 4 mg, Oral, Q6H PRN, Delight Ovens, MD;  pantoprazole (PROTONIX) EC tablet 40 mg, 40 mg, Oral, QAC breakfast, Delight Ovens, MD, 40 mg at 01/02/12 [phone];  potassium chloride SA (K-DUR,KLOR-CON) CR tablet 40 mEq, 40 mEq, Oral, Daily, Purcell Nails, MD, 40 mEq at 01/02/12 1001 sodium chloride 0.9 % injection 10-40 mL, 10-40 mL, Intracatheter, PRN, Purcell Nails, MD, 10 mL at 01/02/12 0526;  sodium chloride 0.9 % injection 3 mL, 3 mL, Intravenous, Q12H, Purcell Nails, MD, 10 mL at 12/31/11 1458;  sodium chloride 0.9 % injection 3 mL, 3 mL, Intravenous, PRN, Purcell Nails, MD;  traMADol Janean Sark) tablet 50-100 mg, 50-100 mg, Oral, Q4H PRN, Delight Ovens, MD warfarin (COUMADIN) tablet 5 mg, 5 mg, Oral, q1800, Purcell Nails, MD, 5 mg at 01/01/12 1655;  Warfarin - Physician Dosing Inpatient, , Does not apply, q1800, Purcell Nails, MD  Patients Current Diet: Cardiac  Precautions / Restrictions Precautions Precautions: Fall Precaution Comments: limited RUE use secondary to myxoma removal, hemiparesis right side Restrictions Weight Bearing Restrictions: No   Prior Activity  Level Community (5-7x/wk): full time employee Home Assistive Devices / Equipment Home Assistive Devices/Equipment: None Home Adaptive Equipment: None  Prior Functional Level Prior Function Level of Independence: Independent Able to Take Stairs?: Yes Driving: Yes 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  Current Functional Level Cognition  Arousal/Alertness: Awake/alert Overall Cognitive Status: Impaired Overall Cognitive Status: Impaired Difficult to assess due to: Impaired communication Current Attention Level: Selective Memory: Decreased recall of precautions Memory Deficits: impaired recall of precaution to limited use of RUE Orientation Level: Oriented X4 Following Commands: Follows multi-step commands inconsistently Cognition - Other Comments: Difficult to asses due to aphasia, speech more fluent and answers appropriate although some word misuse but pt recognizes this Attention: Sustained Sustained Attention: Impaired Sustained Attention Impairment: Verbal basic Memory:  (To be further assessed) Memory Impairment: Decreased short term memory Decreased Short Term Memory: Verbal basic Awareness: Impaired Awareness Impairment: Emergent impairment;Intellectual impairment;Anticipatory impairment (Emergent is improving from last week) Problem Solving: Impaired  Problem Solving Impairment: Functional complex Safety/Judgment: Impaired                ADLs  Eating/Feeding: Not assessed Grooming: Performed;Teeth care;Minimal assistance Where Assessed - Grooming: Supported sitting Upper Body Bathing: Simulated;Maximal assistance Where Assessed - Upper Body Bathing: Sitting, chair Lower Body Bathing: Simulated;+1 Total assistance Where Assessed - Lower Body Bathing: Sit to stand from chair Upper Body Dressing: Performed;Minimal assistance Where Assessed - Upper Body Dressing: Sitting, bed Lower Body Dressing: Simulated;+1 Total assistance Where  Assessed - Lower Body Dressing: Sit to stand from chair Toilet Transfer: Simulated;+2 Total assistance (pt=70%) Toilet Transfer Method: Ambulating (+2 to bring chair behind and assist with lines/poles) Toilet Transfer Equipment: Other (comment) (recliner) Toileting - Clothing Manipulation: Simulated;+1 Total assistance Where Assessed - Glass blower/designer Manipulation: Standing Toileting - Hygiene: Performed;+1 Total assistance Where Assessed - Toileting Hygiene: Standing (pt unable to assist secondary to fatigue/dizziness) Tub/Shower Transfer: Not assessed Equipment Used: Gait belt Ambulation Related to ADLs: +2 total A(pt=70%) with RW ambulation. Pt limited today by dizziness with standing/ambulation. ADL Comments: RN checked glucose and found to be 61 which is possible source of pt dizziness upon standing    Mobility  Bed Mobility: Sitting - Scoot to Edge of Bed;Supine to Sit Rolling Right: 3: Mod assist Right Sidelying to Sit: 3: Mod assist Supine to Sit: 3: Mod assist;HOB elevated (HOB 20degrees) Supine to Sit: Patient Percentage: 50% Sitting - Scoot to Edge of Bed: 4: Min assist Sit to Sidelying Left: Not Tested (comment)    Transfers  Transfers: Sit to Stand;Stand to Sit Sit to Stand: 3: Mod assist;From bed;From chair/3-in-1 Sit to Stand: Patient Percentage: 60% Stand to Sit: To bed;To chair/3-in-1;3: Mod assist Stand to Sit: Patient Percentage: 60% Stand Pivot Transfers: 3: Mod assist Stand Pivot Transfers: Patient Percentage: 40%    Ambulation / Gait / Stairs / Wheelchair Mobility  Ambulation/Gait Ambulation/Gait Assistance: 1: +2 Total assist Ambulation/Gait: Patient Percentage: 70% Ambulation Distance (Feet): 55 Feet Assistive device: Rolling walker Ambulation/Gait Assistance Details: Cueing and assist to hold head for first half of ambulation for gaze fixation to limit dizziness and progress activity. Pt with cueing for sequence and assist to manipulate RW and direct  pt to maintain gaze fixation. Pt fatigued end of ambulation with return to room pt monitor showing HR 158 with rest down to 120 and another 3 min back to 110, pt reported fatigue only but no dizziness with gait with gaze fixation. Gait Pattern: Step-to pattern;Decreased stance time - right;Trunk flexed Stairs: No Wheelchair Mobility Wheelchair Mobility: No    Posture / Balance Static Sitting Balance Static Sitting - Balance Support: Feet supported;No upper extremity supported Static Sitting - Level of Assistance: 5: Stand by assistance Static Sitting - Comment/# of Minutes: 3 Dynamic Sitting Balance Dynamic Sitting - Balance Support: Feet supported;No upper extremity supported Dynamic Sitting - Level of Assistance: 4: Min assist Reach (Patient is able to reach ___ inches to right, left, forward, back): > 5  Dynamic Sitting - Balance Activities: Lateral lean/weight shifting;Forward lean/weight shifting;Reaching for objects;Reaching across midline Static Standing Balance Static Standing - Balance Support: Bilateral upper extremity supported Static Standing - Level of Assistance: 5: Stand by assistance Static Standing - Comment/# of Minutes: pt able to stand for 80 sec today with cueing for eyes open and to extend hips and trunk Rhomberg - Eyes Opened: 30  Rhomberg - Eyes Closed: 30  High Level Balance High Level Balance Comments: Reaching with RUE in standing--forward x 8  inches; pt missing target on her right and questioned visual field cut   SLP     Aphasia treatment.  Regular diet and thin       liquids  Previous Home Environment Living Arrangements: Spouse/significant other Lives With: Spouse Type of Home: House Home Layout: Two level;Bed/bath upstairs;Full bath on main level Alternate Level Stairs-Rails: Right;Left Alternate Level Stairs-Number of Steps: 12 Home Access: Stairs to enter Entrance Stairs-Rails: None Entrance Stairs-Number of Steps: 3 Bathroom Shower/Tub:  Health visitor: Standard Bathroom Accessibility: Yes How Accessible: Accessible via walker Home Care Services: No  Discharge Living Setting Plans for Discharge Living Setting: Patient's home;Lives with (comment) (spouse) Type of Home at Discharge: House Discharge Home Layout: Two level;Full bath on main level;Able to live on main level with bedroom/bathroom Discharge Home Access: Stairs to enter Entrance Stairs-Number of Steps: 3 steps Discharge Bathroom Shower/Tub: Walk-in shower Discharge Bathroom Toilet: Standard Discharge Bathroom Accessibility: Yes How Accessible: Accessible via walker Do you have any problems obtaining your medications?: No  Social/Family/Support Systems Patient Roles: Spouse;Parent;Other (Comment) (employee) Contact Information: Dylana Shaw, spouse Anticipated Caregiver: husband and daughter Anticipated Caregiver's Contact Information: [facility] home [phone], cell [phone] Ability/Limitations of Caregiver: spouse works, dtr arriving from [place] 5/8 Caregiver Availability: 24/7 Discharge Plan Discussed with Primary Caregiver: Yes Is Caregiver In Agreement with Plan?: Yes Does Caregiver/Family have Issues with Lodging/Transportation while Pt is in Rehab?: No  Goals/Additional Needs Patient/Family Goal for Rehab: supervision to Min PT, min to Mod OT, Mod I SLP Expected length of stay: ELOS 3 weeks Pt/Family Agrees to Admission and willing to participate: Yes Program Orientation Provided & Reviewed with Pt/Caregiver Including Roles  & Responsibilities: Yes  Patient Condition: Please see physician update to information in consult dated 12/26/11.  Preadmission Screen Completed By:  Clois Dupes, 01/02/2012 1:43 PM ______________________________________________________________________   Discussed status with Dr. Wynn Banker on 5/9/3 at  1343 and received telephone approval for admission today.  Admission Coordinator:  Clois Dupes, time 4782 Date 01/02/12.

## 2011-12-30 NOTE — Progress Notes (Addendum)
   CARDIOTHORACIC SURGERY PROGRESS NOTE  6 Days Post-Op  S/P Procedure(s) (LRB): MINIMALLY INVASIVE EXCISION OF ATRIAL MYXOMA (N/A)  Subjective: No complaints other than frustration with speech, difficulty finding words.   Denies pain, shortness of breath  Objective: Vital signs in last 24 hours: Temp:  [99 F (37.2 C)-100.2 F (37.9 C)] 99.5 F (37.5 C) (05/06 0721) Pulse Rate:  [37-101] 92  (05/06 0700) Cardiac Rhythm:  [-] Normal sinus rhythm;Atrial fibrillation (05/06 0400) Resp:  [30-39] 38  (05/06 0700) BP: (78-122)/(39-103) 108/85 mmHg (05/06 0700) SpO2:  [81 %-100 %] 88 % (05/06 0700) Weight:  [96.9 kg (213 lb 10 oz)] 96.9 kg (213 lb 10 oz) (05/06 0442)  Physical Exam:  Rhythm:   Sinus with bursts of Afib  Breath sounds: clear  Heart sounds:  RRR  Incisions:  Clean and dry  Abdomen:  soft  Extremities:  warm   Intake/Output from previous day: 05/05 0701 - 05/06 0700 In: 1193 [P.O.:840; I.V.:203; IV Piggyback:150] Out: 400 [Urine:250; Chest Tube:150] Intake/Output this shift:    Lab Results:  Basename 12/30/11 0400 12/29/11 0440  WBC 9.2 7.8  HGB 8.5* 8.3*  HCT 26.7* 25.7*  PLT 360 276   BMET:  Basename 12/30/11 0400 12/29/11 0440  NA 137 136  K 3.6 3.2*  CL 102 100  CO2 28 29  GLUCOSE 98 91  BUN 14 15  CREATININE 0.62 0.65  CALCIUM 8.9 8.6    CBG (last 3)   Basename 12/30/11 0724 12/29/11 2124 12/29/11 1743  GLUCAP 93 76 125*   PT/INR:   Basename 12/30/11 0400  LABPROT 13.7  INR 1.03    CXR:   *RADIOLOGY REPORT*  Clinical Data: Chest tube removal.  PORTABLE CHEST - 1 VIEW 12/30/2011 Comparison: 12/29/2011.  Findings: Trachea is midline. Heart size stable. Right PICC tip  projects over the SVC. Right chest tube has been removed. No  definite pneumothorax. Linear radiopaque densities project over  the lower right lateral chest, as before.  Lungs are low in volume with bibasilar dependent air space disease.  IMPRESSION:  1. Interval  removal of right chest tube without pneumothorax.  2. Low lung volumes with bibasilar dependent air space disease.  Original Report Authenticated By: Reyes Ivan, M.D.        Assessment/Plan: S/P Procedure(s) (LRB): MINIMALLY INVASIVE EXCISION OF ATRIAL MYXOMA (N/A)  Overall stable now POD6 Primary limitations remain related to preoperative stroke Postop recurrent PAF on amiodarone, low dose lopressor Starting coumadin, no change in INR yet Expected post op acute blood loss anemia, mild, stable Expected post op volume excess, weight still >> baseline but clinically without severe volume overload Hypokalemia   Increase amiodarone and metoprolol for improved rate control  Continue low dose lovenox and coumadin  Supplement potassium  Continue PT, OT, nutritional support  Possible transfer to inpatient rehab service once rhythm stabilized   Tesia Lybrand H 12/30/2011 8:43 AM

## 2011-12-30 NOTE — Progress Notes (Signed)
Stroke Team Progress Note  HISTORY Courtney Faulkner is an 61 y.o. female with dysarthria, aphasia and right sided weakness. According to son at bedside, his father heard her vaccuming at ~08:30 on 12/14/2011. He came down stairs at 11:30 and found her confused and dysarthric. EMS called and found BS 36. Patient was subsequently brought to the ED. She arrive at 1214. In the ED patient was given some orange juice and her blood CBG went to 166. Patient subsequently had a head CT done which was negative. Initially a code stroke was called however it was canceled secondary to patient's hypoglycemic spells. Per family patient was recently been treated for upper respiratory symptoms with prednisone and albuterol. Patient and family deny any recent fevers, no chills, no chest pain, no diarrhea, no constipation, no dysuria, no generalized weakness. Patient's family does endorse some shortness of breath on exertion when patient climbs up stairs which has been ongoing for the past 2 weeks. On exam and during the interview patient has some dysarthric speech is alert to self only and has an incoherent speech as well. After treatment, CBG 166, pt remains confused, and having difficulty following commands.  Neuro consult called by Dr. Janee Morn for this reason. Patient was not a TPA candidate secondary to presenting beyond the tPA window. She was transferred to the ICU after admission for hypoglycemia and neuro decline for further evaluation and treatment.  SUBJECTIVE Patient without complaints. Feels her language is improving. Per RN, pt not ready for transfer to the floor due to atrial fibrillation.   OBJECTIVE Most recent Vital Signs: Filed Vitals:   12/30/11 0500 12/30/11 0600 12/30/11 0700 12/30/11 0721  BP: 113/65 109/58 108/85   Pulse: 43 91 92   Temp:    99.5 F (37.5 C)  TempSrc:    Oral  Resp: 33 36 38   Height:      Weight:      SpO2: 81% 96% 88%    CBG (last 3)  Basename 12/30/11 0724 12/29/11 2124  12/29/11 1743  GLUCAP 93 76 125*   Intake/Output from previous day: 05/05 0701 - 05/06 0700 In: 1193 [P.O.:840; I.V.:203; IV Piggyback:150] Out: 400 [Urine:250; Chest Tube:150]  IV Fluid Intake:     . DISCONTD: sodium chloride 20 mL/hr at 12/26/11 1200  . DISCONTD: sodium chloride 20 mL/hr at 12/29/11 0224  . DISCONTD: phenylephrine (NEO-SYNEPHRINE) Adult infusion 15 mcg/min (12/25/11 0700)   MEDICATIONS    . amiodarone (NEXTERONE) IV bolus only 150 mg/100 mL  150 mg Intravenous Once  . amiodarone  200 mg Oral BID PC  . aspirin EC  81 mg Oral Daily  . docusate sodium  200 mg Oral Daily  . enoxaparin (LOVENOX) injection  40 mg Subcutaneous Q24H  . fluconazole  200 mg Oral Daily  . insulin aspart  0-24 Units Subcutaneous TID AC & HS  . metoprolol tartrate  12.5 mg Oral BID  . moving right along book   Does not apply Once  . pantoprazole  40 mg Oral QAC breakfast  . potassium chloride      . potassium chloride      . potassium chloride      . potassium chloride      . sodium chloride  3 mL Intravenous Q12H  . warfarin  2.5 mg Oral q1800  . warfarin  2.5 mg Oral ONCE-1800  . warfarin   Does not apply Once  . Warfarin - Physician Dosing Inpatient   Does not apply q1800  .  DISCONTD: acetaminophen (TYLENOL) oral liquid 160 mg/5 mL  975 mg Per Tube Q6H  . DISCONTD: acetaminophen  1,000 mg Oral Q6H  . DISCONTD: aspirin  324 mg Per Tube Daily  . DISCONTD: aspirin EC  325 mg Oral Daily  . DISCONTD: bisacodyl  10 mg Oral Daily  . DISCONTD: bisacodyl  10 mg Rectal Daily  . DISCONTD: coumadin book   Does not apply Once  . DISCONTD: docusate sodium  200 mg Oral Daily  . DISCONTD: pantoprazole  40 mg Oral Q1200  . DISCONTD: sodium chloride  10-40 mL Intracatheter Q12H  . DISCONTD: sodium chloride  3 mL Intravenous Q12H   PRN:  sodium chloride, bisacodyl, bisacodyl, metoprolol, ondansetron (ZOFRAN) IV, ondansetron, potassium chloride, potassium chloride, sodium chloride, traMADol,  DISCONTD: metoprolol, DISCONTD: morphine, DISCONTD: ondansetron (ZOFRAN) IV, DISCONTD: oxyCODONE, DISCONTD: sodium chloride, DISCONTD: sodium chloride  Diet:  Cardiac Activity:   ambulate DVT Prophylaxis:  Lovenox 40 mg sq daily   CLINICALLY SIGNIFICANT STUDIES CBC    Component Value Date/Time   WBC 9.2 12/30/2011 0400   RBC 2.83* 12/30/2011 0400   HGB 8.5* 12/30/2011 0400   HCT 26.7* 12/30/2011 0400   PLT 360 12/30/2011 0400   MCV 94.3 12/30/2011 0400   MCH 30.0 12/30/2011 0400   MCHC 31.8 12/30/2011 0400   RDW 16.0* 12/30/2011 0400   LYMPHSABS 1.9 12/18/2011 0303   MONOABS 0.9 12/18/2011 0303   EOSABS 0.3 12/18/2011 0303   BASOSABS 0.1 12/18/2011 0303   CMP    Component Value Date/Time   NA 137 12/30/2011 0400   K 3.6 12/30/2011 0400   CL 102 12/30/2011 0400   CO2 28 12/30/2011 0400   GLUCOSE 98 12/30/2011 0400   BUN 14 12/30/2011 0400   CREATININE 0.62 12/30/2011 0400   CALCIUM 8.9 12/30/2011 0400   PROT 6.5 12/20/2011 1818   ALBUMIN 2.4* 12/20/2011 1818   AST 32 12/20/2011 1818   ALT 30 12/20/2011 1818   ALKPHOS 54 12/20/2011 1818   BILITOT 0.2* 12/20/2011 1818   GFRNONAA >90 12/30/2011 0400   GFRAA >90 12/30/2011 0400   COAGS Lab Results  Component Value Date   INR 1.03 12/30/2011   INR 1.43 12/24/2011   INR 1.05 12/20/2011   Lipid Panel    Component Value Date/Time   CHOL 134 12/16/2011 0311   TRIG 90 12/16/2011 0311   HDL 45 12/16/2011 0311   CHOLHDL 3.0 12/16/2011 0311   VLDL 18 12/16/2011 0311   LDLCALC 71 12/16/2011 0311   HgbA1C  Lab Results  Component Value Date   HGBA1C 5.1 12/20/2011   Cardiac Panel (last 3 results)  No results found for this basename: CKTOTAL:3,CKMB:3,TROPONINI:3,RELINDX:3 in the last 72 hours Urinalysis    Component Value Date/Time   COLORURINE YELLOW 12/23/2011 1320   APPEARANCEUR CLOUDY* 12/23/2011 1320   LABSPEC 1.029 12/23/2011 1320   PHURINE 6.0 12/23/2011 1320   GLUCOSEU NEGATIVE 12/23/2011 1320   HGBUR NEGATIVE 12/23/2011 1320   BILIRUBINUR NEGATIVE 12/23/2011 1320     KETONESUR NEGATIVE 12/23/2011 1320   PROTEINUR NEGATIVE 12/23/2011 1320   UROBILINOGEN 0.2 12/23/2011 1320   NITRITE NEGATIVE 12/23/2011 1320   LEUKOCYTESUR MODERATE* 12/23/2011 1320   Urine Drug Screen  No results found for this basename: labopia,  cocainscrnur,  labbenz,  amphetmu,  thcu,  labbarb    Alcohol Level No results found for this basename: eth   VITAMIN B12     Status: Normal   Collection Time   12/15/11  9:00 AM  Component Value Range   Vitamin B-12 287  211 - 911 (pg/mL)  IRON AND TIBC     Status: Abnormal   Collection Time   12/15/11  9:00 AM      Component Value Range   Iron 39 (*) 42 - 135 (ug/dL)   TIBC 119 (*) 147 - 829 (ug/dL)   Saturation Ratios 17 (*) 20 - 55 (%)   UIBC 192  125 - 400 (ug/dL)  FERRITIN     Status: Normal   Collection Time   12/15/11  9:00 AM      Component Value Range   Ferritin 200  10 - 291 (ng/mL)  RETICULOCYTES     Status: Normal   Collection Time   12/15/11  9:00 AM      Component Value Range   Retic Ct Pct 2.9  0.4 - 3.1 (%)   RBC. 3.97  3.87 - 5.11 (MIL/uL)   Retic Count, Manual 115.1  19.0 - 186.0 (K/uL)  PRO B NATRIURETIC PEPTIDE     Status: Abnormal   Collection Time   12/15/11  9:00 AM      Component Value Range   Pro B Natriuretic peptide (BNP) 441.6 (*) 0 - 125 (pg/mL)   CT of the brain   12/15/2011  Developing cytotoxic edema correlates with the observed pattern of restricted diffusion on MR, representing acute infarction. This primarily involves the left basal ganglia.  Smaller areas of cortical and subcortical infarction seen on MRI in the left hemisphere are too small to be easily visible on CT.   No incipient uncal or transtentorial herniation, although minimal 1 mm left to right shift may be present.  No hemorrhagic transformation.    12/14/2011  Normal CT of the head without contrast.   CT Angio Head 12/16/2011  Acute non hemorrhagic infarct left basal ganglia, left uncus and left corona radiata.  Left internal  carotid artery is occluded.  Collateral flow to the left anterior cerebral artery and left middle cerebral artery branches.  The flow within the left middle cerebral artery branches is diminished suggesting that collateral flow is not adequate.   CT Angio Neck 12/16/2011  Abnormal appearance of the left internal carotid artery with appearance of dissection with complete occlusion at the C2 level.  Abnormal appearance of the proximal left common carotid artery. Question if this is related to artifact versus dissection versus atherosclerotic type changes.  Mild narrowing proximal right common carotid artery.  Prominent ectasia of the proximal right common carotid artery with fold and mild narrowing.  Mild irregularity at the right carotid bifurcation without measurable stenosis.  Mild to moderate narrowing proximal right vertebral artery.  Mild irregularity throughout the course of the right vertebral artery.  Left vertebral artery arises directly from the aortic arch. Mild narrowing proximal left vertebral artery.  Evaluation of portions of the proximal left vertebral artery is limited by artifact.  Nonspecific parenchymal changes lung apices greater on the right. Findings can be evaluated with a formal chest CT.   MRI of the brain  12/14/2011  Moderate sized acute left hemisphere infarct which appears to be secondary to an acute left internal carotid artery occlusion. No visible hemorrhage or midline shift.  Emboli to the left middle cerebral artery and its branches is not excluded.  MRA of the brain  See CT angio  2D Echocardiogram  EF 60-65% with no source of embolus.  Left atrial mass attached to intra-atrial septum highly suggestive of myxoma.  Carotid  Doppler  Right: no ICA stenosis. Left: ICA appears occluded. Bilateral vertebral artery flow is antegrade.  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  CXR  12/30/2011 1. Interval removal of  right chest tube without pneumothorax. 2. Low lung volumes with bibasilar dependent air space disease 12/29/2011 No significant change in small bilateral pleural effusions, pulmonary vascular cephalization, and bibasilar atelectasis versus infiltrate. 12/28/2011 1. Removal of the lower right-sided chest tube. 2. Persistent edema, atelectasis and effusions. 12/27/2011 1. No definite pneumothorax with two right chest tubes in place. 2. Low lung volumes with bilateral air space disease, a portion of which is due to fibrosis. 3. Difficult to exclude left pleural effusion. 12/26/2011 Low lung volumes with associated bibasilar volume loss. Little interval change in the overall pulmonary vascular congestion.  12/25/2011  1. Support apparatus, as above. 2. Low lung volumes with worsening bibasilar aeration likely reflect increasing atelectasis with superimposed small bilateral pleural effusions. 3. There may be some mild interstitial pulmonary edema as well. 12/24/2011 Bibasilar atelectasis, greater on the right. Question minimal perihilar edema. Tip of endotracheal tube is 3 mm from carina directed into the right mainstem bronchus, recommend withdrawal 2 cm. Recommend clinical confirmation of the position of the left jugular catheter as above. 12/20/2011 1. Improvement in pulmonary edema. 2. Cardiac enlargement, low lung volumes and bibasilar atelectasis.  12/18/2011 Some increase in airspace disease likely due to progressive edema. 12/16/2011  Appearance of chest is similar to the recent prior study, again suggesting a background of mild interstitial pulmonary edema. Overall, aeration has slightly improved, with decreasing small bilateral pleural effusions. 12/14/2011   Cardiomegaly and bilateral airspace disease most consistent with pulmonary edema.     CT chest 12/17/2011 Respiratory motion degraded examination with poor inspiration. Scattered pulmonary parenchymal changes some which appears to represent  scarring/interstitial fibrosis. Right upper lobe focal bronchiectasis. Scattered regions of mild consolidation may represent scarring although difficult to exclude superimposed infection  CT Abdomen and Pelvis 12/17/2011 On this unenhanced examination, no definitive pancreatic masses identified however, evaluation without contrast is markedly limited. For detection of a neuroendocrine tumor, preferable examination is contrast enhanced MR. If the patient cannot hold her breath, CT with contrast with pancreatic protocol can be obtained. Very small amount of free fluid. Etiology indeterminate.  Cardiac Cath 12/18/2011  1. Normal coronary arteries. 2. Normal abdominal aorta, iliac and femoral arteries. 3. Left atrial myxoma identified by neovascularization during right coronary angiography. 4. Left ventriculography was not performed.  MRI Abdomen 12/22/2011 1. Diminished exam detail due to respiratory motion artifact. 2. No pancreatic mass identified. 3. Indeterminant T2 intense structure within the right hepatic lobe. Not imaged on the T1-weighted images were the postcontrast images. Because of the patient's difficulty with breath-hold I would advise a three-phase liver imaging with contrast enhanced CT of the abdomen for more definitive assessment.  EKG  normal sinus rhythm.   Physical Exam   Pleasant middle-age [place] American lady currently not in distress.Awake alert. Afebrile. Head is nontraumatic. Neck is supple without bruit. Hearing is normal. Cardiac exam no murmur or gallop. Lungs are clear to auscultation. Distal pulses are well felt.  Neurological Exam ; awake alert mild expressive aphasia with word finding difficulties and paraphasias.verbal perseveration. Good comprehension  and repitition but poor naming.. Able to follow commands well. Extraocular movements are full range without nystagmus. No visual field defect to bedside confrontational testing. Mild right lower facial weakness. Tongue is  midline. Motor system exam reveals mild right hemiparesis with RUE 3/5 weaknessand moderate right grip and intrinsic hand muscles. Good right lower extremity strength 4/5  without drift. The minimum right hip flexion and ankle dorsiflexion weakness. Sensation slightly diminished on the right compared to the left. Coordination is slow but accurate. Gait was not tested.  ASSESSMENT Courtney Faulkner is a 61 y.o. 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 Status post successful myxoma excision by Dr. Cornelius Moras 12/24/2011. Now in SICU post op. Neuro status stable. On no antiplatelets prior to admission. Now on aspirin 325 mg orally every day for secondary stroke prevention. Patient with resultant right hemiparesis and expressive aphasia with overall improvement,   Therapies have been ordered for ambulation. 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  -left ICA occlusion suspect secondary to myxoma emboli vs dissection-unclear as dental artifacts interfere with images. will check repeat CT angio in 2-3 weeks with chin extension to avoid dental artifacts as in case of myxomatous occlusion, may need tumor removal by vascular surgery -tachycardia -hypoglycemia, recurrent on admission. Per IM - elevated C-peptide, elevated proinsulin, normal insulin levels. Differential includes insulinoma vs insulin autoimmune hypoglycemia vs infectious etiology (with myxoma as possible source). CT and MRI abd showed no lesions. GI recommend EUS for insulinoma, per CVTS not stable for procedure for several months post-op. monitoring CBGs -elevated cardiac enzymes on admission, cardiac related.  -short PR -obesity, class 2, Body mass index is 37.84 kg/(m^2). -hypotension -leukocytosis  Hospital day # 16  TREATMENT/PLAN -coumadin and amiodarone for atrial  fibrillation  -transfer to floor when ok with card -IP Rehab planned at discharge -as late in the hospital/rehab stay as possible, we need to do a CTA with chin up to remove dental artifact in order to determine ICA dissection vs tumor. Please contact Dr. Pearlean Brownie once patient is approaching discharge from rehab. Dr. Pearlean Brownie has discussed case with Dr. Constance Goltz. When ordering CTA, add text:  "with chin up" and "if question, call Dr. Constance Goltz"  Annie Main, AVNP, ANP-BC, GNP-BC [facility] Pager: [phone] 12/30/2011 8:17 AM  Dr. Delia Heady, Stroke Center Medical Director, has personally reviewed chart, pertinent data, examined the patient and developed the plan of care. Pager:  [phone]

## 2011-12-30 NOTE — Progress Notes (Signed)
UR Completed.  Courtney Faulkner [name] [phone] 12/30/2011  

## 2011-12-30 NOTE — Progress Notes (Signed)
Cancelled 2 view and ordered PC XR d/t pt not able to stand on her own for d/t residual from CVA and pt HR has been irregular and elevated throughout the night.

## 2011-12-30 NOTE — Progress Notes (Signed)
Patient Name: Courtney Faulkner Date of Encounter: 12/30/2011    SUBJECTIVE: Improved rest at night. Minimal pain. Having palpitations.  TELEMETRY:   A fib with RVR: Filed Vitals:   12/30/11 0700 12/30/11 0721 12/30/11 0800 12/30/11 0900  BP: 108/85  108/65 120/75  Pulse: 92  94 98  Temp:  99.5 F (37.5 C)    TempSrc:  Oral    Resp: 38  36 34  Height:      Weight:      SpO2: 88%  99% 99%    Intake/Output Summary (Last 24 hours) at 12/30/11 0917 Last data filed at 12/30/11 0825  Gross per 24 hour  Intake    973 ml  Output    400 ml  Net    573 ml    LABS: Basic Metabolic Panel:  Basename 12/30/11 0400 12/29/11 0440  NA 137 136  K 3.6 3.2*  CL 102 100  CO2 28 29  GLUCOSE 98 91  BUN 14 15  CREATININE 0.62 0.65  CALCIUM 8.9 8.6  MG -- --  PHOS -- --   CBC:  Basename 12/30/11 0400 12/29/11 0440  WBC 9.2 7.8  NEUTROABS -- --  HGB 8.5* 8.3*  HCT 26.7* 25.7*  MCV 94.3 94.8  PLT 360 276   Radiology/Studies:  No new  Physical Exam: Blood pressure 120/75, pulse 98, temperature 99.5 F (37.5 C), temperature source Oral, resp. rate 34, height 5\' 3"  (1.6 m), weight 96.9 kg (213 lb 10 oz), SpO2 99.00%. Weight change: 0 kg (0 lb)   No rub or gallop.  ASSESSMENT:  1. A fib with RVR  2. Hypokalemia   Plan:  1. IV amio boluses  2. Keep potassium > 4.0  Signed, Lesleigh Noe 12/30/2011, 9:17 AM

## 2011-12-30 NOTE — Progress Notes (Signed)
TCTS BRIEF SICU PROGRESS NOTE  6 Days Post-Op  S/P Procedure(s) (LRB): MINIMALLY INVASIVE EXCISION OF ATRIAL MYXOMA (N/A)   Still with baseline sinus tach and bursts of Afib with increased rate  Plan: Increase metoprolol  Courtney Faulkner H 12/30/2011 6:46 PM

## 2011-12-31 DIAGNOSIS — I6330 Cerebral infarction due to thrombosis of unspecified cerebral artery: Secondary | ICD-10-CM

## 2011-12-31 LAB — GLUCOSE, CAPILLARY
Glucose-Capillary: 85 mg/dL (ref 70–99)
Glucose-Capillary: 61 mg/dL — ABNORMAL LOW (ref 70–99)
Glucose-Capillary: 88 mg/dL (ref 70–99)

## 2011-12-31 LAB — PROTIME-INR
INR: 0.96 (ref 0.00–1.49)
Prothrombin Time: 13 seconds (ref 11.6–15.2)

## 2011-12-31 LAB — BASIC METABOLIC PANEL
CO2: 28 mEq/L (ref 19–32)
Calcium: 9 mg/dL (ref 8.4–10.5)
Chloride: 100 mEq/L (ref 96–112)
Creatinine, Ser: 0.66 mg/dL (ref 0.50–1.10)
Glucose, Bld: 91 mg/dL (ref 70–99)
Sodium: 138 mEq/L (ref 135–145)

## 2011-12-31 MED ORDER — ENOXAPARIN SODIUM 40 MG/0.4ML ~~LOC~~ SOLN
40.0000 mg | SUBCUTANEOUS | Status: DC
  Administered 2011-12-31 – 2012-01-02 (×3): 40 mg via SUBCUTANEOUS
  Filled 2011-12-31 (×4): qty 0.4

## 2011-12-31 MED ORDER — PATIENT'S GUIDE TO USING COUMADIN BOOK
Freq: Once | Status: AC
  Administered 2011-12-31: 18:00:00
  Filled 2011-12-31: qty 1

## 2011-12-31 MED ORDER — SODIUM CHLORIDE 0.9 % IJ SOLN: 3.0000 mL | INTRAMUSCULAR | Status: DC | PRN

## 2011-12-31 MED ORDER — SODIUM CHLORIDE 0.9 % IV SOLN: 250.0000 mL | INTRAVENOUS | Status: DC | PRN

## 2011-12-31 MED ORDER — POTASSIUM CHLORIDE CRYS ER 20 MEQ PO TBCR
40.0000 meq | EXTENDED_RELEASE_TABLET | Freq: Every day | ORAL | Status: DC
  Administered 2012-01-01 – 2012-01-02 (×2): 40 meq via ORAL
  Filled 2011-12-31 (×3): qty 2

## 2011-12-31 MED ORDER — MOVING RIGHT ALONG BOOK
Freq: Once | Status: AC
  Administered 2011-12-31: 08:00:00
  Filled 2011-12-31: qty 1

## 2011-12-31 MED ORDER — METOPROLOL TARTRATE 100 MG PO TABS
100.0000 mg | ORAL_TABLET | Freq: Two times a day (BID) | ORAL | Status: DC
  Administered 2011-12-31 – 2012-01-02 (×5): 100 mg via ORAL
  Filled 2011-12-31 (×6): qty 1

## 2011-12-31 MED ORDER — WARFARIN SODIUM 5 MG PO TABS
5.0000 mg | ORAL_TABLET | Freq: Every day | ORAL | Status: DC
  Administered 2011-12-31 – 2012-01-01 (×2): 5 mg via ORAL
  Filled 2011-12-31 (×3): qty 1

## 2011-12-31 MED ORDER — SODIUM CHLORIDE 0.9 % IJ SOLN
3.0000 mL | Freq: Two times a day (BID) | INTRAMUSCULAR | Status: DC
  Administered 2011-12-31 (×2): 10 mL via INTRAVENOUS

## 2011-12-31 MED ORDER — POTASSIUM CHLORIDE 10 MEQ/50ML IV SOLN
10.0000 meq | INTRAVENOUS | Status: DC
  Administered 2011-12-31 (×2): 10 meq via INTRAVENOUS

## 2011-12-31 MED ORDER — POTASSIUM CHLORIDE 10 MEQ/50ML IV SOLN
10.0000 meq | INTRAVENOUS | Status: AC
  Administered 2011-12-31 (×4): 10 meq via INTRAVENOUS
  Filled 2011-12-31 (×3): qty 50

## 2011-12-31 MED ORDER — ASPIRIN 81 MG PO CHEW
CHEWABLE_TABLET | ORAL | Status: AC
  Filled 2011-12-31: qty 1

## 2011-12-31 MED ORDER — ACETAMINOPHEN 325 MG PO TABS: 650.0000 mg | ORAL_TABLET | Freq: Four times a day (QID) | ORAL | Status: DC | PRN

## 2011-12-31 MED ORDER — POTASSIUM CHLORIDE 10 MEQ/50ML IV SOLN
10.0000 meq | Freq: Once | INTRAVENOUS | Status: AC
  Administered 2011-12-31: 10 meq via INTRAVENOUS
  Filled 2011-12-31: qty 50

## 2011-12-31 NOTE — Progress Notes (Addendum)
   CARDIOTHORACIC SURGERY PROGRESS NOTE  7 Days Post-Op  S/P Procedure(s) (LRB): MINIMALLY INVASIVE EXCISION OF ATRIAL MYXOMA (N/A)  Subjective: Feels better.  Slept well last night.  No pain.  No shortness of breath.  Rhythm stable overnight.  Objective: Vital signs in last 24 hours: Temp:  [98.4 F (36.9 C)-99.4 F (37.4 C)] 98.5 F (36.9 C) (05/07 0400) Pulse Rate:  [31-107] 83  (05/07 0700) Cardiac Rhythm:  [-] Normal sinus rhythm (05/07 0700) Resp:  [27-39] 35  (05/07 0700) BP: (87-122)/(51-83) 113/60 mmHg (05/07 0700) SpO2:  [91 %-100 %] 98 % (05/07 0700) Weight:  [95.6 kg (210 lb 12.2 oz)] 95.6 kg (210 lb 12.2 oz) (05/07 0500)  Physical Exam:  Rhythm:   sinus  Breath sounds: clear  Heart sounds:  RRR without murmur  Incisions:  Clean and dry  Abdomen:  soft  Extremities:  warm   Intake/Output from previous day: 05/06 0701 - 05/07 0700 In: 670 [P.O.:360; I.V.:10; IV Piggyback:300] Out: 101 [Urine:100; Stool:1] Intake/Output this shift:    Lab Results:  Basename 12/30/11 0400 12/29/11 0440  WBC 9.2 7.8  HGB 8.5* 8.3*  HCT 26.7* 25.7*  PLT 360 276   BMET:  Basename 12/31/11 0416 12/30/11 0400  NA 138 137  K 3.4* 3.6  CL 100 102  CO2 28 28  GLUCOSE 91 98  BUN 10 14  CREATININE 0.66 0.62  CALCIUM 9.0 8.9    CBG (last 3)   Basename 12/30/11 2138 12/30/11 1728 12/30/11 1204  GLUCAP 99 103* 90   PT/INR:   Basename 12/31/11 0416  LABPROT 13.0  INR 0.96    CXR:  N/A  Assessment/Plan: S/P Procedure(s) (LRB): MINIMALLY INVASIVE EXCISION OF ATRIAL MYXOMA (N/A)  Doing well Rhythm greatly improved with increased beta blocker and amiodarone Hypokalemia persists despite supplementation Expected post op acute blood loss anemia, mild, stable Expected post op volume excess, mild, diuresing Preop hypoglycemia with question raised regarding possible insulinoma - which seems very, very unlikely - blood sugars stable and normal  postoperatively   Mobilize  Continue PT/OT  Increase coumadin dose  Continue metoprolol and change to bid dosing, continue amiodarone  Increase potassium  Transfer step down  Continue to monitor glucose for now but defer any further workup to medical team  Potentially ready for transfer to inpatient rehab 1-2 days if rhythm stable  [place] Rommel H 12/31/2011 7:50 AM

## 2011-12-31 NOTE — Progress Notes (Signed)
CBG: 61  Treatment: encouraged to eat lunch, at bedside.  Symptoms: none   Follow-up CBG: Time: 1300  CBG Result:96  Possible Reasons for Event: Comments/MD notified:    Thanh Mottern, Blanchard Kelch

## 2011-12-31 NOTE — Progress Notes (Signed)
Occupational Therapy Treatment Patient Details Name: Courtney Faulkner MRN: [id number] DOB: 03/28/1951 Today's Date: 12/31/2011 Time: 4098-1191 OT Time Calculation (min): 38 min  OT Assessment / Plan / Recommendation Comments on Treatment Session Tx today limited by pt with dizziness with activity. Blood sugar found to be 61. RN made aware and gave soda from lunch tray    Follow Up Recommendations  Inpatient Rehab    Equipment Recommendations  Defer to next venue    Frequency     Plan Discharge plan remains appropriate    Precautions / Restrictions Precautions Precautions: Fall Precaution Comments: limited RUE use secondary to myxoma removal, hemiparesis right side Restrictions Weight Bearing Restrictions: No   Pertinent Vitals/Pain Pt with no c/o pain this session. C/o dizziness with mobility. Blood glucose found to be 61. RN made aware and provided soda from lunch tray.    ADL  Upper Body Dressing: Performed;Minimal assistance Where Assessed - Upper Body Dressing: Sitting, bed Toilet Transfer: Simulated;+2 Total assistance (pt=70%) Toilet Transfer Method: Ambulating (+2 to bring chair behind and assist with lines/poles) Toileting - Hygiene: Performed;+1 Total assistance Where Assessed - Toileting Hygiene: Standing (pt unable to assist secondary to fatigue/dizziness) Ambulation Related to ADLs: +2 total A(pt=70%) with RW ambulation. Pt limited today by dizziness with standing/ambulation. ADL Comments: RN checked glucose and found to be 61 which is possible source of pt dizziness upon standing    OT Goals ADL Goals ADL Goal: Grooming - Progress: Not progressing ADL Goal: Upper Body Bathing - Progress: Not progressing ADL Goal: Toilet Transfer - Progress: Not progressing Arm Goals Arm Goal: Additional Goal #1 - Progress: Progressing toward goals  Visit Information  Last OT Received On: 12/31/11 Assistance Needed: +2 PT/OT Co-Evaluation/Treatment: Yes    Subjective Data      Prior Functioning       Cognition  Overall Cognitive Status: Difficult to assess Difficult to assess due to: Impaired communication Arousal/Alertness: Awake/alert Orientation Level: Appears intact for tasks assessed Behavior During Session: [facility] for tasks performed Current Attention Level: Selective Cognition - Other Comments: Difficult to asses due to aphasia, speech more fluent and answers appropriate although some word misuse but pt recognizes this    Mobility Bed Mobility Bed Mobility: Rolling Right;Right Sidelying to Sit;Sitting - Scoot to [place] of Bed Rolling Right: 3: Mod assist Right Sidelying to Sit: 3: Mod assist Sitting - Scoot to Edge of Bed: 3: Mod assist Details for Bed Mobility Assistance: cueing for sequence and assist to fully rotate trunk and to bring legs to EOB with assist to elevate trunk from surface. Cueing for reciprocal scooting with assist for weight shift and pelvis advancement to EOB Transfers Sit to Stand: 1: +2 Total assist;From bed;From chair/3-in-1 Sit to Stand: Patient Percentage: 60% Stand to Sit: 3: Mod assist;To chair/3-in-1;To bed Details for Transfer Assistance: Pt stood x 4 trials with cueing for use of LUE on armrest and decreased use of RUE. Assist for anterior weight shift and elevation off surface.   Exercises    Balance    End of Session OT - End of Session Equipment Utilized During Treatment: Gait belt Activity Tolerance:  (pt limited by dizziness) Patient left: in chair;with call bell/phone within reach Nurse Communication: Mobility status   Broedy Osbourne 12/31/2011, 3:27 PM

## 2011-12-31 NOTE — Progress Notes (Signed)
Patient continues to make progress. Likely admit to inpt rehab over next 1 to 2 days per attending MD> I will discuss with [employer].  [phone]

## 2011-12-31 NOTE — Progress Notes (Signed)
Reviewed chart and pt not appropriate for CR services at this time. PT can notify us if she begins walking further distances. Thx. Ethelda Chick CES, ACSM

## 2011-12-31 NOTE — Progress Notes (Signed)
Physical Therapy Treatment Patient Details Name: Courtney Faulkner MRN: [id number] DOB: Mar 31, 1951 Today's Date: 12/31/2011 Time: 9528-4132 PT Time Calculation (min): 36 min  PT Assessment / Plan / Recommendation Comments on Treatment Session  Pt s/p L CVA and minimally invasive myxoma removal who is limited by fatigue and dizziness. After mobility attempts tech checked CBG to find it at 61. BP in sitting after ambulation awas 117/73 and 131/79 in standing. HEP deferred by pt due to fatigue and arrival of lunch with need to elevate CBG. Pt  continues to benefit from therapy and recommend BSC use rather than bedpan if pt able to notify nursing.    Follow Up Recommendations       Equipment Recommendations       Frequency     Plan Discharge plan remains appropriate;Frequency remains appropriate    Precautions / Restrictions Precautions Precautions: Fall Precaution Comments: limited RUE use secondary to myxoma removal, hemiparesis right side Restrictions Weight Bearing Restrictions:  (right sided weakness)   Pertinent Vitals/Pain Dizziness in standing no orthostasis    Mobility  Bed Mobility Bed Mobility: Rolling Right;Right Sidelying to Sit;Sitting - Scoot to [place] of Bed Rolling Right: 3: Mod assist Right Sidelying to Sit: 3: Mod assist Sitting - Scoot to Edge of Bed: 3: Mod assist Details for Bed Mobility Assistance: cueing for sequence and assist to fully rotate trunk and to bring legs to EOB with assist to elevate trunk from surface. Cueing for reciprocal scooting with assist for weight shift and pelvis advancement to EOB Transfers Transfers: Sit to Stand;Stand to Sit Sit to Stand: 1: +2 Total assist;From bed;From chair/3-in-1 Sit to Stand: Patient Percentage: 60% Stand to Sit: 3: Mod assist;To chair/3-in-1;To bed Details for Transfer Assistance: Pt stood x 4 trials with cueing for use of LUE on armrest and decreased use of RUE. Assist for anterior weight shift and elevation off  surface. Ambulation/Gait Ambulation/Gait Assistance: 1: +2 Total assist Ambulation/Gait: Patient Percentage: 70 Ambulation Distance (Feet): 10 Feet Assistive device: Rolling walker Ambulation/Gait Assistance Details: Cueing for grasp of RW with RUE, posture and achieving weight shift with assist for direction of RW. Chair pulled behind due to pt quick to fatigue. Pt ambulated short distance then stated dizziness and sat. Second trial pt able to stand and hold for 1 min for BP check but unable to ambulate further due to dizziness. After positioning pt in chair period of incontinence and again assisted to stand grossly with mod assist for standing balance holding PT while OT assisted with pericare. Gait Pattern: Step-to pattern;Decreased step length - right;Decreased step length - left Stairs: No    Exercises     PT Goals Acute Rehab PT Goals Pt will go Supine/Side to Sit: with min assist PT Goal: Supine/Side to Sit - Progress: Revised due to lack of progress PT Goal: Sit at Edge Of Bed - Progress: Progressing toward goal Pt will go Sit to Supine/Side: with min assist PT Goal: Sit to Supine/Side - Progress: Revised due to lack of progress PT Goal: Sit to Stand - Progress: Progressing toward goal PT Goal: Stand to Sit - Progress: Progressing toward goal Pt will Transfer Bed to Chair/Chair to Bed: with mod assist PT Transfer Goal: Bed to Chair/Chair to Bed - Progress: Revised due to lack of progress Pt will Ambulate: 16 - 50 feet;with min assist;with least restrictive assistive device PT Goal: Ambulate - Progress: Revised due to lack of progress  Visit Information  Last PT Received On: 12/31/11 Assistance Needed: +  2    Subjective Data  Subjective: I'm just done   Cognition  Overall Cognitive Status: Difficult to assess Difficult to assess due to: Impaired communication Arousal/Alertness: Awake/alert Orientation Level: Appears intact for tasks assessed Behavior During Session:  [facility] for tasks performed Cognition - Other Comments: Difficult to asses due to aphasia, speech more fluent and answers appropriate although some word misuse but pt recognizes    Balance     End of Session PT - End of Session Equipment Utilized During Treatment: Gait belt Activity Tolerance: Patient limited by fatigue;Other (comment) (pt reported dizziness with activity) Patient left: in chair;with call bell/phone within reach Nurse Communication: Mobility status    Delorse Lek 12/31/2011, 12:03 PM Delaney Meigs, PT [phone]

## 2011-12-31 NOTE — Progress Notes (Signed)
Stroke Team Progress Note  HISTORY Courtney Faulkner is an 61 y.o. female with dysarthria, aphasia and right sided weakness. According to son at bedside, his father heard her vaccuming at ~08:30 on 12/14/2011. He came down stairs at 11:30 and found her confused and dysarthric. EMS called and found BS 36. Patient was subsequently brought to the ED. She arrive at 1214. In the ED patient was given some orange juice and her blood CBG went to 166. Patient subsequently had a head CT done which was negative. Initially a code stroke was called however it was canceled secondary to patient's hypoglycemic spells. Per family patient was recently been treated for upper respiratory symptoms with prednisone and albuterol. Patient and family deny any recent fevers, no chills, no chest pain, no diarrhea, no constipation, no dysuria, no generalized weakness. Patient's family does endorse some shortness of breath on exertion when patient climbs up stairs which has been ongoing for the past 2 weeks. On exam and during the interview patient has some dysarthric speech is alert to self only and has an incoherent speech as well. After treatment, CBG 166, pt remains confused, and having difficulty following commands.  Neuro consult called by Dr. Janee Morn for this reason. Patient was not a TPA candidate secondary to presenting beyond the tPA window. She was transferred to the ICU after admission for hypoglycemia and neuro decline for further evaluation and treatment.  SUBJECTIVE Patient without complaints. Feels her language is improving. Patient is in rapid atrial fibrillation.   OBJECTIVE Most recent Vital Signs: Filed Vitals:   12/31/11 0700 12/31/11 0754 12/31/11 0800 12/31/11 0900  BP: 113/60  104/59   Pulse: 83  85 94  Temp:  98.7 F (37.1 C)    TempSrc:  Oral    Resp: 35  38 31  Height:      Weight:      SpO2: 98%  99% 97%   CBG (last 3)   Basename 12/31/11 0752 12/30/11 2138 12/30/11 1728  GLUCAP 85 99 103*    Intake/Output from previous day: 05/06 0701 - 05/07 0700 In: 670 [P.O.:360; I.V.:10; IV Piggyback:300] Out: 101 [Urine:100; Stool:1]  IV Fluid Intake:    MEDICATIONS     . amiodarone (NEXTERONE) IV bolus only 150 mg/100 mL  150 mg Intravenous Once  . amiodarone  400 mg Oral BID PC  . aspirin      . aspirin EC  81 mg Oral Daily  . docusate sodium  200 mg Oral Daily  . enoxaparin (LOVENOX) injection  40 mg Subcutaneous Q24H  . fluconazole  200 mg Oral Daily  . furosemide  40 mg Oral Daily  . insulin aspart  0-24 Units Subcutaneous TID AC & HS  . metoprolol tartrate  100 mg Oral BID  . metoprolol tartrate  25 mg Oral Once  . moving right along book   Does not apply Once  . pantoprazole  40 mg Oral QAC breakfast  . potassium chloride  10 mEq Intravenous Q1 Hr x 3  . potassium chloride  10 mEq Intravenous Q1 Hr x 4  . potassium chloride  40 mEq Oral Daily  . sodium chloride  3 mL Intravenous Q12H  . warfarin  5 mg Oral q1800  . warfarin   Does not apply Once  . Warfarin - Physician Dosing Inpatient   Does not apply q1800  . DISCONTD: enoxaparin (LOVENOX) injection  40 mg Subcutaneous Q24H  . DISCONTD: metoprolol tartrate  50 mg Oral Q8H  .  DISCONTD: metoprolol tartrate  25 mg Oral BID  . DISCONTD: potassium chloride  10 mEq Intravenous Q1 Hr x 3  . DISCONTD: sodium chloride  3 mL Intravenous Q12H  . DISCONTD: warfarin  2.5 mg Oral q1800   PRN:  sodium chloride, acetaminophen, bisacodyl, bisacodyl, ondansetron (ZOFRAN) IV, ondansetron, sodium chloride, traMADol, DISCONTD: sodium chloride, DISCONTD: metoprolol, DISCONTD: potassium chloride, DISCONTD: sodium chloride  Diet:  Cardiac Activity:   ambulate DVT Prophylaxis:  Lovenox 40 mg sq daily   CLINICALLY SIGNIFICANT STUDIES CBC    Component Value Date/Time   WBC 9.2 12/30/2011 0400   RBC 2.83* 12/30/2011 0400   HGB 8.5* 12/30/2011 0400   HCT 26.7* 12/30/2011 0400   PLT 360 12/30/2011 0400   MCV 94.3 12/30/2011 0400   MCH 30.0  12/30/2011 0400   MCHC 31.8 12/30/2011 0400   RDW 16.0* 12/30/2011 0400   LYMPHSABS 1.9 12/18/2011 0303   MONOABS 0.9 12/18/2011 0303   EOSABS 0.3 12/18/2011 0303   BASOSABS 0.1 12/18/2011 0303   CMP    Component Value Date/Time   NA 138 12/31/2011 0416   K 3.4* 12/31/2011 0416   CL 100 12/31/2011 0416   CO2 28 12/31/2011 0416   GLUCOSE 91 12/31/2011 0416   BUN 10 12/31/2011 0416   CREATININE 0.66 12/31/2011 0416   CALCIUM 9.0 12/31/2011 0416   PROT 6.5 12/20/2011 1818   ALBUMIN 2.4* 12/20/2011 1818   AST 32 12/20/2011 1818   ALT 30 12/20/2011 1818   ALKPHOS 54 12/20/2011 1818   BILITOT 0.2* 12/20/2011 1818   GFRNONAA >90 12/31/2011 0416   GFRAA >90 12/31/2011 0416   COAGS Lab Results  Component Value Date   INR 0.96 12/31/2011   INR 1.03 12/30/2011   INR 1.43 12/24/2011   Lipid Panel    Component Value Date/Time   CHOL 134 12/16/2011 0311   TRIG 90 12/16/2011 0311   HDL 45 12/16/2011 0311   CHOLHDL 3.0 12/16/2011 0311   VLDL 18 12/16/2011 0311   LDLCALC 71 12/16/2011 0311   HgbA1C  Lab Results  Component Value Date   HGBA1C 5.1 12/20/2011   Urinalysis    Component Value Date/Time   COLORURINE YELLOW 12/23/2011 1320   APPEARANCEUR CLOUDY* 12/23/2011 1320   LABSPEC 1.029 12/23/2011 1320   PHURINE 6.0 12/23/2011 1320   GLUCOSEU NEGATIVE 12/23/2011 1320   HGBUR NEGATIVE 12/23/2011 1320   BILIRUBINUR NEGATIVE 12/23/2011 1320   KETONESUR NEGATIVE 12/23/2011 1320   PROTEINUR NEGATIVE 12/23/2011 1320   UROBILINOGEN 0.2 12/23/2011 1320   NITRITE NEGATIVE 12/23/2011 1320   LEUKOCYTESUR MODERATE* 12/23/2011 1320    VITAMIN B12     Status: Normal   Collection Time   12/15/11  9:00 AM      Component Value Range   Vitamin B-12 287  211 - 911 (pg/mL)  IRON AND TIBC     Status: Abnormal   Collection Time   12/15/11  9:00 AM      Component Value Range   Iron 39 (*) 42 - 135 (ug/dL)   TIBC 161 (*) 096 - 045 (ug/dL)   Saturation Ratios 17 (*) 20 - 55 (%)   UIBC 192  125 - 400 (ug/dL)  FERRITIN     Status: Normal    Collection Time   12/15/11  9:00 AM      Component Value Range   Ferritin 200  10 - 291 (ng/mL)  RETICULOCYTES     Status: Normal   Collection Time   12/15/11  9:00 AM      Component Value Range   Retic Ct Pct 2.9  0.4 - 3.1 (%)   RBC. 3.97  3.87 - 5.11 (MIL/uL)   Retic Count, Manual 115.1  19.0 - 186.0 (K/uL)  PRO B NATRIURETIC PEPTIDE     Status: Abnormal   Collection Time   12/15/11  9:00 AM      Component Value Range   Pro B Natriuretic peptide (BNP) 441.6 (*) 0 - 125 (pg/mL)   CT of the brain   12/15/2011  Developing cytotoxic edema correlates with the observed pattern of restricted diffusion on MR, representing acute infarction. This primarily involves the left basal ganglia.  Smaller areas of cortical and subcortical infarction seen on MRI in the left hemisphere are too small to be easily visible on CT.   No incipient uncal or transtentorial herniation, although minimal 1 mm left to right shift may be present.  No hemorrhagic transformation.    12/14/2011  Normal CT of the head without contrast.   CT Angio Head 12/16/2011  Acute non hemorrhagic infarct left basal ganglia, left uncus and left corona radiata.  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  Collateral flow to the left anterior cerebral artery and left middle cerebral artery branches.  The flow within the left middle cerebral artery branches is diminished suggesting that collateral flow is not adequate.   CT Angio Neck 12/16/2011  Abnormal appearance of the left internal carotid artery with appearance of dissection with complete occlusion at the C2 level.  Abnormal appearance of the proximal left common carotid artery. Question if this is related to artifact versus dissection versus atherosclerotic type changes.  Mild narrowing proximal right common carotid artery.  Prominent ectasia of the proximal right common carotid artery with fold and mild narrowing.  Mild irregularity at the right carotid bifurcation without measurable  stenosis.  Mild to moderate narrowing proximal right vertebral artery.  Mild irregularity throughout the course of the right vertebral artery.  Left vertebral artery arises directly from the aortic arch. Mild narrowing proximal left vertebral artery.  Evaluation of portions of the proximal left vertebral artery is limited by artifact.  Nonspecific parenchymal changes lung apices greater on the right. Findings can be evaluated with a formal chest CT.   MRI of the brain  12/14/2011  Moderate sized acute left hemisphere infarct which appears to be secondary to an acute left internal carotid artery occlusion. No visible hemorrhage or midline shift.  Emboli to the left middle cerebral artery and its branches is not excluded.  MRA of the brain  See CT angio  2D Echocardiogram  EF 60-65% with no source of embolus.  Left atrial mass attached to intra-atrial septum highly suggestive of myxoma.  Carotid Doppler  Right: no ICA stenosis. Left: ICA appears occluded. Bilateral vertebral artery flow is antegrade.  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  CXR  12/30/2011 1. Interval removal of right chest tube without pneumothorax. 2. Low lung volumes with bibasilar dependent air space disease 12/29/2011 No significant change in small bilateral pleural effusions, pulmonary vascular cephalization, and bibasilar atelectasis versus infiltrate. 12/28/2011 1. Removal of the lower right-sided chest tube. 2. Persistent edema, atelectasis and effusions. 12/27/2011 1. No definite pneumothorax with two right chest tubes in place. 2. Low lung volumes with bilateral air space disease, a portion of which is due to fibrosis. 3. Difficult to exclude left pleural effusion. 12/26/2011  Low lung volumes with associated bibasilar volume loss. Little interval change in the overall pulmonary vascular congestion.  12/25/2011  1. Support apparatus, as above. 2. Low lung volumes with  worsening bibasilar aeration likely reflect increasing atelectasis with superimposed small bilateral pleural effusions. 3. There may be some mild interstitial pulmonary edema as well. 12/24/2011 Bibasilar atelectasis, greater on the right. Question minimal perihilar edema. Tip of endotracheal tube is 3 mm from carina directed into the right mainstem bronchus, recommend withdrawal 2 cm. Recommend clinical confirmation of the position of the left jugular catheter as above. 12/20/2011 1. Improvement in pulmonary edema. 2. Cardiac enlargement, low lung volumes and bibasilar atelectasis.  12/18/2011 Some increase in airspace disease likely due to progressive edema. 12/16/2011  Appearance of chest is similar to the recent prior study, again suggesting a background of mild interstitial pulmonary edema. Overall, aeration has slightly improved, with decreasing small bilateral pleural effusions. 12/14/2011   Cardiomegaly and bilateral airspace disease most consistent with pulmonary edema.     CT chest 12/17/2011 Respiratory motion degraded examination with poor inspiration. Scattered pulmonary parenchymal changes some which appears to represent scarring/interstitial fibrosis. Right upper lobe focal bronchiectasis. Scattered regions of mild consolidation may represent scarring although difficult to exclude superimposed infection  CT Abdomen and Pelvis 12/17/2011 On this unenhanced examination, no definitive pancreatic masses identified however, evaluation without contrast is markedly limited. For detection of a neuroendocrine tumor, preferable examination is contrast enhanced MR. If the patient cannot hold her breath, CT with contrast with pancreatic protocol can be obtained. Very small amount of free fluid. Etiology indeterminate.  Cardiac Cath 12/18/2011  1. Normal coronary arteries. 2. Normal abdominal aorta, iliac and femoral arteries. 3. Left atrial myxoma identified by neovascularization during right coronary  angiography. 4. Left ventriculography was not performed.  MRI Abdomen 12/22/2011 1. Diminished exam detail due to respiratory motion artifact. 2. No pancreatic mass identified. 3. Indeterminant T2 intense structure within the right hepatic lobe. Not imaged on the T1-weighted images were the postcontrast images. Because of the patient's difficulty with breath-hold I would advise a three-phase liver imaging with contrast enhanced CT of the abdomen for more definitive assessment.  EKG  normal sinus rhythm.   Physical Exam   Pleasant middle-age [place] American lady currently not in distress.Awake alert. Afebrile. Head is nontraumatic. Neck is supple without bruit. Hearing is normal. Cardiac exam no murmur or gallop. Lungs are clear to auscultation. Distal pulses are well felt.  Neurological Exam ; awake alert mild expressive aphasia with word finding difficulties and paraphasias.verbal perseveration. Good comprehension  and repitition but poor naming. Able to follow commands well. Extraocular movements are full range without nystagmus. No visual field defect to bedside confrontational testing. Mild right lower facial weakness. Tongue is midline. Motor system exam reveals mild right hemiparesis with RUE 3/5 weaknessand moderate right grip and intrinsic hand muscles. Good right lower extremity strength 4/5  without drift. The minimum right hip flexion and ankle dorsiflexion weakness. Sensation slightly diminished on the right compared to the left. Coordination is slow but accurate. Gait was not tested.  ASSESSMENT Courtney Faulkner is a 61 y.o. 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 Status post successful myxoma excision by Dr. Cornelius Moras 12/24/2011. Now in SICU post op. Neuro status stable. On no antiplatelets prior to admission. Now on aspirin 325 mg orally every day for secondary stroke prevention. Patient with resultant right  hemiparesis and expressive aphasia with overall improvement,  Therapies have been ordered for ambulation. 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  -left ICA occlusion suspect secondary to myxoma emboli vs dissection-unclear as dental artifacts interfere with images. will check repeat CT angio in 2-3 weeks with chin extension to avoid dental artifacts as in case of myxomatous occlusion, may need tumor removal by vascular surgery -tachycardia -hypoglycemia, recurrent on admission. Per IM - elevated C-peptide, elevated proinsulin, normal insulin levels. Differential includes insulinoma vs insulin autoimmune hypoglycemia vs infectious etiology (with myxoma as possible source). CT and MRI abd showed no lesions. GI recommend EUS for insulinoma, per CVTS not stable for procedure for several months post-op. monitoring CBGs -elevated cardiac enzymes on admission, cardiac related.  -short PR -obesity, class 2, Body mass index is 37.33 kg/(m^2). -hypotension -leukocytosis  Hospital day # 17  TREATMENT/PLAN -coumadin and amiodarone for atrial fibrillation  -transfer to floor when ok with card -IP Rehab planned at discharge -as late in the hospital/rehab stay as possible, we need to do a CTA with chin up to remove dental artifact in order to determine ICA dissection vs tumor. Please contact Dr. Pearlean Brownie once patient is approaching discharge from rehab. Dr. Pearlean Brownie has discussed case with Dr. Constance Goltz. When ordering CTA, add text:  "with chin up" and "if question, call Dr. Constance Goltz"  Annie Main, AVNP, ANP-BC, GNP-BC [facility] Pager: [phone] 12/31/2011 9:39 AM  Dr. Delia Heady, Stroke Center Medical Director, has personally reviewed chart, pertinent data, examined the patient and developed the plan of care. Pager:  [phone]

## 2011-12-31 NOTE — Progress Notes (Signed)
Speech Language Pathology Treatment Patient Details Name: BRISEYDA FEHR MRN: [id number] DOB: [birth year] Today's Date: 12/31/2011 Time: 0850-0910 SLP Time Calculation (min): 20 min  Assessment / Plan / Recommendation Clinical Impression  Pt demonstrated improved word finding during confrontationl naming tasks and in conversation with self correction in 4 out of 6 error occurences. Pt primarily demonstrated confusion impairing higher level processing and verbal expression. SLP provided max verbal cues for reasoning regarding plan of care. Pt repeated reasoning with min verbal cues needed. Awareness improving, Pt with some language of confusion and confabulation with complex tasks.     SLP Plan  Continue with current plan of care    Pertinent Vitals/Pain NA  SLP Goals  SLP Goals Potential to Achieve Goals: Good Potential Considerations: Co-morbidities Progress/Goals/Alternative treatment plan discussed with pt/caregiver and they: Agree SLP Goal #1: Pt. will demonstrate word finding techniques during explanations/descriptions with min verbal cues. SLP Goal #1 - Progress: Progressing toward goal SLP Goal #2: Pt. will initiate self-correction of errors with min visual/gestural cues. SLP Goal #2 - Progress: Progressing toward goal SLP Goal #3: Pt. will demonstrate comprehension of complex information with min verbal cues. SLP Goal #3 - Progress: Progressing toward goal  General Behavior/Cognition: Alert;Cooperative;Confused;Requires cueing;Decreased sustained attention   Treatment Treatment focused on: Aphasia;Cognition Skilled Treatment: Verbal cues, model, teach, reteach, cloze task, compensatory strategies  Harlon Ditty, MA CCC-SLP [phone]  Claudine Mouton 12/31/2011, 10:25 AM

## 2012-01-01 ENCOUNTER — Inpatient Hospital Stay (HOSPITAL_COMMUNITY): Payer: BC Managed Care – PPO

## 2012-01-01 DIAGNOSIS — I6330 Cerebral infarction due to thrombosis of unspecified cerebral artery: Secondary | ICD-10-CM

## 2012-01-01 LAB — BASIC METABOLIC PANEL
BUN: 10 mg/dL (ref 6–23)
CO2: 29 mEq/L (ref 19–32)
Calcium: 9.1 mg/dL (ref 8.4–10.5)
Chloride: 99 mEq/L (ref 96–112)
Creatinine, Ser: 0.72 mg/dL (ref 0.50–1.10)
GFR calc Af Amer: >90 mL/min (ref >90)
GFR calc non Af Amer: >90 mL/min (ref >90)
Glucose, Bld: 80 mg/dL (ref 70–99)
Potassium: 3.6 mEq/L (ref 3.5–5.1)
Sodium: 141 mEq/L (ref 135–145)

## 2012-01-01 LAB — GLUCOSE, CAPILLARY
Glucose-Capillary: 85 mg/dL (ref 70–99)
Glucose-Capillary: 90 mg/dL (ref 70–99)
Glucose-Capillary: 97 mg/dL (ref 70–99)

## 2012-01-01 LAB — CBC
HCT: 26.4 % — ABNORMAL LOW (ref 36.0–46.0)
Hemoglobin: 8.5 g/dL — ABNORMAL LOW (ref 12.0–15.0)
MCV: 95 fL (ref 78.0–100.0)
RBC: 2.78 MIL/uL — ABNORMAL LOW (ref 3.87–5.11)
WBC: 8.2 10*3/uL (ref 4.0–10.5)

## 2012-01-01 LAB — PROTIME-INR
INR: 1.33 (ref 0.00–1.49)
Prothrombin Time: 16.7 seconds — ABNORMAL HIGH (ref 11.6–15.2)

## 2012-01-01 MED ORDER — WARFARIN SODIUM 5 MG PO TABS: 5.0000 mg | ORAL_TABLET | Freq: Every day | ORAL | Status: DC

## 2012-01-01 MED ORDER — ASPIRIN 81 MG PO TBEC: 81.0000 mg | DELAYED_RELEASE_TABLET | Freq: Every day | ORAL | Status: AC

## 2012-01-01 MED ORDER — FUROSEMIDE 40 MG PO TABS
40.0000 mg | ORAL_TABLET | Freq: Two times a day (BID) | ORAL | Status: DC
  Administered 2012-01-02: 40 mg via ORAL
  Filled 2012-01-01 (×3): qty 1

## 2012-01-01 MED ORDER — TRAMADOL HCL 50 MG PO TABS: 50.0000 mg | ORAL_TABLET | ORAL | Status: DC | PRN

## 2012-01-01 MED ORDER — POTASSIUM CHLORIDE CRYS ER 20 MEQ PO TBCR: 40.0000 meq | EXTENDED_RELEASE_TABLET | Freq: Every day | ORAL | Status: DC

## 2012-01-01 MED ORDER — POTASSIUM CHLORIDE CRYS ER 20 MEQ PO TBCR
40.0000 meq | EXTENDED_RELEASE_TABLET | Freq: Once | ORAL | Status: AC
  Administered 2012-01-01: 40 meq via ORAL
  Filled 2012-01-01: qty 2

## 2012-01-01 MED ORDER — AMIODARONE HCL 400 MG PO TABS: 400.0000 mg | ORAL_TABLET | Freq: Two times a day (BID) | ORAL | Status: DC

## 2012-01-01 MED ORDER — FUROSEMIDE 40 MG PO TABS: 40.0000 mg | ORAL_TABLET | Freq: Every day | ORAL | Status: DC

## 2012-01-01 MED ORDER — METOPROLOL TARTRATE 100 MG PO TABS: 100.0000 mg | ORAL_TABLET | Freq: Two times a day (BID) | ORAL | Status: DC

## 2012-01-01 MED ORDER — FUROSEMIDE 10 MG/ML IJ SOLN
40.0000 mg | Freq: Three times a day (TID) | INTRAMUSCULAR | Status: AC
  Administered 2012-01-01 (×2): 40 mg via INTRAVENOUS
  Filled 2012-01-01 (×2): qty 4

## 2012-01-01 NOTE — Progress Notes (Signed)
Stroke Team Progress Note  HISTORY Courtney Faulkner is an 61 y.o. female with dysarthria, aphasia and right sided weakness. According to son at bedside, his father heard her vaccuming at ~08:30 on 12/14/2011. He came down stairs at 11:30 and found her confused and dysarthric. EMS called and found BS 36. Patient was subsequently brought to the ED. She arrive at 1214. In the ED patient was given some orange juice and her blood CBG went to 166. Patient subsequently had a head CT done which was negative. Initially a code stroke was called however it was canceled secondary to patient's hypoglycemic spells. Per family patient was recently been treated for upper respiratory symptoms with prednisone and albuterol. Patient and family deny any recent fevers, no chills, no chest pain, no diarrhea, no constipation, no dysuria, no generalized weakness. Patient's family does endorse some shortness of breath on exertion when patient climbs up stairs which has been ongoing for the past 2 weeks. On exam and during the interview patient has some dysarthric speech is alert to self only and has an incoherent speech as well. After treatment, CBG 166, pt remains confused, and having difficulty following commands.  Neuro consult called by Dr. Janee Morn for this reason. Patient was not a TPA candidate secondary to presenting beyond the tPA window. She was transferred to the ICU after admission for hypoglycemia and neuro decline for further evaluation and treatment.  SUBJECTIVE Patient without complaints. Feels her language is improving. Transferred to the floor yesterday.  OBJECTIVE Most recent Vital Signs: Filed Vitals:   12/31/11 2103 12/31/11 2150 01/01/12 0538 01/01/12 0615  BP: 127/82  113/71   Pulse: 99  85   Temp: 99.4 F (37.4 C)  99.3 F (37.4 C)   TempSrc: Oral  Oral   Resp: 24  19   Height:      Weight:   92.3 kg (203 lb 7.8 oz)   SpO2: 83% 92% 88% 92%   CBG (last 3)   Basename 01/01/12 0634 12/31/11 2100  12/31/11 1603  GLUCAP 85 90 88   Intake/Output from previous day: 05/07 0701 - 05/08 0700 In: 290 [P.O.:120; I.V.:20; IV Piggyback:150] Out: 450 [Urine:450]  IV Fluid Intake:    MEDICATIONS     . amiodarone  400 mg Oral BID PC  . aspirin      . aspirin EC  81 mg Oral Daily  . docusate sodium  200 mg Oral Daily  . enoxaparin (LOVENOX) injection  40 mg Subcutaneous Q24H  . fluconazole  200 mg Oral Daily  . furosemide  40 mg Oral Daily  . insulin aspart  0-24 Units Subcutaneous TID AC & HS  . metoprolol tartrate  100 mg Oral BID  . pantoprazole  40 mg Oral QAC breakfast  . patient's guide to using coumadin book   Does not apply Once  . potassium chloride  10 mEq Intravenous Q1 Hr x 4  . potassium chloride  10 mEq Intravenous Once  . potassium chloride  40 mEq Oral Daily  . sodium chloride  3 mL Intravenous Q12H  . warfarin  5 mg Oral q1800  . warfarin   Does not apply Once  . Warfarin - Physician Dosing Inpatient   Does not apply q1800   PRN:  sodium chloride, acetaminophen, bisacodyl, bisacodyl, ondansetron (ZOFRAN) IV, ondansetron, sodium chloride, traMADol  Diet:  Cardiac Activity:   ambulate DVT Prophylaxis:  Lovenox 40 mg sq daily   CLINICALLY SIGNIFICANT STUDIES CBC    Component Value  Date/Time   WBC 8.2 01/01/2012 0500   RBC 2.78* 01/01/2012 0500   HGB 8.5* 01/01/2012 0500   HCT 26.4* 01/01/2012 0500   PLT 434* 01/01/2012 0500   MCV 95.0 01/01/2012 0500   MCH 30.6 01/01/2012 0500   MCHC 32.2 01/01/2012 0500   RDW 15.8* 01/01/2012 0500   LYMPHSABS 1.9 12/18/2011 0303   MONOABS 0.9 12/18/2011 0303   EOSABS 0.3 12/18/2011 0303   BASOSABS 0.1 12/18/2011 0303   CMP    Component Value Date/Time   NA 141 01/01/2012 0500   K 3.6 01/01/2012 0500   CL 99 01/01/2012 0500   CO2 29 01/01/2012 0500   GLUCOSE 80 01/01/2012 0500   BUN 10 01/01/2012 0500   CREATININE 0.72 01/01/2012 0500   CALCIUM 9.1 01/01/2012 0500   PROT 6.5 12/20/2011 1818   ALBUMIN 2.4* 12/20/2011 1818   AST 32 12/20/2011 1818    ALT 30 12/20/2011 1818   ALKPHOS 54 12/20/2011 1818   BILITOT 0.2* 12/20/2011 1818   GFRNONAA >90 01/01/2012 0500   GFRAA >90 01/01/2012 0500   COAGS Lab Results  Component Value Date   INR 1.33 01/01/2012   INR 0.96 12/31/2011   INR 1.03 12/30/2011   Lipid Panel    Component Value Date/Time   CHOL 134 12/16/2011 0311   TRIG 90 12/16/2011 0311   HDL 45 12/16/2011 0311   CHOLHDL 3.0 12/16/2011 0311   VLDL 18 12/16/2011 0311   LDLCALC 71 12/16/2011 0311   HgbA1C  Lab Results  Component Value Date   HGBA1C 5.1 12/20/2011   Urinalysis    Component Value Date/Time   COLORURINE YELLOW 12/23/2011 1320   APPEARANCEUR CLOUDY* 12/23/2011 1320   LABSPEC 1.029 12/23/2011 1320   PHURINE 6.0 12/23/2011 1320   GLUCOSEU NEGATIVE 12/23/2011 1320   HGBUR NEGATIVE 12/23/2011 1320   BILIRUBINUR NEGATIVE 12/23/2011 1320   KETONESUR NEGATIVE 12/23/2011 1320   PROTEINUR NEGATIVE 12/23/2011 1320   UROBILINOGEN 0.2 12/23/2011 1320   NITRITE NEGATIVE 12/23/2011 1320   LEUKOCYTESUR MODERATE* 12/23/2011 1320    VITAMIN B12     Status: Normal   Collection Time   12/15/11  9:00 AM      Component Value Range   Vitamin B-12 287  211 - 911 (pg/mL)  IRON AND TIBC     Status: Abnormal   Collection Time   12/15/11  9:00 AM      Component Value Range   Iron 39 (*) 42 - 135 (ug/dL)   TIBC 161 (*) 096 - 045 (ug/dL)   Saturation Ratios 17 (*) 20 - 55 (%)   UIBC 192  125 - 400 (ug/dL)  FERRITIN     Status: Normal   Collection Time   12/15/11  9:00 AM      Component Value Range   Ferritin 200  10 - 291 (ng/mL)  RETICULOCYTES     Status: Normal   Collection Time   12/15/11  9:00 AM      Component Value Range   Retic Ct Pct 2.9  0.4 - 3.1 (%)   RBC. 3.97  3.87 - 5.11 (MIL/uL)   Retic Count, Manual 115.1  19.0 - 186.0 (K/uL)  PRO B NATRIURETIC PEPTIDE     Status: Abnormal   Collection Time   12/15/11  9:00 AM      Component Value Range   Pro B Natriuretic peptide (BNP) 441.6 (*) 0 - 125 (pg/mL)   CT of the brain     12/15/2011  Developing cytotoxic edema correlates with the observed pattern of restricted diffusion on MR, representing acute infarction. This primarily involves the left basal ganglia.  Smaller areas of cortical and subcortical infarction seen on MRI in the left hemisphere are too small to be easily visible on CT.   No incipient uncal or transtentorial herniation, although minimal 1 mm left to right shift may be present.  No hemorrhagic transformation.    12/14/2011  Normal CT of the head without contrast.   CT Angio Head 12/16/2011  Acute non hemorrhagic infarct left basal ganglia, left uncus and left corona radiata.  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  Collateral flow to the left anterior cerebral artery and left middle cerebral artery branches.  The flow within the left middle cerebral artery branches is diminished suggesting that collateral flow is not adequate.   CT Angio Neck 12/16/2011  Abnormal appearance of the left internal carotid artery with appearance of dissection with complete occlusion at the C2 level.  Abnormal appearance of the proximal left common carotid artery. Question if this is related to artifact versus dissection versus atherosclerotic type changes.  Mild narrowing proximal right common carotid artery.  Prominent ectasia of the proximal right common carotid artery with fold and mild narrowing.  Mild irregularity at the right carotid bifurcation without measurable stenosis.  Mild to moderate narrowing proximal right vertebral artery.  Mild irregularity throughout the course of the right vertebral artery.  Left vertebral artery arises directly from the aortic arch. Mild narrowing proximal left vertebral artery.  Evaluation of portions of the proximal left vertebral artery is limited by artifact.  Nonspecific parenchymal changes lung apices greater on the right. Findings can be evaluated with a formal chest CT.   MRI of the brain  12/14/2011  Moderate sized acute left hemisphere  infarct which appears to be secondary to an acute left internal carotid artery occlusion. No visible hemorrhage or midline shift.  Emboli to the left middle cerebral artery and its branches is not excluded.  MRA of the brain  See CT angio  2D Echocardiogram  EF 60-65% with no source of embolus.  Left atrial mass attached to intra-atrial septum highly suggestive of myxoma.  Carotid Doppler  Right: no ICA stenosis. Left: ICA appears occluded. Bilateral vertebral artery flow is antegrade.  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  CXR  12/30/2011 1. Interval removal of right chest tube without pneumothorax. 2. Low lung volumes with bibasilar dependent air space disease 12/29/2011 No significant change in small bilateral pleural effusions, pulmonary vascular cephalization, and bibasilar atelectasis versus infiltrate. 12/28/2011 1. Removal of the lower right-sided chest tube. 2. Persistent edema, atelectasis and effusions. 12/27/2011 1. No definite pneumothorax with two right chest tubes in place. 2. Low lung volumes with bilateral air space disease, a portion of which is due to fibrosis. 3. Difficult to exclude left pleural effusion. 12/26/2011 Low lung volumes with associated bibasilar volume loss. Little interval change in the overall pulmonary vascular congestion.  12/25/2011  1. Support apparatus, as above. 2. Low lung volumes with worsening bibasilar aeration likely reflect increasing atelectasis with superimposed small bilateral pleural effusions. 3. There may be some mild interstitial pulmonary edema as well. 12/24/2011 Bibasilar atelectasis, greater on the right. Question minimal perihilar edema. Tip of endotracheal tube is 3 mm from carina directed into the right mainstem bronchus, recommend withdrawal 2 cm. Recommend clinical confirmation of the position of the left  jugular catheter as above. 12/20/2011 1. Improvement in pulmonary edema. 2.  Cardiac enlargement, low lung volumes and bibasilar atelectasis.  12/18/2011 Some increase in airspace disease likely due to progressive edema. 12/16/2011  Appearance of chest is similar to the recent prior study, again suggesting a background of mild interstitial pulmonary edema. Overall, aeration has slightly improved, with decreasing small bilateral pleural effusions. 12/14/2011   Cardiomegaly and bilateral airspace disease most consistent with pulmonary edema.     CT chest 12/17/2011 Respiratory motion degraded examination with poor inspiration. Scattered pulmonary parenchymal changes some which appears to represent scarring/interstitial fibrosis. Right upper lobe focal bronchiectasis. Scattered regions of mild consolidation may represent scarring although difficult to exclude superimposed infection  CT Abdomen and Pelvis 12/17/2011 On this unenhanced examination, no definitive pancreatic masses identified however, evaluation without contrast is markedly limited. For detection of a neuroendocrine tumor, preferable examination is contrast enhanced MR. If the patient cannot hold her breath, CT with contrast with pancreatic protocol can be obtained. Very small amount of free fluid. Etiology indeterminate.  Cardiac Cath 12/18/2011  1. Normal coronary arteries. 2. Normal abdominal aorta, iliac and femoral arteries. 3. Left atrial myxoma identified by neovascularization during right coronary angiography. 4. Left ventriculography was not performed.  MRI Abdomen 12/22/2011 1. Diminished exam detail due to respiratory motion artifact. 2. No pancreatic mass identified. 3. Indeterminant T2 intense structure within the right hepatic lobe. Not imaged on the T1-weighted images were the postcontrast images. Because of the patient's difficulty with breath-hold I would advise a three-phase liver imaging with contrast enhanced CT of the abdomen for more definitive assessment.  EKG  normal sinus rhythm.   Physical Exam     Pleasant middle-age [place] American lady currently not in distress.Awake alert. Afebrile. Head is nontraumatic. Neck is supple without bruit. Hearing is normal. Cardiac exam no murmur or gallop. Lungs are clear to auscultation. Distal pulses are well felt.  Neurological Exam ; awake alert mild expressive aphasia with word finding difficulties and paraphasias.verbal perseveration. Good comprehension  and repitition but poor naming. Able to follow commands well. Extraocular movements are full range without nystagmus. No visual field defect to bedside confrontational testing. Mild right lower facial weakness. Tongue is midline. Motor system exam reveals mild right hemiparesis with RUE 3/5 weaknessand moderate right grip and intrinsic hand muscles. Good right lower extremity strength 4/5  without drift. The minimum right hip flexion and ankle dorsiflexion weakness. Sensation slightly diminished on the right compared to the left. Coordination is slow but accurate. Gait was not tested.  ASSESSMENT Ms. Courtney Faulkner is a 61 y.o. 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 Status post successful myxoma excision by Dr. Cornelius Moras 12/24/2011. Now in SICU post op. Neuro status stable. On no antiplatelets prior to admission. Now on aspirin 325 mg orally every day for secondary stroke prevention. Patient with resultant right hemiparesis and expressive aphasia with overall improvement.  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  -left ICA occlusion suspect secondary to myxoma emboli vs dissection-unclear as dental artifacts interfere with images. will check repeat CT angio in 2-3 weeks with chin extension to avoid dental artifacts as in case of myxomatous occlusion, may need tumor removal by vascular surgery -tachycardia -hypoglycemia, recurrent on admission. Per IM - elevated  C-peptide, elevated proinsulin, normal insulin levels. Differential includes insulinoma vs insulin autoimmune hypoglycemia vs infectious etiology (with myxoma as possible source). CT and MRI  abd showed no lesions. GI recommend EUS for insulinoma, per CVTS not stable for procedure for several months post-op. monitoring CBGs -elevated cardiac enzymes on admission, cardiac related.  -short PR -obesity, class 2, Body mass index is 36.05 kg/(m^2). -hypotension -leukocytosis  Hospital day # 18  TREATMENT/PLAN -coumadin and amiodarone for atrial fibrillation  -IP Rehab planned at discharge  Will sign off.  Please call with questions. Follow up with Dr. Pearlean Brownie in 4-6 weeks after discharge.  Will schedule CTA at follow up office visit in 2 months.   Marya Fossa PA-C [facility] [phone] 01/01/2012 9:05 AM  Dr. Delia Heady, Stroke Center Medical Director, has personally reviewed chart, pertinent data, examined the patient and developed the plan of care. Pager:  [phone]

## 2012-01-01 NOTE — Discharge Summary (Signed)
I agree with the above discharge summary and plan for follow-up.  Leeta Grimme H  

## 2012-01-01 NOTE — Progress Notes (Signed)
Physical Therapy Treatment Patient Details Name: MYKEISHA DYSERT MRN: [id number] DOB: [birth year] Today's Date: 01/01/2012 Time: 4098-1191 PT Time Calculation (min): 19 min  PT Assessment / Plan / Recommendation Comments on Treatment Session  Again lmited by dizziness today, unable to ambulate far without needing to quickly sit in the chair. RN notified.     Follow Up Recommendations  Inpatient Rehab    Equipment Recommendations  Defer to next venue    Frequency     Plan Discharge plan remains appropriate;Frequency remains appropriate    Precautions / Restrictions Precautions Precautions: Fall  Limited RUE use      Mobility  Transfers Sit to Stand: 3: Mod assist;From chair/3-in-1 Stand to Sit: 3: Mod assist;To chair/3-in-1 Details for Transfer Assistance: slight buckling in RLE (knee) initially upon standing needing modA for stability; slow to rise; specific sequencing cues for anterior translation and follow through to stand as well as to slowly lower to bed Ambulation/Gait Ambulation/Gait Assistance: 4: Min assist;3: Mod assist Ambulation Distance (Feet): 0 Feet (5x2) Assistive device: Rolling walker Ambulation/Gait Assistance Details: again limited by dizziness today. Notified the RN who reports they will check her blood sugar soon. Maybe therapy should wait until after she has her meals? Chair needing to be pulled behind her quickly to sit.  Assist to negotiate RW and cues for forward gaze to reduce dizziness.  Gait Pattern: Trunk flexed;Shuffle    Exercises General Exercises - Lower Extremity Ankle Circles/Pumps: AROM;Both;10 reps;Seated   PT Goals Acute Rehab PT Goals PT Goal: Sit to Stand - Progress: Progressing toward goal PT Goal: Stand to Sit - Progress: Progressing toward goal PT Transfer Goal: Bed to Chair/Chair to Bed - Progress: Progressing toward goal PT Goal: Ambulate - Progress: Progressing toward goal  Visit Information  Last PT Received On:  01/01/12 Assistance Needed: +2    Subjective Data  Subjective: Im just tired.    Cognition  Overall Cognitive Status: Appears within functional limits for tasks assessed/performed Behavior During Session: Flat affect Memory: Decreased recall of precautions Memory Deficits: impaired recall of precaution to limited use of RUE Following Commands: Follows one step commands consistently;Follows multi-step commands inconsistently    Balance     End of Session PT - End of Session Equipment Utilized During Treatment: Gait belt Activity Tolerance: Patient limited by fatigue;Treatment limited secondary to medical complications (Comment) (dizziness) Patient left: in chair;with call bell/phone within reach Nurse Communication: Mobility status;Other (comment) (pt's dizziness)    WHITLOW,Brach Birdsall HELEN 01/01/2012, 12:37 PM

## 2012-01-01 NOTE — Progress Notes (Signed)
Stable rhythm.

## 2012-01-01 NOTE — Progress Notes (Addendum)
8 Days Post-Op Procedure(s) (LRB): MINIMALLY INVASIVE EXCISION OF ATRIAL MYXOMA (N/A) Subjective:  Ms. Pfost has no new complaints this morning.  She continues to become fatigued with activity.  Objective: Vital signs in last 24 hours: Temp:  [98.7 F (37.1 C)-99.6 F (37.6 C)] 99.3 F (37.4 C) (05/08 0538) Pulse Rate:  [84-106] 85  (05/08 0538) Cardiac Rhythm:  [-] Normal sinus rhythm (05/07 1950) Resp:  [19-38] 19  (05/08 0538) BP: (104-131)/(59-82) 113/71 mmHg (05/08 0538) SpO2:  [83 %-99 %] 92 % (05/08 0615) Weight:  [203 lb 7.8 oz (92.3 kg)] 203 lb 7.8 oz (92.3 kg) (05/08 0538)   Intake/Output from previous day: 05/07 0701 - 05/08 0700 In: 290 [P.O.:120; I.V.:20; IV Piggyback:150] Out: 450 [Urine:450]    General appearance: alert, cooperative and no distress Heart: regular rate and rhythm Lungs: clear to auscultation bilaterally Abdomen: soft, non-tender; bowel sounds normal; no masses,  no organomegaly Extremities: edema trace Wound: clean and dry  Lab Results:  Basename 01/01/12 0500 12/30/11 0400  WBC 8.2 9.2  HGB 8.5* 8.5*  HCT 26.4* 26.7*  PLT 434* 360   BMET:  Basename 12/31/11 0416 12/30/11 0400  NA 138 137  K 3.4* 3.6  CL 100 102  CO2 28 28  GLUCOSE 91 98  BUN 10 14  CREATININE 0.66 0.62  CALCIUM 9.0 8.9    PT/INR:  Basename 01/01/12 0500  LABPROT 16.7*  INR 1.33   ABG    Component Value Date/Time   PHART 7.378 12/24/2011 2327   HCO3 24.0 12/24/2011 2327   TCO2 25 12/25/2011 1725   ACIDBASEDEF 1.0 12/24/2011 2327   O2SAT 99.0 12/24/2011 2327   CBG (last 3)   Basename 01/01/12 0634 12/31/11 2100 12/31/11 1603  GLUCAP 85 90 88    Assessment/Plan: S/P Procedure(s) (LRB): MINIMALLY INVASIVE EXCISION OF ATRIAL MYXOMA (N/A)  2. CV- NSR, tachy, blood pressure controlled, will continue Amiodarone and Metoprolol 3. INR 1.33, will continue 5mg  Coumadin nightly 4. CBGs- controlled continue current regimen 5. Deconditioning- S/P Stroke at  admission, continue PT/OT recommending inpatient rehab 6. Hypokalemia- BMET pending, will continue potassium supplementation 7.Fluid Balance- patients weight below preop, will continue Lasix for now 7. Dispo- rhythm remains stable, INR subtherapeutic, will likely be ready for d/c in next 24-48 hours  LOS: 18 days    BARRETT, ERIN 01/01/2012   I have seen and examined the patient and agree with the assessment and plan as outlined.  CXR looks wet.  Will give lasix IV today.  Supplement K+  Marquisha Nikolov H 01/01/2012 9:28 AM

## 2012-01-01 NOTE — Progress Notes (Addendum)
I have contacted [employer] for insurance approval to hopefully admit pt to inpt rehab tomorrow pending Attending MD approval. [phone] with questions. I contacted pt's spouse by phone and he sates he will call me back. He also states that he has not made his decisions concerning where yet, and he will call me back. Pt's daughter arriving from [place] today.

## 2012-01-01 NOTE — Discharge Summary (Signed)
Physician Discharge Summary  Patient ID: Courtney Faulkner MRN: [id number] DOB/AGE: [birth year] 60 y.o.  Admit date: 12/14/2011 Discharge date: 01/01/2012  Admission Diagnoses:  Patient Active Problem List  Diagnoses  . Hypoglycemia  . Anemia  . Atrial myxoma  . CVA (cerebral infarction)  . ICAO (internal carotid artery occlusion), left   Discharge Diagnoses:   Patient Active Problem List  Diagnoses  . Hypoglycemia  . Anemia  . Atrial myxoma  . CVA (cerebral infarction)  . ICAO (internal carotid artery occlusion), left    Discharged Condition: good  Hospital Course:   Ms. Daffin is a 61 yo African American female who was brought to the Emergency Department by EMS on 12/14/2011 after being found down by her husband with slurred speech and incoherent speech.  EMS evaluation found the patient to be hypoglycemic with a blood gluose of 34, she was given an AMP of D50 with subsequent blood glucose of 54.  Stroke page was placed prior to arrival to the ED.  Upon arrival patient was more alert.  She states that she could not recall what happened.  She denied weakness at arrival.  She denied history of DM, but has had hypoglycemic episodes before.  The patient was taken straight to the CT scan which did not reveal any evidence of a bleed, mass, or acute process.  Neurology was consulted and exam revealed the patient to be suffering right sided weakness and global aphasia.  They felt this was most consistent with a left sided stroke and MRI was ordered.  They also ordered an Echocardiogram and Carotid Dopplers.  MRI revealed the patient had suffered a moderate size acute left hemisphere infarct which appeared to be secondary to an acute left internal carotid artery occlusion.  The patient was admitted by the medicine service who placed the patient in stepdown unit.  She was placed on D5 at 50cc/hr for her hypoglycemia.  HD #1 the patient underwent went carotid doppler which again confirmed a Left  ICA occlusion.  Neurology felt patients residual stroke symptoms had improved. They recommended CTA of the neck.  Internal medicine had ordered cardiac enzymes on admission which were mildly elevated.  They continued to follow.  Later that morning patient developed worsening of her stroke like symptoms.  The patient developed blurry visions and dysphagia.  Stat CT of the head was obtained and revealed a developing cytotoxic edema correlated with the observed pattern of restricted diffusion of MR representing acute infarction.  Due to these findings the patient was admitted to ICU under the care of critical care medicine.  HD #2  Neurology recommended placing patient on ASA and Plavix for further stroke prevention.  Cardiology consult was requested.  They performed an Echocardiogram which revealed a highly mobile frond like mass attached to the left intra-atrial septum, which is suggestive of an atrial myxoma.  Due to these findings Cardiothoracic surgery was consulted for possible intervention.  Dr. Tyrone Sage evaluated the patient and felt she would require surgical resection of the myxoma.  However, due to elevated cardiac enzymes he recommended the patient undergo TEE and cardiac catheterization prior to proceeding with surgery.  HD #3 the patient was evaluated by Cardiology who agreed surgical resection was the best treatment plan.  They performed TEE which again confirmed the presence of a left atrial myxoma.  HD #4 the patient underwent cardiac catheterization which revealed normal coronary arteries, normal abdominal aorta, iliac, and femoral arteries.  Dr. Tyrone Sage again evaluated the patient and recommended  surgical resection of the myxoma.  However, it was felt this could possibly be performed by minimally invasive approach.  Therefore, she was referred to Dr. Cornelius Moras for further evaluation.  HD #5 the patient was cleared for surgery by Neurology.  She was placed on IV Heparin.  Her blood cultures came back  positive.  However, it was felt most likely to be contaminate the patient remainen on antibiotics and repeat blood cultures obtained.  HD #6 Dr. Cornelius Moras spoke with the patient discussing the risks and benefits of surgical resection of her atrial myxoma.  All of the patients questions were answered and she was willing to proceed with surgery.  HD #7 the patient developed Supraventricular tachycardia and was placed on Amiodarone.  Gastroenterology was consulted to assess for possible insulinoma as source of hypoglycemia.  They recommended MRI of abdomen with special attention to pancreas.  This was performed and did not reveal any evidence of insulinoma.  HD #10 the patient was taken to the OR and underwent Minimally Invasive excision of atrial myxoma.  She tolerated the procedure well and was taken to SICU in stable condition.  POD #0 the patient was extubated.  POD #1 the patients neuro status was stable.  She was placed on Diflucan for preop urine culture positive for yeast.  POD #2 the patients IV Amiodarone was discontinued and she was started on an oral regimen.  POD #3 the patient was hypotensive with post operative anemia.  She was transfused 1U of PRBCs.  POD #4 the patients foley catheter was removed.  POD #5 the patient's chest tubes and pacing wires were removed without difficulty.  POD #6  Patient with persistent A. Fib.  Her metoprolol and Amiodarone were increased.  POD #7 the patient's rhythm improved with use of metoprolol and amiodarone.  She was medically stable and transferred to step down unit.  POD #8 the patient continues to improve.  Throughout her stay she has participated with PT/OT who continue to recommend inpatient rehab.  Her INR is 1.33 and she is currently on 5mg  Coumadin daily and we will continue this at discharge.  Also throughout her stay she has been working with speech pathology and she has continued to improve.  In regards to her hypoglycemia, her blood sugars have been doing well  since surgery. Should the patient continue to improve and no further issues arise she will be ready for d/c to inpatient rehab in the next 24-48 hours.  Consults: cardiology, pulmonary/intensive care, GI, neurology and rehabilitation medicine  Disposition: Inpatient rehab   Medication List  As of 01/01/2012  8:49 AM   TAKE these medications         albuterol 108 (90 BASE) MCG/ACT inhaler   Commonly known as: PROVENTIL HFA;VENTOLIN HFA   Inhale 2 puffs into the lungs every 6 (six) hours as needed. For wheezing      amiodarone 400 MG tablet   Commonly known as: PACERONE   Take 1 tablet (400 mg total) by mouth 2 (two) times daily after a meal.      aspirin 81 MG EC tablet   Take 1 tablet (81 mg total) by mouth daily.      furosemide 40 MG tablet   Commonly known as: LASIX   Take 1 tablet (40 mg total) by mouth daily. For 7 days      metoprolol 100 MG tablet   Commonly known as: LOPRESSOR   Take 1 tablet (100 mg total) by mouth 2 (two)  times daily.      potassium chloride SA 20 MEQ tablet   Commonly known as: K-DUR,KLOR-CON   Take 2 tablets (40 mEq total) by mouth daily. For 7 days      predniSONE 10 MG tablet   Commonly known as: DELTASONE   Take 10 mg by mouth daily. 12 day pack filled on 12-06-11      traMADol 50 MG tablet   Commonly known as: ULTRAM   Take 1-2 tablets (50-100 mg total) by mouth every 4 (four) hours as needed.      warfarin 5 MG tablet   Commonly known as: COUMADIN   Take 1 tablet (5 mg total) by mouth daily at 6 PM.             Signed: Verdie Wilms 01/01/2012, 8:49 AM

## 2012-01-01 NOTE — Progress Notes (Signed)
Patient walked from chair to bed, she refused to walk any further at this time.  Will continue to monitor and ask again later on.

## 2012-01-02 ENCOUNTER — Inpatient Hospital Stay (HOSPITAL_COMMUNITY)
Admission: RE | Admit: 2012-01-02 | Discharge: 2012-01-17 | DRG: 462 | Disposition: A | Discharge status: Home Health | Payer: BC Managed Care – PPO | Source: Ambulatory Visit | Attending: Physical Medicine & Rehabilitation | Admitting: Physical Medicine & Rehabilitation

## 2012-01-02 ENCOUNTER — Inpatient Hospital Stay (HOSPITAL_COMMUNITY): Payer: BC Managed Care – PPO

## 2012-01-02 DIAGNOSIS — I63239 Cerebral infarction due to unspecified occlusion or stenosis of unspecified carotid arteries: Secondary | ICD-10-CM

## 2012-01-02 DIAGNOSIS — R3915 Urgency of urination: Secondary | ICD-10-CM

## 2012-01-02 DIAGNOSIS — IMO0002 Reserved for concepts with insufficient information to code with codable children: Secondary | ICD-10-CM

## 2012-01-02 DIAGNOSIS — Z5189 Encounter for other specified aftercare: Principal | ICD-10-CM

## 2012-01-02 DIAGNOSIS — Z7982 Long term (current) use of aspirin: Secondary | ICD-10-CM

## 2012-01-02 DIAGNOSIS — Z79899 Other long term (current) drug therapy: Secondary | ICD-10-CM

## 2012-01-02 DIAGNOSIS — I6330 Cerebral infarction due to thrombosis of unspecified cerebral artery: Secondary | ICD-10-CM

## 2012-01-02 DIAGNOSIS — I4891 Unspecified atrial fibrillation: Secondary | ICD-10-CM

## 2012-01-02 DIAGNOSIS — R4789 Other speech disturbances: Secondary | ICD-10-CM

## 2012-01-02 DIAGNOSIS — K5900 Constipation, unspecified: Secondary | ICD-10-CM

## 2012-01-02 DIAGNOSIS — D151 Benign neoplasm of heart: Secondary | ICD-10-CM

## 2012-01-02 DIAGNOSIS — E162 Hypoglycemia, unspecified: Secondary | ICD-10-CM

## 2012-01-02 DIAGNOSIS — Z7901 Long term (current) use of anticoagulants: Secondary | ICD-10-CM

## 2012-01-02 DIAGNOSIS — Z87891 Personal history of nicotine dependence: Secondary | ICD-10-CM

## 2012-01-02 DIAGNOSIS — I509 Heart failure, unspecified: Secondary | ICD-10-CM

## 2012-01-02 DIAGNOSIS — Z9889 Other specified postprocedural states: Secondary | ICD-10-CM

## 2012-01-02 LAB — GLUCOSE, CAPILLARY
Glucose-Capillary: 124 mg/dL — ABNORMAL HIGH (ref 70–99)
Glucose-Capillary: 65 mg/dL — ABNORMAL LOW (ref 70–99)
Glucose-Capillary: 101 mg/dL — ABNORMAL HIGH (ref 70–99)

## 2012-01-02 LAB — PRO B NATRIURETIC PEPTIDE: Pro B Natriuretic peptide (BNP): 1066 pg/mL — ABNORMAL HIGH (ref 0–125)

## 2012-01-02 MED ORDER — ALTEPLASE 100 MG IV SOLR
2.0000 mg | Freq: Once | INTRAVENOUS | Status: AC
  Administered 2012-01-02: 2 mg
  Filled 2012-01-02: qty 2

## 2012-01-02 MED ORDER — SENNOSIDES-DOCUSATE SODIUM 8.6-50 MG PO TABS
2.0000 | ORAL_TABLET | Freq: Every day | ORAL | Status: DC
  Administered 2012-01-02 – 2012-01-03 (×2): 2 via ORAL
  Filled 2012-01-02 (×5): qty 2

## 2012-01-02 MED ORDER — WARFARIN SODIUM 5 MG PO TABS
5.0000 mg | ORAL_TABLET | Freq: Every day | ORAL | Status: DC
  Administered 2012-01-02 – 2012-01-03 (×2): 5 mg via ORAL
  Filled 2012-01-02 (×3): qty 1

## 2012-01-02 MED ORDER — FUROSEMIDE 40 MG PO TABS
40.0000 mg | ORAL_TABLET | Freq: Every day | ORAL | Status: AC
  Administered 2012-01-03 – 2012-01-09 (×7): 40 mg via ORAL
  Filled 2012-01-02 (×7): qty 1

## 2012-01-02 MED ORDER — TRAMADOL HCL 50 MG PO TABS
50.0000 mg | ORAL_TABLET | ORAL | Status: DC | PRN
  Filled 2012-01-02: qty 2

## 2012-01-02 MED ORDER — ENOXAPARIN SODIUM 40 MG/0.4ML ~~LOC~~ SOLN
40.0000 mg | SUBCUTANEOUS | Status: DC
  Administered 2012-01-03 – 2012-01-04 (×2): 40 mg via SUBCUTANEOUS
  Filled 2012-01-02 (×3): qty 0.4

## 2012-01-02 MED ORDER — SODIUM CHLORIDE 0.9 % IJ SOLN
10.0000 mL | Freq: Two times a day (BID) | INTRAMUSCULAR | Status: DC
  Administered 2012-01-09: 10 mL
  Administered 2012-01-10: 12 mL
  Administered 2012-01-11: 10 mL

## 2012-01-02 MED ORDER — SODIUM CHLORIDE 0.9 % IJ SOLN
10.0000 mL | INTRAMUSCULAR | Status: DC | PRN
  Administered 2012-01-02: 10 mL

## 2012-01-02 MED ORDER — ONDANSETRON HCL 4 MG/2ML IJ SOLN: 4.0000 mg | Freq: Four times a day (QID) | INTRAMUSCULAR | Status: DC | PRN

## 2012-01-02 MED ORDER — AMIODARONE HCL 200 MG PO TABS
400.0000 mg | ORAL_TABLET | Freq: Two times a day (BID) | ORAL | Status: DC
  Administered 2012-01-02 – 2012-01-03 (×2): 400 mg via ORAL
  Filled 2012-01-02 (×4): qty 2

## 2012-01-02 MED ORDER — POLYETHYLENE GLYCOL 3350 17 G PO PACK
17.0000 g | PACK | Freq: Every day | ORAL | Status: DC | PRN
  Administered 2012-01-03: 17 g via ORAL
  Filled 2012-01-02: qty 1

## 2012-01-02 MED ORDER — PANTOPRAZOLE SODIUM 40 MG PO TBEC
40.0000 mg | DELAYED_RELEASE_TABLET | Freq: Every day | ORAL | Status: DC
  Administered 2012-01-03 – 2012-01-17 (×15): 40 mg via ORAL
  Filled 2012-01-02 (×17): qty 1

## 2012-01-02 MED ORDER — ALBUTEROL SULFATE HFA 108 (90 BASE) MCG/ACT IN AERS
2.0000 | INHALATION_SPRAY | Freq: Four times a day (QID) | RESPIRATORY_TRACT | Status: DC | PRN
  Filled 2012-01-02: qty 6.7

## 2012-01-02 MED ORDER — SODIUM CHLORIDE 0.9 % IJ SOLN
10.0000 mL | INTRAMUSCULAR | Status: DC | PRN
  Administered 2012-01-02 – 2012-01-06 (×5): 10 mL
  Administered 2012-01-06: 20 mL
  Administered 2012-01-06: 10 mL
  Administered 2012-01-07 – 2012-01-08 (×5): 20 mL
  Administered 2012-01-09: 10 mL
  Administered 2012-01-09: 20 mL
  Administered 2012-01-10: 10 mL
  Administered 2012-01-10: 20 mL
  Administered 2012-01-10: 10 mL
  Administered 2012-01-11: 20 mL
  Administered 2012-01-12 (×2): 10 mL
  Administered 2012-01-13: 20 mL
  Administered 2012-01-14: 10 mL
  Administered 2012-01-14 – 2012-01-15 (×3): 20 mL

## 2012-01-02 MED ORDER — POTASSIUM CHLORIDE CRYS ER 20 MEQ PO TBCR
40.0000 meq | EXTENDED_RELEASE_TABLET | Freq: Every day | ORAL | Status: AC
  Administered 2012-01-03 – 2012-01-09 (×7): 40 meq via ORAL
  Filled 2012-01-02 (×7): qty 2

## 2012-01-02 MED ORDER — SORBITOL 70 % SOLN: 30.0000 mL | Freq: Every day | Status: DC | PRN

## 2012-01-02 MED ORDER — ASPIRIN EC 81 MG PO TBEC
81.0000 mg | DELAYED_RELEASE_TABLET | Freq: Every day | ORAL | Status: DC
  Administered 2012-01-03 – 2012-01-17 (×15): 81 mg via ORAL
  Filled 2012-01-02 (×16): qty 1

## 2012-01-02 MED ORDER — ONDANSETRON HCL 4 MG PO TABS
4.0000 mg | ORAL_TABLET | Freq: Four times a day (QID) | ORAL | Status: DC | PRN
  Administered 2012-01-13: 4 mg via ORAL
  Filled 2012-01-02: qty 1

## 2012-01-02 MED ORDER — ACETAMINOPHEN 325 MG PO TABS: 325.0000 mg | ORAL_TABLET | ORAL | Status: DC | PRN

## 2012-01-02 MED ORDER — METOPROLOL TARTRATE 100 MG PO TABS
100.0000 mg | ORAL_TABLET | Freq: Two times a day (BID) | ORAL | Status: DC
  Administered 2012-01-02 – 2012-01-08 (×12): 100 mg via ORAL
  Filled 2012-01-02 (×15): qty 1

## 2012-01-02 NOTE — Progress Notes (Signed)
Physical Therapy Treatment Patient Details Name: Courtney Faulkner MRN: [id number] DOB: [birth year] Today's Date: 01/02/2012 Time: 1191-4782 PT Time Calculation (min): 29 min  PT Assessment / Plan / Recommendation Comments on Treatment Session  Pt s/p L CVA and myxoma removal. Pt able to ambulate today with gaze fixation without dizziness. Pt with appropriate CBG but dizziness with visual tracking anywhere in Right visual field with max cueing for gaze fixation pt with difficulty following command to maintain eyes open and gaze on target. Pt with elevated HR after gait terminating further actiivty, RN aware and HR decreased before leaving pt room.     Follow Up Recommendations       Barriers to Discharge        Equipment Recommendations       Recommendations for Other Services    Frequency     Plan Discharge plan remains appropriate;Frequency remains appropriate    Precautions / Restrictions Precautions Precautions: Fall Restrictions Weight Bearing Restrictions: No   Pertinent Vitals/Pain HR up to 158 after gait with return to 107 after 6 min seated rest    Mobility  Bed Mobility Bed Mobility: Sitting - Scoot to Edge of Bed;Supine to Sit Supine to Sit: 3: Mod assist;HOB elevated (HOB 20degrees) Sitting - Scoot to Edge of Bed: 4: Min assist Details for Bed Mobility Assistance: cueing for sequence to EOB with limited RUE use and assist of pad to scoot pelvis to edge of surface Transfers Transfers: Sit to Stand;Stand to Sit Sit to Stand: 3: Mod assist;From bed;From chair/3-in-1 Stand to Sit: To bed;To chair/3-in-1;3: Mod assist Stand Pivot Transfers: 3: Mod assist Details for Transfer Assistance: Pt with cueing for hand placement, sequence and precautions standing x 2 from bed and x 1 from [facility]. Assist to control descent to surface in standing and cueing and assist to sequence and maintain balance with tranfer bed to [facility] to pt's left Ambulation/Gait Ambulation/Gait Assistance: 1:  +2 Total assist Ambulation/Gait: Patient Percentage: 70% Ambulation Distance (Feet): 55 Feet Assistive device: Rolling walker Ambulation/Gait Assistance Details: Cueing and assist to hold head for first half of ambulation for gaze fixation to limit dizziness and progress activity. Pt with cueing for sequence and assist to manipulate RW and direct pt to maintain gaze fixation. Pt fatigued end of ambulation with return to room pt monitor showing HR 158 with rest down to 120 and another 3 min back to 110, pt reported fatigue only but no dizziness with gait with gaze fixation. Gait Pattern: Step-to pattern;Decreased stance time - right;Trunk flexed Stairs: No    Exercises     PT Diagnosis:    PT Problem List:   PT Treatment Interventions:     PT Goals Acute Rehab PT Goals Pt will go Supine/Side to Sit: with min assist;with HOB 0 degrees PT Goal: Supine/Side to Sit - Progress: Progressing toward goal PT Goal: Sit at Edge Of Bed - Progress: Progressing toward goal PT Goal: Sit to Supine/Side - Progress: Progressing toward goal PT Goal: Sit to Stand - Progress: Progressing toward goal PT Goal: Stand to Sit - Progress: Progressing toward goal PT Transfer Goal: Bed to Chair/Chair to Bed - Progress: Progressing toward goal Pt will Ambulate: 16 - 50 feet;with supervision;with least restrictive assistive device PT Goal: Ambulate - Progress: Updated due to goal met  Visit Information  Last PT Received On: 01/02/12 Assistance Needed: +2 PT/OT Co-Evaluation/Treatment: Yes    Subjective Data  Subjective: i'm dizzy   Cognition  Overall Cognitive Status: Impaired Area  of Impairment: Following commands Behavior During Session: Flat affect Following Commands: Follows multi-step commands inconsistently    Balance  Static Sitting Balance Static Sitting - Balance Support: Feet supported;No upper extremity supported Static Sitting - Level of Assistance: 5: Stand by assistance Static Sitting  - Comment/# of Minutes: 3  End of Session PT - End of Session Equipment Utilized During Treatment: Gait belt Activity Tolerance: Patient limited by fatigue;Treatment limited secondary to medical complications (Comment) Patient left: in chair;with call bell/phone within reach Nurse Communication: Mobility status    Delorse Lek 01/02/2012, 11:26 AM Delaney Meigs, PT [phone]

## 2012-01-02 NOTE — Plan of Care (Addendum)
Overall Plan of Care [facility]) Patient Details Name: Courtney Faulkner MRN: [id number] DOB: 25-Apr-1951  Diagnosis:  Rehabilitation for CVA Primary Diagnosis:    L ICA infarct Co-morbidities: Decreased memory, concentration, R hemiparesis  Functional Problem List  Patient demonstrates impairments in the following areas: Balance, Bladder, Cognition, Edema, Endurance, Medication Management, Motor and Vision  Basic ADL's: bathing, dressing and toileting Advanced ADL's: simple meal preparation  Transfers:  bed mobility, bed to chair, toilet, car, furniture and floor Locomotion:  ambulation, wheelchair mobility and stairs  Additional Impairments:  None  Anticipated Outcomes Item Anticipated Outcome  Eating/Swallowing    Basic self-care  Mod I  Tolieting  Mod I  Bowel/Bladder  Mod I bowel and bladder  Transfers  Mod I  Locomotion  Mod I short distances  Communication    Cognition    Pain  N/A  Safety/Judgment    Other     Therapy Plan: PT Frequency: Total of 15 hours over 7 days Frequency due to: significant fatigue, low Sp02 on RA: required 2L 02 to maintain Sp02 >90% with activity OT Frequency: 1-2 X/day, 60-90 minutes" with 15 hours over 7 days SLP Frequency: 5 out of 7 days   Team Interventions: Item RN PT OT SLP SW TR Other  Self Care/Advanced ADL Retraining   x      Neuromuscular Re-Education  x x      Therapeutic Activities  x x   x   UE/LE Strength Training/ROM  x x   x   UE/LE Coordination Activities  x x   xx   Visual/Perceptual Remediation/Compensation   x      DME/Adaptive Equipment Instruction  x x   x   Therapeutic Exercise  x x   x   Balance/Vestibular Training  x x   x   Patient/Family Education x x x   x   Cognitive Remediation/Compensation      x   Functional Mobility Training  x x   x   Ambulation/Gait Training  x       Stair Training  x       Wheelchair Propulsion/Positioning  x    x   Functional Statistician  x       Community  Reintegration      x   Dysphagia/Aspiration Film/video editor         Bladder Management x        Bowel Management         Disease Management/Prevention         Pain Management         Medication Management x        Skin Care/Wound Management x        Splinting/Orthotics  x x      Discharge Planning  x x   x   Psychosocial Support      x                      Team Discharge Planning: Destination:  Home Projected Follow-up:  PT and OT, Home Health vs. Outpatient/cardiac rehab Projected Equipment Needs:  Cushion, Tub Bench, Water engineer but will be determined closer to D/C Patient/family involved in discharge planning:  Yes  MD ELOS: 3 wks Medical Rehab Prognosis:  Good Assessment: 61 yo femal working FT who was admitted for CVA, now requiring CIR level PT,OT,SLP,24/7 rehab RN and MD

## 2012-01-02 NOTE — Progress Notes (Signed)
I met with patient at bedside. She is aware and in agreement to admit to inpt rehab today. I do have insurance approval. I have left a message for pt's husband to contact me as to his choice of rehab venue as a follow up to our discussion yesterday. I await his call back to admit pt. [phone] with questions.

## 2012-01-02 NOTE — PMR Pre-admission (Signed)
PMR Admission Coordinator Pre-Admission Assessment  Patient: Courtney Faulkner is an 61 y.o., female MRN: [id number] DOB: Jun 08, 1951 Height:   Weight:    Insurance Information HMO:   PPO: yes     PCP:      IPA:      80/20:      OTHER:  PRIMARY: [employer]      Policy#: [id number]      Subscriber: pt CM Name: Angie      Phone#: [phone]     Fax#: [phone] Pre-Cert#: [id number] update 5/15 after conference      Employer: [employer] Benefits:  Phone #: [phone]     Name: 5/3 Saundra Eff. Date: 04/26/10 active     Deduct: $933      Out of Pocket Max: $3793      Life Max: none CIR: $291 copay per admission then & 70% coverage     SNF: 70/30% Outpatient: 70%     Co-Pay: 30% no max limits Home Health: 70%      Co-Pay: 30% DME: 70%     Co-Pay: 30% Providers: in network  SECONDARY: none       Medicaid Application Date:      Case Manager:  Disability Application Date:       Case Worker:   Emergency Conservator, museum/gallery Information    Name Relation Home Work Mobile   Weich,Michael Spouse [phone]  [phone]     Current Medical History  Patient Admitting Diagnosis: Left MCA territory infarct  History of Present Illness:61 year old right-handed female who 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 Admitted 12/14/2011 was unremarkable past medical history presented with altered mental status and slurred speech. EMS was called by family with noted blood sugar of 34. She was given an amp of D50 with repeat blood sugars of 54. She had initial cranial CT scan that was negative. MRI of the brain showed moderate size acute left hemispheric infarction appeared to be secondary to acute left ICA carotid artery occlusion. CT angiogram head shows acute nonhemorrhagic infarct left basal ganglia and corona radiata. CT angiogram neck with abnormal appearance of the left ICA artery with appearance of dissection with complete occlusion at the C2 level. Carotid Dopplers  with left eyes see a occlusion. Echocardiogram with ejection fraction 65% with no source of embolus. Noted left atrial mass attached to intra-atrial septum highly suggestive of myxoma. TEE identifies 1.7 x 1.7 cm mass left atrial septum again felt to be atrial myxoma. Cardiac catheterization 12/18/2011 with normal coronary arteries. Underwent excision of left atrial myxoma 12/24/2011 per Dr. Barry Dienes. Maintained on aspirin therapy at the recommendations of neurology. Subcutaneous Lovenox initiated for deep vein thrombosis prophylaxis.  Cardiology following for patient with Afib with RVR. Given IV amiodarone and low dose lopressor.. SR currently 01/02/12. Some sob with activity. Continue diuresis, pulmonary toliet, repeat CXR. Some concern with amiodarone.  Neurology recommends a CTA with chin up to remove dental artifact in order to determine ICA dissection vs tumor close to discharge. Dr. Constance Goltz and Dr. Pearlean Brownie can clarify of questions when ordering.       Past Medical History  Past Medical History  Diagnosis Date  . Upper respiratory tract infection   . ICAO (internal carotid artery occlusion), left 12/15/2011  . 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 CVA (cerebral infarction) 12/15/2011  . Hypoglycemia 12/14/2011    Family History  family history is negative for Coronary artery disease.  Prior Rehab/Hospitalizations: none  Current Medications  No current  facility-administered medications for this encounter. Current outpatient prescriptions:albuterol (PROVENTIL HFA;VENTOLIN HFA) 108 (90 BASE) MCG/ACT inhaler, Inhale 2 puffs into the lungs every 6 (six) hours as needed. For wheezing, Disp: , Rfl: ;  amiodarone (PACERONE) 400 MG tablet, Take 1 tablet (400 mg total) by mouth 2 (two) times daily after a meal., Disp: 60 tablet, Rfl: 1;  aspirin EC 81 MG EC tablet, Take 1 tablet (81 mg total) by mouth daily., Disp: , Rfl:  furosemide (LASIX) 40 MG tablet, Take 1 tablet (40 mg total) by mouth daily. For 7 days, Disp:  7 tablet, Rfl: 0;  metoprolol (LOPRESSOR) 100 MG tablet, Take 1 tablet (100 mg total) by mouth 2 (two) times daily., Disp: 60 tablet, Rfl: 1;  potassium chloride SA (K-DUR,KLOR-CON) 20 MEQ tablet, Take 2 tablets (40 mEq total) by mouth daily. For 7 days, Disp: 14 tablet, Rfl: 0 predniSONE (DELTASONE) 10 MG tablet, Take 10 mg by mouth daily. 12 day pack filled on 12-06-11, Disp: , Rfl: ;  traMADol (ULTRAM) 50 MG tablet, Take 1-2 tablets (50-100 mg total) by mouth every 4 (four) hours as needed., Disp: 30 tablet, Rfl: 0;  warfarin (COUMADIN) 5 MG tablet, Take 1 tablet (5 mg total) by mouth daily at 6 PM., Disp: 30 tablet, Rfl: 1 Facility-Administered Medications Ordered in Other Encounters: 0.9 %  sodium chloride infusion, 250 mL, Intravenous, PRN, Purcell Nails, MD;  acetaminophen (TYLENOL) tablet 650 mg, 650 mg, Oral, Q6H PRN, Purcell Nails, MD;  amiodarone (PACERONE) tablet 400 mg, 400 mg, Oral, BID PC, Purcell Nails, MD, 400 mg at 01/02/12 0757;  aspirin EC tablet 81 mg, 81 mg, Oral, Daily, Delight Ovens, MD, 81 mg at 01/02/12 1000 bisacodyl (DULCOLAX) EC tablet 10 mg, 10 mg, Oral, Daily PRN, Delight Ovens, MD;  bisacodyl (DULCOLAX) suppository 10 mg, 10 mg, Rectal, Daily PRN, Delight Ovens, MD;  docusate sodium (COLACE) capsule 200 mg, 200 mg, Oral, Daily, Delight Ovens, MD, 200 mg at 01/02/12 1000;  enoxaparin (LOVENOX) injection 40 mg, 40 mg, Subcutaneous, Q24H, Purcell Nails, MD, 40 mg at 01/02/12 0757 furosemide (LASIX) injection 40 mg, 40 mg, Intravenous, Q8H, Purcell Nails, MD, 40 mg at 01/01/12 1655;  furosemide (LASIX) tablet 40 mg, 40 mg, Oral, BID, Purcell Nails, MD, 40 mg at 01/02/12 0757;  insulin aspart (novoLOG) injection 0-24 Units, 0-24 Units, Subcutaneous, TID AC & HS, Delight Ovens, MD, 2 Units at 01/02/12 1151;  metoprolol (LOPRESSOR) tablet 100 mg, 100 mg, Oral, BID, Purcell Nails, MD, 100 mg at 01/02/12 1001 ondansetron (ZOFRAN) injection 4 mg, 4 mg,  Intravenous, Q6H PRN, Delight Ovens, MD;  ondansetron [facility]) tablet 4 mg, 4 mg, Oral, Q6H PRN, Delight Ovens, MD;  pantoprazole (PROTONIX) EC tablet 40 mg, 40 mg, Oral, QAC breakfast, Delight Ovens, MD, 40 mg at 01/02/12 [phone];  potassium chloride SA (K-DUR,KLOR-CON) CR tablet 40 mEq, 40 mEq, Oral, Daily, Purcell Nails, MD, 40 mEq at 01/02/12 1001 sodium chloride 0.9 % injection 10-40 mL, 10-40 mL, Intracatheter, PRN, Purcell Nails, MD, 10 mL at 01/02/12 0526;  sodium chloride 0.9 % injection 3 mL, 3 mL, Intravenous, Q12H, Purcell Nails, MD, 10 mL at 12/31/11 1458;  sodium chloride 0.9 % injection 3 mL, 3 mL, Intravenous, PRN, Purcell Nails, MD;  traMADol Janean Sark) tablet 50-100 mg, 50-100 mg, Oral, Q4H PRN, Delight Ovens, MD warfarin (COUMADIN) tablet 5 mg, 5 mg, Oral, q1800, Purcell Nails, MD,  5 mg at 01/01/12 1655;  Warfarin - Physician Dosing Inpatient, , Does not apply, q1800, Purcell Nails, MD  Patients Current Diet:    Precautions / Restrictions     Prior Activity Level   Home Assistive Devices / Equipment    Prior Functional Level    Current Functional Level Cognition                   ADLs       Mobility       Transfers       Ambulation / Gait / Stairs / Engineer, drilling / Balance     SLP     Aphasia treatment.  Regular diet and thin       liquids  Previous Home Environment    Discharge Living Setting    Social/Family/Support Systems    Goals/Additional Needs    Patient Condition: Please see physician update to information in consult dated 12/26/11.  Preadmission Screen Completed By:  Erick Colace, 01/02/2012 3:01 PM ______________________________________________________________________   Discussed status with Dr. Wynn Banker on 5/9/3 at  1343 and received telephone approval for admission today.  Admission Coordinator:  Erick Colace, time 1343 Date 01/02/12.

## 2012-01-02 NOTE — Progress Notes (Addendum)
Patient Name: LAURNA SHETLEY Date of Encounter: 01/02/2012    SUBJECTIVE: Fatigue with activity. Dyspnea and decreased sats with activity, No chest pain.  TELEMETRY:   NSR with bursts of SVT: Filed Vitals:   01/01/12 1327 01/01/12 1957 01/02/12 0556 01/02/12 0605  BP: 110/73 119/67  111/65  Pulse: 78 97  80  Temp: 99.2 F (37.3 C) 99.4 F (37.4 C)  98.9 F (37.2 C)  TempSrc: Oral Oral  Oral  Resp: 18 19  19   Height:      Weight:    88.9 kg (195 lb 15.8 oz)  SpO2: 92% 96% 85% 98%    Intake/Output Summary (Last 24 hours) at 01/02/12 0910 Last data filed at 01/02/12 0746  Gross per 24 hour  Intake    358 ml  Output    703 ml  Net   -345 ml    LABS: Basic Metabolic Panel:  Basename 01/01/12 0500 12/31/11 0416  NA 141 138  K 3.6 3.4*  CL 99 100  CO2 29 28  GLUCOSE 80 91  BUN 10 10  CREATININE 0.72 0.66  CALCIUM 9.1 9.0  MG -- --  PHOS -- --   CBC:  Basename 01/01/12 0500  WBC 8.2  NEUTROABS --  HGB 8.5*  HCT 26.4*  MCV 95.0  PLT 434*    Radiology/Studies: 5/9 CXR IMPRESSION:  Bilateral airspace disease shows some improvement on the left. No  new abnormality.  Original Report Authenticated By: Bernadene Bell. Maricela Curet, M.D.        Physical Exam: Blood pressure 111/65, pulse 80, temperature 98.9 F (37.2 C), temperature source Oral, resp. rate 19, height 5\' 3"  (1.6 m), weight 88.9 kg (195 lb 15.8 oz), SpO2 98.00%. Weight change: -3.4 kg (-7 lb 7.9 oz)   S4 gallop. No murmur or rub.  ASSESSMENT:  1. S/P Myxoma resection  2. Paroxysmal atrial fibrillation  3. Amiodarone therapy  4. Abnormal CXR due to CHF, acute diastolic, which should improve with further diuresis.   Plan:  1. Continue to diurese 2. Check BNP and ESR 3. Rehab, IP  Signed, Lesleigh Noe 01/02/2012, 9:10 AM

## 2012-01-02 NOTE — Progress Notes (Signed)
Pt refused ambulation at this time.  Will continue to monitor and encourage to ambulate.  Arva Chafe

## 2012-01-02 NOTE — Progress Notes (Addendum)
[address]            [place] [address]          [phone]     9 Days Post-Op  Procedure(s) (LRB): MINIMALLY INVASIVE EXCISION OF ATRIAL MYXOMA (N/A) Subjective: Feels a little lightheaded after getting up to bedside comode  Objective  Telemetry short episodes of SVT yesterday morning, SR currently  Temp:  [98.9 F (37.2 C)-99.4 F (37.4 C)] 98.9 F (37.2 C) (05/09 0605) Pulse Rate:  [78-97] 80  (05/09 0605) Resp:  [18-19] 19  (05/09 0605) BP: (110-119)/(65-73) 111/65 mmHg (05/09 0605) SpO2:  [85 %-98 %] 98 % (05/09 0605) Weight:  [195 lb 15.8 oz (88.9 kg)] 195 lb 15.8 oz (88.9 kg) (05/09 0605)   Intake/Output Summary (Last 24 hours) at 01/02/12 0748 Last data filed at 01/02/12 0746  Gross per 24 hour  Intake    358 ml  Output    703 ml  Net   -345 ml       General appearance: alert, cooperative and no distress Heart: regular rate and rhythm and S1, S2 normal Lungs: mild crackles in bases Abdomen: soft, nontender Extremities: no edema Wound: incisions healing well  Lab Results:  Basename 01/01/12 0500 12/31/11 0416  NA 141 138  K 3.6 3.4*  CL 99 100  CO2 29 28  GLUCOSE 80 91  BUN 10 10  CREATININE 0.72 0.66  CALCIUM 9.1 9.0  MG -- --  PHOS -- --   No results found for this basename: AST:2,ALT:2,ALKPHOS:2,BILITOT:2,PROT:2,ALBUMIN:2 in the last 72 hours No results found for this basename: LIPASE:2,AMYLASE:2 in the last 72 hours  Basename 01/01/12 0500  WBC 8.2  NEUTROABS --  HGB 8.5*  HCT 26.4*  MCV 95.0  PLT 434*   No results found for this basename: CKTOTAL:4,CKMB:4,TROPONINI:4 in the last 72 hours No components found with this basename: POCBNP:3 No results found for this basename: DDIMER in the last 72 hours No results found for this basename: HGBA1C in the last 72 hours No results found for this basename: CHOL,HDL,LDLCALC,TRIG,CHOLHDL in the last 72 hours No results found for this basename:  TSH,T4TOTAL,FREET3,T3FREE,THYROIDAB in the last 72 hours No results found for this basename: VITAMINB12,FOLATE,FERRITIN,TIBC,IRON,RETICCTPCT in the last 72 hours  Medications: Scheduled    . amiodarone  400 mg Oral BID PC  . aspirin EC  81 mg Oral Daily  . docusate sodium  200 mg Oral Daily  . enoxaparin (LOVENOX) injection  40 mg Subcutaneous Q24H  . furosemide  40 mg Intravenous Q8H  . furosemide  40 mg Oral BID  . insulin aspart  0-24 Units Subcutaneous TID AC & HS  . metoprolol tartrate  100 mg Oral BID  . pantoprazole  40 mg Oral QAC breakfast  . potassium chloride  40 mEq Oral Daily  . potassium chloride  40 mEq Oral Once  . sodium chloride  3 mL Intravenous Q12H  . warfarin  5 mg Oral q1800  . Warfarin - Physician Dosing Inpatient   Does not apply q1800  . DISCONTD: furosemide  40 mg Oral Daily     Radiology/Studies:  Dg Chest 2 View  01/01/2012  *RADIOLOGY REPORT*  Clinical Data: Shortness of breath, weakness  CHEST - 2 VIEW  Comparison: Portable chest x-ray of 12/30/2011  Findings: The lungs remain poorly aerated with patchy airspace disease bilaterally.  Cardiomegaly is stable.  The right upper extremity PICC line tip is seen to the expected SVC - RA junction. No pneumothorax is noted.  IMPRESSION: Little change in poor aeration and airspace disease bilaterally.  Original Report Authenticated By: Juline Patch, M.D.    INR:1.47 Will add last result for INR, ABG once components are confirmed Will add last 4 CBG results once components are confirmed  Assessment/Plan: S/P Procedure(s) (LRB): MINIMALLY INVASIVE EXCISION OF ATRIAL MYXOMA (N/A)  1 conts to improve overall 2 Cont ac rx 3 air space disease, sats did drop into 80's yesterday, some concern could be related to amiodarone. Check BNP, cont diuresis, pulm toilet, rehab,repeat cxr  LOS: 19 days    GOLD,WAYNE E 5/9/20137:48 AM    I have seen and examined the patient.  CXR yesterday clearly demonstrated  pulmonary edema and atelectasis, and O2 sats increase rapidly with deep breaths and low level O2.  There is nothing to suggest that intermittently decreased O2 sats are related to amiodarone.  It is unclear to me whether or not Mrs Vanoverbeke responded to increased lasix based upon nursing documentation.  She overall looks quite good and ready for transfer to inpatient rehab service.  Jana Swartzlander H 01/02/2012 8:55 AM

## 2012-01-02 NOTE — Progress Notes (Signed)
Patient arrived to room 4144 at 1445pm.  Patient was oriented to unit and call bell.  Explained to patient that she would have a schedule for therapy beginning tomorrow.  Also introduced patient to [employer].  Patient denied any questions at the time of admission.  Admission assessment done on patient.

## 2012-01-02 NOTE — Progress Notes (Signed)
Pt's spouse has returned my call. He does approve his wife to be admitted to inpt rehab once the attending MD contacts him concerning her medical readiness to d/c there today. I have discussed with pt's nurse, pt, and RN CM to assist with this process to admit her today. We will plan admit to inpt rehab today. Please call [phone] with questions.

## 2012-01-02 NOTE — H&P (Signed)
Physical Medicine and Rehabilitation Admission H&P    No chief complaint on file. 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 Admitted 12/14/2011 with unremarkable past medical history presented with altered mental status and slurred speech. EMS was called by family with noted blood sugar of 34. She was given an amp of D50 with repeat blood sugars of 54. She had initial cranial CT scan that was negative. MRI of the brain showed moderate size acute left hemispheric infarction appeared to be secondary to acute left ICA carotid artery occlusion. Patient did not receive TPA. CT angiogram head shows acute nonhemorrhagic infarct left basal ganglia and corona radiata. CT angiogram neck with abnormal appearance of the left ICA artery with appearance of dissection with complete occlusion at the C2 level. Carotid Dopplers showed occluded left ICA. Echocardiogram with ejection fraction 65% with no source of embolus. Noted left atrial mass attached to intra-atrial septum highly suggestive of myxoma. TEE identifies 1.7 x 1.7 cm mass left atrial septum again felt to be atrial myxoma. Cardiac catheterization 12/18/2011 with normal coronary arteries. Underwent excision of left atrial myxoma 12/24/2011 per Dr. Barry Dienes. Initially maintained on aspirin therapy at the recommendations of neurology. Subcutaneous Lovenox initiated for deep vein thrombosis prophylaxis. Patient developed paroxysmal atrial fibrillation with followup cardiology services and placed on amiodarone therapy as well as Coumadin. She currently remains on Lovenox until INR greater than 2.00. Speech therapy evaluation 12/25/2011 notes deficits primarily with expression of thoughts and ideas evident with complex  information. Physical therapy as well as M.D. has requested physical medicine rehabilitation consult to consider inpatient rehabilitation services   Review of Systems  Musculoskeletal: Positive for  myalgias.  Neurological: Positive for speech change.  GI: Positive for constipation All other systems reviewed and are negative   Past Medical History  Diagnosis Date  . Upper respiratory tract infection   . ICAO (internal carotid artery occlusion), left 12/15/2011  . 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 CVA (cerebral infarction) 12/15/2011  . Hypoglycemia 12/14/2011   Past Surgical History  Procedure Date  . Minimally-invasive excision of left atrial myxoma 12/24/2011    Dr Cornelius Moras   Family History  Problem Relation Age of Onset  . Coronary artery disease Neg Hx    Social History:  reports that she quit smoking about 9 years ago. She has never used smokeless tobacco. She reports that she does not drink alcohol or use illicit drugs. Allergies: No Known Allergies Medications Prior to Admission  Medication Sig Dispense Refill  . albuterol (PROVENTIL HFA;VENTOLIN HFA) 108 (90 BASE) MCG/ACT inhaler Inhale 2 puffs into the lungs every 6 (six) hours as needed. For wheezing      . amiodarone (PACERONE) 400 MG tablet Take 1 tablet (400 mg total) by mouth 2 (two) times daily after a meal.  60 tablet  1  . aspirin EC 81 MG EC tablet Take 1 tablet (81 mg total) by mouth daily.      . furosemide (LASIX) 40 MG tablet Take 1 tablet (40 mg total) by mouth daily. For 7 days  7 tablet  0  . metoprolol (LOPRESSOR) 100 MG tablet Take 1 tablet (100 mg total) by mouth 2 (two) times daily.  60 tablet  1  . potassium chloride SA (K-DUR,KLOR-CON) 20 MEQ tablet Take 2 tablets (40 mEq total) by mouth daily. For 7 days  14 tablet  0  . predniSONE (DELTASONE) 10 MG tablet Take 10 mg by mouth daily. 12 day  pack filled on 12-06-11      . traMADol (ULTRAM) 50 MG tablet Take 50-100 mg by mouth every 4 (four) hours as needed. For pain      . warfarin (COUMADIN) 5 MG tablet Take 1 tablet (5 mg total) by mouth daily at 6 PM.  30 tablet  1  . DISCONTD: traMADol (ULTRAM) 50 MG tablet Take 1-2 tablets (50-100 mg total) by mouth every 4  (four) hours as needed.  30 tablet  0    Home:     Functional History:    Functional Status:  Mobility:          ADL:    Cognition:       There were no vitals taken for this visit.  Physical Exam  Constitutional: She is oriented to person, place, and time. She appears well-developed.  HENT:  Head: Normocephalic and atraumatic.  Eyes: Conjunctivae are normal. Pupils are equal, round, and reactive to light.  Neck: Neck supple. No thyromegaly present.  Cardiovascular:  Cardiac rate control  Pulmonary/Chest: Breath sounds normal. She has no wheezes.  Chest tube in place  Abdominal: She exhibits no distension. There is no tenderness.  Musculoskeletal: She exhibits no edema.  Neurological: She is alert and oriented to person, place, and time.  Mood is flat and made good eye contact with examiner. She names person place and situation. She has some difficulty with hospital course. Follows 3 step commands. Right facial droop and tongue deviation. No sensory deficits. Strength grossly 2/5 RUE and 2/5 prox RLE to tr-1 distally. No gross visual spatial deficits. Fair insight, awareness, and memory.  Skin: Skin is warm and dry.  Psychiatric:  Very flat affect  Patient is able to name simple objects. She is able to follow one and two-step commands. She can only remember one out of 3 objects after 2 minute delay. Her immediate recall is also reduced to two out of three objects. Her naming is intact. Results for orders placed during the hospital encounter of 12/14/11 (from the past 48 hour(s))  GLUCOSE, CAPILLARY     Status: Normal   Collection Time   12/31/11  9:00 PM      Component Value Range Comment   Glucose-Capillary 90  70 - 99 (mg/dL)    Comment 1 Documented in Chart      Comment 2 Notify RN     PROTIME-INR     Status: Abnormal   Collection Time   01/01/12  5:00 AM      Component Value Range Comment   Prothrombin Time 16.7 (*) 11.6 - 15.2 (seconds)    INR 1.33  0.00 -  1.49    CBC     Status: Abnormal   Collection Time   01/01/12  5:00 AM      Component Value Range Comment   WBC 8.2  4.0 - 10.5 (K/uL)    RBC 2.78 (*) 3.87 - 5.11 (MIL/uL)    Hemoglobin 8.5 (*) 12.0 - 15.0 (g/dL)    HCT 54.0 (*) 98.1 - 46.0 (%)    MCV 95.0  78.0 - 100.0 (fL)    MCH 30.6  26.0 - 34.0 (pg)    MCHC 32.2  30.0 - 36.0 (g/dL)    RDW 19.1 (*) 47.8 - 15.5 (%)    Platelets 434 (*) 150 - 400 (K/uL)   BASIC METABOLIC PANEL     Status: Normal   Collection Time   01/01/12  5:00 AM  Component Value Range Comment   Sodium 141  135 - 145 (mEq/L)    Potassium 3.6  3.5 - 5.1 (mEq/L)    Chloride 99  96 - 112 (mEq/L)    CO2 29  19 - 32 (mEq/L)    Glucose, Bld 80  70 - 99 (mg/dL)    BUN 10  6 - 23 (mg/dL)    Creatinine, Ser 9.14  0.50 - 1.10 (mg/dL)    Calcium 9.1  8.4 - 10.5 (mg/dL)    GFR calc non Af Amer >90  >90 (mL/min)    GFR calc Af Amer >90  >90 (mL/min)   GLUCOSE, CAPILLARY     Status: Normal   Collection Time   01/01/12  6:34 AM      Component Value Range Comment   Glucose-Capillary 85  70 - 99 (mg/dL)   GLUCOSE, CAPILLARY     Status: Normal   Collection Time   01/01/12 11:28 AM      Component Value Range Comment   Glucose-Capillary 90  70 - 99 (mg/dL)    Comment 1 Notify RN      Comment 2 Documented in Chart     GLUCOSE, CAPILLARY     Status: Normal   Collection Time   01/01/12  4:05 PM      Component Value Range Comment   Glucose-Capillary 97  70 - 99 (mg/dL)    Comment 1 Documented in Chart      Comment 2 Notify RN     GLUCOSE, CAPILLARY     Status: Normal   Collection Time   01/01/12  9:20 PM      Component Value Range Comment   Glucose-Capillary 95  70 - 99 (mg/dL)   PROTIME-INR     Status: Abnormal   Collection Time   01/02/12  5:00 AM      Component Value Range Comment   Prothrombin Time 18.1 (*) 11.6 - 15.2 (seconds)    INR 1.47  0.00 - 1.49    GLUCOSE, CAPILLARY     Status: Normal   Collection Time   01/02/12  6:36 AM      Component Value Range Comment     Glucose-Capillary 89  70 - 99 (mg/dL)   PRO B NATRIURETIC PEPTIDE     Status: Abnormal   Collection Time   01/02/12 10:35 AM      Component Value Range Comment   Pro B Natriuretic peptide (BNP) 1066.0 (*) 0 - 125 (pg/mL)   GLUCOSE, CAPILLARY     Status: Abnormal   Collection Time   01/02/12 11:20 AM      Component Value Range Comment   Glucose-Capillary 124 (*) 70 - 99 (mg/dL)    Dg Chest 2 View  03/03/2955  *RADIOLOGY REPORT*  Clinical Data: Shortness of breath, weakness  CHEST - 2 VIEW  Comparison: Portable chest x-ray of 12/30/2011  Findings: The lungs remain poorly aerated with patchy airspace disease bilaterally.  Cardiomegaly is stable.  The right upper extremity PICC line tip is seen to the expected SVC - RA junction. No pneumothorax is noted.  IMPRESSION: Little change in poor aeration and airspace disease bilaterally.  Original Report Authenticated By: Juline Patch, M.D.   Dg Chest Port 1 View  01/02/2012  *RADIOLOGY REPORT*  Clinical Data: Cough and congestion.  Weakness.  PORTABLE CHEST - 1 VIEW  Comparison: Plain films of the chest 12/30/2011 and 01/01/2012.  Findings: Patchy bilateral airspace disease persists but appears improved on  the left.  No pneumothorax is identified.  Heart size is normal.  There are likely small to moderate bilateral pleural effusions.  Right PICC remains in place.  IMPRESSION: Bilateral airspace disease shows some improvement on the left.  No new abnormality.  Original Report Authenticated By: Bernadene Bell. Maricela Curet, M.D.    Post Admission Physician Evaluation: 1. Functional deficits secondary  to left internal carotid artery infarct, likely embolic from atrial myxoma, status post excision of atrial myxoma . 2. Patient is admitted to receive collaborative, interdisciplinary care between the physiatrist, rehab nursing staff, and therapy team. 3. Patient's level of medical complexity and substantial therapy needs in context of that medical necessity cannot be  provided at a lesser intensity of care such as a SNF. 4. Patient has experienced substantial functional loss from his/her baseline which was documented above under the "Functional History" and "Functional Status" headings.  Judging by the patient's diagnosis, physical exam, and functional history, the patient has potential for functional progress which will result in measurable gains while on inpatient rehab.  These gains will be of substantial and practical use upon discharge  in facilitating mobility and self-care at the household level. 5. Physiatrist will provide 24 hour management of medical needs as well as oversight of the therapy plan/treatment and provide guidance as appropriate regarding the interaction of the two. 6. 24 hour rehab nursing will assist with bladder management, bowel management, safety, skin/wound care, disease management, medication administration and patient education  and help integrate therapy concepts, techniques,education, etc. 7. PT will assess and treat for:  Pre-gait training, gait training, endurance, safety, neuromuscular reeducation.  Goals are: Min to supervision assistance with mobility. 8. OT will assess and treat for: ADLs, cognitive perceptual skills, neuromuscular reeducation, safety, equipment.   Goals are: Supervision upper body and min assist lower body ADLs. 9. SLP will assess and treat for: Decreased attention, concentration, memory.  Goals are: Improve immediate recall to 100%, improve attention and concentration for household tasks. 10. Case Management and Social Worker will assess and treat for psychological issues and discharge planning. 11. Team conference will be held weekly to assess progress toward goals and to determine barriers to discharge. 12.  Patient will receive at least 3 hours of therapy per day at least 5 days per week. 13. ELOS and Prognosis: 3 weeks good   Medical Problem List and Plan: 1. Left middle cerebral artery territory  infarct/left carotid occlusion 2. Myxoma. Status post left atrial myxoma excision 12/24/2011 Followup per cardiovascular surgery  3. DVT Prophylaxis/Anticoagulation: Coumadin per pharmacy. Continue Lovenox for INR greater than 2.00. Monitor platelet counts any signs of bleeding 4. Paroxysmal atrial fibrillation. Amiodarone 400 mg twice a day, Lopressor 100 mg twice a day. Monitor with increased mobility 5. CHF. Lasix 40 mg twice a day. Monitor for any signs of fluid overload. Daily weights 6. Constipation will adjust laxative medications   Yoseline Andersson E 01/02/2012, 4:20 PM

## 2012-01-02 NOTE — Progress Notes (Signed)
ANTICOAGULATION CONSULT NOTE - Initial Consult  Pharmacy Consult for Warfarin Indication: atrial fibrillation  No Known Allergies     Vital Signs: Temp: 98.2 F (36.8 C) (05/09 1316) Temp src: Oral (05/09 1316) BP: 102/63 mmHg (05/09 1404) Pulse Rate: 68  (05/09 1404)  Labs:  Basename 01/02/12 0500 01/01/12 0500 12/31/11 0416  HGB -- 8.5* --  HCT -- 26.4* --  PLT -- 434* --  APTT -- -- --  LABPROT 18.1* 16.7* 13.0  INR 1.47 1.33 0.96  HEPARINUNFRC -- -- --  CREATININE -- 0.72 0.66  CKTOTAL -- -- --  CKMB -- -- --  TROPONINI -- -- --    The CrCl is unknown because both a height and weight (above a minimum accepted value) are required for this calculation.   Medical History: Past Medical History  Diagnosis Date  . Upper respiratory tract infection   . ICAO (internal carotid artery occlusion), left 12/15/2011  . 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 CVA (cerebral infarction) 12/15/2011  . Hypoglycemia 12/14/2011    Medications:  Prescriptions prior to admission  Medication Sig Dispense Refill  . albuterol (PROVENTIL HFA;VENTOLIN HFA) 108 (90 BASE) MCG/ACT inhaler Inhale 2 puffs into the lungs every 6 (six) hours as needed. For wheezing      . amiodarone (PACERONE) 400 MG tablet Take 1 tablet (400 mg total) by mouth 2 (two) times daily after a meal.  60 tablet  1  . aspirin EC 81 MG EC tablet Take 1 tablet (81 mg total) by mouth daily.      . furosemide (LASIX) 40 MG tablet Take 1 tablet (40 mg total) by mouth daily. For 7 days  7 tablet  0  . metoprolol (LOPRESSOR) 100 MG tablet Take 1 tablet (100 mg total) by mouth 2 (two) times daily.  60 tablet  1  . potassium chloride SA (K-DUR,KLOR-CON) 20 MEQ tablet Take 2 tablets (40 mEq total) by mouth daily. For 7 days  14 tablet  0  . predniSONE (DELTASONE) 10 MG tablet Take 10 mg by mouth daily. 12 day pack filled on 12-06-11      . traMADol (ULTRAM) 50 MG tablet Take 50-100 mg by mouth every 4 (four) hours as needed. For pain      .  warfarin (COUMADIN) 5 MG tablet Take 1 tablet (5 mg total) by mouth daily at 6 PM.  30 tablet  1  . DISCONTD: traMADol (ULTRAM) 50 MG tablet Take 1-2 tablets (50-100 mg total) by mouth every 4 (four) hours as needed.  30 tablet  0    Assessment: 61 year old woman s/p cardiac surgery for atrial myxoma who developed A.fib post op.  Dosing schedule on acute stay is as follows: 5/5 = 5mg  5/6= 2.5mg  5/7= 5mg  5.8=5mg  The patient was educated by a pharmacist on 5/7 about warfarin therapy. Goal of Therapy:  INR 2-3  Plan:  Warfarin 5mg  daily. Daily Protimes  Mickeal Skinner 01/02/2012,3:39 PM

## 2012-01-02 NOTE — Progress Notes (Signed)
Occupational Therapy Treatment Patient Details Name: Courtney Faulkner MRN: [id number] DOB: 09-11-1950 Today's Date: 01/02/2012 Time: 5784-6962 OT Time Calculation (min): 27 min  OT Assessment / Plan / Recommendation Comments on Treatment Session Pt progressing! Employed gaze fixation during mobility to dec dizziness. Pt responded well to this. However, HR inc to 158 during ambulation    Follow Up Recommendations  Inpatient Rehab    Barriers to Discharge       Equipment Recommendations  Defer to next venue    Recommendations for Other Services Rehab consult  Frequency     Plan Discharge plan remains appropriate    Precautions / Restrictions Precautions Precautions: Fall Restrictions Weight Bearing Restrictions: No   Pertinent Vitals/Pain Pt with no c/o pain    ADL  Toilet Transfer: Performed;Moderate assistance Toilet Transfer Method: Stand pivot Toilet Transfer Equipment: Bedside commode Toileting - Clothing Manipulation: Performed;Minimal assistance Where Assessed - Glass blower/designer Manipulation: Standing Toileting - Hygiene: Performed;Minimal assistance Where Assessed - Toileting Hygiene: Standing Ambulation Related to ADLs: +2totalA (pt=75%). Pt able to walk much farther today when given visual targets to fixate on as this dec dizziness.  ADL Comments: Continue to employ gaze fixation with any movements    OT Diagnosis:    OT Problem List:   OT Treatment Interventions:     OT Goals ADL Goals ADL Goal: Grooming - Progress: Progressing toward goals ADL Goal: Toilet Transfer - Progress: Progressing toward goals Arm Goals Arm Goal: Additional Goal #1 - Progress: Progressing toward goals Miscellaneous OT Goals OT Goal: Miscellaneous Goal #1 - Progress: Progressing toward goals  Visit Information  Last OT Received On: 01/02/12 Assistance Needed: +2 PT/OT Co-Evaluation/Treatment: Yes    Subjective Data      Prior Functioning       Cognition  Overall  Cognitive Status: Impaired Area of Impairment: Following commands Behavior During Session: Flat affect Current Attention Level: Selective Following Commands: Follows multi-step commands inconsistently    Mobility Bed Mobility Bed Mobility: Sitting - Scoot to Edge of Bed;Supine to Sit Supine to Sit: 3: Mod assist;HOB elevated Sitting - Scoot to [place] of Bed: 4: Min assist Details for Bed Mobility Assistance: cueing for sequence to EOB with limited RUE use and assist of pad to scoot pelvis to edge of surface Transfers Sit to Stand: 3: Mod assist;From bed;From chair/3-in-1 Stand to Sit: To bed;To chair/3-in-1;3: Mod assist Details for Transfer Assistance: Pt with cueing for hand placement, sequence and precautions standing x 2 from bed and x 1 from [facility]. Assist to control descent to surface in standing and cueing and assist to sequence and maintain balance with tranfer bed to [facility] to pt's left   Exercises    Balance Static Sitting Balance Static Sitting - Balance Support: Feet supported;No upper extremity supported Static Sitting - Level of Assistance: 5: Stand by assistance Static Sitting - Comment/# of Minutes: 3 Static Standing Balance Static Standing - Balance Support: Bilateral upper extremity supported Static Standing - Level of Assistance: 4: Min assist Static Standing - Comment/# of Minutes: during functional tasks. Pt initially requiring inc assist to maintain balance until given cues to visually fixate on an object  End of Session OT - End of Session Equipment Utilized During Treatment: Gait belt Activity Tolerance: Treatment limited secondary to medical complications (Comment) (HR inc to 158) Patient left: in chair;with call bell/phone within reach Nurse Communication: Mobility status   Jerzie Bieri 01/02/2012, 2:24 PM

## 2012-01-03 LAB — PROTIME-INR
INR: 1.8 — ABNORMAL HIGH (ref 0.00–1.49)
Prothrombin Time: 21.2 seconds — ABNORMAL HIGH (ref 11.6–15.2)

## 2012-01-03 LAB — GLUCOSE, CAPILLARY: Glucose-Capillary: 85 mg/dL (ref 70–99)

## 2012-01-03 LAB — CBC
HCT: 27.4 % — ABNORMAL LOW (ref 36.0–46.0)
Hemoglobin: 8.5 g/dL — ABNORMAL LOW (ref 12.0–15.0)
MCHC: 31 g/dL (ref 30.0–36.0)
MCV: 94.2 fL (ref 78.0–100.0)
RDW: 16.1 % — ABNORMAL HIGH (ref 11.5–15.5)
WBC: 7.6 10*3/uL (ref 4.0–10.5)

## 2012-01-03 LAB — COMPREHENSIVE METABOLIC PANEL
Albumin: 2.3 g/dL — ABNORMAL LOW (ref 3.5–5.2)
Alkaline Phosphatase: 93 U/L (ref 39–117)
BUN: 10 mg/dL (ref 6–23)
Chloride: 101 mEq/L (ref 96–112)
Creatinine, Ser: 0.73 mg/dL (ref 0.50–1.10)
GFR calc Af Amer: >90 mL/min (ref >90)
GFR calc non Af Amer: >90 mL/min (ref >90)
Glucose, Bld: 92 mg/dL (ref 70–99)
Potassium: 3.4 mEq/L — ABNORMAL LOW (ref 3.5–5.1)
Total Bilirubin: 0.3 mg/dL (ref 0.3–1.2)

## 2012-01-03 LAB — DIFFERENTIAL
Basophils Absolute: 0.1 10*3/uL (ref 0.0–0.1)
Basophils Relative: 1 % (ref 0–1)
Eosinophils Absolute: 0.2 10*3/uL (ref 0.0–0.7)
Monocytes Relative: 9 % (ref 3–12)
Neutro Abs: 3.9 10*3/uL (ref 1.7–7.7)
Neutrophils Relative %: 52 % (ref 43–77)

## 2012-01-03 MED ORDER — AMIODARONE HCL 200 MG PO TABS
200.0000 mg | ORAL_TABLET | Freq: Two times a day (BID) | ORAL | Status: DC
  Administered 2012-01-03 – 2012-01-10 (×14): 200 mg via ORAL
  Filled 2012-01-03 (×16): qty 1

## 2012-01-03 MED ORDER — WARFARIN - PHARMACIST DOSING INPATIENT
Freq: Every day | Status: DC
  Administered 2012-01-16: 18:00:00

## 2012-01-03 NOTE — Progress Notes (Signed)
ANTICOAGULATION CONSULT NOTE - follow up  Pharmacy Consult for Warfarin Indication: atrial fibrillation  No Known Allergies   Height: 5\' 3"  (160 cm) Weight: 195 lb 8.8 oz (88.7 kg) IBW/kg (Calculated) : 52.4  Vital Signs: Temp: 98.7 F (37.1 C) (05/10 0540) Temp src: Oral (05/10 0540) BP: 120/78 mmHg (05/10 0859) Pulse Rate: 86  (05/10 0859)  Labs:  Basename 01/03/12 0540 01/02/12 0500 01/01/12 0500  HGB 8.5* -- 8.5*  HCT 27.4* -- 26.4*  PLT 501* -- 434*  APTT -- -- --  LABPROT 21.2* 18.1* 16.7*  INR 1.80* 1.47 1.33  HEPARINUNFRC -- -- --  CREATININE 0.73 -- 0.72  CKTOTAL -- -- --  CKMB -- -- --  TROPONINI -- -- --    Estimated Creatinine Clearance: 80 ml/min (by C-G formula based on Cr of 0.73).   Assessment: 61 year old woman s/p cardiac surgery for atrial myxoma who developed A.fib post op.  Dosing schedule on acute stay is as follows: 5/5 = 5mg  5/6= 2.5mg  5/7= 5mg  5.8=5mg  INR up to 1.80 today. CBC stable. No bleeding reported. The patient was educated by a pharmacist on 5/7 about warfarin therapy. On LMWH 40 qday until INR > 2. Goal of Therapy:  INR 2-3  Plan:  Continue Warfarin 5mg  daily. Daily Protimes/INR On LMWH 40 qday until INR > 2.0 Herby Abraham, Pharm.D. [phone] 01/03/2012 11:18 AM

## 2012-01-03 NOTE — Progress Notes (Signed)
Patient ID: Courtney Faulkner, female   DOB: April 17, 1951, 61 y.o.   MRN: [id number]  Subjective/Complaints: Constipated no other c/os Review of Systems  Gastrointestinal: Positive for constipation.  Genitourinary: Positive for urgency.    Objective: Vital Signs: Blood pressure 116/72, pulse 83, temperature 98.7 F (37.1 C), temperature source Oral, resp. rate 20, height 5\' 3"  (1.6 m), weight 88.7 kg (195 lb 8.8 oz), SpO2 92.00%. Dg Chest Port 1 View  01/02/2012  *RADIOLOGY REPORT*  Clinical Data: Cough and congestion.  Weakness.  PORTABLE CHEST - 1 VIEW  Comparison: Plain films of the chest 12/30/2011 and 01/01/2012.  Findings: Patchy bilateral airspace disease persists but appears improved on the left.  No pneumothorax is identified.  Heart size is normal.  There are likely small to moderate bilateral pleural effusions.  Right PICC remains in place.  IMPRESSION: Bilateral airspace disease shows some improvement on the left.  No new abnormality.  Original Report Authenticated By: Bernadene Bell. Maricela Curet, M.D.   Results for orders placed during the hospital encounter of 01/02/12 (from the past 72 hour(s))  GLUCOSE, CAPILLARY     Status: Abnormal   Collection Time   01/02/12  5:02 PM      Component Value Range Comment   Glucose-Capillary 65 (*) 70 - 99 (mg/dL)    Comment 1 Notify RN     GLUCOSE, CAPILLARY     Status: Normal   Collection Time   01/02/12  5:37 PM      Component Value Range Comment   Glucose-Capillary 95  70 - 99 (mg/dL)   GLUCOSE, CAPILLARY     Status: Abnormal   Collection Time   01/02/12  9:20 PM      Component Value Range Comment   Glucose-Capillary 101 (*) 70 - 99 (mg/dL)    Comment 1 Notify RN     CBC     Status: Abnormal   Collection Time   01/03/12  5:40 AM      Component Value Range Comment   WBC 7.6  4.0 - 10.5 (K/uL)    RBC 2.91 (*) 3.87 - 5.11 (MIL/uL)    Hemoglobin 8.5 (*) 12.0 - 15.0 (g/dL)    HCT 30.8 (*) 65.7 - 46.0 (%)    MCV 94.2  78.0 - 100.0 (fL)    MCH 29.2   26.0 - 34.0 (pg)    MCHC 31.0  30.0 - 36.0 (g/dL)    RDW 84.6 (*) 96.2 - 15.5 (%)    Platelets 501 (*) 150 - 400 (K/uL)   COMPREHENSIVE METABOLIC PANEL     Status: Abnormal   Collection Time   01/03/12  5:40 AM      Component Value Range Comment   Sodium 140  135 - 145 (mEq/L)    Potassium 3.4 (*) 3.5 - 5.1 (mEq/L)    Chloride 101  96 - 112 (mEq/L)    CO2 27  19 - 32 (mEq/L)    Glucose, Bld 92  70 - 99 (mg/dL)    BUN 10  6 - 23 (mg/dL)    Creatinine, Ser 9.52  0.50 - 1.10 (mg/dL)    Calcium 8.8  8.4 - 10.5 (mg/dL)    Total Protein 6.0  6.0 - 8.3 (g/dL)    Albumin 2.3 (*) 3.5 - 5.2 (g/dL)    AST 29  0 - 37 (U/L)    ALT 44 (*) 0 - 35 (U/L)    Alkaline Phosphatase 93  39 - 117 (U/L)  Total Bilirubin 0.3  0.3 - 1.2 (mg/dL)    GFR calc non Af Amer >90  >90 (mL/min)    GFR calc Af Amer >90  >90 (mL/min)   DIFFERENTIAL     Status: Normal   Collection Time   01/03/12  5:40 AM      Component Value Range Comment   Neutrophils Relative 52  43 - 77 (%)    Neutro Abs 3.9  1.7 - 7.7 (K/uL)    Lymphocytes Relative 36  12 - 46 (%)    Lymphs Abs 2.7  0.7 - 4.0 (K/uL)    Monocytes Relative 9  3 - 12 (%)    Monocytes Absolute 0.7  0.1 - 1.0 (K/uL)    Eosinophils Relative 3  0 - 5 (%)    Eosinophils Absolute 0.2  0.0 - 0.7 (K/uL)    Basophils Relative 1  0 - 1 (%)    Basophils Absolute 0.1  0.0 - 0.1 (K/uL)   PROTIME-INR     Status: Abnormal   Collection Time   01/03/12  5:40 AM      Component Value Range Comment   Prothrombin Time 21.2 (*) 11.6 - 15.2 (seconds)    INR 1.80 (*) 0.00 - 1.49       HEENT: normal Cardio: RRR Resp: CTA B/L GI: BS positive Extremity:  No Edema Skin:   Intact Neuro: Flat, Abnormal Motor 2-/5 in R delt,bi,tri,grip,3/5 R HF,KE,Ankle DF, 5/5 on L side and Abnormal FMC Ataxic/ dec FMC Musc/Skel:  Normal   Assessment/Plan: 1. Functional deficits secondary to L ICA infarct which require 3+ hours per day of interdisciplinary therapy in a comprehensive  inpatient rehab setting. Physiatrist is providing close team supervision and 24 hour management of active medical problems listed below. Physiatrist and rehab team continue to assess barriers to discharge/monitor patient progress toward functional and medical goals. FIM:                   Comprehension Comprehension Mode: Auditory Comprehension: 6-Follows complex conversation/direction: With extra time/assistive device  Expression Expression Mode: Verbal Expression: 5-Expresses basic needs/ideas: With no assist  Social Interaction Social Interaction: 5-Interacts appropriately 90% of the time - Needs monitoring or encouragement for participation or interaction.  Problem Solving Problem Solving: 5-Solves basic problems: With no assist  Memory Memory: 6-More than reasonable amt of time  2. Anticoagulation/DVT prophylaxis with Pharmaceutical: Coumadin 3. Pain Management: acetominophen Medical Problem List and Plan:  1. 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 Myxoma. 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 DVT Prophylaxis/Anticoagulation: Coumadin per pharmacy. Continue Lovenox for INR greater than 2.00. Monitor platelet counts any signs of bleeding  4. Paroxysmal atrial fibrillation. Amiodarone 400 mg twice a day, Lopressor 100 mg twice a day. Monitor with increased mobility  5. CHF. Lasix 40 mg twice a day. Monitor for any signs of fluid overload. Daily weights  6. Constipation will adjust laxative medications   LOS (Days) 1 A FACE TO FACE EVALUATION WAS PERFORMED  Quince Santana E 01/03/2012, 7:47 AM

## 2012-01-03 NOTE — Progress Notes (Signed)
Patient information reviewed and entered into [employer] system by Elisse Pennick, RN, CRRN, PPS Coordinator.  Information including medical coding and functional independence measure will be reviewed and updated through discharge.    

## 2012-01-03 NOTE — Progress Notes (Signed)
Inpatient Rehabilitation Center Individual Statement of Services  Patient Name:  Courtney Faulkner  Date:  01/03/2012  Welcome to the Inpatient Rehabilitation Center.  Our goal is to provide you with an individualized program based on your diagnosis and situation, designed to meet your specific needs.  With this comprehensive rehabilitation program, you will be expected to participate in at least 3 hours of rehabilitation therapies Monday-Friday, with modified therapy programming on the weekends.  Your rehabilitation program will include the following services:  Physical Therapy (PT), Occupational Therapy (OT), Speech Therapy (ST), 24 hour per day rehabilitation nursing, Therapeutic Recreaction (TR), Case Management (RN and Child psychotherapist), Rehabilitation Medicine, Nutrition Services and Pharmacy Services  Weekly team conferences will be held on Wednesdays to discuss your progress.  Your RN Case Designer, television/film set will talk with you frequently to get your input and to update you on team discussions.  Team conferences with you and your family in attendance may also be held.  Expected length of stay: 2 weeks  Overall anticipated outcome: modified independent  Depending on your progress and recovery, your program may change.  Your RN Case Estate agent will coordinate services and will keep you informed of any changes.  Your RN Sports coach and SW names and contact numbers are listed  below.  The following services may also be recommended but are not provided by the Inpatient Rehabilitation Center:   Driving Evaluations  Home Health Rehabiltiation Services  Outpatient Rehabilitatation [facility]  Vocational Rehabilitation   Arrangements will be made to provide these services after discharge if needed.  Arrangements include referral to agencies that provide these services.  Your insurance has been verified to be:  BCBS Your primary doctor is:  __________________  Pertinent  information will be shared with your doctor and your insurance company.  Case Manager: Lutricia Horsfall, [facility] [phone]  Social Worker:  Dossie Der, [place] [phone]  Information discussed with and copy given to patient by: Meryl Dare, 01/03/2012

## 2012-01-03 NOTE — Evaluation (Signed)
Occupational Therapy Assessment and Plan  Patient Details  Name: Courtney Faulkner MRN: [id number] Date of Birth: 10-Dec-1950  OT Diagnosis: hemiplegia affecting dominant side Rehab Potential:   ELOS:     Today's Date: 01/03/2012 Time: 4098-1191 Time Calculation (min): 28 min  Problem List:  Patient Active Problem List  Diagnoses  . Hypoglycemia  . Anemia  . Atrial myxoma  . CVA (cerebral infarction)  . ICAO (internal carotid artery occlusion), left  . Stroke    Past Medical History:  Past Medical History  Diagnosis Date  . Upper respiratory tract infection   . ICAO (internal carotid artery occlusion), left 12/15/2011  . 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 CVA (cerebral infarction) 12/15/2011  . Hypoglycemia 12/14/2011   Past Surgical History:  Past Surgical History  Procedure Date  . 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 Admitted 12/14/2011 with unremarkable past medical history presented with altered mental status and slurred speech. EMS was called by family with noted blood sugar of 34. She was given an amp of D50 with repeat blood sugars of 54. She had initial cranial CT scan that was negative. MRI of the brain showed moderate size acute left hemispheric infarction appeared to be secondary to acute left ICA carotid artery occlusion. CT angiogram head shows acute nonhemorrhagic infarct left basal ganglia and corona radiata. CT angiogram neck with abnormal appearance of the left ICA artery with appearance of dissection with complete occlusion at the C2 level. Carotid Dopplers showed occluded left ICA. Echocardiogram with ejection fraction 65% with no source of embolus. Noted left atrial mass attached to intra-atrial septum highly suggestive of myxoma. TEE identifies 1.7 x 1.7 cm mass left  atrial septum again felt to be atrial myxoma. Cardiac catheterization 12/18/2011 with normal coronary arteries. Underwent excision of left atrial myxoma 12/24/2011 per Dr. Barry Dienes. Subcutaneous Lovenox initiated for deep vein thrombosis prophylaxis. Patient developed paroxysmal atrial fibrillation with followup cardiology services and placed on amiodarone therapy as well as Coumadin. She currently remains on Lovenox until INR greater than 2.00. Patient transferred to CIR on 01/02/2012 .   Patient currently requires mod with basic self-care skills secondary to decreased motor planning.  Prior to hospitalization, patient could complete BADL/ and IADLwith no assistance.  Patient will benefit from skilled intervention to increase level of independence with iADL prior to discharge home independently.  Anticipate patient will require intermittent supervision and follow up home health and follow up outpatient.   OT Evaluation Precautions/Restrictions motor planning problems      Vital Signs Oxygen Therapy SpO2: 96 % O2 Device: None (Room air) Pain  none   Home Living/Prior Functioning Home Living Lives With: Spouse Available Help at Discharge: Family Type of Home: House Home Access: Stairs to enter Secretary/administrator of Steps: 3 Entrance Stairs-Rails: None Home Layout: Two level;Bed/bath upstairs;Able to live on main level with bedroom/bathroom Alternate Level Stairs-Number of Steps: 12 Alternate Level Stairs-Rails: Right;Left Bathroom Shower/Tub: Health visitor: Standard Bathroom Accessibility: Yes How Accessible: Accessible via walker Home Adaptive Equipment: None IADL History Homemaking Responsibilities: Yes Meal Prep Responsibility: Primary Laundry Responsibility: Primary Cleaning Responsibility: Primary Shopping Responsibility: Primary Current License: Yes Mode of Transportation: Car Occupation: Full time employment Prior Function Level of Independence:  Independent with transfers;Independent with gait;Independent with homemaking with ambulation Able to Take Stairs?: Yes Driving:  Yes Vocation: Full time employment 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[employer], 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; 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    Vision/Perception:  Unable to locate some grooming items      Sensation:  wfl   Motor:  RUE impaired        Trunk/Postural Assessment wfl    Balance Standardized Balance Assessment Standardized Balance Assessment: Hospital doctor Berg Balance Test Sit to Stand: Needs moderate or maximal assist to stand Standing Unsupported: Able to stand 2 minutes with supervision Sitting with Back Unsupported but Feet Supported on Floor or Stool: Able to sit safely and securely 2 minutes Stand to Sit: Needs assistance to sit Transfers: Needs one person to assist Standing Unsupported with Eyes Closed: Able to stand 10 seconds with supervision Standing Ubsupported with Feet Together: Needs help to attain position but able to stand for 30 seconds with feet together From Standing, Reach Forward with Outstretched Arm: Can reach forward >12 cm safely (5") From Standing Position, Pick up Object from Floor: Able to pick up shoe, needs supervision From Standing Position, Turn to Look Behind Over each Shoulder: Needs assist to keep from losing balance and falling Turn 360 Degrees: Needs assistance while turning Standing Unsupported, Alternately Place Feet on Step/Stool: Needs assistance to keep from falling or unable to try Standing Unsupported, One Foot in Front: Loses balance while stepping or standing Standing on One Leg: Unable to try or needs assist to prevent fall Total Score: 18  Extremity/Trunk Assessment      See FIM for current functional status Refer to Care Plan for Long Term Goals  Recommendations for other services: None  Discharge Criteria: Patient will be discharged from OT if patient  refuses treatment 3 consecutive times without medical reason, if treatment goals not met, if there is a change in medical status, if patient makes no progress towards goals or if patient is discharged from hospital.  The above assessment, treatment plan, treatment alternatives and goals were discussed and mutually agreed upon: by patient  Humberto Seals 01/03/2012, 5:39 PM

## 2012-01-03 NOTE — Progress Notes (Addendum)
Patient Name: Courtney Faulkner Date of Encounter: 01/03/2012    SUBJECTIVE: No significant cardiac complaints.   TELEMETRY:  Not on tele Filed Vitals:   01/03/12 0100 01/03/12 0540 01/03/12 0803 01/03/12 0859  BP:  116/72 118/76 120/78  Pulse: 77 83 84 86  Temp:  98.7 F (37.1 C)    TempSrc:  Oral    Resp:  20    Height:      Weight:  88.7 kg (195 lb 8.8 oz)    SpO2: 92% 92%      Intake/Output Summary (Last 24 hours) at 01/03/12 1240 Last data filed at 01/03/12 1000  Gross per 24 hour  Intake    480 ml  Output      1 ml  Net    479 ml    LABS: Basic Metabolic Panel:  Basename 01/03/12 0540 01/01/12 0500  NA 140 141  K 3.4* 3.6  CL 101 99  CO2 27 29  GLUCOSE 92 80  BUN 10 10  CREATININE 0.73 0.72  CALCIUM 8.8 9.1  MG -- --  PHOS -- --   CBC:  Basename 01/03/12 0540 01/01/12 0500  WBC 7.6 8.2  NEUTROABS 3.9 --  HGB 8.5* 8.5*  HCT 27.4* 26.4*  MCV 94.2 95.0  PLT 501* 434*    Radiology/Studies:  No new  Physical Exam: Blood pressure 120/78, pulse 86, temperature 98.7 F (37.1 C), temperature source Oral, resp. rate 20, height 5\' 3"  (1.6 m), weight 88.7 kg (195 lb 8.8 oz), SpO2 92.00%. Weight change:    S4  ASSESSMENT:  1. Paroxysmal atrial fibrillation 2. Amiodarone therapy   Plan:  1. On 01/10/12 we must decrease the AMIODARONE TO 200 MG DAILY  Signed, Lesleigh Noe 01/03/2012, 12:40 PM

## 2012-01-03 NOTE — Evaluation (Addendum)
Physical Therapy Assessment and Plan  Patient Details  Name: Courtney Faulkner MRN: [id number] Date of Birth: 1951/06/15  PT Diagnosis: Difficulty walking, Hemiplegia dominant and Muscle weakness Rehab Potential: Good ELOS: 2 weeks   Today's Date: 01/03/2012 Time: 1103-1200 and 1308-6578 Time Calculation (min): 57 min and 28 min  Problem List:  Patient Active Problem List  Diagnoses  . Hypoglycemia  . Anemia  . Atrial myxoma  . CVA (cerebral infarction)  . ICAO (internal carotid artery occlusion), left  . Stroke    Past Medical History:  Past Medical History  Diagnosis Date  . Upper respiratory tract infection   . ICAO (internal carotid artery occlusion), left 12/15/2011  . 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 CVA (cerebral infarction) 12/15/2011  . Hypoglycemia 12/14/2011   Past Surgical History:  Past Surgical History  Procedure Date  . 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 Admitted 12/14/2011 with unremarkable past medical history presented with altered mental status and slurred speech. EMS was called by family with noted blood sugar of 34. She was given an amp of D50 with repeat blood sugars of 54. She had initial cranial CT scan that was negative. MRI of the brain showed moderate size acute left hemispheric infarction appeared to be secondary to acute left ICA carotid artery occlusion. CT angiogram head shows acute nonhemorrhagic infarct left basal ganglia and corona radiata. CT angiogram neck with abnormal appearance of the left ICA artery with appearance of dissection with complete occlusion at the C2 level. Carotid Dopplers showed occluded left ICA. Echocardiogram with ejection fraction 65% with no source of embolus. Noted left atrial mass attached to intra-atrial septum highly suggestive of myxoma. TEE  identifies 1.7 x 1.7 cm mass left atrial septum again felt to be atrial myxoma. Cardiac catheterization 12/18/2011 with normal coronary arteries. Underwent excision of left atrial myxoma 12/24/2011 per Dr. Barry Dienes.  Subcutaneous Lovenox initiated for deep vein thrombosis prophylaxis. Patient developed paroxysmal atrial fibrillation with followup cardiology services and placed on amiodarone therapy as well as Coumadin. She currently remains on Lovenox until INR greater than 2.00. Patient transferred to CIR on 01/02/2012 .   Patient currently requires max with mobility secondary to muscle weakness, decreased cardiorespiratoy endurance and decreased oxygen support, decreased coordination and decreased motor planning and decreased standing balance, hemiplegia and decreased balance strategies.  Prior to hospitalization, patient was independent with mobility and lived with Spouse in a House home.  Home access is 3Stairs to enter.  Patient will benefit from skilled PT intervention to maximize safe functional mobility, minimize fall risk and decrease caregiver burden for planned discharge home with intermittent assist.  Anticipate patient will HHPT vs outpatient PT/cardiac rehab at discharge.  PT - End of Session Activity Tolerance: Decreased this session Endurance Deficit: Yes Endurance Deficit Description: significant fatigue, low Sp02 on RA: required 2L 02 to maintain Sp02 >90% with activity PT Assessment Rehab Potential: Good Barriers to Discharge: None PT Plan PT Frequency: Total of 15 hours over 7 days Frequency due to: significant fatigue, low Sp02 on RA: required 2L 02 to maintain Sp02 >90% with activity Estimated Length of Stay: 2 weeks PT Treatment/Interventions: Ambulation/gait training;Balance/vestibular training;Discharge planning;DME/adaptive equipment instruction;Functional electrical stimulation;Functional mobility training;Neuromuscular re-education;Patient/family  education;Splinting/orthotics;Stair training;Therapeutic Activities;Therapeutic Exercise;UE/LE Strength taining/ROM;UE/LE Coordination activities;Wheelchair propulsion/positioning PT Recommendation Follow Up Recommendations: Home health PT;Outpatient PT Equipment Recommended: Rolling  walker with 5" wheels;Wheelchair (measurements);Wheelchair cushion (measurements)  PT Evaluation Precautions/Restrictions Precautions Precautions: Fall Precaution Comments: Maintain Sp02 >90% on 2L 02 [place]; monitor vitals Restrictions Weight Bearing Restrictions: No Vital Signs Therapy Vitals Pulse Rate: 80  BP: 116/84 mmHg (118/72 standing) Patient Position, if appropriate: Sitting Oxygen Therapy SpO2: 79 % (PA notified, 2L 02 applied 99% with 2L) O2 Device: None (Room air) Pulse Oximetry Type: Intermittent Pain Pain Assessment Pain Assessment: No/denies pain Home Living/Prior Functioning Home Living Lives With: Spouse Available Help at Discharge: Family Type of Home: House Home Access: Stairs to enter Secretary/administrator of Steps: 3 Entrance Stairs-Rails: None Home Layout: Two level;Bed/bath upstairs;Able to live on main level with bedroom/bathroom Alternate Level Stairs-Number of Steps: 12 Alternate Level Stairs-Rails: Right;Left Home Adaptive Equipment: None Prior Function Level of Independence: Independent with transfers;Independent with gait;Independent with homemaking with ambulation Able to Take Stairs?: Yes Driving: Yes Vocation: Full time employment 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[employer], 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; 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 Vision/Perception  Vision - History Patient Visual Report: Blurring of vision Perception Perception: Impaired (Could not distinguish R from LLE during sensory testing) Praxis Praxis: Impaired Praxis Impairment Details: Ideomotor Praxis-Other Comments: Difficulty sequencing w/c propulsion   Cognition Overall Cognitive Status: Impaired Arousal/Alertness: Awake/alert Orientation Level: Oriented to time;Oriented to place;Oriented to person;Other (comment) (NEEDED CUES FOR ACCURATE LANGUAGE) Attention: Sustained Sustained Attention: Appears intact Memory: Impaired Memory Impairment: Decreased recall of new information;Prospective memory;Decreased short term memory;Retrieval deficit Decreased Short Term Memory: Verbal basic Awareness: Impaired Awareness Impairment: Intellectual impairment;Anticipatory impairment;Emergent impairment Problem Solving: Impaired Problem Solving Impairment: Functional complex;Verbal complex Executive Function: Self Correcting;Organizing;Decision Making Organizing: Impaired Organizing Impairment: Functional complex;Verbal complex Decision Making: Impaired Decision Making Impairment: Verbal complex;Functional complex Self Correcting: Impaired Self Correcting Impairment: Verbal complex;Functional complex Safety/Judgment: Impaired Sensation Sensation Light Touch: Appears Intact Additional Comments: Appears intact but patient unable to distinguish R from L during testing Coordination Gross Motor Movements are Fluid and Coordinated: No Coordination and Movement Description: Limited by weakness Motor  Motor Motor: Hemiplegia Motor - Skilled Clinical Observations: R hemiplegia  Mobility Bed Mobility Bed Mobility: Supine to Sit;Sit to Supine Supine to Sit: HOB flat;3: Mod assist Sit to Supine: 4: Min assist Sit to Supine - Details (indicate cue type and reason): Supine > sit with mod A to sit fully upright EOB with c/o dizziness; sit > supine with min A to reposition in bed but patient able to lift RLE into bed.   Transfers Stand Pivot Transfers: 2: Max assist;With Sales promotion account executive Details (indicate cue type and reason): Max A for lifting and lowering assistance with cues for hand placement and for full pivot; patient reporting  dizziness with changes in position. Locomotion  Ambulation Ambulation/Gait Assistance: 3: Mod assist Ambulation Distance (Feet): 5 Feet Assistive device: Parallel bars Ambulation/Gait Assistance Details: Once 2L via [place] applied patient Sp02 remained 99% with no reports of dizziness in standing; HR remained 80 bpm.  Able to ambulated 5' forwards and retro stepping with verbal and tactile cues for lateral and anterior/posterior weight shifting for sequencing; demonstrated good stability on RLE but slightly decreased R foot clearance. Corporate treasurer: Yes Wheelchair Assistance: 1: +1 Total Education officer, museum: Both upper extremities Wheelchair Parts Management: Needs assistance Distance: Attempted w/c mobility training with hand over hand cues for UE propulsion sequence but unable to maintain straight path; total A 150 on unit   Did not attempt stairs secondary to significant fatigue and  dizziness  Trunk/Postural Assessment  Cervical Assessment Cervical Assessment: Within Functional Limits Thoracic Assessment Thoracic Assessment: Within Functional Limits Lumbar Assessment Lumbar Assessment: Within Functional Limits Postural Control Postural Control: Within Functional Limits  Balance Static Sitting Balance Static Sitting - Balance Support: No upper extremity supported;Feet supported Static Sitting - Level of Assistance: 5: Stand by assistance Dynamic Sitting Balance Dynamic Sitting - Balance Support: Bilateral upper extremity supported;Feet supported Dynamic Sitting - Level of Assistance: 4: Min assist (secondary to dizziness) Static Standing Balance Static Standing - Balance Support: Bilateral upper extremity supported Static Standing - Level of Assistance: 3: Mod assist Static Standing - Comment/# of Minutes: To assess BP; mod A secondary to dizziness  PM session: Patient performed sup > sit L side EOB with mod-max A and bed rail; performed Berg  balance test with multiple sitting rest breaks secondary to significant fatigue; Sp02 remained >95% on 2L02 and HR 79-84 bpm with standing activity.  See navigator for individual tasks.  Patient demonstrates increased fall risk as noted by score of 18/56 on Berg Balance Scale.  (<36= high risk for falls, close to 100%; 37-45 significant >80%; 46-51 moderate >50%; 52-55 lower >25%).  Unable to complete last 5 tasks secondary to significant fatigue; returned to bed and supine with max A.  Extremity Assessment  RLE Assessment RLE Assessment: Exceptions to [facility] RLE Strength RLE Overall Strength: Deficits RLE Overall Strength Comments: 2+/5 throughout LLE Assessment LLE Assessment: Exceptions to [facility] LLE Strength LLE Overall Strength: Deficits LLE Overall Strength Comments: 4+/5 throughout except 3/5 hip flexion  See FIM for current functional status Refer to Care Plan for Long Term Goals  Recommendations for other services: None  Discharge Criteria: Patient will be discharged from PT if patient refuses treatment 3 consecutive times without medical reason, if treatment goals not met, if there is a change in medical status, if patient makes no progress towards goals or if patient is discharged from hospital.  The above assessment, treatment plan, treatment alternatives and goals were discussed and mutually agreed upon: by patient and by family  Edman Circle Faucette 01/03/2012, 1:21 PM

## 2012-01-03 NOTE — Evaluation (Signed)
Speech Language Pathology Assessment and Plan  Patient Details  Name: Courtney Faulkner MRN: [id number] Date of Birth: Jun 23, 1951  SLP Diagnosis: cognitive-linguistic impairments Rehab Potential: Good ELOS: 14-20 DAYS?   Today's Date: 01/03/2012 Time: 1103-1200 Time Calculation (min): 57 min  Problem List:  Patient Active Problem List  Diagnoses  . Hypoglycemia  . Anemia  . Atrial myxoma  . CVA (cerebral infarction)  . ICAO (internal carotid artery occlusion), left  . Stroke    Past Medical History:  Past Medical History  Diagnosis Date  . Upper respiratory tract infection   . ICAO (internal carotid artery occlusion), left 12/15/2011  . 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 CVA (cerebral infarction) 12/15/2011  . Hypoglycemia 12/14/2011   Past Surgical History:  Past Surgical History  Procedure Date  . 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 Admitted 12/14/2011 with unremarkable past medical history presented with altered mental status and slurred speech. EMS was called by family with noted blood sugar of 34. She was given an amp of D50 with repeat blood sugars of 54. She had initial cranial CT scan that was negative. MRI of the brain showed moderate size acute left hemispheric infarction appeared to be secondary to acute left ICA carotid artery occlusion. CT angiogram head shows acute nonhemorrhagic infarct left basal ganglia and corona radiata. CT angiogram neck with abnormal appearance of the left ICA artery with appearance of dissection with complete occlusion at the C2 level. Carotid Dopplers showed occluded left ICA. Echocardiogram with ejection fraction 65% with no source of embolus. Noted left atrial mass attached to intra-atrial septum highly suggestive of myxoma. TEE identifies 1.7 x 1.7 cm mass left atrial  septum again felt to be atrial myxoma. Cardiac catheterization 12/18/2011 with normal coronary arteries. Underwent excision of left atrial myxoma 12/24/2011 per Dr. Barry Dienes. Subcutaneous Lovenox initiated for deep vein thrombosis prophylaxis. Patient developed paroxysmal atrial fibrillation with followup cardiology services and placed on amiodarone therapy as well as Coumadin. She currently remains on Lovenox until INR greater than 2.00. Patient transferred to CIR on 01/02/2012 .       Pt. demonstrates both cognitive and linguistic impairments specifically in the areas of working memory, awareness, problem solving, and higher level attention.  Expressive language deficits have improved since this initial assessment 12/16/11.  She presently exhibits paraphasias at the conversational level with reduced self awareness and ability to correct, although this is much improved.  Pt. Would benefit from skilled ST services to maximize independence with the least assist level to return to premorbid ADL's if possible and desired.     SLP - End of Session Activity Tolerance: Improving Patient left: in chair;with call bell/phone within reach Assessment SLP Recommendation/Assessment: Patient will need skilled Speech Lanaguage Pathology Services during CIR admission Rehab Potential: Good Barriers to Discharge: None Therapy Diagnosis: Cognitive Impairments;Speech and Language deficits SLP Plan SLP Frequency: 5 out of 7 days Estimated Length of Stay: 14-20 DAYS? SLP Treatment/Interventions: Cognitive remediation/compensation;Functional tasks;Internal/external aids;Speech/Language facilitation Recommendation Follow up Recommendations: Other (comment) (TBD) Equipment Recommended: None recommended by SLP    Courtney FIM for current functional status Refer to Care Plan for Long Term Goals  Recommendations for other services: None  Discharge Criteria: Patient will be discharged from SLP if patient refuses treatment 3  consecutive times without medical reason, if treatment goals not met, if there  is a change in medical status, if patient makes no progress towards goals or if patient is discharged from hospital.  The above assessment, treatment plan, treatment alternatives and goals were discussed and mutually agreed upon: by patient  Darrow Bussing.Ed [employer] [phone]  01/03/2012 .

## 2012-01-04 DIAGNOSIS — I6330 Cerebral infarction due to thrombosis of unspecified cerebral artery: Secondary | ICD-10-CM

## 2012-01-04 DIAGNOSIS — G8110 Spastic hemiplegia affecting unspecified side: Secondary | ICD-10-CM

## 2012-01-04 DIAGNOSIS — Z5189 Encounter for other specified aftercare: Secondary | ICD-10-CM

## 2012-01-04 LAB — GLUCOSE, CAPILLARY
Glucose-Capillary: 76 mg/dL (ref 70–99)
Glucose-Capillary: 102 mg/dL — ABNORMAL HIGH (ref 70–99)

## 2012-01-04 LAB — PROTIME-INR
INR: 2.06 — ABNORMAL HIGH (ref 0.00–1.49)
Prothrombin Time: 23.6 seconds — ABNORMAL HIGH (ref 11.6–15.2)

## 2012-01-04 MED ORDER — WARFARIN SODIUM 5 MG PO TABS
5.0000 mg | ORAL_TABLET | Freq: Every day | ORAL | Status: AC
  Administered 2012-01-04: 5 mg via ORAL
  Filled 2012-01-04: qty 1

## 2012-01-04 NOTE — Progress Notes (Signed)
Patient ID: Courtney Faulkner, female   DOB: [birth year], 61 y.o.   MRN: [id number] Subjective/Complaints: No specific complaints today. Review of Systems  Constitutional: Positive for malaise/fatigue.    Objective: Vital Signs: Blood pressure 102/65, pulse 58, temperature 97.9 F (36.6 C), temperature source Oral, resp. rate 18, height 5\' 3"  (1.6 m), weight 193 lb 12.6 oz (87.9 kg), SpO2 98.00%.  Physical exam: No acute distress. Neck is supple. Chest clear to auscultation cardiac exam S1-S2 are regular. Abdominal exam active bowel sounds, soft. Extremities no edema.  Assessment/Plan: 1. Functional deficits secondary to L ICA infarct which require 3+ hours per day of interdisciplinary therapy in a comprehensive inpatient rehab setting. Physiatrist is providing close team supervision and 24 hour management of active medical problems listed below. Physiatrist and rehab team continue to assess barriers to discharge/monitor patient progress toward functional and medical goals. FIM: FIM - Bathing Bathing Steps Patient Completed: Chest;Right Arm;Left Arm;Abdomen;Front perineal area;Right upper leg;Left upper leg Bathing: 3: Mod-Patient completes 5-7 44f 10 parts or 50-74%  FIM - Upper Body Dressing/Undressing Upper body dressing/undressing steps patient completed: Thread/unthread right bra strap;Thread/unthread left bra strap;Thread/unthread right sleeve of pullover shirt/dresss;Thread/unthread left sleeve of pullover shirt/dress;Put head through opening of pull over shirt/dress;Pull shirt over trunk Upper body dressing/undressing: 4: Min-Patient completed 75 plus % of tasks FIM - Lower Body Dressing/Undressing Lower body dressing/undressing steps patient completed: Pull pants up/down Lower body dressing/undressing: 1: Total-Patient completed less than 25% of tasks  FIM - Toileting Toileting steps completed by patient:  (Poor peri hygiene from previous toileting) Toileting: 0: Activity did not  occur (pt reported she got on toilet earlier and had BM.  )  FIM - Toilet Transfers Toilet Transfers: 3-To toilet/BSC: Mod A (lift or lower assist)  FIM - Banker Devices: Arm rests Bed/Chair Transfer: 3: Bed > Chair or W/C: Mod A (lift or lower assist);3: Chair or W/C > Bed: Mod A (lift or lower assist);3: Supine > Sit: Mod A (lifting assist/Pt. 50-74%/lift 2 legs  FIM - Locomotion: Wheelchair Distance: Attempted w/c mobility training with hand over hand cues for UE propulsion sequence but unable to maintain straight path; total A 150 on unit Locomotion: Wheelchair: 1: Total Assistance/staff pushes wheelchair (Pt<25%) FIM - Locomotion: Ambulation Locomotion: Ambulation Assistive Devices: Designer, industrial/product Ambulation/Gait Assistance: 4: Min assist Locomotion: Ambulation: 1: Travels less than 50 ft with minimal assistance (Pt.>75%)  Comprehension Comprehension Mode: Auditory Comprehension: 5-Understands complex 90% of the time/Cues < 10% of the time  Expression Expression Mode: Verbal Expression: 4-Expresses basic 75 - 89% of the time/requires cueing 10 - 24% of the time. Needs helper to occlude trach/needs to repeat words.  Social Interaction Social Interaction Mode: Not assessed Social Interaction: 5-Interacts appropriately 90% of the time - Needs monitoring or encouragement for participation or interaction.  Problem Solving Problem Solving: 4-Solves basic 75 - 89% of the time/requires cueing 10 - 24% of the time  Memory Memory: 4-Recognizes or recalls 75 - 89% of the time/requires cueing 10 - 24% of the time  2. Anticoagulation/DVT prophylaxis with Pharmaceutical: Coumadin 3. Pain Management: acetominophen Medical Problem List and Plan:  1. 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 Myxoma. 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 DVT  Prophylaxis/Anticoagulation: Coumadin per pharmacy. Continue Lovenox for INR greater than 2.00. Monitor platelet counts any signs of bleeding  4. Paroxysmal atrial fibrillation. Amiodarone 400 mg twice a day, Lopressor 100  mg twice a day. Monitor with increased mobility  5. CHF. Lasix 40 mg twice a day. Monitor for any signs of fluid overload. Daily weights  6. Constipation will adjust laxative medications   LOS (Days) 2 A FACE TO FACE EVALUATION WAS PERFORMED  Courtney Faulkner 01/04/2012, 5:09 PM

## 2012-01-04 NOTE — Progress Notes (Signed)
Physical Therapy Note  Patient Details  Name: Courtney Faulkner MRN: [id number] Date of Birth: 27-May-1951 Today's Date: 01/04/2012  1430-1525 (55 minutes) individual Pain: no complaint of pain Focus of treatment: therapeutic exercises to improve cardiovascular tolerance to activity; gait training /endurance Treatment: Oxygen sats (resting) > 92% 2 L [place] pulse 74. / :NuStep Level 1  X 2 minutes X 2 ; gait 10 feet X 2 RW min assist. Distance limited by fatigue.(Oxygen Sats > 92% 2L [place]).   (Eleisha Branscomb,JIM 01/04/2012, 2:57 PM

## 2012-01-04 NOTE — Progress Notes (Signed)
Speech Language Pathology Daily Session Note  Patient Details  Name: Courtney Faulkner MRN: [id number] Date of Birth: 1951-03-02  Today's Date: 01/04/2012 Time: 1050-1130 Time Calculation (min): 40 min  Short Term Goals: Week 1: SLP Short Term Goal 1 (Week 1): Pt. will use strategies to increase accuracy during description and explanations of complex information with min verbal cueing. SLP Short Term Goal 2 (Week 1): Pt. will increase intellectual awareness by stating details of diagnosis and medical interventions with mod verbal cues. SLP Short Term Goal 3 (Week 1): Pt. will initiate and self correct paraphasias in conversation with min verbal question cues. SLP Short Term Goal 4 (Week 1): Pt. will implement strategies to facilitate working and prospective memory with min verbal cues.  Skilled Therapeutic Interventions: Session focused on skilled treatment of language and cognition; SLP facilitated session with max assist semantic and visual cues to self monitor verbal expression with semantic paraphasias; Patient demonstrates good intellectual awareness of errors; however, continues to require significant cues to self monitor.  Patient was unable to recall word finding strategies at end of session, which she was educated on at the beginning of the session and SLP suspects that while memory deficits may be present patient's overall arousal, attention are impacting her ability to attend to new information and therefore recall it.   SLP educated patient on attention's impact and provided her with written aid, as well as added a sustained attention goal to this reporting period.   Daily Session Precautions/Restrictions  Restrictions Weight Bearing Restrictions: No FIM:  Comprehension Comprehension Mode: Auditory Comprehension: 5-Understands complex 90% of the time/Cues < 10% of the time Expression Expression Mode: Verbal Expression: 4-Expresses basic 75 - 89% of the time/requires cueing 10 -  24% of the time. Needs helper to occlude trach/needs to repeat words. Social Interaction Social Interaction: 5-Interacts appropriately 90% of the time - Needs monitoring or encouragement for participation or interaction. Problem Solving Problem Solving: 4-Solves basic 75 - 89% of the time/requires cueing 10 - 24% of the time Memory Memory: 4-Recognizes or recalls 75 - 89% of the time/requires cueing 10 - 24% of the time General    Pain Pain Assessment Pain Assessment: No/denies pain Pain Score: 0-No pain  Therapy/Group: Individual Therapy  Charlane Ferretti., CCC-SLP [phone]  Courtney Faulkner 01/04/2012, 1:14 PM

## 2012-01-04 NOTE — Progress Notes (Signed)
ANTICOAGULATION CONSULT NOTE - Follow Up Consult  Pharmacy Consult:  Coumadin Indication: Atrial fibrillation  No Known Allergies  Patient Measurements: Height: 5\' 3"  (160 cm) Weight: 193 lb 12.6 oz (87.9 kg) IBW/kg (Calculated) : 52.4   Vital Signs: Temp: 97.9 F (36.6 C) (05/11 0530) Temp src: Oral (05/11 0530) BP: 102/65 mmHg (05/11 0530) Pulse Rate: 58  (05/11 0530)  Labs:  Basename 01/04/12 0540 01/03/12 0540 01/02/12 0500  HGB -- 8.5* --  HCT -- 27.4* --  PLT -- 501* --  APTT -- -- --  LABPROT 23.6* 21.2* 18.1*  INR 2.06* 1.80* 1.47  HEPARINUNFRC -- -- --  CREATININE -- 0.73 --  CKTOTAL -- -- --  CKMB -- -- --  TROPONINI -- -- --    Estimated Creatinine Clearance: 79.6 ml/min (by C-G formula based on Cr of 0.73).     Assessment: 18 YOF with Afib s/p cardiac surgery for atrial myxoma and to continue on Coumadin therapy.  INR therapeutic today, no bleeding documented.   Goal of Therapy:  INR 2 - 3   Plan:  - Coumadin 5mg  PO daily at 1800 for now - D/C Lovenox as INR therapeutic - Daily PT / INR - Consider increasing KCL supplementation dose     Weyman Bogdon D. Laney Potash, PharmD, BCPS Pager:  [phone] 01/04/2012, 2:17 PM

## 2012-01-04 NOTE — Progress Notes (Signed)
Occupational Therapy Session Note  Patient Details  Name: Courtney Faulkner MRN: [id number] Date of Birth: March 21, 1951  Today's Date: 01/04/2012 Time:  - 1005-1100  (55 min)    Short Term Goals: Week 1:  OT Short Term Goal 1 (Week 1): Pt. will be supervision with UB bath OT Short Term Goal 2 (Week 1): Pt. will be min assist with LB bathing OT Short Term Goal 3 (Week 1): Pt. will go from sit to stand with min. assist OT Short Term Goal 4 (Week 1): Pt. will transfer to toilet with min assist  Skilled Therapeutic Interventions/Progress Updates:    Performed BADL at sink level.  Pt asked OT to get rid of numerous fingers (she was referring to cups on table).  Addressed standing at sink for pericare.  Pt stated she had gone to bathroom earlier and had bm.  She was very soiled and stated she had cleaned herself.  Pt. Sequenced bathing and dressing.  Had difficulty with balance and flexibility with leaning over to don LB clothes.  Exhibited decreased hip mobility to don pants around waist.  Pt needed rest break after each step of ADL.  Unable to get saturation level with pulse ox.     Therapy Documentation Precautions:  Precautions Precautions: Fall Precaution Comments: Maintain Sp02 >90% on 2L 02 [place]; monitor vitals Restrictions Weight Bearing Restrictions: No    Vital Signs:  Unable to get saturation level   Pain:  none      SEE FIM for current functional status  Therapy/Group: Individual Therapy  Humberto Seals 01/04/2012, 10:53 AM

## 2012-01-05 LAB — GLUCOSE, CAPILLARY
Glucose-Capillary: 68 mg/dL — ABNORMAL LOW (ref 70–99)
Glucose-Capillary: 89 mg/dL (ref 70–99)

## 2012-01-05 LAB — PROTIME-INR: INR: 2.77 — ABNORMAL HIGH (ref 0.00–1.49)

## 2012-01-05 MED ORDER — WARFARIN SODIUM 2.5 MG PO TABS
2.5000 mg | ORAL_TABLET | Freq: Once | ORAL | Status: AC
  Administered 2012-01-05: 2.5 mg via ORAL
  Filled 2012-01-05: qty 1

## 2012-01-05 NOTE — Progress Notes (Signed)
ANTICOAGULATION CONSULT NOTE - Follow Up Consult  Pharmacy Consult:  Coumadin Indication: Atrial fibrillation  No Known Allergies  Patient Measurements: Height: 5\' 3"  (160 cm) Weight: 192 lb 7.4 oz (87.3 kg) IBW/kg (Calculated) : 52.4   Vital Signs: Temp: 98.5 F (36.9 C) (05/12 0520) Temp src: Oral (05/12 0520) BP: 101/67 mmHg (05/12 0520) Pulse Rate: 68  (05/12 0520)  Labs:  Basename 01/05/12 0700 01/04/12 0540 01/03/12 0540  HGB -- -- 8.5*  HCT -- -- 27.4*  PLT -- -- 501*  APTT -- -- --  LABPROT 29.7* 23.6* 21.2*  INR 2.77* 2.06* 1.80*  HEPARINUNFRC -- -- --  CREATININE -- -- 0.73  CKTOTAL -- -- --  CKMB -- -- --  TROPONINI -- -- --    Estimated Creatinine Clearance: 79.4 ml/min (by C-G formula based on Cr of 0.73).     Assessment: 89 YOF with Afib s/p cardiac surgery for atrial myxoma and to continue on Coumadin therapy.  INR therapeutic today and has increased significantly, no bleeding documented.   Goal of Therapy:  INR 2 - 3   Plan:  - Coumadin 2.5mg  PO today - Daily PT / INR - F/U K+     Raenette Sakata D. Laney Potash, PharmD, BCPS Pager:  [phone] 01/05/2012, 10:32 AM

## 2012-01-05 NOTE — Progress Notes (Signed)
Occupational Therapy Session Note  Patient Details  Name: Courtney Faulkner MRN: [id number] Date of Birth: 06-18-1951  Today's Date: 01/05/2012 Time: 1500-1600 Time Calculation (min): 60 min  Short Term Goals: Week 1:  OT Short Term Goal 1 (Week 1): Pt. will be supervision with UB bath OT Short Term Goal 2 (Week 1): Pt. will be min assist with LB bathing OT Short Term Goal 3 (Week 1): Pt. will go from sit to stand with min. assist OT Short Term Goal 4 (Week 1): Pt. will transfer to toilet with min assist  Skilled Therapeutic Interventions: Therapeutic activities with emphasis on stand-pivot transfers, dynamic sitting and standing balance, endurance, re-ed on proper use of incentive spirometer, effective use of DME, w/c use, and monitoring of 02.  Patient completed transfers w/o 02 needed.  Initial 02 sat was 94% after transfer but pulse oximeter ceased working consistently and patient expressed desire to continue training.  No adverse effect noted after 4 transfers, with good recovery within 5 minutes.  Patient able to perform transfers using R-UE, with occassional hand-over-hand assist for proper placement of hand.    Therapy Documentation Precautions:  Precautions Precautions: Fall Precaution Comments: Maintain Sp02 >90% on 2L 02 [place]; monitor vitals Restrictions Weight Bearing Restrictions: No  Vital Signs: Therapy Vitals Temp: 98.7 F (37.1 C) Temp src: Oral Pulse Rate: 67  Resp: 18  BP: 87/64 mmHg Patient Position, if appropriate: Sitting Oxygen Therapy SpO2: 100 % O2 Device: Nasal cannula  See FIM for current functional status  Therapy/Group: Individual Therapy  Georgeanne Nim 01/05/2012, 4:50 PM

## 2012-01-05 NOTE — Progress Notes (Signed)
Patient ID: Courtney Faulkner, female   DOB: April 06, 1951, 61 y.o.   MRN: [id number] Patient ID: Courtney Faulkner, female   DOB: 1951-07-15, 61 y.o.   MRN: [id number] Subjective/Complaints: No specific complaints today. She states that she feels well other than mild fatigue. Review of Systems  Constitutional: Positive for malaise/fatigue.    Objective: Vital Signs: Blood pressure 101/67, pulse 68, temperature 98.5 F (36.9 C), temperature source Oral, resp. rate 20, height 5\' 3"  (1.6 m), weight 192 lb 7.4 oz (87.3 kg), SpO2 100.00%.  Physical exam: No acute distress. Neck is supple. Chest clear to auscultation cardiac exam S1-S2 are regular. Abdominal exam active bowel sounds, soft. Extremities no edema.  Assessment/Plan: 1. Functional deficits secondary to L ICA infarct which require 3+ hours per day of interdisciplinary therapy in a comprehensive inpatient rehab setting. Physiatrist is providing close team supervision and 24 hour management of active medical problems listed below. Physiatrist and rehab team continue to assess barriers to discharge/monitor patient progress toward functional and medical goals. FIM: FIM - Bathing Bathing Steps Patient Completed: Chest;Right Arm;Left Arm;Abdomen;Front perineal area;Right upper leg;Left upper leg Bathing: 3: Mod-Patient completes 5-7 38f 10 parts or 50-74%  FIM - Upper Body Dressing/Undressing Upper body dressing/undressing steps patient completed: Thread/unthread right bra strap;Thread/unthread left bra strap;Thread/unthread right sleeve of pullover shirt/dresss;Thread/unthread left sleeve of pullover shirt/dress;Put head through opening of pull over shirt/dress;Pull shirt over trunk Upper body dressing/undressing: 4: Min-Patient completed 75 plus % of tasks FIM - Lower Body Dressing/Undressing Lower body dressing/undressing steps patient completed: Pull pants up/down Lower body dressing/undressing: 1: Total-Patient completed less than 25% of  tasks  FIM - Toileting Toileting steps completed by patient:  (Poor peri hygiene from previous toileting) Toileting: 0: Activity did not occur (pt reported she got on toilet earlier and had BM.  )  FIM - Toilet Transfers Toilet Transfers: 3-To toilet/BSC: Mod A (lift or lower assist)  FIM - Banker Devices: Arm rests Bed/Chair Transfer: 3: Bed > Chair or W/C: Mod A (lift or lower assist);3: Chair or W/C > Bed: Mod A (lift or lower assist);3: Supine > Sit: Mod A (lifting assist/Pt. 50-74%/lift 2 legs  FIM - Locomotion: Wheelchair Distance: Attempted w/c mobility training with hand over hand cues for UE propulsion sequence but unable to maintain straight path; total A 150 on unit Locomotion: Wheelchair: 1: Total Assistance/staff pushes wheelchair (Pt<25%) FIM - Locomotion: Ambulation Locomotion: Ambulation Assistive Devices: Designer, industrial/product Ambulation/Gait Assistance: 4: Min assist Locomotion: Ambulation: 1: Travels less than 50 ft with minimal assistance (Pt.>75%)  Comprehension Comprehension Mode: Auditory Comprehension: 5-Understands complex 90% of the time/Cues < 10% of the time  Expression Expression Mode: Verbal Expression: 4-Expresses basic 75 - 89% of the time/requires cueing 10 - 24% of the time. Needs helper to occlude trach/needs to repeat words.  Social Interaction Social Interaction Mode: Not assessed Social Interaction: 5-Interacts appropriately 90% of the time - Needs monitoring or encouragement for participation or interaction.  Problem Solving Problem Solving: 4-Solves basic 75 - 89% of the time/requires cueing 10 - 24% of the time  Memory Memory: 4-Recognizes or recalls 75 - 89% of the time/requires cueing 10 - 24% of the time  2. Anticoagulation/DVT prophylaxis with Pharmaceutical: Coumadin 3. Pain Management: acetominophen Medical Problem List and Plan:  1. 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 Myxoma. 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 DVT Prophylaxis/Anticoagulation: Coumadin per pharmacy.  4. Paroxysmal atrial fibrillation. Amiodarone 400 mg twice a day, Lopressor 100 mg twice a day. Monitor with increased mobility  5. CHF. Lasix 40 mg twice a day. Monitor for any signs of fluid overload. Daily weights  6. Constipation will adjust laxative medications   LOS (Days) 3 A FACE TO FACE EVALUATION WAS PERFORMED  Katrisha Segall HENRY 01/05/2012, 9:27 AM

## 2012-01-05 NOTE — Progress Notes (Signed)
O2 at 2 L/M via [place]. Declined scheduled senna s. Requesting bedpan. Bilateral pedal edema. Decreased intiation, flat affect. Courtney Faulkner

## 2012-01-06 LAB — GLUCOSE, CAPILLARY
Glucose-Capillary: 41 mg/dL — CL (ref 70–99)
Glucose-Capillary: 29 mg/dL — CL (ref 70–99)
Glucose-Capillary: 97 mg/dL (ref 70–99)
Glucose-Capillary: 103 mg/dL — ABNORMAL HIGH (ref 70–99)

## 2012-01-06 LAB — PROTIME-INR: INR: 2.83 — ABNORMAL HIGH (ref 0.00–1.49)

## 2012-01-06 MED ORDER — WARFARIN SODIUM 2.5 MG PO TABS
2.5000 mg | ORAL_TABLET | Freq: Once | ORAL | Status: AC
  Administered 2012-01-06: 2.5 mg via ORAL
  Filled 2012-01-06: qty 1

## 2012-01-06 MED ORDER — SENNOSIDES-DOCUSATE SODIUM 8.6-50 MG PO TABS
2.0000 | ORAL_TABLET | Freq: Two times a day (BID) | ORAL | Status: DC
  Administered 2012-01-06 – 2012-01-17 (×21): 2 via ORAL
  Filled 2012-01-06 (×23): qty 2

## 2012-01-06 NOTE — Plan of Care (Signed)
Problem: RH SKIN INTEGRITY Goal: RH STG ABLE TO PERFORM INCISION/WOUND CARE W/ASSISTANCE STG Able To Perform Incision/Wound Care With min Assistance.  Outcome: Progressing Pt assisedt staff with dressing change today with verbal cueing to hold breast up

## 2012-01-06 NOTE — Progress Notes (Signed)
INITIAL ADULT NUTRITION ASSESSMENT Date: 01/06/2012   Time: 3:16 PM  Reason for Assessment: MD Consult  ASSESSMENT: Female 61 y.o.  Dx: Stroke  Hx:  Past Medical History  Diagnosis Date  . Upper respiratory tract infection   . ICAO (internal carotid artery occlusion), left 12/15/2011  . 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 CVA (cerebral infarction) 12/15/2011  . Hypoglycemia 12/14/2011   Past Surgical History  Procedure Date  . Minimally-invasive excision of left atrial myxoma 12/24/2011    Dr Cornelius Moras   Related Meds:     . amiodarone  200 mg Oral BID PC  . aspirin EC  81 mg Oral Daily  . furosemide  40 mg Oral Daily  . metoprolol tartrate  100 mg Oral BID  . pantoprazole  40 mg Oral QAC breakfast  . potassium chloride  40 mEq Oral Daily  . senna-docusate  2 tablet Oral BID  . sodium chloride  10-40 mL Intracatheter Q12H  . warfarin  2.5 mg Oral ONCE-1800  . Warfarin - Pharmacist Dosing Inpatient   Does not apply q1800  . DISCONTD: senna-docusate  2 tablet Oral QHS   Ht: 5\' 3"  (160 cm)  Wt: 195 lb 5.2 oz (88.6 kg)  Ideal Wt: 52.3 kg % Ideal Wt: 169%  Usual Wt: unable to obtain % Usual Wt: n/a  Body mass index is 34.60 kg/(m^2). Pt is obese; class I  Food/Nutrition Related Hx: Regular diet PTA  Labs:  CMP     Component Value Date/Time   NA 140 01/03/2012 0540   K 3.4* 01/03/2012 0540   CL 101 01/03/2012 0540   CO2 27 01/03/2012 0540   GLUCOSE 122* 01/06/2012 1355   BUN 10 01/03/2012 0540   CREATININE 0.73 01/03/2012 0540   CALCIUM 8.8 01/03/2012 0540   PROT 6.0 01/03/2012 0540   ALBUMIN 2.3* 01/03/2012 0540   AST 29 01/03/2012 0540   ALT 44* 01/03/2012 0540   ALKPHOS 93 01/03/2012 0540   BILITOT 0.3 01/03/2012 0540   GFRNONAA >90 01/03/2012 0540   GFRAA >90 01/03/2012 0540   CBG (last 3)   Basename 01/06/12 1325 01/06/12 1252 01/06/12 1229  GLUCAP 97 29* 35*   Lipid Panel     Component Value Date/Time   CHOL 134 12/16/2011 0311   TRIG 90 12/16/2011 0311   HDL 45  12/16/2011 0311   CHOLHDL 3.0 12/16/2011 0311   VLDL 18 12/16/2011 0311   LDLCALC 71 12/16/2011 0311     Intake/Output Summary (Last 24 hours) at 01/06/12 1518 Last data filed at 01/06/12 1400  Gross per 24 hour  Intake   1200 ml  Output      0 ml  Net   1200 ml   Diet Order: Cardiac  Supplements/Tube Feeding: none  IVF:    Estimated Nutritional Needs:   Kcal: 1450 - 1650 kcal Protein:  65 - 75 grams Fluid:  1.8 - 2 L/d  RD consulted for addition of snacks 2/2 low blood sugars between meals. Pt states that this is a new issue for her. Pt with flat affect. Agreeable to snacks. Does not like Ensure/Boost oral nutrition supplements.  NUTRITION DIAGNOSIS: -Altered nutrition-related laboratory values (specify) (NI-2.2).  Status: Ongoing  RELATED TO: unknown etiology  AS EVIDENCE BY: frequent blood sugars < 80  MONITORING/EVALUATION(Goals): Goal: Pt to consume at least 75% of meals and snacks. Maintain adequate blood sugar control. Monitor: PO intake, CBG's, weights, labs, I/O's  EDUCATION NEEDS: -Education not appropriate at this time  INTERVENTION:  1. RD to set up snacks between meals TID. 2. Recommend Regular diet to liberalize PO intake 3. RD to continue to follow  Dietitian #: [phone]  DOCUMENTATION CODES Per approved criteria  -Obesity Unspecified   Adair Laundry 01/06/2012, 3:16 PM

## 2012-01-06 NOTE — Progress Notes (Signed)
Patient with flat affect and decreased intiation. Bilateral pedal edema. Minimal assistance with stand pivot transfer, drags right leg during transfer.O2 at 2l/m via [place]. Courtney Faulkner

## 2012-01-06 NOTE — Progress Notes (Signed)
Physical Therapy Session Note  Patient Details  Name: Courtney Faulkner MRN: [id number] Date of Birth: 1951/05/08  Today's Date: 01/06/2012 Time: 1107-1200 Time Calculation (min): 53 min  Short Term Goals: Week 1:  PT Short Term Goal 1 (Week 1): Patient will perform bed mobility flat bed with supervision to R and L PT Short Term Goal 2 (Week 1): Patient will perform bed <> w/c transfers with min A to R and L PT Short Term Goal 3 (Week 1): Patient will perform w/c mobility on unit with bilat UE propulsion with min A x 150' PT Short Term Goal 4 (Week 1): Patient will perform gait training on unit with LRAD x 50' with min A PT Short Term Goal 5 (Week 1): Patient will perform up and down 3 stairs with one rail and mod A  Therapy Documentation Precautions:  Precautions Precautions: Fall Precaution Comments: Maintain Sp02 >90% on 2L [name]; monitor vitals Restrictions Weight Bearing Restrictions: No Vital Signs: Therapy Vitals Pulse Rate: 62  Oxygen Therapy SpO2:  (unable to get reading with pulse ox) Pain: Pain Assessment Pain Assessment: No/denies pain Mobility:  Sit to stand training from w/c with focus on safe hand placement, foot placement and full anterior lean; when asked patient to verbalize sequence patient had word finding difficulty to explain "arm rest".  Mod A to fully translate COG over BOS. Performed transfer w/c > toilet with RW and mod A for safety and sequence with AD in narrow spaces, verbal cues for full pivot and hand placement prior to sitting and for balance in standing while doffing and donning clothing; patient became slightly SOB during toileting but unable to get formal reading on pulse ox. Locomotion : Ambulation Ambulation/Gait Assistance: 3: Mod assist with RW x 40' with focus on lateral weight shifts, full step and stride length and R foot clearance and activation of RLE extensors in stance phase; stair training x 2 with bilat rails: up and down 4" steps 1st  repetition with mod A and verbal cues for safe sequence patient will use at home ascending with LLE and descending with RLE; 2nd repetition changed to ascending with RLE and descending with LLE for increased concentric and eccentric muscle activation training; recommended to patient that she have her family look into building rails for increased safety with stair negotiation for home entry and exit.  Other Treatments:  Changed w/c cushion to 20 x 16 for support and comfort in new 20 x 16 w/c  See FIM for current functional status  Therapy/Group: Individual Therapy  Edman [facility] 01/06/2012, 12:48 PM

## 2012-01-06 NOTE — Progress Notes (Signed)
STAT CBG results 122. Courtney Ina, Pa made aware of results, no new orders received at this time. Will continue to monitor. [name], Atticus Lemberger Elon Jester

## 2012-01-06 NOTE — Progress Notes (Signed)
ANTICOAGULATION CONSULT NOTE - Follow Up Consult  Pharmacy Consult:  Coumadin Indication: Atrial fibrillation  No Known Allergies  Patient Measurements: Height: 5\' 3"  (160 cm) Weight: 195 lb 5.2 oz (88.6 kg) IBW/kg (Calculated) : 52.4   Vital Signs: Temp: 98.3 F (36.8 C) (05/13 0600) Temp src: Oral (05/13 0600) BP: 98/62 mmHg (05/13 0600) Pulse Rate: 67  (05/13 0600)  Labs:  Basename 01/06/12 0500 01/05/12 0700 01/04/12 0540  HGB -- -- --  HCT -- -- --  PLT -- -- --  APTT -- -- --  LABPROT 30.2* 29.7* 23.6*  INR 2.83* 2.77* 2.06*  HEPARINUNFRC -- -- --  CREATININE -- -- --  CKTOTAL -- -- --  CKMB -- -- --  TROPONINI -- -- --    Estimated Creatinine Clearance: 80 ml/min (by C-G formula based on Cr of 0.73).     Assessment: 89 YOF with Afib s/p cardiac surgery for atrial myxoma and to continue on Coumadin therapy.  INR therapeutic today. No bleeding documented.   Goal of Therapy:  INR 2 - 3   Plan:  - Coumadin 2.5mg  PO today - Daily PT / INR   Christoper Fabian, PharmD, BCPS Clinical pharmacist, pager [phone] 01/06/2012   11:49 AM

## 2012-01-06 NOTE — Evaluation (Signed)
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 Admitted 12/14/2011 with unremarkable past medical history presented with altered mental status and slurred speech. EMS was called by family with noted blood sugar of 34. She was given an amp of D50 with repeat blood sugars of 54. She had initial cranial CT scan that was negative. MRI of the brain showed moderate size acute left hemispheric infarction appeared to be secondary to acute left ICA carotid artery occlusion. CT angiogram head shows acute nonhemorrhagic infarct left basal ganglia and corona radiata. CT angiogram neck with abnormal appearance of the left ICA artery with appearance of dissection with complete occlusion at the C2 level. Carotid Dopplers showed occluded left ICA. Echocardiogram with ejection fraction 65% with no source of embolus. Noted left atrial mass attached to intra-atrial septum highly suggestive of myxoma. TEE identifies 1.7 x 1.7 cm mass left atrial septum again felt to be atrial myxoma. Cardiac catheterization 12/18/2011 with normal coronary arteries. Underwent excision of left atrial myxoma 12/24/2011 per Dr. Barry Dienes. Subcutaneous Lovenox initiated for deep vein thrombosis prophylaxis. Patient developed paroxysmal atrial fibrillation with followup cardiology services and placed on amiodarone therapy as well as Coumadin. She currently remains on Lovenox until INR greater than 2.00. Patient transferred to CIR on 01/02/2012 .   Pt presents with decreased activity tolerance, decreased functional mobility, decreased balance, decreased functional use of RUE, decreased cognition limiting pt's independence with leisure/community pursuits. Leisure  History/Participation Premorbid leisure interest/current participation: Ashby Dawes - Flower gardening;Nature - Vegetable gardening;Sports - Exercise (Comment);Community - Press photographer - Journalist, newspaper - Other (Comment) (walking) Other Leisure Interests: Reading;Computer Leisure Participation Style: Alone;With Family/Friends Awareness of Community Resources: Good-identify 3 post discharge leisure resources Psychosocial / Spiritual Patient agreeable to Pet Therapy: Yes Does patient have pets?: Yes Social interaction - Mood/Behavior: Cooperative Firefighter Appropriate for Education?: Yes Recreational Therapy Orientation Orientation -Reviewed with patient: Available activity resources Strengths/Weaknesses Patient Strengths/Abilities: Willingness to participate Patient weaknesses: Physical limitations;Minimal Premorbid Leisure Activity  Plan Rec Therapy Plan Is patient appropriate for Therapeutic Recreation?: Yes Rehab Potential: Good Treatment times per week: Min 1 time per week > 20 minutes Estimated Length of Stay: 2 weeks TR Treatment/Interventions: Adaptive equipment instruction;Balance/vestibular training;1:1 session;Cognitive remediation/compensation;Community reintegration;Functional mobility training;Patient/family education;Recreation/leisure participation;Therapeutic activities  Recommendations for other services: None  Discharge Criteria: Patient will be discharged from TR if patient refuses treatment 3 consecutive times without medical reason.  If treatment goals not met, if there is a change in medical status, if patient makes no progress towards goals or if patient is discharged from hospital.  The above assessment, treatment plan, treatment alternatives and goals were discussed and mutually agreed upon: by patient  Courtney Faulkner 01/06/2012, 3:39 PM

## 2012-01-06 NOTE — Progress Notes (Signed)
Patient ID: Courtney Faulkner, female   DOB: 07-19-1951, 61 y.o.   MRN: [id number]  Subjective/Complaints: Constipated no other c/os Review of Systems  Gastrointestinal: Positive for constipation.  Genitourinary: Positive for urgency.    Objective: Vital Signs: Blood pressure 98/62, pulse 67, temperature 98.3 F (36.8 C), temperature source Oral, resp. rate 20, height 5\' 3"  (1.6 m), weight 88.6 kg (195 lb 5.2 oz), SpO2 100.00%. No results found. Results for orders placed during the hospital encounter of 01/02/12 (from the past 72 hour(s))  GLUCOSE, CAPILLARY     Status: Normal   Collection Time   01/03/12  8:54 PM      Component Value Range Comment   Glucose-Capillary 85  70 - 99 (mg/dL)    Comment 1 Notify RN     PROTIME-INR     Status: Abnormal   Collection Time   01/04/12  5:40 AM      Component Value Range Comment   Prothrombin Time 23.6 (*) 11.6 - 15.2 (seconds)    INR 2.06 (*) 0.00 - 1.49    GLUCOSE, CAPILLARY     Status: Abnormal   Collection Time   01/04/12  7:55 AM      Component Value Range Comment   Glucose-Capillary 62 (*) 70 - 99 (mg/dL)   GLUCOSE, CAPILLARY     Status: Abnormal   Collection Time   01/04/12  9:23 AM      Component Value Range Comment   Glucose-Capillary 106 (*) 70 - 99 (mg/dL)   GLUCOSE, CAPILLARY     Status: Normal   Collection Time   01/04/12 11:38 AM      Component Value Range Comment   Glucose-Capillary 76  70 - 99 (mg/dL)   GLUCOSE, CAPILLARY     Status: Normal   Collection Time   01/04/12  4:32 PM      Component Value Range Comment   Glucose-Capillary 79  70 - 99 (mg/dL)   GLUCOSE, CAPILLARY     Status: Abnormal   Collection Time   01/04/12  9:00 PM      Component Value Range Comment   Glucose-Capillary 102 (*) 70 - 99 (mg/dL)    Comment 1 Notify RN     PROTIME-INR     Status: Abnormal   Collection Time   01/05/12  7:00 AM      Component Value Range Comment   Prothrombin Time 29.7 (*) 11.6 - 15.2 (seconds)    INR 2.77 (*) 0.00 - 1.49     GLUCOSE, CAPILLARY     Status: Normal   Collection Time   01/05/12  7:12 AM      Component Value Range Comment   Glucose-Capillary 78  70 - 99 (mg/dL)   GLUCOSE, CAPILLARY     Status: Abnormal   Collection Time   01/05/12 11:15 AM      Component Value Range Comment   Glucose-Capillary 68 (*) 70 - 99 (mg/dL)   GLUCOSE, CAPILLARY     Status: Normal   Collection Time   01/05/12 12:36 PM      Component Value Range Comment   Glucose-Capillary 83  70 - 99 (mg/dL)   GLUCOSE, CAPILLARY     Status: Normal   Collection Time   01/05/12  4:03 PM      Component Value Range Comment   Glucose-Capillary 84  70 - 99 (mg/dL)   GLUCOSE, CAPILLARY     Status: Normal   Collection Time   01/05/12  9:10 PM  Component Value Range Comment   Glucose-Capillary 89  70 - 99 (mg/dL)    Comment 1 Notify RN     PROTIME-INR     Status: Abnormal   Collection Time   01/06/12  5:00 AM      Component Value Range Comment   Prothrombin Time 30.2 (*) 11.6 - 15.2 (seconds)    INR 2.83 (*) 0.00 - 1.49    GLUCOSE, CAPILLARY     Status: Normal   Collection Time   01/06/12  7:14 AM      Component Value Range Comment   Glucose-Capillary 87  70 - 99 (mg/dL)    Comment 1 Notify RN        HEENT: normal Cardio: RRR Resp: CTA B/L GI: BS positive Extremity:  No Edema Skin:   Intact Neuro: Flat, Abnormal Motor 2-/5 in R delt,bi,tri,grip,3/5 R HF,KE,Ankle DF, 5/5 on L side and Abnormal FMC Ataxic/ dec FMC Musc/Skel:  Normal   Assessment/Plan: 1. Functional deficits secondary to L ICA infarct which require 3+ hours per day of interdisciplinary therapy in a comprehensive inpatient rehab setting. Physiatrist is providing close team supervision and 24 hour management of active medical problems listed below. Physiatrist and rehab team continue to assess barriers to discharge/monitor patient progress toward functional and medical goals. FIM: FIM - Bathing Bathing Steps Patient Completed: Chest;Right Arm;Left  Arm;Abdomen;Front perineal area;Buttocks Bathing: 3: Mod-Patient completes 5-7 42f 10 parts or 50-74%  FIM - Upper Body Dressing/Undressing Upper body dressing/undressing steps patient completed: Thread/unthread right sleeve of pullover shirt/dresss;Thread/unthread left sleeve of pullover shirt/dress;Put head through opening of pull over shirt/dress;Pull shirt over trunk Upper body dressing/undressing: 4: Min-Patient completed 75 plus % of tasks FIM - Lower Body Dressing/Undressing Lower body dressing/undressing steps patient completed: Pull underwear up/down;Pull pants up/down Lower body dressing/undressing: 2: Max-Patient completed 25-49% of tasks  FIM - Toileting Toileting steps completed by patient: Adjust clothing prior to toileting;Performs perineal hygiene Toileting Assistive Devices: Grab bar or rail for support Toileting: 3: Mod-Patient completed 2 of 3 steps  FIM - Diplomatic Services operational officer Devices: Grab bars Toilet Transfers: 3-From toilet/BSC: Mod A (lift or lower assist);4-To toilet/BSC: Min A (steadying Pt. > 75%)  FIM - Bed/Chair Transfer Bed/Chair Transfer Assistive Devices: Arm rests;Bed rails Bed/Chair Transfer: 4: Supine > Sit: Min A (steadying Pt. > 75%/lift 1 leg);4: Sit > Supine: Min A (steadying pt. > 75%/lift 1 leg)  FIM - Locomotion: Wheelchair Distance: Attempted w/c mobility training with hand over hand cues for UE propulsion sequence but unable to maintain straight path; total A 150 on unit Locomotion: Wheelchair: 1: Total Assistance/staff pushes wheelchair (Pt<25%) FIM - Locomotion: Ambulation Locomotion: Ambulation Assistive Devices: Designer, industrial/product Ambulation/Gait Assistance: 4: Min assist Locomotion: Ambulation: 1: Travels less than 50 ft with minimal assistance (Pt.>75%)  Comprehension Comprehension Mode: Auditory Comprehension: 5-Understands complex 90% of the time/Cues < 10% of the time  Expression Expression Mode:  Verbal Expression: 4-Expresses basic 75 - 89% of the time/requires cueing 10 - 24% of the time. Needs helper to occlude trach/needs to repeat words.  Social Interaction Social Interaction Mode: Not assessed Social Interaction: 5-Interacts appropriately 90% of the time - Needs monitoring or encouragement for participation or interaction.  Problem Solving Problem Solving: 4-Solves basic 75 - 89% of the time/requires cueing 10 - 24% of the time  Memory Memory: 4-Recognizes or recalls 75 - 89% of the time/requires cueing 10 - 24% of the time  2. Anticoagulation/DVT prophylaxis with Pharmaceutical: Coumadin 3. Pain  Management: acetominophen Medical Problem List and Plan:  1. 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 Myxoma. 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 DVT Prophylaxis/Anticoagulation: Coumadin per pharmacy. discontinue Lovenox  INR greater than 2.00. Monitor platelet counts any signs of bleeding  4. Paroxysmal atrial fibrillation. Amiodarone 400 mg twice a day, Lopressor 100 mg twice a day. Monitor with increased mobility  5. CHF. Lasix 40 mg twice a day. Monitor for any signs of fluid overload. Daily weights  6. Constipation will adjust laxative medications   LOS (Days) 4 A FACE TO FACE EVALUATION WAS PERFORMED  Mehar Kirkwood E 01/06/2012, 8:01 AM

## 2012-01-06 NOTE — Progress Notes (Signed)
CBG: 35  Treatment: 15 GM carbohydrate snack  Symptoms: None  Follow-up CBG: Time:1250 CBG Result:29  Possible Reasons for Event: Unknown  Comments/MD notified: Deatra Ina, PA in to see pt at 1310, no apparent symptoms noted, ordered to get STAT Glucose, awaiting results,     [name], Marlos Carmen Elon Jester

## 2012-01-06 NOTE — Progress Notes (Signed)
Speech Language Pathology Daily Session Note  Patient Details  Name: Courtney Faulkner MRN: [id number] Date of Birth: 1951-04-07  Today's Date: 01/06/2012 Time: 1310-1400 Time Calculation (min): 50 min  Short Term Goals: Week 1: SLP Short Term Goal 1 (Week 1): Pt. will use strategies to increase accuracy during description and explanations of complex information with min verbal cueing. SLP Short Term Goal 2 (Week 1): Pt. will increase intellectual awareness by stating details of diagnosis and medical interventions with mod verbal cues. SLP Short Term Goal 3 (Week 1): Pt. will initiate and self correct paraphasias in conversation with min verbal question cues. SLP Short Term Goal 4 (Week 1): Pt. will implement strategies to facilitate working and prospective memory with min verbal cues. SLP Short Term Goal 5 (Week 1): Pt. will demonstrate sustained attention to a familiar task for 5 minutes with minimal assist semantic cues.  Skilled Therapeutic Interventions: Session focused on treatment of language and cognition; SLP facilitated session with moderate assist semantic and visual cues to self monitor verbal expression and max assist to utilize compensatory strategies to repair errors.  Patient demonstrated verbal perseveration of errors today as well.  SLP also questions receptive abilities given complex information such as medical issues with blood pressure and blood sugar.  Patient able to attend to and complete moderately complex problem solving task with minimal assist semantic cues to self monitor due to difference in written work from oral reading with no cues for math or to sustain attention.    Daily Session General  Amount of Missed SLP Time (min): 10 Minutes Missed Time Reason: Patient ill (Comment) (low blood sugars) Pain Pain Assessment Pain Assessment: No/denies pain Pain Score: 0-No pain  Therapy/Group: Individual Therapy  Charlane Ferretti.,  CCC-SLP [phone]  Tilla Wilborn 01/06/2012, 4:46 PM

## 2012-01-06 NOTE — Progress Notes (Signed)
hCBG: 44  Treatment: 15 GM carbohydrate snack  Symptoms: None  Follow-up CBG: Time:1226 CBG Result:35  Possible Reasons for Event: Unknown  Comments/MD notified: Deatra Ina, PA notified no new orders given, will give additional 15 gm snack, pt ate all of lunch, denies any discomfort, N /V, sweating, etc. Will continue to monitor.   [name], Christinamarie Tall Elon Jester

## 2012-01-06 NOTE — Progress Notes (Signed)
Social Work Assessment and Plan Social Work Assessment and Plan  Patient Details  Name: NANDINI BOGDANSKI MRN: [id number] Date of Birth: 1951-01-28  Today's Date: 01/06/2012  Problem List:  Patient Active Problem List  Diagnoses  . Hypoglycemia  . Anemia  . Atrial myxoma  . CVA (cerebral infarction)  . ICAO (internal carotid artery occlusion), left  . Stroke   Past Medical History:  Past Medical History  Diagnosis Date  . Upper respiratory tract infection   . ICAO (internal carotid artery occlusion), left 12/15/2011  . 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 CVA (cerebral infarction) 12/15/2011  . Hypoglycemia 12/14/2011   Past Surgical History:  Past Surgical History  Procedure Date  . Minimally-invasive excision of left atrial myxoma 12/24/2011    Dr Cornelius Moras   Social History:  reports that she quit smoking about 9 years ago. She has never used smokeless tobacco. She reports that she does not drink alcohol or use illicit drugs.  Family / Support Systems Marital Status: Married Patient Roles: Spouse;Parent;Other (Comment) (Employee [employer]) Spouse/Significant Other: MIchael-husband  [phone]  [phone] Children: Daughter-recetnly returned from [place] to assist Other Supports: Mom-local Anticipated Caregiver: Daughter and Mom while husband works, husband in the evenings. Ability/Limitations of Caregiver: Aware pt will require 24 hour care-plan arranged Caregiver Availability: 24/7 Family Dynamics: Close knit family who help one another.  Someone is here daily to be with patient.    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  She is motivated to improve and get well.  Daughter is here and very supportive and involved. Recent Psychosocial Issues: Other medical issues Pyschiatric History: NO history- deferred depression screen due to exhausted and tired.  Will continue to monitor to assess coping throughout stay. Substance Abuse History: No issues  Patient / Family Perceptions, Expectations & Goals Pt/Family understanding of illness & functional limitations: Pt and daughterhave a good understanding of her condition.  Her concern is her unpredictable blood sugars.  RN came in while we were talking to re-check them.  Daughter observing in therapies today. Premorbid pt/family roles/activities: Wife, Mother, Professor, Mining engineer, Chief Financial Officer, etc Anticipated changes in roles/activities/participation: Plans to resume Pt/family expectations/goals: Pt states: " I want to be able to take care of myself again."  Daughter reports: " We hope she does well here, but will assist her to meet her needs."  [employer]: None Premorbid Home Care/DME Agencies: None Transportation available at discharge: [employer] referrals recommended: Support group (specify) ([facility])  Discharge Planning Living Arrangements: Spouse/significant other;Children Support Systems: Spouse/significant other;Children;Parent;Friends/neighbors;Church/faith community Type of Residence: Private residence Community education officer Resources: Media planner (specify) Chief Executive Officer) Financial Resources: Employment;Family Support Financial Screen Referred: No Living Expenses: Lives with family Money Management: Patient;Spouse Do you have any problems obtaining your medications?: No Home  Management: Both Patient/Family Preliminary Plans: Return home with husband, daughter and her Mother to assist with her care.  Aware may need 24 hour care at discharge and have a plan in place. Social Work Anticipated Follow Up Needs: HH/OP;Support Group  Clinical Impression Pleasant female who is sitting up in wheelchair, much more awake than on Friday.  Her daughter is here and supportive.  Concerns over medical issues, ie: blood sugars. Monitor her coping while here.  Lucy Chris 01/06/2012, 1:43 PM

## 2012-01-06 NOTE — Progress Notes (Signed)
Occupational Therapy Session Note  Patient Details  Name: Courtney Faulkner MRN: [id number] Date of Birth: [birth year]  Today's Date: 01/06/2012 Time: 0930-1030 Time Calculation (min): 60 min  Short Term Goals: Week 1:  OT Short Term Goal 1 (Week 1): Pt. will be supervision with UB bath OT Short Term Goal 2 (Week 1): Pt. will be min assist with LB bathing OT Short Term Goal 3 (Week 1): Pt. will go from sit to stand with min. assist OT Short Term Goal 4 (Week 1): Pt. will transfer to toilet with min assist  Skilled Therapeutic Interventions/Progress Updates:    Pt seen for ADL retraining at sink from w/c level.  Focus on sit to stand, standing tolerance with LB dressing, toilet transfer using BSC over toilet to provide rails/grab bars, and sequencing with bathing and dressing.  Pt required frequent rest breaks with bathing and dressing.  Attempted to check O2 sats, but monitor would not read.  Pt reporting not feeling SOB but on occasion would take a deep breath; maintained pt on 2L O2 throughout session.  Pt required assist with LB dressing secondary to decreased ability to reach feet.  Engaged in toilet transfer with use of BSC over toilet for increased lift and bars.  Pt with occasional word finding difficulties during session and slight flat affect.  Therapy Documentation Precautions:  Precautions Precautions: Fall Precaution Comments: Maintain Sp02 >90% on 2L 02 [place]; monitor vitals Restrictions Weight Bearing Restrictions: No Pain:   No c/o pain this session.  See FIM for current functional status  Therapy/Group: Individual Therapy  Leonette Monarch 01/06/2012, 12:30 PM

## 2012-01-06 NOTE — Plan of Care (Signed)
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

## 2012-01-06 NOTE — Plan of Care (Signed)
Problem: RH BOWEL ELIMINATION Goal: RH STG MANAGE BOWEL WITH ASSISTANCE STG Manage Bowel with mod independence.  Outcome: Progressing Pt on Senokot S 2 tablets po BID, increased today

## 2012-01-06 NOTE — Progress Notes (Signed)
CBG:29  Treatment: 15 GM carbohydrate snack  Symptoms: None  Follow-up CBG: Time: 1330 CBG Result:97  Possible Reasons for Event: Unknown  Comments/MD notified: Dan Angiulli,Pa, awaiting STAT blood glucose results from lab.    [name], Ronny Ruddell Elon Jester

## 2012-01-06 NOTE — Plan of Care (Signed)
Problem: RH BLADDER ELIMINATION Goal: RH STG MANAGE BLADDER WITH ASSISTANCE STG Manage Bladder With modified independence  Outcome: Progressing W/Cto BR

## 2012-01-07 ENCOUNTER — Encounter: Payer: Self-pay | Admitting: Thoracic Surgery (Cardiothoracic Vascular Surgery)

## 2012-01-07 DIAGNOSIS — Z5189 Encounter for other specified aftercare: Secondary | ICD-10-CM

## 2012-01-07 DIAGNOSIS — I6330 Cerebral infarction due to thrombosis of unspecified cerebral artery: Secondary | ICD-10-CM

## 2012-01-07 LAB — BASIC METABOLIC PANEL
GFR calc Af Amer: >90 mL/min (ref >90)
GFR calc non Af Amer: >90 mL/min (ref >90)
Glucose, Bld: 116 mg/dL — ABNORMAL HIGH (ref 70–99)
Potassium: 3.7 mEq/L (ref 3.5–5.1)
Sodium: 136 mEq/L (ref 135–145)

## 2012-01-07 LAB — GLUCOSE, CAPILLARY
Glucose-Capillary: 94 mg/dL (ref 70–99)
Glucose-Capillary: 103 mg/dL — ABNORMAL HIGH (ref 70–99)

## 2012-01-07 MED ORDER — WARFARIN SODIUM 5 MG PO TABS
5.0000 mg | ORAL_TABLET | ORAL | Status: DC
  Filled 2012-01-07: qty 1

## 2012-01-07 MED ORDER — WARFARIN SODIUM 2.5 MG PO TABS
2.5000 mg | ORAL_TABLET | ORAL | Status: DC
  Administered 2012-01-07: 2.5 mg via ORAL
  Filled 2012-01-07 (×2): qty 1

## 2012-01-07 NOTE — Progress Notes (Signed)
Patient ID: Courtney Faulkner, female   DOB: 04-Jun-1951, 61 y.o.   MRN: [id number]  Subjective/Complaints: Pt with low CBGs yest am.  Pt denied symptoms.  No SOB but still on O2 Review of Systems  Gastrointestinal: Positive for constipation.  Genitourinary: Positive for urgency.    Objective: Vital Signs: Blood pressure 104/67, pulse 78, temperature 98.5 F (36.9 C), temperature source Oral, resp. rate 18, height 5\' 3"  (1.6 m), weight 87.8 kg (193 lb 9 oz), SpO2 96.00%. No results found. Results for orders placed during the hospital encounter of 01/02/12 (from the past 72 hour(s))  GLUCOSE, CAPILLARY     Status: Abnormal   Collection Time   01/04/12  7:55 AM      Component Value Range Comment   Glucose-Capillary 62 (*) 70 - 99 (mg/dL)   GLUCOSE, CAPILLARY     Status: Abnormal   Collection Time   01/04/12  9:23 AM      Component Value Range Comment   Glucose-Capillary 106 (*) 70 - 99 (mg/dL)   GLUCOSE, CAPILLARY     Status: Normal   Collection Time   01/04/12 11:38 AM      Component Value Range Comment   Glucose-Capillary 76  70 - 99 (mg/dL)   GLUCOSE, CAPILLARY     Status: Normal   Collection Time   01/04/12  4:32 PM      Component Value Range Comment   Glucose-Capillary 79  70 - 99 (mg/dL)   GLUCOSE, CAPILLARY     Status: Abnormal   Collection Time   01/04/12  9:00 PM      Component Value Range Comment   Glucose-Capillary 102 (*) 70 - 99 (mg/dL)    Comment 1 Notify RN     PROTIME-INR     Status: Abnormal   Collection Time   01/05/12  7:00 AM      Component Value Range Comment   Prothrombin Time 29.7 (*) 11.6 - 15.2 (seconds)    INR 2.77 (*) 0.00 - 1.49    GLUCOSE, CAPILLARY     Status: Normal   Collection Time   01/05/12  7:12 AM      Component Value Range Comment   Glucose-Capillary 78  70 - 99 (mg/dL)   GLUCOSE, CAPILLARY     Status: Abnormal   Collection Time   01/05/12 11:15 AM      Component Value Range Comment   Glucose-Capillary 68 (*) 70 - 99 (mg/dL)   GLUCOSE,  CAPILLARY     Status: Normal   Collection Time   01/05/12 12:36 PM      Component Value Range Comment   Glucose-Capillary 83  70 - 99 (mg/dL)   GLUCOSE, CAPILLARY     Status: Normal   Collection Time   01/05/12  4:03 PM      Component Value Range Comment   Glucose-Capillary 84  70 - 99 (mg/dL)   GLUCOSE, CAPILLARY     Status: Normal   Collection Time   01/05/12  9:10 PM      Component Value Range Comment   Glucose-Capillary 89  70 - 99 (mg/dL)    Comment 1 Notify RN     PROTIME-INR     Status: Abnormal   Collection Time   01/06/12  5:00 AM      Component Value Range Comment   Prothrombin Time 30.2 (*) 11.6 - 15.2 (seconds)    INR 2.83 (*) 0.00 - 1.49    GLUCOSE, CAPILLARY  Status: Normal   Collection Time   01/06/12  7:14 AM      Component Value Range Comment   Glucose-Capillary 87  70 - 99 (mg/dL)    Comment 1 Notify RN     GLUCOSE, CAPILLARY     Status: Abnormal   Collection Time   01/06/12 12:02 PM      Component Value Range Comment   Glucose-Capillary 41 (*) 70 - 99 (mg/dL)    Comment 1 Notify RN     GLUCOSE, CAPILLARY     Status: Abnormal   Collection Time   01/06/12 12:26 PM      Component Value Range Comment   Glucose-Capillary 35 (*) 70 - 99 (mg/dL)    Comment 1 Notify RN     GLUCOSE, CAPILLARY     Status: Abnormal   Collection Time   01/06/12 12:29 PM      Component Value Range Comment   Glucose-Capillary 35 (*) 70 - 99 (mg/dL)    Comment 1 Notify RN     GLUCOSE, CAPILLARY     Status: Abnormal   Collection Time   01/06/12 12:52 PM      Component Value Range Comment   Glucose-Capillary 29 (*) 70 - 99 (mg/dL)    Comment 1 Notify RN     GLUCOSE, CAPILLARY     Status: Normal   Collection Time   01/06/12  1:25 PM      Component Value Range Comment   Glucose-Capillary 97  70 - 99 (mg/dL)   GLUCOSE, RANDOM     Status: Abnormal   Collection Time   01/06/12  1:55 PM      Component Value Range Comment   Glucose, Bld 122 (*) 70 - 99 (mg/dL)   GLUCOSE, CAPILLARY      Status: Normal   Collection Time   01/06/12  4:24 PM      Component Value Range Comment   Glucose-Capillary 89  70 - 99 (mg/dL)   GLUCOSE, CAPILLARY     Status: Abnormal   Collection Time   01/06/12  9:13 PM      Component Value Range Comment   Glucose-Capillary 103 (*) 70 - 99 (mg/dL)   PROTIME-INR     Status: Abnormal   Collection Time   01/07/12  5:46 AM      Component Value Range Comment   Prothrombin Time 28.8 (*) 11.6 - 15.2 (seconds)    INR 2.66 (*) 0.00 - 1.49    GLUCOSE, CAPILLARY     Status: Normal   Collection Time   01/07/12  7:05 AM      Component Value Range Comment   Glucose-Capillary 94  70 - 99 (mg/dL)    Comment 1 Notify RN        HEENT: normal Cardio: RRR Resp: CTA B/L GI: BS positive Extremity:  No Edema Skin:   Intact Neuro: Flat, Abnormal Motor 2-/5 in R delt,bi,tri,grip,3/5 R HF,KE,Ankle DF, 5/5 on L side and Abnormal FMC Ataxic/ dec FMC Musc/Skel:  Normal   Assessment/Plan: 1. Functional deficits secondary to L ICA infarct which require 3+ hours per day of interdisciplinary therapy in a comprehensive inpatient rehab setting. Physiatrist is providing close team supervision and 24 hour management of active medical problems listed below. Physiatrist and rehab team continue to assess barriers to discharge/monitor patient progress toward functional and medical goals. FIM: FIM - Bathing Bathing Steps Patient Completed: Chest;Right Arm;Left Arm;Abdomen;Front perineal area;Right upper leg;Left upper leg Bathing: 3: Mod-Patient completes 5-7  33f 10 parts or 50-74%  FIM - Upper Body Dressing/Undressing Upper body dressing/undressing steps patient completed: Thread/unthread right bra strap;Thread/unthread left bra strap;Thread/unthread right sleeve of pullover shirt/dresss;Thread/unthread left sleeve of pullover shirt/dress;Put head through opening of pull over shirt/dress;Pull shirt over trunk Upper body dressing/undressing: 4: Min-Patient completed 75 plus % of  tasks FIM - Lower Body Dressing/Undressing Lower body dressing/undressing steps patient completed: Pull underwear up/down;Pull pants up/down Lower body dressing/undressing: 2: Max-Patient completed 25-49% of tasks  FIM - Toileting Toileting steps completed by patient: Performs perineal hygiene Toileting Assistive Devices: Grab bar or rail for support Toileting: 2: Max-Patient completed 1 of 3 steps  FIM - Diplomatic Services operational officer Devices: Art gallery manager Transfers: 3-To toilet/BSC: Mod A (lift or lower assist);3-From toilet/BSC: Mod A (lift or lower assist)  FIM - Bed/Chair Transfer Bed/Chair Transfer Assistive Devices: Therapist, occupational: 3: Bed > Chair or W/C: Mod A (lift or lower assist);3: Chair or W/C > Bed: Mod A (lift or lower assist)  FIM - Locomotion: Wheelchair Distance: Attempted w/c mobility training with hand over hand cues for UE propulsion sequence but unable to maintain straight path; total A 150 on unit Locomotion: Wheelchair: 1: Total Assistance/staff pushes wheelchair (Pt<25%) FIM - Locomotion: Ambulation Locomotion: Ambulation Assistive Devices: Designer, industrial/product Ambulation/Gait Assistance: 3: Mod assist Locomotion: Ambulation: 1: Travels less than 50 ft with moderate assistance (Pt: 50 - 74%)  Comprehension Comprehension Mode: Auditory Comprehension: 5-Understands complex 90% of the time/Cues < 10% of the time  Expression Expression Mode: Verbal Expression: 4-Expresses basic 75 - 89% of the time/requires cueing 10 - 24% of the time. Needs helper to occlude trach/needs to repeat words.  Social Interaction Social Interaction Mode: Not assessed Social Interaction: 5-Interacts appropriately 90% of the time - Needs monitoring or encouragement for participation or interaction.  Problem Solving Problem Solving: 4-Solves basic 75 - 89% of the time/requires cueing 10 - 24% of the time  Memory Memory: 4-Recognizes or recalls 75 - 89% of the  time/requires cueing 10 - 24% of the time  2. Anticoagulation/DVT prophylaxis with Pharmaceutical: Coumadin 3. Pain Management: acetominophen Medical Problem List and Plan:  1. 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 Myxoma. 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 DVT Prophylaxis/Anticoagulation: Coumadin per pharmacy. 4. Paroxysmal atrial fibrillation. Amiodarone 400 mg twice a day, Lopressor 100 mg twice a day. Monitor with increased mobility  5. CHF. Lasix 40 mg twice a day. Monitor for any signs of fluid overload. Daily weights , wean O2 6. Constipation will adjust laxative medications 7.  Hypoglycemia asymptomatic yesterday- check serum glucose at 1100 today.  If hypoglycemia, will need endocrine consult  LOS (Days) 5 A FACE TO FACE EVALUATION WAS PERFORMED  Shaguana Love E 01/07/2012, 7:42 AM

## 2012-01-07 NOTE — Progress Notes (Signed)
Speech Language Pathology Daily Session Note  Patient Details  Name: Courtney Faulkner MRN: [id number] Date of Birth: 10-07-1950  Today's Date: 01/07/2012 Time: 1345-1430 Time Calculation (min): 45 min  Short Term Goals: Week 1: SLP Short Term Goal 1 (Week 1): Pt. will use strategies to increase accuracy during description and explanations of complex information with min verbal cueing. SLP Short Term Goal 1 - Progress (Week 1): Progressing toward goal SLP Short Term Goal 2 (Week 1): Pt. will increase intellectual awareness by stating details of diagnosis and medical interventions with mod verbal cues. SLP Short Term Goal 2 - Progress (Week 1): Progressing toward goal SLP Short Term Goal 3 (Week 1): Pt. will initiate and self correct paraphasias in conversation with min verbal question cues. SLP Short Term Goal 3 - Progress (Week 1): Progressing toward goal SLP Short Term Goal 4 (Week 1): Pt. will implement strategies to facilitate working and prospective memory with min verbal cues. SLP Short Term Goal 5 (Week 1): Pt. will demonstrate sustained attention to a familiar task for 5 minutes with minimal assist semantic cues.  Skilled Therapeutic Interventions:  Session focused on expressive language and cognition.  SLP provided max reminders initially to use description strategy during conversation and confrontational describing activity ("describe an apple") and  mod-max assist for accuracy.  Emergent awareness of paraphasias has significantly improved with continued difficulty for self correction.  SLP provided min verbal prompts for pt. to state reason for CVA and intervention (improved from initial assessment).  Pt. sustained attention for 5-6 minutes while reading half of a newspaper article with 90% accuracy (accurate word, visual organization) and answered questions about paragraph with min verbal cues.   Daily Session Precautions/Restrictions    FIM:  Comprehension Comprehension Mode:  Auditory Comprehension: 5-Understands complex 90% of the time/Cues < 10% of the time Expression Expression Mode: Verbal Expression: 4-Expresses basic 75 - 89% of the time/requires cueing 10 - 24% of the time. Needs helper to occlude trach/needs to repeat words. Social Interaction Social Interaction Mode: Not assessed Social Interaction: 5-Interacts appropriately 90% of the time - Needs monitoring or encouragement for participation or interaction. Problem Solving Problem Solving: 4-Solves basic 75 - 89% of the time/requires cueing 10 - 24% of the time Memory Memory: 4-Recognizes or recalls 75 - 89% of the time/requires cueing 10 - 24% of the time FIM - Eating Eating Activity: 5: Set-up assist for open containers General    Pain Pain Assessment Pain Assessment: No/denies pain  Therapy/Group: Individual Therapy  Royce Macadamia 01/07/2012, 2:59 PM

## 2012-01-07 NOTE — Progress Notes (Signed)
Pt ate 100% of breakfast, snack of peanut butter and crackers and sprite given at 10:30, Pt's blood sugar at 11:30 was 75. Dan Finland PA made aware.

## 2012-01-07 NOTE — Progress Notes (Signed)
Physical Therapy Session Note  Patient Details  Name: Courtney Faulkner MRN: [id number] Date of Birth: [birth year]  Today's Date: 01/07/2012 Time: 1101-1200 Time Calculation (min): 59 min  Short Term Goals: Week 1:  PT Short Term Goal 1 (Week 1): Patient will perform bed mobility flat bed with supervision to R and L PT Short Term Goal 2 (Week 1): Patient will perform bed <> w/c transfers with min A to R and L PT Short Term Goal 3 (Week 1): Patient will perform w/c mobility on unit with bilat UE propulsion with min A x 150' PT Short Term Goal 4 (Week 1): Patient will perform gait training on unit with LRAD x 50' with min A PT Short Term Goal 5 (Week 1): Patient will perform up and down 3 stairs with one rail and mod A  Skilled Therapeutic Interventions/Progress Updates:   Patient has been having episodes of low CBG; during therapy session patient's CBG read 75  Therapy Documentation Precautions:  Precautions Precautions: Fall Precaution Comments: Maintain Sp02 >90% on 2L 02 [place]; monitor vitals, expressive aphasia Restrictions Weight Bearing Restrictions: No Pain:  No c/o pain at rest; did c/o discomfort at incisions during bed mobility but subsided with rest Mobility: Patient demonstrated sup > sit flat mat through throwing her trunk backwards and bringing LLE up onto the mat but leaving RLE and RUE off the edge and not bringing them up onto the mat until verbally cued.  Bed mobility training on flat bed with focus on attention to RUE and RLE during rolling L and R and use of trunk and LE flexion to prepare and complete rolling and use of trunk shortening <> elongation with pelvic depression and elevation for side <> sit EOB; patient c/o pain in R trunk incisions with side <> sit, changed to performing sit > supine through long sitting bringing each LE up onto mat prior to reclining to flat. Patient stating she doesn't feel comfortably lying flat but had increased difficulty finding the  correct words to describe her discomfort; stated she "hates this exercise."    Sit <> stand training with focus on upright trunk, full anterior lean over R knee with UE supported on R knee for increased R lateral weight shift and activation of RLE extensors during sit <> stand x 3 reps and stand pivot training and transfer w/c > toilet with RW with focus on attention to RUE and RLE placement, safe RW management when pivoting and full pivot with RW and feet prior to sitting mod A for lifting and verbal and visual cues needed for sequence, safety and to advance R foot forwards, laterally or retro  See FIM for current functional status  Therapy/Group: Individual Therapy  Edman Circle [facility] 01/07/2012, 12:39 PM

## 2012-01-07 NOTE — Progress Notes (Signed)
ANTICOAGULATION CONSULT NOTE - Follow Up Consult  Pharmacy Consult:  Coumadin Indication: Atrial fibrillation  No Known Allergies  Patient Measurements: Height: 5\' 3"  (160 cm) Weight: 193 lb 9 oz (87.8 kg) IBW/kg (Calculated) : 52.4   Vital Signs: Temp: 98.5 F (36.9 C) (05/14 0536) Temp src: Oral (05/14 0536) BP: 128/80 mmHg (05/14 0803) Pulse Rate: 76  (05/14 0803)  Labs:  Basename 01/07/12 0546 01/06/12 0500 01/05/12 0700  HGB -- -- --  HCT -- -- --  PLT -- -- --  APTT -- -- --  LABPROT 28.8* 30.2* 29.7*  INR 2.66* 2.83* 2.77*  HEPARINUNFRC -- -- --  CREATININE -- -- --  CKTOTAL -- -- --  CKMB -- -- --  TROPONINI -- -- --    Estimated Creatinine Clearance: 79.6 ml/min (by C-G formula based on Cr of 0.73).     Assessment: 52 YOF with Afib s/p cardiac surgery for atrial myxoma and to continue on Coumadin therapy.  INR therapeutic today. No bleeding documented.  Goal of Therapy:  INR 2 - 3  Plan:  - Coumadin 2.5mg  po Tues/Thur/Sat/Sun and 5mg  po Mon/ Wed/Fri - Daily PT / INR; will consider change to M/W/F tomorrow if remains stable   Christoper Fabian, PharmD, BCPS Clinical pharmacist, pager [phone] 01/07/2012   9:56 AM

## 2012-01-07 NOTE — Progress Notes (Signed)
Occupational Therapy Session Note  Patient Details  Name: Courtney Faulkner MRN: [id number] Date of Birth: Aug 15, 1951  Today's Date: 01/07/2012 Time: 7829-5621 Time Calculation (min): 42 min  Short Term Goals: Week 1:  OT Short Term Goal 1 (Week 1): Pt. will be supervision with UB bath OT Short Term Goal 2 (Week 1): Pt. will be min assist with LB bathing OT Short Term Goal 3 (Week 1): Pt. will go from sit to stand with min. assist OT Short Term Goal 4 (Week 1): Pt. will transfer to toilet with min assist  Skilled Therapeutic Interventions/Progress Updates:    Pt seen for ADL retraining at sink from w/c level.  Focus on sit to stand, standing tolerance, toilet transfers, and LB bathing and dressing techniques.  Pt required min assist stand pivot to [facility] from w/c, mod assist return to w/c.  Pt supervision sit to stand, progressing to min assist as she fatigued.  O2 remained >90% with intermittent monitoring.  Therapy Documentation Precautions:  Precautions Precautions: Fall Precaution Comments: Maintain Sp02 >90% on 2L 02 [place]; monitor vitals Restrictions Weight Bearing Restrictions: No Pain:   No c/o pain this session.  See FIM for current functional status  Therapy/Group: Individual Therapy  Leonette Monarch 01/07/2012, 12:24 PM

## 2012-01-08 LAB — GLUCOSE, CAPILLARY
Glucose-Capillary: 59 mg/dL — ABNORMAL LOW (ref 70–99)
Glucose-Capillary: 79 mg/dL (ref 70–99)
Glucose-Capillary: 75 mg/dL (ref 70–99)
Glucose-Capillary: 88 mg/dL (ref 70–99)
Glucose-Capillary: 83 mg/dL (ref 70–99)

## 2012-01-08 LAB — PROTIME-INR: Prothrombin Time: 29.3 seconds — ABNORMAL HIGH (ref 11.6–15.2)

## 2012-01-08 MED ORDER — METHYLPHENIDATE HCL 5 MG PO TABS
5.0000 mg | ORAL_TABLET | Freq: Two times a day (BID) | ORAL | Status: DC
  Administered 2012-01-08 – 2012-01-17 (×18): 5 mg via ORAL
  Filled 2012-01-08 (×18): qty 1

## 2012-01-08 MED ORDER — WARFARIN SODIUM 2.5 MG PO TABS
2.5000 mg | ORAL_TABLET | Freq: Every day | ORAL | Status: DC
  Administered 2012-01-08 – 2012-01-09 (×2): 2.5 mg via ORAL
  Filled 2012-01-08 (×3): qty 1

## 2012-01-08 MED ORDER — METOPROLOL TARTRATE 50 MG PO TABS
50.0000 mg | ORAL_TABLET | Freq: Two times a day (BID) | ORAL | Status: DC
  Administered 2012-01-08 – 2012-01-09 (×2): 50 mg via ORAL
  Filled 2012-01-08 (×4): qty 1

## 2012-01-08 NOTE — Patient Care Conference (Signed)
Inpatient RehabilitationTeam Conference Note Date: 01/08/2012   Time: 11:25 AM   Patient Name: Courtney Faulkner      Medical Record Number: [id number]  Date of Birth: Feb 06, 1951 Sex: Female         Room/Bed: 4144/4144-01 Payor Info: Payor: BLUE [employer]OSS BLUE [employer]IELD  Plan: [facility] HEALTH PPO  Product Type: *No Product type*     Admitting Diagnosis: L CVA  Admit Date/Time:  01/02/2012  3:17 PM Admission Comments: No comment available   Primary Diagnosis:  Stroke Principal Problem: Stroke  Patient Active Problem List  Diagnoses Date Noted  . Stroke 01/02/2012  . 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 CVA (cerebral infarction) 12/15/2011  . ICAO (internal carotid artery occlusion), left 12/15/2011  . Hypoglycemia 12/14/2011  . Anemia 12/14/2011    Expected Discharge Date: Expected Discharge Date: 01/17/12  Team Members Present: Physician: Dr. Claudette Laws Case Manager Present: Lutricia Horsfall, RN Social Worker Present: Dossie Der, LCSW Nurse Present: Rosalio Macadamia, RN PT Present: Edman Circle, PT;Caroline Adriana Simas, PT OT Present: Bretta Bang, Verlene Mayer, OT SLP Present: Fae Pippin, SLP     Current Status/Progress Goal Weekly Team Focus  Medical   poor awareness of deficits, language deficits, attention concentration problems,  improve attention and concentration, improve aphasia  medication trial   Bowel/Bladder   Incontinent of bowel and bladder at times-greater at night than during the day.  Up to bathroom or BSC.  Briefs in use.  Min assist to manage bowel and bladder.      Swallow/Nutrition/ Hydration             ADL's   min assist UB dsg, mod assist bathing and LB dsg, max assist toileting, mod assist toilet transfer  Mod I overall  activity tolerance, endurance, transfers   Mobility   mod A overall  supervision overall except min A stairs  activity tolerance, balance, transfers, gait   Communication   min-mod assist  minimal assist  carryover, self monitoring  and correcting   Safety/Cognition/ Behavioral Observations  mod-max assit  minimal assist  increase awareness and self monitoring   Pain   Denies pain.  </=2      Skin   Incision to right chest with dermabond; 2 sutures under right breast.  No new skin breakdown; incisions will remain free of infection.         *See Interdisciplinary Assessment and Plan and progress notes for long and short-term goals  Barriers to Discharge: patient anxious to go home but does not relies the amount of care she will require    Possible Resolutions to Barriers:  patient and family education    Discharge Planning/Teaching Needs:  Home with family-daughter during the day and husband in the evenings.      Team Discussion:  Discussion of dx, goals, d/c plan. Pt with poor attention and concentration. Started on ritalin trial. Pt c/o dizziness. BPs low -- to adjust meds. Pt very frustrated with language deficits. Goals downgraded to supervision - min A.    O2 sats decrease with activity.  Revisions to Treatment Plan:   Goals downgraded to supervision - min A.   Continued Need for Acute Rehabilitation Level of Care: The patient requires daily medical management by a physician with specialized training in physical medicine and rehabilitation for the following conditions: Daily direction of a multidisciplinary physical rehabilitation program to ensure safe treatment while eliciting the highest outcome that is of practical value to the patient.: Yes Daily medical management of patient  stability for increased activity during participation in an intensive rehabilitation regime.: Yes Daily analysis of laboratory values and/or radiology reports with any subsequent need for medication adjustment of medical intervention for : Neurological problems  Meryl Dare 01/08/2012, 3:16 PM

## 2012-01-08 NOTE — Progress Notes (Signed)
Nutrition Follow-up  Per chart, on 5/14 pt ate 100% of breakfast, snack of peanut butter and crackers and sprite given at 10:30, pt's blood sugar at 11:30 was 75. Discussed scheduled snacks in fridge with current RN.  Eating 85 - 100% of meals.  Diet Order:  Heart Healthy diet with Snacks TID  Meds: Scheduled Meds:   . amiodarone  200 mg Oral BID PC  . aspirin EC  81 mg Oral Daily  . furosemide  40 mg Oral Daily  . metoprolol tartrate  50 mg Oral BID  . pantoprazole  40 mg Oral QAC breakfast  . potassium chloride  40 mEq Oral Daily  . senna-docusate  2 tablet Oral BID  . sodium chloride  10-40 mL Intracatheter Q12H  . warfarin  2.5 mg Oral q1800  . Warfarin - Pharmacist Dosing Inpatient   Does not apply q1800  . DISCONTD: metoprolol tartrate  100 mg Oral BID  . DISCONTD: warfarin  2.5 mg Oral Q T,Th,S,Su-1800  . DISCONTD: warfarin  5 mg Oral Q M,W,F-1800   Continuous Infusions:  PRN Meds:.acetaminophen, albuterol, ondansetron (ZOFRAN) IV, ondansetron, polyethylene glycol, sodium chloride, sorbitol, traMADol  Labs:  CMP     Component Value Date/Time   NA 136 01/07/2012 1310   K 3.7 01/07/2012 1310   CL 100 01/07/2012 1310   CO2 27 01/07/2012 1310   GLUCOSE 116* 01/07/2012 1310   BUN 10 01/07/2012 1310   CREATININE 0.65 01/07/2012 1310   CALCIUM 9.2 01/07/2012 1310   PROT 6.0 01/03/2012 0540   ALBUMIN 2.3* 01/03/2012 0540   AST 29 01/03/2012 0540   ALT 44* 01/03/2012 0540   ALKPHOS 93 01/03/2012 0540   BILITOT 0.3 01/03/2012 0540   GFRNONAA >90 01/07/2012 1310   GFRAA >90 01/07/2012 1310   CBG (last 3)   Basename 01/08/12 0731 01/07/12 2139 01/07/12 1546  GLUCAP 85 97 103*     Intake/Output Summary (Last 24 hours) at 01/08/12 1103 Last data filed at 01/08/12 0827  Gross per 24 hour  Intake    960 ml  Output      4 ml  Net    956 ml   Weight Status:  88 kg - wt stable  Estimated needs:  1450 - 1650 kcal, 65 - 75 grams protein  Nutrition Dx:  Altered nutrition-related lab  values - ongoing.  Goal:  Pt to consume at least 75% of meals and snacks. Maintain adequate blood sugar control.  Intervention:  Continue current interventions.  Monitor:  PO intake, CBG's, weights, labs, I/O's  Adair Laundry Pager #:  [phone]

## 2012-01-08 NOTE — Care Management Note (Signed)
Met with pt and her daughter and spoke with husband by phone to report on team conference. All in agreement with goals and d/c date of 01/17/12.

## 2012-01-08 NOTE — Progress Notes (Signed)
ANTICOAGULATION CONSULT NOTE - Follow Up Consult  Pharmacy Consult:  Coumadin Indication: Atrial fibrillation  No Known Allergies  Patient Measurements: Height: 5\' 3"  (160 cm) Weight: 194 lb 0.1 oz (88 kg) IBW/kg (Calculated) : 52.4   Vital Signs: Temp: 98.4 F (36.9 C) (05/15 0606) Temp src: Oral (05/15 0606) BP: 98/62 mmHg (05/15 0840) Pulse Rate: 80  (05/15 0840)  Labs:  Basename 01/08/12 0450 01/07/12 1310 01/07/12 0546 01/06/12 0500  HGB -- -- -- --  HCT -- -- -- --  PLT -- -- -- --  APTT -- -- -- --  LABPROT 29.3* -- 28.8* 30.2*  INR 2.72* -- 2.66* 2.83*  HEPARINUNFRC -- -- -- --  CREATININE -- 0.65 -- --  CKTOTAL -- -- -- --  CKMB -- -- -- --  TROPONINI -- -- -- --    Estimated Creatinine Clearance: 79.6 ml/min (by C-G formula based on Cr of 0.65).  Assessment: 63 YOF with Afib s/p cardiac surgery for atrial myxoma and to continue on Coumadin therapy.  INR therapeutic today - appears stable on 2.5mg  daily. No bleeding documented.  Goal of Therapy:  INR 2 - 3  Plan:  - Change coumadin to 2.5mg  po daily - Change PT/INR to M/W/F    Christoper Fabian, PharmD, BCPS Clinical pharmacist, pager [phone] 01/08/2012   10:26 AM

## 2012-01-08 NOTE — Progress Notes (Signed)
Speech Language Pathology Daily Session Note  Patient Details  Name: Courtney Faulkner MRN: [id number] Date of Birth: [birth year]  Today's Date: 01/08/2012 Time: 4098-1191 Time Calculation (min): 30 min  Short Term Goals: Week 1: SLP Short Term Goal 1 (Week 1): Pt. will use strategies to increase accuracy during description and explanations of complex information with min verbal cueing. SLP Short Term Goal 1 - Progress (Week 1): Progressing toward goal SLP Short Term Goal 2 (Week 1): Pt. will increase intellectual awareness by stating details of diagnosis and medical interventions with mod verbal cues. SLP Short Term Goal 2 - Progress (Week 1): Progressing toward goal SLP Short Term Goal 3 (Week 1): Pt. will initiate and self correct paraphasias in conversation with min verbal question cues. SLP Short Term Goal 3 - Progress (Week 1): Progressing toward goal SLP Short Term Goal 4 (Week 1): Pt. will implement strategies to facilitate working and prospective memory with min verbal cues. SLP Short Term Goal 5 (Week 1): Pt. will demonstrate sustained attention to a familiar task for 5 minutes with minimal assist semantic cues.  Skilled Therapeutic Interventions: Session focused on language treatment; SLP facilitated session with minimal assist semantic cues to self monitor verbal expression and moderate assist semantic to correct errors with use of description to facilitate self corrections with spontaneous self correction x1.  Patient on room air for 30 minutes while seated and participating in conversation with intermittent SpO2 readings between 95-99%.  Patient with eyes closed during most of session but denied SOB or dizziness; however stated it was difficult for her to focus on things up close and as a result she requested to end session early.   Daily Session Precautions/Restrictions  Restrictions Weight Bearing Restrictions: No FIM:  Comprehension Comprehension Mode:  Auditory Comprehension: 5-Understands complex 90% of the time/Cues < 10% of the time Expression Expression Mode: Verbal Expression: 4-Expresses basic 75 - 89% of the time/requires cueing 10 - 24% of the time. Needs helper to occlude trach/needs to repeat words. Social Interaction Social Interaction: 5-Interacts appropriately 90% of the time - Needs monitoring or encouragement for participation or interaction. Problem Solving Problem Solving: 4-Solves basic 75 - 89% of the time/requires cueing 10 - 24% of the time Memory Memory: 4-Recognizes or recalls 75 - 89% of the time/requires cueing 10 - 24% of the time General  Amount of Missed SLP Time (min): 15 Minutes Missed Time Reason: Patient unwilling to participate Pain Pain Assessment Pain Assessment: No/denies pain Pain Score: 0-No pain  Therapy/Group: Individual Therapy  Charlane Ferretti., CCC-SLP [phone]  Alyra Patty 01/08/2012, 11:17 AM

## 2012-01-08 NOTE — Progress Notes (Signed)
Patient ID: Courtney Faulkner, female   DOB: 1951-01-31, 61 y.o.   MRN: [id number]  Subjective/Complaints:  Review of Systems  Gastrointestinal: Positive for constipation.  Genitourinary: Positive for urgency.    Objective: Vital Signs: Blood pressure 106/62, pulse 70, temperature 98.4 F (36.9 C), temperature source Oral, resp. rate 18, height 5\' 3"  (1.6 m), weight 88 kg (194 lb 0.1 oz), SpO2 92.00%. No results found. Results for orders placed during the hospital encounter of 01/02/12 (from the past 72 hour(s))  GLUCOSE, CAPILLARY     Status: Abnormal   Collection Time   01/05/12 11:15 AM      Component Value Range Comment   Glucose-Capillary 68 (*) 70 - 99 (mg/dL)   GLUCOSE, CAPILLARY     Status: Normal   Collection Time   01/05/12 12:36 PM      Component Value Range Comment   Glucose-Capillary 83  70 - 99 (mg/dL)   GLUCOSE, CAPILLARY     Status: Normal   Collection Time   01/05/12  4:03 PM      Component Value Range Comment   Glucose-Capillary 84  70 - 99 (mg/dL)   GLUCOSE, CAPILLARY     Status: Normal   Collection Time   01/05/12  9:10 PM      Component Value Range Comment   Glucose-Capillary 89  70 - 99 (mg/dL)    Comment 1 Notify RN     PROTIME-INR     Status: Abnormal   Collection Time   01/06/12  5:00 AM      Component Value Range Comment   Prothrombin Time 30.2 (*) 11.6 - 15.2 (seconds)    INR 2.83 (*) 0.00 - 1.49    GLUCOSE, CAPILLARY     Status: Normal   Collection Time   01/06/12  7:14 AM      Component Value Range Comment   Glucose-Capillary 87  70 - 99 (mg/dL)    Comment 1 Notify RN     GLUCOSE, CAPILLARY     Status: Abnormal   Collection Time   01/06/12 12:02 PM      Component Value Range Comment   Glucose-Capillary 41 (*) 70 - 99 (mg/dL)    Comment 1 Notify RN     GLUCOSE, CAPILLARY     Status: Abnormal   Collection Time   01/06/12 12:29 PM      Component Value Range Comment   Glucose-Capillary 35 (*) 70 - 99 (mg/dL)    Comment 1 Notify RN     GLUCOSE,  CAPILLARY     Status: Abnormal   Collection Time   01/06/12 12:52 PM      Component Value Range Comment   Glucose-Capillary 29 (*) 70 - 99 (mg/dL)    Comment 1 Notify RN     GLUCOSE, CAPILLARY     Status: Normal   Collection Time   01/06/12  1:25 PM      Component Value Range Comment   Glucose-Capillary 97  70 - 99 (mg/dL)   GLUCOSE, RANDOM     Status: Abnormal   Collection Time   01/06/12  1:55 PM      Component Value Range Comment   Glucose, Bld 122 (*) 70 - 99 (mg/dL)   GLUCOSE, CAPILLARY     Status: Normal   Collection Time   01/06/12  4:24 PM      Component Value Range Comment   Glucose-Capillary 89  70 - 99 (mg/dL)   GLUCOSE, CAPILLARY  Status: Abnormal   Collection Time   01/06/12  9:13 PM      Component Value Range Comment   Glucose-Capillary 103 (*) 70 - 99 (mg/dL)   PROTIME-INR     Status: Abnormal   Collection Time   01/07/12  5:46 AM      Component Value Range Comment   Prothrombin Time 28.8 (*) 11.6 - 15.2 (seconds)    INR 2.66 (*) 0.00 - 1.49    GLUCOSE, CAPILLARY     Status: Normal   Collection Time   01/07/12  7:05 AM      Component Value Range Comment   Glucose-Capillary 94  70 - 99 (mg/dL)    Comment 1 Notify RN     GLUCOSE, CAPILLARY     Status: Normal   Collection Time   01/07/12 11:47 AM      Component Value Range Comment   Glucose-Capillary 75  70 - 99 (mg/dL)    Comment 1 Notify RN      Comment 2 Orig Pt Id entered as [id number]     BASIC METABOLIC PANEL     Status: Abnormal   Collection Time   01/07/12  1:10 PM      Component Value Range Comment   Sodium 136  135 - 145 (mEq/L)    Potassium 3.7  3.5 - 5.1 (mEq/L)    Chloride 100  96 - 112 (mEq/L)    CO2 27  19 - 32 (mEq/L)    Glucose, Bld 116 (*) 70 - 99 (mg/dL)    BUN 10  6 - 23 (mg/dL)    Creatinine, Ser 4.09  0.50 - 1.10 (mg/dL)    Calcium 9.2  8.4 - 10.5 (mg/dL)    GFR calc non Af Amer >90  >90 (mL/min)    GFR calc Af Amer >90  >90 (mL/min)   GLUCOSE, CAPILLARY     Status: Abnormal    Collection Time   01/07/12  3:46 PM      Component Value Range Comment   Glucose-Capillary 103 (*) 70 - 99 (mg/dL)   GLUCOSE, CAPILLARY     Status: Normal   Collection Time   01/07/12  9:39 PM      Component Value Range Comment   Glucose-Capillary 97  70 - 99 (mg/dL)    Comment 1 Notify RN     PROTIME-INR     Status: Abnormal   Collection Time   01/08/12  4:50 AM      Component Value Range Comment   Prothrombin Time 29.3 (*) 11.6 - 15.2 (seconds)    INR 2.72 (*) 0.00 - 1.49    GLUCOSE, CAPILLARY     Status: Normal   Collection Time   01/08/12  7:31 AM      Component Value Range Comment   Glucose-Capillary 85  70 - 99 (mg/dL)      HEENT: normal Cardio: RRR Resp: CTA B/L GI: BS positive Extremity:  No Edema Skin:   Intact Neuro: Flat, Abnormal Motor 2-/5 in R delt,bi,tri,grip,3/5 R HF,KE,Ankle DF, 5/5 on L side and Abnormal FMC Ataxic/ dec FMC Musc/Skel:  Normal   Assessment/Plan: 1. Functional deficits secondary to L ICA infarct which require 3+ hours per day of interdisciplinary therapy in a comprehensive inpatient rehab setting. Physiatrist is providing close team supervision and 24 hour management of active medical problems listed below. Physiatrist and rehab team continue to assess barriers to discharge/monitor patient progress toward functional and medical goals. FIM: FIM - Bathing  Bathing Steps Patient Completed: Chest;Right Arm;Left Arm;Abdomen;Front perineal area;Right upper leg;Left upper leg Bathing: 3: Mod-Patient completes 5-7 75f 10 parts or 50-74%  FIM - Upper Body Dressing/Undressing Upper body dressing/undressing steps patient completed: Thread/unthread right bra strap;Thread/unthread left bra strap;Thread/unthread right sleeve of pullover shirt/dresss;Thread/unthread left sleeve of pullover shirt/dress;Put head through opening of pull over shirt/dress;Pull shirt over trunk Upper body dressing/undressing: 4: Min-Patient completed 75 plus % of tasks FIM - Lower  Body Dressing/Undressing Lower body dressing/undressing steps patient completed: Thread/unthread right underwear leg;Thread/unthread left underwear leg;Thread/unthread right pants leg Lower body dressing/undressing: 3: Mod-Patient completed 50-74% of tasks  FIM - Toileting Toileting steps completed by patient: Adjust clothing prior to toileting Toileting Assistive Devices: Grab bar or rail for support Toileting: 2: Max-Patient completed 1 of 3 steps  FIM - Diplomatic Services operational officer Devices: Art gallery manager Transfers: 3-To toilet/BSC: Mod A (lift or lower assist);3-From toilet/BSC: Mod A (lift or lower assist)  FIM - Bed/Chair Transfer Bed/Chair Transfer Assistive Devices: Manufacturing systems engineer Transfer: 3: Supine > Sit: Mod A (lifting assist/Pt. 50-74%/lift 2 legs;4: Sit > Supine: Min A (steadying pt. > 75%/lift 1 leg);3: Bed > Chair or W/C: Mod A (lift or lower assist);3: Chair or W/C > Bed: Mod A (lift or lower assist)  FIM - Locomotion: Wheelchair Distance: Attempted w/c mobility training with hand over hand cues for UE propulsion sequence but unable to maintain straight path; total A 150 on unit Locomotion: Wheelchair: 1: Total Assistance/staff pushes wheelchair (Pt<25%) FIM - Locomotion: Ambulation Locomotion: Ambulation Assistive Devices: Designer, industrial/product Ambulation/Gait Assistance: 3: Mod assist Locomotion: Ambulation: 1: Travels less than 50 ft with moderate assistance (Pt: 50 - 74%)  Comprehension Comprehension Mode: Auditory Comprehension: 5-Understands complex 90% of the time/Cues < 10% of the time  Expression Expression Mode: Verbal Expression: 4-Expresses basic 75 - 89% of the time/requires cueing 10 - 24% of the time. Needs helper to occlude trach/needs to repeat words.  Social Interaction Social Interaction Mode: Not assessed Social Interaction: 5-Interacts appropriately 90% of the time - Needs monitoring or encouragement for participation or  interaction.  Problem Solving Problem Solving: 4-Solves basic 75 - 89% of the time/requires cueing 10 - 24% of the time  Memory Memory: 4-Recognizes or recalls 75 - 89% of the time/requires cueing 10 - 24% of the time  2. Anticoagulation/DVT prophylaxis with Pharmaceutical: Coumadin 3. Pain Management: acetominophen Medical Problem List and Plan:  1. 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 Myxoma. 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 DVT Prophylaxis/Anticoagulation: Coumadin per pharmacy. 4. Paroxysmal atrial fibrillation. Amiodarone 400 mg twice a day, Lopressor 100 mg twice a day. Monitor with increased mobility, some low BPs will reduce lopressor 5. CHF. Lasix 40 mg twice a day. Monitor for any signs of fluid overload. Daily weights , wean O2 6. Constipation will adjust laxative medications 7.  Hypoglycemia  serum glucose 110pm yest was normal.  CBGs ok LOS (Days) 6 A FACE TO FACE EVALUATION WAS PERFORMED  Adelynn Gipe E 01/08/2012, 8:36 AM

## 2012-01-08 NOTE — Progress Notes (Signed)
Occupational Therapy Session Note  Patient Details  Name: Courtney Faulkner MRN: [id number] Date of Birth: January 30, 1951  Today's Date: 01/08/2012 Time: 0935-1020 Time Calculation (min): 45 min   Skilled Therapeutic Interventions/Progress Updates: Patient seen this am for bathing and dressing seated at sink. Emphasis today was on improving cardiopulmonary endurance and decreasing external 02 support.  Patient with flat affect, although did engage in discussion regarding work history and leisure interests.  Patient expressing desire to leave the hospital, and feels that she will have an improvement in mood post discharge.  Patient became short of breath with activity, yet able to maintain O2 saturation between above 90% with bathing and dressing task.  Patient had one dip to 88%, but recovered to 91% within 5 seconds of seated rest.      Therapy Documentation Precautions:  Fall, 02 (trial with room air 01/08/12)    Pain: Pain in right breast / chest at suture sites   See FIM for current functional status  Therapy/Group: Individual Therapy  Collier Salina 01/08/2012, 2:23 PM

## 2012-01-08 NOTE — Progress Notes (Signed)
Recreational Therapy Session Note  Patient Details  Name: Courtney Faulkner MRN: [id number] Date of Birth: [birth year] Today's Date: 01/08/2012 Time: 1130-12 Pain: no c/o  Skilled Therapeutic Interventions/Progress Updates: Pt stated frustration and being mad with scheduled d/c date of next Friday, wants to leave sooner.  Emotional support provided.  Pt also with low CBG prior to session (59) and eating snack upon entering room.  CBG rechecked: 68.    Pt stood with RW for balance activity alternating feet to kick a ball with min-mod assist.  Pt required moderate instructional, visual, tactile cues to attend to RW position, RLE and RUE position and for safe sequence during all mobility/standing tasks.   Therapy/Group: Co-Treatment  Derion Kreiter 01/08/2012, 4:25 PM

## 2012-01-08 NOTE — Significant Event (Signed)
CBG: 59  Treatment: 15 GM carbohydrate snack  Symptoms: None  Follow-up CBG: Time:1145 CBG Result:68  Possible Reasons for Event: Unknown  Comments/MD notified:No    Hedy Camara

## 2012-01-08 NOTE — Significant Event (Signed)
CBG: 68 Treatment: 15 GM carbohydrate snack  Symptoms: None  Follow-up CBG: Time:1226 CBG Result:79  Possible Reasons for Event: Unknown  Comments/MD notified:No    Hedy Camara

## 2012-01-08 NOTE — Progress Notes (Signed)
Physical Therapy Session Note  Patient Details  Name: Courtney Faulkner MRN: [id number] Date of Birth: April 02, 1951  Today's Date: 01/08/2012 Time: 1131-1202 Time Calculation (min): 31 min  Short Term Goals: Week 1:  PT Short Term Goal 1 (Week 1): Patient will perform bed mobility flat bed with supervision to R and L PT Short Term Goal 2 (Week 1): Patient will perform bed <> w/c transfers with min A to R and L PT Short Term Goal 3 (Week 1): Patient will perform w/c mobility on unit with bilat UE propulsion with min A x 150' PT Short Term Goal 4 (Week 1): Patient will perform gait training on unit with LRAD x 50' with min A PT Short Term Goal 5 (Week 1): Patient will perform up and down 3 stairs with one rail and mod A  Skilled Therapeutic Interventions/Progress Updates:   Following conference patient made aware of D/C date: 01/17/12; patient reporting that she is mad and frustrated about D/C date and would like to go home sooner; consoled patient and acknowledged her frustration and encouraged patient that the time here would be used to make her as safe and independent as possible and to manage all of her medical complications.    Therapy Documentation Precautions:  Precautions Precautions: Fall Precaution Comments: Maintain Sp02 >90% on 2L 02 [place]; monitor vitals Restrictions Weight Bearing Restrictions: No General:  Prior to therapy beginning patient's CBG was 59, allowed patient to finish snack and juice, CBG re-checked: 68 Vital Signs: Therapy Vitals Oxygen Therapy SpO2: 84% O2 Device: None (Room air) with activity; administered 2L02 and increased Sp02 to 92% Pain: Pain Assessment Pain Assessment: No/denies pain Pain Score: 0-No pain Balance:  Performed standing dynamic balance, single limb support and lateral weight shift training with bilat UE support and min-mod A to maintain balance during alternating LE ball kicks; added in intermittent retro and lateral stepping for training  for transfers; transfer from w/c sit <> stand and pivot back to w/c with mod A and verbal, visual and tactile cues to attend to RW position, RLE and RUE position and for safe sequence.  See FIM for current functional status  Therapy/Group: Individual Therapy  Edman Circle [facility] 01/08/2012, 12:21 PM

## 2012-01-08 NOTE — Care Management Note (Signed)
Clinical update faxed to Angie at [facility].

## 2012-01-09 LAB — GLUCOSE, CAPILLARY: Glucose-Capillary: 99 mg/dL (ref 70–99)

## 2012-01-09 MED ORDER — METOPROLOL TARTRATE 25 MG PO TABS
25.0000 mg | ORAL_TABLET | Freq: Two times a day (BID) | ORAL | Status: DC
  Administered 2012-01-09 – 2012-01-17 (×16): 25 mg via ORAL
  Filled 2012-01-09 (×18): qty 1

## 2012-01-09 NOTE — Progress Notes (Signed)
Occupational Therapy Session Note  Patient Details  Name: Courtney Faulkner MRN: [id number] Date of Birth: 01-25-1951  Today's Date: 01/09/2012 Time: 0932-1030 Time Calculation (min): 58 min  Short Term Goals: Week 1:  OT Short Term Goal 1 (Week 1): Pt. will be supervision with UB bath OT Short Term Goal 2 (Week 1): Pt. will be min assist with LB bathing OT Short Term Goal 3 (Week 1): Pt. will go from sit to stand with min. assist OT Short Term Goal 4 (Week 1): Pt. will transfer to toilet with min assist  Skilled Therapeutic Interventions/Progress Updates:    Pt seen for ADL retraining at sink from w/c level.  Focus on activity tolerance, sit to stand, stand pivot transfers, breathing techniques with activity, and techniques to increase independence with LB bathing and dsg.  Pt with decreased O2 sats 84% on room air with bending forward for LB bathing and dressing, able to rebound to 90% within 30 seconds to a minute with min verbal and demonstration cues for breathing technique.  Placed pt on 1L O2 throughout remainder of ADL session secondary to dropping sats with bending.  At rest in sitting pt at 98% on room air.  Therapy Documentation Precautions:  Precautions Precautions: Fall Precaution Comments: Maintain Sp02 >90% on 2L 02 [name]; monitor vitals Restrictions Weight Bearing Restrictions: No Pain: Pain Assessment Pain Assessment: No/denies pain Pain Score: 0-No pain  See FIM for current functional status  Therapy/Group: Individual Therapy  Leonette Monarch 01/09/2012, 12:29 PM

## 2012-01-09 NOTE — Progress Notes (Signed)
Physical Therapy Weekly Progress Note  Patient Details  Name: Courtney Faulkner MRN: [id number] Date of Birth: 10-09-1950  Today's Date: 01/09/2012 Time: 1100-1200 Time Calculation (min): 60 min  Patient is making slow but steady progress toward PT LTG and has met 0 of 5 short term goals.  Upon evaluation patient was max-total A for bed mobility, basic transfers, w/c mobility, and gait very short distances with RW and was significantly limited by decreased cardiorespiratory endurance and dizziness.  Patient also found to be at high risk for falls as indicated by Sharlene Motts balance score of 18/56.  Patient was placed on 15 hours of therapy/7 days to increase activity tolerance.  Patient is currently min-mod A overall for bed mobility flat bed, bed <> w/c transfers with RW, gait short distances in controlled environment, stairs and continues to require total A for w/c mobility.  Patient continues to demonstrate the following deficits: continued intermittent dizziness, low CBG, low 02 saturation requiring the continued use of supplemental 02 via [place] during activity, decreased activity tolerance and endurance, expressive aphasia with decreased attention, decreased attention to R side, impaired memory, awareness and problem solving, flat affect, R sided weakness, impaired motor control and sequencing, impaired balance and gait and therefore will continue to benefit from skilled PT intervention to enhance overall performance with activity tolerance, balance, ability to compensate for deficits, functional use of  right upper extremity and right lower extremity, attention, awareness, coordination and functional transfers, gait and stairs.  Patient's D/C date is set for 01/17/12 home with family 24/7 supervision and assistance.    Patient not progressing toward long term goals.  See goal revision. Plan of care revisions: Patient's goals downgraded to supervision-min A overall secondary to slow progress and continued  cognitive deficits affecting patient's safety with mobility.  PT Short Term Goals Week 1:  PT Short Term Goal 1 (Week 1): Patient will perform bed mobility flat bed with supervision to R and L PT Short Term Goal 1 - Progress (Week 1): Partly met PT Short Term Goal 2 (Week 1): Patient will perform bed <> w/c transfers with min A to R and L PT Short Term Goal 2 - Progress (Week 1): Partly met PT Short Term Goal 3 (Week 1): Patient will perform w/c mobility on unit with bilat UE propulsion with min A x 150' PT Short Term Goal 3 - Progress (Week 1): Partly met PT Short Term Goal 4 (Week 1): Patient will perform gait training on unit with LRAD x 50' with min A PT Short Term Goal 4 - Progress (Week 1): Partly met PT Short Term Goal 5 (Week 1): Patient will perform up and down 3 stairs with one rail and mod A PT Short Term Goal 5 - Progress (Week 1): Partly met Week 2:  PT Short Term Goal 1 (Week 2): =LTG of supervision-min A overall  Therapy Documentation Precautions:  Precautions Precautions: Fall Precaution Comments: Maintain Sp02 >90% on 2L 02 [place]; monitor vitals Restrictions Weight Bearing Restrictions: No Vital Signs: Oxygen Therapy SpO2: 95 % O2 Device: None (Room air) Pulse Oximetry Type: Intermittent; patient able to maintain Sp02 >90% on RA during all activity today; no supp 02 needed Pain: Pain Assessment Pain Assessment: No/denies pain Pain Score: 0-No pain Mobility:  Performed sit to stand training and w/c <> bed transfers with RW stand pivot with mod A still requiring verbal, visual and tactile cues for R foot placement, safe hand placement to push from surface sitting on, full anterior lean  to bring COG over BOS and for safe sequence when pivoting with RW.   Locomotion : Ambulation Ambulation/Gait Assistance: 3: Mod assist on level surface x 25' x 3 reps with mod A for L lateral weight shift, full step length and foot clearance RLE and activation of R extensors in stance,  verbal cues for attention to R side and to maintain safe distance to RW and for safe sequence when changing direction; fatigued quickly and required multiple rest breaks.  Stair training: up and down 3 short steps (4") with one-two UE support on rails with mod A to advance RLE to next step, stability during R stance and verbal cues for safe sequence; discussed with patient that she will likely need a rail for safe stair negotiation at home  Wheelchair Mobility Distance: 75 on level surface still requiring max hand over hand assist for RUE propulsion sequence and coordination of bilat UE to maintain straight navigation and correct when steering to the R.  See FIM for current functional status  Therapy/Group: Individual Therapy  Edman Circle [facility] 01/09/2012, 12:33 PM

## 2012-01-09 NOTE — Progress Notes (Signed)
Speech Language Pathology Daily Session Note  Patient Details  Name: Courtney Faulkner MRN: [id number] Date of Birth: 22-Dec-1950  Today's Date: 01/09/2012 Time: 7829-5621 Time Calculation (min): 40 min  Short Term Goals: Week 1: SLP Short Term Goal 1 (Week 1): Pt. will use strategies to increase accuracy during description and explanations of complex information with min verbal cueing. SLP Short Term Goal 1 - Progress (Week 1): Progressing toward goal SLP Short Term Goal 2 (Week 1): Pt. will increase intellectual awareness by stating details of diagnosis and medical interventions with mod verbal cues. SLP Short Term Goal 2 - Progress (Week 1): Progressing toward goal SLP Short Term Goal 3 (Week 1): Pt. will initiate and self correct paraphasias in conversation with min verbal question cues. SLP Short Term Goal 3 - Progress (Week 1): Progressing toward goal SLP Short Term Goal 4 (Week 1): Pt. will implement strategies to facilitate working and prospective memory with min verbal cues. SLP Short Term Goal 5 (Week 1): Pt. will demonstrate sustained attention to a familiar task for 5 minutes with minimal assist semantic cues.  Skilled Therapeutic Interventions: Session focused on speech-language and cognition treatment; SLP facilitated session with max assist semantic, visual and tactile cues to perform diaphragmatic breathing with SpO2 readings while on room air between 93-97%; SLP also facilitated session with barrier activity that required patient to utilize description of professions with moderate assist semantic cues to follow procedures and mod assist semantic cues to self monitor and correct during activity.  Patient required mod assist to problem solve what to do after an incontinent episode during session and verbally expressed needs over call bell with min assist.    Daily Session Precautions/Restrictions  Restrictions Weight Bearing Restrictions: No FIM:  Comprehension Comprehension  Mode: Auditory Comprehension: 5-Understands basic 90% of the time/requires cueing < 10% of the time Expression Expression Mode: Verbal Expression: 4-Expresses basic 75 - 89% of the time/requires cueing 10 - 24% of the time. Needs helper to occlude trach/needs to repeat words. Social Interaction Social Interaction: 4-Interacts appropriately 75 - 89% of the time - Needs redirection for appropriate language or to initiate interaction. Problem Solving Problem Solving: 3-Solves basic 50 - 74% of the time/requires cueing 25 - 49% of the time Memory Memory: 4-Recognizes or recalls 75 - 89% of the time/requires cueing 10 - 24% of the time General    Pain Pain Assessment Pain Assessment: No/denies pain Pain Score: 0-No pain  Therapy/Group: Individual Therapy   Speech Language Pathology Weekly Progress Note  Patient Details  Name: Courtney Faulkner MRN: [id number] Date of Birth: September 03, 1950  Today's Date: 01/09/2012  Short Term Goals: Week 1: SLP Short Term Goal 1 (Week 1): Pt. will use strategies to increase accuracy during description and explanations of complex information with min verbal cueing. SLP Short Term Goal 1 - Progress (Week 1): Progressing toward goal SLP Short Term Goal 2 (Week 1): Pt. will increase intellectual awareness by stating details of diagnosis and medical interventions with mod verbal cues. SLP Short Term Goal 2 - Progress (Week 1): Progressing toward goal SLP Short Term Goal 3 (Week 1): Pt. will initiate and self correct paraphasias in conversation with min verbal question cues. SLP Short Term Goal 3 - Progress (Week 1): Progressing toward goal SLP Short Term Goal 4 (Week 1): Pt. will implement strategies to facilitate working and prospective memory with min verbal cues. SLP Short Term Goal 4 - Progress (Week 4): Progressing toward goal SLP Short Term Goal 5 (Week  1): Pt. will demonstrate sustained attention to a familiar task for 5 minutes with minimal assist  semantic cues. SLP Short Term Goal 5 - Progress (Week 1): Met Week 2: SLP Short Term Goal 1 (Week 2): Patient will use strategies to increase accuracy during description and explanations of complex information with min verbal cueing. SLP Short Term Goal 2 (Week 2): Patient will increase intellectual awareness by stating details of diagnosis and medical interventions with mod verbal cues. SLP Short Term Goal 3 (Week 2): Patient will initiate and self correct paraphasias in conversation with min verbal question cues. SLP Short Term Goal 4 (Week 2): Patient will utilize memory compensatory strategies/aids with min verbal cues.   Weekly Progress Updates: Patient met 1 out of 5 short term objectives this reporting period with gains in ability to attend to tasks with and without distractions present.  However, patient's decreased awareness (max assist) of deficits has greatly impacted her ability to recognize and therefore correct errors both verbal and cognitive deficits (mod assist).  As a result this patient would continue to benefit form skilled SLP services and to maximize functional independence and reduce burden of care.   SLP Frequency: 1-2 X/day, 30-60 minutes;5 out of 7 days SLP Treatment/Interventions: Cognitive remediation/compensation;Cueing hierarchy;Environmental controls;Functional tasks;Internal/external aids;Medication managment;Patient/family education;Speech/Language facilitation;Therapeutic Activities;Therapeutic Exercise  Daily Session Precautions/Restrictions  Restrictions Weight Bearing Restrictions: No Cognition: Overall Cognitive Status: Impaired Arousal/Alertness: Awake/alert Orientation Level: Oriented X4 Attention: Selective Sustained Attention: Appears intact Selective Attention: Appears intact Memory: Impaired Memory Impairment: Decreased recall of new information;Decreased short term memory;Storage deficit;Retrieval deficit Awareness: Impaired Awareness Impairment:  Intellectual impairment Problem Solving: Impaired Problem Solving Impairment: Functional basic;Verbal complex Executive Function: Reasoning;Decision Making;Self Monitoring;Self Correcting Reasoning: Impaired Organizing: Impaired Organizing Impairment: Functional basic Decision Making: Impaired Decision Making Impairment: Functional basic Self Monitoring: Impaired Self Monitoring Impairment: Verbal complex Self Correcting: Impaired Self Correcting Impairment: Verbal basic Safety/Judgment: Impaired Oral/Motor: Oral Motor/Sensory Function Overall Oral Motor/Sensory Function: Appears within functional limits for tasks assessed Labial ROM: Reduced right Labial Symmetry: Abnormal symmetry right Lingual ROM: Within Functional Limits Lingual Symmetry: Abnormal symmetry left Facial ROM: Within Functional Limits Mandible: Within Functional Limits Motor Speech Overall Motor Speech: Impaired Respiration: Impaired Level of Impairment: Phrase Phonation: Normal Resonance: Within functional limits Articulation: Within functional limitis Intelligibility: Intelligible Motor Planning: Witnin functional limits Motor Speech Errors: Not applicable Comprehension: Auditory Comprehension Overall Auditory Comprehension: Impaired Yes/No Questions: Within Functional Limits Commands: Within Functional Limits Multistep Basic Commands: 50-74% accurate Conversation: Simple Interfering Components: Attention;Processing speed EffectiveTechniques: Extra processing time Visual Recognition/Discrimination Discrimination: Not tested Reading Comprehension Reading Status: Within funtional limits (with basic info) Expression: Expression Primary Mode of Expression: Verbal Verbal Expression Overall Verbal Expression: Impaired Initiation: No impairment Level of Generative/Spontaneous Verbalization: Conversation Repetition: No impairment Naming: Impairment Responsive: 51-75% accurate Confrontation:  Impaired Convergent: Not tested Divergent: Not tested Verbal Errors: Semantic paraphasias;Phonemic paraphasias;Other (comment) (not consistently aware of errors) Pragmatics: Impairment Impairments: Abnormal affect Non-Verbal Means of Communication: Not applicable Written Expression Dominant Hand: Right Written Expression: Exceptions to [facility] Self Formulation Ability: Letter  Fae Pippin, M.A., CCC-SLP [phone]  Lorriane Dehart 01/09/2012, 10:41 AM

## 2012-01-09 NOTE — Progress Notes (Signed)
Patient ID: Courtney Faulkner, female   DOB: May 17, 1951, 61 y.o.   MRN: [id number]  Subjective/Complaints: Unable to spell WORLD backwards.  O2 sat dropped again off O2 Review of Systems  Gastrointestinal: Positive for constipation.  Genitourinary: Positive for urgency.    Objective: Vital Signs: Blood pressure 104/70, pulse 78, temperature 98.6 F (37 C), temperature source Oral, resp. rate 19, height 5\' 3"  (1.6 m), weight 87.3 kg (192 lb 7.4 oz), SpO2 97.00%. No results found. Results for orders placed during the hospital encounter of 01/02/12 (from the past 72 hour(s))  GLUCOSE, CAPILLARY     Status: Abnormal   Collection Time   01/06/12 12:02 PM      Component Value Range Comment   Glucose-Capillary 41 (*) 70 - 99 (mg/dL)    Comment 1 Notify RN     GLUCOSE, CAPILLARY     Status: Abnormal   Collection Time   01/06/12 12:29 PM      Component Value Range Comment   Glucose-Capillary 35 (*) 70 - 99 (mg/dL)    Comment 1 Notify RN     GLUCOSE, CAPILLARY     Status: Abnormal   Collection Time   01/06/12 12:52 PM      Component Value Range Comment   Glucose-Capillary 29 (*) 70 - 99 (mg/dL)    Comment 1 Notify RN     GLUCOSE, CAPILLARY     Status: Normal   Collection Time   01/06/12  1:25 PM      Component Value Range Comment   Glucose-Capillary 97  70 - 99 (mg/dL)   GLUCOSE, RANDOM     Status: Abnormal   Collection Time   01/06/12  1:55 PM      Component Value Range Comment   Glucose, Bld 122 (*) 70 - 99 (mg/dL)   GLUCOSE, CAPILLARY     Status: Normal   Collection Time   01/06/12  4:24 PM      Component Value Range Comment   Glucose-Capillary 89  70 - 99 (mg/dL)   GLUCOSE, CAPILLARY     Status: Abnormal   Collection Time   01/06/12  9:13 PM      Component Value Range Comment   Glucose-Capillary 103 (*) 70 - 99 (mg/dL)   PROTIME-INR     Status: Abnormal   Collection Time   01/07/12  5:46 AM      Component Value Range Comment   Prothrombin Time 28.8 (*) 11.6 - 15.2 (seconds)      INR 2.66 (*) 0.00 - 1.49    GLUCOSE, CAPILLARY     Status: Normal   Collection Time   01/07/12  7:05 AM      Component Value Range Comment   Glucose-Capillary 94  70 - 99 (mg/dL)    Comment 1 Notify RN     GLUCOSE, CAPILLARY     Status: Normal   Collection Time   01/07/12 11:47 AM      Component Value Range Comment   Glucose-Capillary 75  70 - 99 (mg/dL)    Comment 1 Notify RN      Comment 2 Orig Pt Id entered as [id number]     BASIC METABOLIC PANEL     Status: Abnormal   Collection Time   01/07/12  1:10 PM      Component Value Range Comment   Sodium 136  135 - 145 (mEq/L)    Potassium 3.7  3.5 - 5.1 (mEq/L)    Chloride 100  96 -  112 (mEq/L)    CO2 27  19 - 32 (mEq/L)    Glucose, Bld 116 (*) 70 - 99 (mg/dL)    BUN 10  6 - 23 (mg/dL)    Creatinine, Ser 2.13  0.50 - 1.10 (mg/dL)    Calcium 9.2  8.4 - 10.5 (mg/dL)    GFR calc non Af Amer >90  >90 (mL/min)    GFR calc Af Amer >90  >90 (mL/min)   GLUCOSE, CAPILLARY     Status: Abnormal   Collection Time   01/07/12  3:46 PM      Component Value Range Comment   Glucose-Capillary 103 (*) 70 - 99 (mg/dL)   GLUCOSE, CAPILLARY     Status: Normal   Collection Time   01/07/12  9:39 PM      Component Value Range Comment   Glucose-Capillary 97  70 - 99 (mg/dL)    Comment 1 Notify RN     PROTIME-INR     Status: Abnormal   Collection Time   01/08/12  4:50 AM      Component Value Range Comment   Prothrombin Time 29.3 (*) 11.6 - 15.2 (seconds)    INR 2.72 (*) 0.00 - 1.49    GLUCOSE, CAPILLARY     Status: Normal   Collection Time   01/08/12  7:31 AM      Component Value Range Comment   Glucose-Capillary 85  70 - 99 (mg/dL)   GLUCOSE, CAPILLARY     Status: Abnormal   Collection Time   01/08/12 11:14 AM      Component Value Range Comment   Glucose-Capillary 59 (*) 70 - 99 (mg/dL)   GLUCOSE, CAPILLARY     Status: Abnormal   Collection Time   01/08/12 11:45 AM      Component Value Range Comment   Glucose-Capillary 68 (*) 70 - 99 (mg/dL)     Comment 1 Notify RN     GLUCOSE, CAPILLARY     Status: Normal   Collection Time   01/08/12 12:25 PM      Component Value Range Comment   Glucose-Capillary 79  70 - 99 (mg/dL)   GLUCOSE, CAPILLARY     Status: Normal   Collection Time   01/08/12  1:18 PM      Component Value Range Comment   Glucose-Capillary 75  70 - 99 (mg/dL)   GLUCOSE, CAPILLARY     Status: Normal   Collection Time   01/08/12  4:45 PM      Component Value Range Comment   Glucose-Capillary 88  70 - 99 (mg/dL)   GLUCOSE, CAPILLARY     Status: Normal   Collection Time   01/08/12  9:04 PM      Component Value Range Comment   Glucose-Capillary 83  70 - 99 (mg/dL)   GLUCOSE, CAPILLARY     Status: Normal   Collection Time   01/09/12  7:19 AM      Component Value Range Comment   Glucose-Capillary 86  70 - 99 (mg/dL)    Comment 1 Notify RN        HEENT: normal Cardio: RRR Resp: CTA B/L GI: BS positive Extremity:  No Edema Skin:   Intact Neuro: Flat, Abnormal Motor 2-/5 in R delt,bi,tri,grip,3/5 R HF,KE,Ankle DF, 5/5 on L side and Abnormal FMC Ataxic/ dec FMC Musc/Skel:  Normal   Assessment/Plan: 1. Functional deficits secondary to L ICA infarct which require 3+ hours per day of interdisciplinary therapy in a comprehensive  inpatient rehab setting. Physiatrist is providing close team supervision and 24 hour management of active medical problems listed below. Physiatrist and rehab team continue to assess barriers to discharge/monitor patient progress toward functional and medical goals. FIM: FIM - Bathing Bathing Steps Patient Completed: Chest;Right Arm;Left Arm;Abdomen;Front perineal area;Right upper leg;Left upper leg Bathing: 3: Mod-Patient completes 5-7 48f 10 parts or 50-74%  FIM - Upper Body Dressing/Undressing Upper body dressing/undressing steps patient completed: Thread/unthread right bra strap;Thread/unthread left bra strap;Thread/unthread right sleeve of pullover shirt/dresss;Thread/unthread left sleeve  of pullover shirt/dress;Put head through opening of pull over shirt/dress Upper body dressing/undressing: 3: Mod-Patient completed 50-74% of tasks FIM - Lower Body Dressing/Undressing Lower body dressing/undressing steps patient completed: Pull pants up/down;Thread/unthread left pants leg Lower body dressing/undressing: 2: Max-Patient completed 25-49% of tasks (Trial with room air)  FIM - Toileting Toileting steps completed by patient: Adjust clothing prior to toileting Toileting Assistive Devices: Grab bar or rail for support Toileting: 2: Max-Patient completed 1 of 3 steps  FIM - Diplomatic Services operational officer Devices: Art gallery manager Transfers:  (declined need)  FIM - Banker Devices: Therapist, occupational: 3: Bed > Chair or W/C: Mod A (lift or lower assist);3: Chair or W/C > Bed: Mod A (lift or lower assist)  FIM - Locomotion: Wheelchair Distance: Attempted w/c mobility training with hand over hand cues for UE propulsion sequence but unable to maintain straight path; total A 150 on unit Locomotion: Wheelchair: 1: Total Assistance/staff pushes wheelchair (Pt<25%) FIM - Locomotion: Ambulation Locomotion: Ambulation Assistive Devices: Designer, industrial/product Ambulation/Gait Assistance: 3: Mod assist Locomotion: Ambulation: 0: Activity did not occur  Comprehension Comprehension Mode: Auditory Comprehension: 5-Understands basic 90% of the time/requires cueing < 10% of the time  Expression Expression Mode: Verbal Expression: 3-Expresses basic 50 - 74% of the time/requires cueing 25 - 50% of the time. Needs to repeat parts of sentences.  Social Interaction Social Interaction Mode: Not assessed Social Interaction: 4-Interacts appropriately 75 - 89% of the time - Needs redirection for appropriate language or to initiate interaction.  Problem Solving Problem Solving: 4-Solves basic 75 - 89% of the time/requires cueing 10 - 24% of the  time  Memory Memory: 4-Recognizes or recalls 75 - 89% of the time/requires cueing 10 - 24% of the time  2. Anticoagulation/DVT prophylaxis with Pharmaceutical: Coumadin 3. Pain Management: acetominophen Medical Problem List and Plan:  1. 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 Myxoma. 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 DVT Prophylaxis/Anticoagulation: Coumadin per pharmacy. 4. Paroxysmal atrial fibrillation. Amiodarone 400 mg twice a day, Lopressor reduce to 25 mg twice a day. Monitor with increased mobility, some low BPs will reduce lopressor 5. CHF. Lasix 40 mg twice a day. Monitor for any signs of fluid overload. Daily weights , wean O2 6. Constipation will adjust laxative medications 7.  Hypoglycemia  serum glucose 110pm yest was normal.  CBGs ok LOS (Days) 7 A FACE TO FACE EVALUATION WAS PERFORMED  Freeland Pracht E 01/09/2012, 7:50 AM

## 2012-01-09 NOTE — Plan of Care (Signed)
Problem: RH BOWEL ELIMINATION Goal: RH STG MANAGE BOWEL WITH ASSISTANCE STG Manage Bowel with mod assistance.  Outcome: Not Progressing Pt has episodes of bowel incontinence  Problem: RH BLADDER ELIMINATION Goal: RH STG MANAGE BLADDER WITH ASSISTANCE STG Manage Bladder With mod assistance.  Outcome: Not Progressing Pt has episodes of bladder incontince

## 2012-01-10 DIAGNOSIS — I6330 Cerebral infarction due to thrombosis of unspecified cerebral artery: Secondary | ICD-10-CM

## 2012-01-10 DIAGNOSIS — G8110 Spastic hemiplegia affecting unspecified side: Secondary | ICD-10-CM

## 2012-01-10 DIAGNOSIS — Z5189 Encounter for other specified aftercare: Secondary | ICD-10-CM

## 2012-01-10 LAB — GLUCOSE, CAPILLARY: Glucose-Capillary: 90 mg/dL (ref 70–99)

## 2012-01-10 LAB — PROTIME-INR: INR: 2.03 — ABNORMAL HIGH (ref 0.00–1.49)

## 2012-01-10 LAB — CBC
Hemoglobin: 9.7 g/dL — ABNORMAL LOW (ref 12.0–15.0)
MCHC: 31.6 g/dL (ref 30.0–36.0)
RBC: 3.28 MIL/uL — ABNORMAL LOW (ref 3.87–5.11)
WBC: 8.4 10*3/uL (ref 4.0–10.5)

## 2012-01-10 MED ORDER — AMIODARONE HCL 200 MG PO TABS
200.0000 mg | ORAL_TABLET | Freq: Every day | ORAL | Status: DC
  Administered 2012-01-11 – 2012-01-17 (×7): 200 mg via ORAL
  Filled 2012-01-10 (×9): qty 1

## 2012-01-10 MED ORDER — WARFARIN SODIUM 5 MG PO TABS
5.0000 mg | ORAL_TABLET | ORAL | Status: DC
  Administered 2012-01-10 – 2012-01-13 (×2): 5 mg via ORAL
  Filled 2012-01-10 (×2): qty 1

## 2012-01-10 MED ORDER — WARFARIN SODIUM 2.5 MG PO TABS
2.5000 mg | ORAL_TABLET | ORAL | Status: DC
  Administered 2012-01-11 – 2012-01-12 (×2): 2.5 mg via ORAL
  Filled 2012-01-10 (×3): qty 1

## 2012-01-10 NOTE — Progress Notes (Signed)
Social Work Patient ID: Courtney Faulkner, female   DOB: Jan 16, 1951, 61 y.o.   MRN: [id number] Met with pt and daughter pt reports: " Not having a good day."  Daughter reports she has been short of breath and Puzzled as to reason for this.  Discussed will work on discharge plans, may begin home health and progress to OP Therapies.  Continue to work toward discharge next Friday.  Pt reports: " I am trying."

## 2012-01-10 NOTE — Progress Notes (Signed)
Monitoring patient oxygen saturation through night on room air. At approximately 0345 am patient O2 sats at 88% on room air. Patient placed on 1 liter of humidified O2 per nasal cannula. Will continue to monitor patient.

## 2012-01-10 NOTE — Progress Notes (Signed)
Patient ID: Courtney Faulkner, female   DOB: August 10, 1951, 61 y.o.   MRN: [id number]  Subjective/Complaints: Continent of stool.  O2 sat dropped again off O2 at noc Review of Systems  Gastrointestinal: Positive for constipation.  Genitourinary: Positive for urgency.   Objective: Vital Signs: Blood pressure 108/69, pulse 95, temperature 98.6 F (37 C), temperature source Oral, resp. rate 20, height 5\' 3"  (1.6 m), weight 86.5 kg (190 lb 11.2 oz), SpO2 97.00%. No results found. Results for orders placed during the hospital encounter of 01/02/12 (from the past 72 hour(s))  GLUCOSE, CAPILLARY     Status: Normal   Collection Time   01/07/12 11:47 AM      Component Value Range Comment   Glucose-Capillary 75  70 - 99 (mg/dL)    Comment 1 Notify RN      Comment 2 Orig Pt Id entered as [id number]     BASIC METABOLIC PANEL     Status: Abnormal   Collection Time   01/07/12  1:10 PM      Component Value Range Comment   Sodium 136  135 - 145 (mEq/L)    Potassium 3.7  3.5 - 5.1 (mEq/L)    Chloride 100  96 - 112 (mEq/L)    CO2 27  19 - 32 (mEq/L)    Glucose, Bld 116 (*) 70 - 99 (mg/dL)    BUN 10  6 - 23 (mg/dL)    Creatinine, Ser 7.82  0.50 - 1.10 (mg/dL)    Calcium 9.2  8.4 - 10.5 (mg/dL)    GFR calc non Af Amer >90  >90 (mL/min)    GFR calc Af Amer >90  >90 (mL/min)   GLUCOSE, CAPILLARY     Status: Abnormal   Collection Time   01/07/12  3:46 PM      Component Value Range Comment   Glucose-Capillary 103 (*) 70 - 99 (mg/dL)   GLUCOSE, CAPILLARY     Status: Normal   Collection Time   01/07/12  9:39 PM      Component Value Range Comment   Glucose-Capillary 97  70 - 99 (mg/dL)    Comment 1 Notify RN     PROTIME-INR     Status: Abnormal   Collection Time   01/08/12  4:50 AM      Component Value Range Comment   Prothrombin Time 29.3 (*) 11.6 - 15.2 (seconds)    INR 2.72 (*) 0.00 - 1.49    GLUCOSE, CAPILLARY     Status: Normal   Collection Time   01/08/12  7:31 AM      Component Value Range  Comment   Glucose-Capillary 85  70 - 99 (mg/dL)   GLUCOSE, CAPILLARY     Status: Abnormal   Collection Time   01/08/12 11:14 AM      Component Value Range Comment   Glucose-Capillary 59 (*) 70 - 99 (mg/dL)   GLUCOSE, CAPILLARY     Status: Abnormal   Collection Time   01/08/12 11:45 AM      Component Value Range Comment   Glucose-Capillary 68 (*) 70 - 99 (mg/dL)    Comment 1 Notify RN     GLUCOSE, CAPILLARY     Status: Normal   Collection Time   01/08/12 12:25 PM      Component Value Range Comment   Glucose-Capillary 79  70 - 99 (mg/dL)   GLUCOSE, CAPILLARY     Status: Normal   Collection Time   01/08/12  1:18 PM  Component Value Range Comment   Glucose-Capillary 75  70 - 99 (mg/dL)   GLUCOSE, CAPILLARY     Status: Normal   Collection Time   01/08/12  4:45 PM      Component Value Range Comment   Glucose-Capillary 88  70 - 99 (mg/dL)   GLUCOSE, CAPILLARY     Status: Normal   Collection Time   01/08/12  9:04 PM      Component Value Range Comment   Glucose-Capillary 83  70 - 99 (mg/dL)   GLUCOSE, CAPILLARY     Status: Normal   Collection Time   01/09/12  7:19 AM      Component Value Range Comment   Glucose-Capillary 86  70 - 99 (mg/dL)    Comment 1 Notify RN     GLUCOSE, CAPILLARY     Status: Normal   Collection Time   01/09/12 11:39 AM      Component Value Range Comment   Glucose-Capillary 87  70 - 99 (mg/dL)    Comment 1 Notify RN     GLUCOSE, CAPILLARY     Status: Normal   Collection Time   01/09/12  4:27 PM      Component Value Range Comment   Glucose-Capillary 97  70 - 99 (mg/dL)   GLUCOSE, CAPILLARY     Status: Normal   Collection Time   01/09/12  8:53 PM      Component Value Range Comment   Glucose-Capillary 99  70 - 99 (mg/dL)   PROTIME-INR     Status: Abnormal   Collection Time   01/10/12  5:38 AM      Component Value Range Comment   Prothrombin Time 23.3 (*) 11.6 - 15.2 (seconds)    INR 2.03 (*) 0.00 - 1.49       HEENT: normal Cardio: RRR Resp: CTA  B/L GI: BS positive Extremity:  No Edema Skin:   Intact Neuro: Flat, Abnormal Motor 2-/5 in R delt,bi,tri,grip,3/5 R HF,KE,Ankle DF, 5/5 on L side and Abnormal FMC Ataxic/ dec FMC Musc/Skel:  Normal   Assessment/Plan: 1. Functional deficits secondary to L ICA infarct which require 3+ hours per day of interdisciplinary therapy in a comprehensive inpatient rehab setting. Physiatrist is providing close team supervision and 24 hour management of active medical problems listed below. Physiatrist and rehab team continue to assess barriers to discharge/monitor patient progress toward functional and medical goals. FIM: FIM - Bathing Bathing Steps Patient Completed: Chest;Right Arm;Left Arm;Abdomen;Front perineal area;Right upper leg;Left upper leg Bathing: 3: Mod-Patient completes 5-7 63f 10 parts or 50-74%  FIM - Upper Body Dressing/Undressing Upper body dressing/undressing steps patient completed: Thread/unthread right sleeve of pullover shirt/dresss;Thread/unthread left sleeve of pullover shirt/dress;Put head through opening of pull over shirt/dress;Thread/unthread right bra strap;Thread/unthread left bra strap Upper body dressing/undressing: 3: Mod-Patient completed 50-74% of tasks FIM - Lower Body Dressing/Undressing Lower body dressing/undressing steps patient completed: Thread/unthread right underwear leg;Thread/unthread left underwear leg;Thread/unthread left pants leg;Pull underwear up/down Lower body dressing/undressing: 3: Mod-Patient completed 50-74% of tasks  FIM - Toileting Toileting steps completed by patient: Performs perineal hygiene Toileting Assistive Devices: Grab bar or rail for support Toileting: 2: Max-Patient completed 1 of 3 steps  FIM - Diplomatic Services operational officer Devices: Elevated toilet seat Toilet Transfers: 4-To toilet/BSC: Min A (steadying Pt. > 75%);4-From toilet/BSC: Min A (steadying Pt. > 75%)  FIM - Bed/Chair Transfer Bed/Chair Transfer  Assistive Devices: Therapist, occupational: 5: Supine > Sit: Supervision (verbal cues/safety issues);5: Sit > Supine: Supervision (  verbal cues/safety issues);3: Bed > Chair or W/C: Mod A (lift or lower assist);3: Chair or W/C > Bed: Mod A (lift or lower assist)  FIM - Locomotion: Wheelchair Distance: 75 Locomotion: Wheelchair: 2: Travels 50 - 149 ft with maximal assistance (Pt: 25 - 49%) FIM - Locomotion: Ambulation Locomotion: Ambulation Assistive Devices: Designer, industrial/product Ambulation/Gait Assistance: 3: Mod assist Locomotion: Ambulation: 1: Travels less than 50 ft with moderate assistance (Pt: 50 - 74%)  Comprehension Comprehension Mode: Auditory Comprehension: 5-Understands basic 90% of the time/requires cueing < 10% of the time  Expression Expression Mode: Verbal Expression: 4-Expresses basic 75 - 89% of the time/requires cueing 10 - 24% of the time. Needs helper to occlude trach/needs to repeat words.  Social Interaction Social Interaction Mode: Not assessed Social Interaction: 4-Interacts appropriately 75 - 89% of the time - Needs redirection for appropriate language or to initiate interaction.  Problem Solving Problem Solving: 3-Solves basic 50 - 74% of the time/requires cueing 25 - 49% of the time  Memory Memory: 4-Recognizes or recalls 75 - 89% of the time/requires cueing 10 - 24% of the time  2. Anticoagulation/DVT prophylaxis with Pharmaceutical: Coumadin 3. Pain Management: acetominophen Medical Problem List and Plan:  1. 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 Myxoma. 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 DVT Prophylaxis/Anticoagulation: Coumadin per pharmacy. 4. Paroxysmal atrial fibrillation. Amiodarone 400 mg twice a day, Lopressor reduce to 25 mg twice a day. Monitor with increased mobility, some low BPs will reduce lopressor 5. CHF. Lasix 40 mg twice a day. Monitor for any  signs of fluid overload. Daily weights , wean O2, still requiring nocturnal O2 recheck CXR if unable to wean further 6. Constipation will adjust laxative medications 7.  Hypoglycemia  By hx, recent  CBGs ok, MRI of pancreas without sign of tumor LOS (Days) 8 A FACE TO FACE EVALUATION WAS PERFORMED  Courtney Faulkner 01/10/2012, 8:11 AM

## 2012-01-10 NOTE — Progress Notes (Signed)
Therapist sat pt up on side of bed to eat breakfast, pt became dizzy, denies SOB, denies CP, HR irregular 120's to 130's, had pt lay back down in bed, BP 108/70 HR decreased to 89, Pt's CBG 90, PA Dan Finland Notified.  No new orders given at this time.

## 2012-01-10 NOTE — Progress Notes (Signed)
Physical Therapy Session Note  Patient Details  Name: Courtney Faulkner MRN: [id number] Date of Birth: [birth year]  Today's Date: 01/10/2012 Time: 1102-1200 Time Calculation (min): 58 min  Short Term Goals: Week 1:  PT Short Term Goal 1 (Week 1): Patient will perform bed mobility flat bed with supervision to R and L PT Short Term Goal 1 - Progress (Week 1): Partly met PT Short Term Goal 2 (Week 1): Patient will perform bed <> w/c transfers with min A to R and L PT Short Term Goal 2 - Progress (Week 1): Partly met PT Short Term Goal 3 (Week 1): Patient will perform w/c mobility on unit with bilat UE propulsion with min A x 150' PT Short Term Goal 3 - Progress (Week 1): Partly met PT Short Term Goal 4 (Week 1): Patient will perform gait training on unit with LRAD x 50' with min A PT Short Term Goal 4 - Progress (Week 1): Partly met PT Short Term Goal 5 (Week 1): Patient will perform up and down 3 stairs with one rail and mod A PT Short Term Goal 5 - Progress (Week 1): Partly met Week 2:  PT Short Term Goal 1 (Week 2): =LTG of supervision-min A overall  Therapy Documentation Precautions:  Precautions Precautions: Fall Precaution Comments: maintatin SpO2 > 90% with trials of room air.  Monitor vitals Restrictions Weight Bearing Restrictions: No Vital Signs: Therapy Vitals Pulse Rate: 90  Oxygen Therapy SpO2: 95 % O2 Device: None (Room air) Pulse Oximetry Type: Intermittent Pain: Pain Assessment Pain Assessment: No/denies pain Pain Score: 0-No pain Mobility:  Patient performed bed mobility on hospital bed sup <> sit with supervision with verbal cues for repositioning in bed.  Bed <> w/c, w/c <> Nustep and multiple stands from chairs and w/c <> toilet all with RW stand pivot with min A overall but with mod-max verbal cues for attention to R foot and UE, intermittent cues for hand placement on arm rests to push, and for full anterior lean to bring COG over BOS; intermittent verbal cues  to attend to R side when navigating with RW secondary to intermittent episodes of running into objects on R side with RW.  Patient stood at toilet with supervision-min A and assisted with clothing doffing, donning with cues to use RUE; patient used LUE to complete hygiene   Locomotion : Ambulation Ambulation/Gait Assistance: 4: Pregait training in // bars with R single limb stance and bilat UE support for training of activation of RLE extensors in stance; use of object on floor for visual feedback for full R step length and foot clearance and use of resistance band to cue hip and knee extension for anterior translation of COG over stance LE; Carry over to gait training with RW and Min assist x 25' with verbal cues and improved step and stride length, heel strike and gait speed  Exercises:   Bilat UE and LE strengthening and endurance training on Nustep at level 5 x 3 min + 3 min with intermittent rest breaks to assess HR and Sp02 and verbal cues to push through R heel and activation of R triceps  See FIM for current functional status  Therapy/Group: Individual Therapy  Edman [facility] 01/10/2012, 12:09 PM

## 2012-01-10 NOTE — Progress Notes (Signed)
Occupational Therapy Weekly Progress Note and Daily Session Note  Patient Details  Name: Courtney Faulkner MRN: [id number] Date of Birth: [birth year]  Today's Date: 01/10/2012 Time: 0930-1026 Time Calculation (min): 56 min  Patient has met 2 of 4 short term goals.  Pt is making slow progress towards goals.  Pt's progress is hindered by her decreased memory with difficulty remembering new things.  Pt's progress towards LB bathing and dressing is inhibited by endurance and decreased O2 sats when bending forward.  Pt verbalizes that she is ready to go home, but when presented with tasks and discussion over the increased time and effort it takes to complete she voices understanding of necessity of therapy at this time and place.  Patient continues to demonstrate the following deficits: impaired problem solving, impaired memory, decreased oxygen sats and increased HR with activity, decreased activity tolerance and endurance, decreased safety with transfers and therefore will continue to benefit from skilled OT intervention to enhance overall performance with BADL, iADL and Reduce care partner burden.  Patient not progressing toward long term goals.  See goal revision. Downgraded bathing, dressing, and toilet transfer goals from Modified Independent to Supervision. .Continue plan of care.  OT Short Term Goals Week 1:  OT Short Term Goal 1 (Week 1): Pt. will be supervision with UB bath OT Short Term Goal 1 - Progress (Week 1): Met OT Short Term Goal 2 (Week 1): Pt. will be min assist with LB bathing OT Short Term Goal 2 - Progress (Week 1): Progressing toward goal OT Short Term Goal 3 (Week 1): Pt. will go from sit to stand with min. assist OT Short Term Goal 3 - Progress (Week 1): Met OT Short Term Goal 4 (Week 1): Pt. will transfer to toilet with min assist OT Short Term Goal 4 - Progress (Week 1): Progressing toward goal Week 2:  OT Short Term Goal 1 (Week 2): Pt. will be min assist with LB bathing    OT Short Term Goal 2 (Week 2): Pt. will transfer to toilet with min assist  OT Short Term Goal 3 (Week 2): Pt will complete grooming in standing with supervision/cues. OT Short Term Goal 4 (Week 2): Pt will complete simple meal prep with min assist at sit to stand level.  Skilled Therapeutic Interventions/Progress Updates:    Pt seen for ADL retraining at sink from w/c level.  Pt refused shower.  Pt reports "feeling not right" this am but unable to state what is different.  On 1L O2 pt 99% sat and HR 86 sitting in bed.  Pt requested to remove O2 during ADL, frequent checks of O2 with it dropping to 91% with LB bathing with leaning forward.  With demonstration cues and verbal cues pt able to follow directions for deep breathing to raise O2 sats.  Pt 94% on room air at rest.  Pt required cues for LB dressing with cues to bring Rt leg up to knee to attempt donning pants, pt with limited ROM to bring leg up and required physical assist at this time.  Pt reports fatigue and requested to return to bed.    Therapy Documentation Precautions:  Precautions Precautions: Fall Precaution Comments: maintatin SpO2 > 90% with trials of room air.  Monitor vitals Restrictions Weight Bearing Restrictions: No General:   Vital Signs: Therapy Vitals Pulse Rate: 89  BP: 108/70 mmHg Pain: Pain Assessment Pain Assessment: No/denies pain Pain Score: 0-No pain ADL: ADL Grooming: Setup Where Assessed-Grooming: Sitting at sink Upper Body  Bathing: Setup Where Assessed-Upper Body Bathing: Sitting at sink Lower Body Bathing: Moderate assistance Where Assessed-Lower Body Bathing: Sitting at sink;Standing at sink Upper Body Dressing: Minimal assistance Where Assessed-Upper Body Dressing: Sitting at sink Lower Body Dressing: Moderate assistance Where Assessed-Lower Body Dressing: Sitting at sink;Standing at sink Toileting: Moderate assistance Where Assessed-Toileting: Teacher, adult education: Moderate  assistance Toilet Transfer Method: Stand pivot Toilet Transfer Equipment: Bedside commode (BSC over toilet) Tub/Shower Transfer: Not assessed Film/video editor: Not assessed ADL Comments: Pt requires increased time and questioning cues for sequencing and problem solving with bathing and dressing.  Pt tends to get SOB with bending forward for LB bathing and dressing and occasionally in standing.  Pt requires cues for deep breathing techniques.  See FIM for current functional status  Therapy/Group: Individual Therapy  Leonette Monarch 01/10/2012, 12:00 PM

## 2012-01-10 NOTE — Plan of Care (Signed)
Problem: RH Bathing Goal: LTG Patient will bathe with assist, cues/equipment (OT) LTG: Patient will bathe specified number of body parts with assist with/without cues using equipment (position) (OT)  Downgraded 01/10/12  Problem: RH Dressing Goal: LTG Patient will perform lower body dressing w/assist (OT) LTG: Patient will perform lower body dressing with assist, with/without cues in positioning using equipment (OT)  Downgraded 01/10/12  Problem: RH Toileting Goal: LTG Patient will perform toileting w/assist, cues/equip (OT) LTG: Patient will perform toiletiing (clothes management/hygiene) with assist, with/without cues using equipment (OT)  Downgraded 01/10/12

## 2012-01-10 NOTE — Progress Notes (Signed)
Speech Language Pathology Daily Session Note  Patient Details  Name: Courtney Faulkner MRN: [id number] Date of Birth: April 27, 1951  Today's Date: 01/10/2012 Time: 0815-0900 Time Calculation (min): 45 min  Short Term Goals: Week 2: SLP Short Term Goal 1 (Week 2): Patient will use strategies to increase accuracy during description and explanations of complex information with min verbal cueing. SLP Short Term Goal 2 (Week 2): Patient will increase intellectual awareness by stating details of diagnosis and medical interventions with mod verbal cues. SLP Short Term Goal 3 (Week 2): Patient will initiate and self correct paraphasias in conversation with min verbal question cues. SLP Short Term Goal 4 (Week 2): Patient will utilize memory compensatory strategies/aids with min verbal cues.   Skilled Therapeutic Interventions: Session focused on skilled treatment of speech-language and cognition with SLP facilitating session with moderate assist semantic cues to organize and sequence sitting up to edge of bed and donning pants as well as moderate assist tactile cues to maintain sitting balance edge of bed during weight shifting.  Following activity SpO2 reading was 88% and patient required max assist cues to perform diaphragmatic breathing in through her nose with 1/2 L O2 per nasal cannula.  Patient consumed regular textures and thin liquids with set up assist and no overt s/s of aspiration and moderate assist semantic cues to self monitor and correct naming breakfast items.  Patient with report of being SOB after breakfast; however; SpO2 readings 96-99% but SLP observed heart rate fluctuating and RN was called to room to further assess.       Daily Session Precautions/Restrictions  Precautions Precautions: Fall Precaution Comments: maintatin SpO2 > 90% with trials of room air.  Monitor vitals FIM:  Comprehension Comprehension Mode: Auditory Comprehension: 5-Understands basic 90% of the time/requires  cueing < 10% of the time Expression Expression Mode: Verbal Expression: 4-Expresses basic 75 - 89% of the time/requires cueing 10 - 24% of the time. Needs helper to occlude trach/needs to repeat words. Social Interaction Social Interaction: 5-Interacts appropriately 90% of the time - Needs monitoring or encouragement for participation or interaction. Problem Solving Problem Solving: 3-Solves basic 50 - 74% of the time/requires cueing 25 - 49% of the time Memory Memory: 4-Recognizes or recalls 75 - 89% of the time/requires cueing 10 - 24% of the time FIM - Eating Eating Activity: 5: Set-up assist for open containers General    Pain Pain Assessment Pain Assessment: No/denies pain Pain Score: 0-No pain  Therapy/Group: Individual Therapy  Charlane Ferretti., CCC-SLP [phone]  Belynda Pagaduan 01/10/2012, 1:32 PM

## 2012-01-10 NOTE — Progress Notes (Signed)
ANTICOAGULATION CONSULT NOTE - Follow Up Consult  Pharmacy Consult:  Coumadin Indication: Atrial fibrillation  No Known Allergies  Patient Measurements: Height: 5\' 3"  (160 cm) Weight: 190 lb 11.2 oz (86.5 kg) IBW/kg (Calculated) : 52.4   Vital Signs: Temp: 98.6 F (37 C) (05/17 0425) Temp src: Oral (05/17 0425) BP: 108/70 mmHg (05/17 0858) Pulse Rate: 89  (05/17 0858)  Labs:  Basename 01/10/12 1015 01/10/12 0538 01/08/12 0450 01/07/12 1310  HGB 9.7* -- -- --  HCT 30.7* -- -- --  PLT 481* -- -- --  APTT -- -- -- --  LABPROT -- 23.3* 29.3* --  INR -- 2.03* 2.72* --  HEPARINUNFRC -- -- -- --  CREATININE -- -- -- 0.65  CKTOTAL -- -- -- --  CKMB -- -- -- --  TROPONINI -- -- -- --    Estimated Creatinine Clearance: 78.9 ml/min (by C-G formula based on Cr of 0.65).  Assessment: 8 YOF with Afib s/p cardiac surgery for atrial myxoma and to continue on Coumadin therapy.  INR therapeutic today but definite trend downward - will consider mix of 2.5mg  and 5 mg doses. No bleeding documented. Hgb up to 9.7.  Goal of Therapy:  INR 2 - 3  Plan:  - Change coumadin to 2.5mg  daily except for 5mg  on Monday and Friday - Continue PT/INR M/W/F    Christoper Fabian, PharmD, BCPS Clinical pharmacist, pager [phone] 01/10/2012   10:43 AM

## 2012-01-11 DIAGNOSIS — Z5189 Encounter for other specified aftercare: Secondary | ICD-10-CM

## 2012-01-11 DIAGNOSIS — G8110 Spastic hemiplegia affecting unspecified side: Secondary | ICD-10-CM

## 2012-01-11 DIAGNOSIS — I6330 Cerebral infarction due to thrombosis of unspecified cerebral artery: Secondary | ICD-10-CM

## 2012-01-11 NOTE — Progress Notes (Signed)
Patient ID: Courtney Faulkner, female   DOB: 1950-11-27, 61 y.o.   MRN: [id number] Patient ID: Courtney Faulkner, female   DOB: 01-Oct-1950, 61 y.o.   MRN: [id number]  Subjective/Complaints: No new complaints. Had questions about the recovery in her right arm. Review of Systems  Gastrointestinal: Positive for constipation.  Genitourinary: Positive for urgency.   Objective: Vital Signs: Blood pressure 102/68, pulse 78, temperature 98 F (36.7 C), temperature source Oral, resp. rate 18, height 5\' 3"  (1.6 m), weight 86.1 kg (189 lb 13.1 oz), SpO2 98.00%. No results found. Results for orders placed during the hospital encounter of 01/02/12 (from the past 72 hour(s))  GLUCOSE, CAPILLARY     Status: Abnormal   Collection Time   01/08/12 11:14 AM      Component Value Range Comment   Glucose-Capillary 59 (*) 70 - 99 (mg/dL)   GLUCOSE, CAPILLARY     Status: Abnormal   Collection Time   01/08/12 11:45 AM      Component Value Range Comment   Glucose-Capillary 68 (*) 70 - 99 (mg/dL)    Comment 1 Notify RN     GLUCOSE, CAPILLARY     Status: Normal   Collection Time   01/08/12 12:25 PM      Component Value Range Comment   Glucose-Capillary 79  70 - 99 (mg/dL)   GLUCOSE, CAPILLARY     Status: Normal   Collection Time   01/08/12  1:18 PM      Component Value Range Comment   Glucose-Capillary 75  70 - 99 (mg/dL)   GLUCOSE, CAPILLARY     Status: Normal   Collection Time   01/08/12  4:45 PM      Component Value Range Comment   Glucose-Capillary 88  70 - 99 (mg/dL)   GLUCOSE, CAPILLARY     Status: Normal   Collection Time   01/08/12  9:04 PM      Component Value Range Comment   Glucose-Capillary 83  70 - 99 (mg/dL)   GLUCOSE, CAPILLARY     Status: Normal   Collection Time   01/09/12  7:19 AM      Component Value Range Comment   Glucose-Capillary 86  70 - 99 (mg/dL)    Comment 1 Notify RN     GLUCOSE, CAPILLARY     Status: Normal   Collection Time   01/09/12 11:39 AM      Component Value Range  Comment   Glucose-Capillary 87  70 - 99 (mg/dL)    Comment 1 Notify RN     GLUCOSE, CAPILLARY     Status: Normal   Collection Time   01/09/12  4:27 PM      Component Value Range Comment   Glucose-Capillary 97  70 - 99 (mg/dL)   GLUCOSE, CAPILLARY     Status: Normal   Collection Time   01/09/12  8:53 PM      Component Value Range Comment   Glucose-Capillary 99  70 - 99 (mg/dL)   PROTIME-INR     Status: Abnormal   Collection Time   01/10/12  5:38 AM      Component Value Range Comment   Prothrombin Time 23.3 (*) 11.6 - 15.2 (seconds)    INR 2.03 (*) 0.00 - 1.49    GLUCOSE, CAPILLARY     Status: Normal   Collection Time   01/10/12  8:08 AM      Component Value Range Comment   Glucose-Capillary 90  70 - 99 (  mg/dL)    Comment 1 Notify RN     CBC     Status: Abnormal   Collection Time   01/10/12 10:15 AM      Component Value Range Comment   WBC 8.4  4.0 - 10.5 (K/uL)    RBC 3.28 (*) 3.87 - 5.11 (MIL/uL)    Hemoglobin 9.7 (*) 12.0 - 15.0 (g/dL)    HCT 16.1 (*) 09.6 - 46.0 (%)    MCV 93.6  78.0 - 100.0 (fL)    MCH 29.6  26.0 - 34.0 (pg)    MCHC 31.6  30.0 - 36.0 (g/dL)    RDW 04.5  40.9 - 81.1 (%)    Platelets 481 (*) 150 - 400 (K/uL)   GLUCOSE, CAPILLARY     Status: Normal   Collection Time   01/10/12 12:06 PM      Component Value Range Comment   Glucose-Capillary 87  70 - 99 (mg/dL)    Comment 1 Notify RN     GLUCOSE, CAPILLARY     Status: Normal   Collection Time   01/10/12  4:32 PM      Component Value Range Comment   Glucose-Capillary 99  70 - 99 (mg/dL)   GLUCOSE, CAPILLARY     Status: Normal   Collection Time   01/10/12  9:11 PM      Component Value Range Comment   Glucose-Capillary 97  70 - 99 (mg/dL)      HEENT: normal Cardio: RRR Resp: CTA B/L GI: BS positive Extremity:  No Edema Skin:   Intact Neuro: Flat, Abnormal Motor 2-/5 in R delt,bi,tri,grip,3/5 R HF,KE,Ankle DF, 5/5 on L side and Abnormal FMC Ataxic/ dec FMC Musc/Skel:  Normal Strength showing  improvement in RUE.  Assessment/Plan: 1. Functional deficits secondary to L ICA infarct which require 3+ hours per day of interdisciplinary therapy in a comprehensive inpatient rehab setting. Physiatrist is providing close team supervision and 24 hour management of active medical problems listed below. Physiatrist and rehab team continue to assess barriers to discharge/monitor patient progress toward functional and medical goals.  Discussed recovery patterns with the patient today. FIM: FIM - Bathing Bathing Steps Patient Completed: Chest;Right Arm;Left Arm;Abdomen;Front perineal area;Right upper leg;Left upper leg Bathing: 3: Mod-Patient completes 5-7 73f 10 parts or 50-74%  FIM - Upper Body Dressing/Undressing Upper body dressing/undressing steps patient completed: Thread/unthread right bra strap;Thread/unthread left bra strap;Thread/unthread right sleeve of pullover shirt/dresss;Thread/unthread left sleeve of pullover shirt/dress;Put head through opening of pull over shirt/dress;Pull shirt over trunk Upper body dressing/undressing: 4: Min-Patient completed 75 plus % of tasks FIM - Lower Body Dressing/Undressing Lower body dressing/undressing steps patient completed: Thread/unthread left pants leg;Pull pants up/down Lower body dressing/undressing: 3: Mod-Patient completed 50-74% of tasks  FIM - Toileting Toileting steps completed by patient: Performs perineal hygiene Toileting Assistive Devices: Grab bar or rail for support Toileting: 2: Max-Patient completed 1 of 3 steps  FIM - Diplomatic Services operational officer Devices: Elevated toilet seat;Walker Toilet Transfers: 4-To toilet/BSC: Min A (steadying Pt. > 75%);4-From toilet/BSC: Min A (steadying Pt. > 75%)  FIM - Bed/Chair Transfer Bed/Chair Transfer Assistive Devices: Therapist, occupational: 5: Supine > Sit: Supervision (verbal cues/safety issues);5: Sit > Supine: Supervision (verbal cues/safety issues);4: Bed > Chair or  W/C: Min A (steadying Pt. > 75%);4: Chair or W/C > Bed: Min A (steadying Pt. > 75%)  FIM - Locomotion: Wheelchair Distance: 75 Locomotion: Wheelchair: 1: Total Assistance/staff pushes wheelchair (Pt<25%) FIM - Locomotion: Ambulation Locomotion: Ambulation Assistive  Devices: Designer, industrial/product Ambulation/Gait Assistance: 4: Min assist Locomotion: Ambulation: 1: Travels less than 50 ft with minimal assistance (Pt.>75%)  Comprehension Comprehension Mode: Auditory Comprehension: 5-Understands complex 90% of the time/Cues < 10% of the time  Expression Expression Mode: Verbal Expression: 4-Expresses basic 75 - 89% of the time/requires cueing 10 - 24% of the time. Needs helper to occlude trach/needs to repeat words.  Social Interaction Social Interaction Mode: Not assessed Social Interaction: 5-Interacts appropriately 90% of the time - Needs monitoring or encouragement for participation or interaction.  Problem Solving Problem Solving: 3-Solves basic 50 - 74% of the time/requires cueing 25 - 49% of the time  Memory Memory: 4-Recognizes or recalls 75 - 89% of the time/requires cueing 10 - 24% of the time  2. Anticoagulation/DVT prophylaxis with Pharmaceutical: Coumadin 3. Pain Management: acetominophen Medical Problem List and Plan:  1. 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 Myxoma. 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 DVT Prophylaxis/Anticoagulation: Coumadin per pharmacy. 4. Paroxysmal atrial fibrillation. Amiodarone 400 mg twice a day, Lopressor reduce to 25 mg twice a day. Monitor with increased mobility, some low BPs will reduce lopressor 5. CHF. Lasix 40 mg twice a day. Monitor for any signs of fluid overload. Daily weights , wean O2 as possible, still requiring nocturnal O2 recheck CXR if unable to wean further- no patient complaints 6. Constipation will adjust laxative medications 7.   Hypoglycemia  By hx, recent  CBGs ok, MRI of pancreas without sign of tumor LOS (Days) 9 A FACE TO FACE EVALUATION WAS PERFORMED  Estiven Kohan T 01/11/2012, 8:26 AM

## 2012-01-11 NOTE — Progress Notes (Signed)
Occupational Therapy Session Note  Patient Details  Name: MALANI LEES MRN: [id number] Date of Birth: 07/17/1951  Today's Date: 01/11/2012 Time: 1400-1500 Time Calculation (min): 60 min  Skilled Therapeutic Interventions/Progress Updates: Therapeutic Activity Group to address right UE neuro re-education and use.  Required reminders to use right hand/UE during game of dominoes to place and manipulate the dominoes     Therapy Documentation  Pain:none voice  See FIM for current functional status  Therapy/Group: Group Therapy  Rozelle Logan 01/11/2012, 5:03 PM

## 2012-01-11 NOTE — Progress Notes (Signed)
Occupational Therapy Session Note  Patient Details  Name: Courtney Faulkner MRN: [id number] Date of Birth: 03/14/1951  Today's Date: 01/11/2012 Time:  - 1100-1220  (80 min) Pain:  None  Short Term Goals: Week 2:  OT Short Term Goal 1 (Week 2): Pt. will be min assist with LB bathing  OT Short Term Goal 2 (Week 2): Pt. will transfer to toilet with min assist  OT Short Term Goal 3 (Week 2): Pt will complete grooming in standing with supervision/cues. OT Short Term Goal 4 (Week 2): Pt will complete simple meal prep with min assist at sit to stand level.  Skilled Therapeutic Interventions/Progress Updates:    Pt.'s 3 children present during bathing and dressing session.  Provided education on bathing/dressing strategies, sit to stand, RUE neuromuscular education, transfers.  Daughter [name] practiced bed to wc transfer, and wc to recliner transfer.  She needed minimal verbal prompting to do and can benefit from more training.  [name]  will be the primary caregiver when pt goes home.  Educated on using RUE in functional tasks, ie eating a sandwich, etc  Pt had difficulty reaching feet for donning and doffing footies especially on right foot.  Gave written execises for pt to practice with family.  Pt. Stood at sink with min assist for 1 minute during peri care and became nauseated.  ? Hypotension.  Event lasted about 5 min with pt feeling nauseated but only spit up salvia.  Pt. Unable to express or describe the episode with difficulty with word finding.    Therapy Documentation Precautions:  Precautions Precautions: Fall Precaution Comments: maintatin SpO2 > 90% with trials of room air.  Monitor vitals Restrictions Weight Bearing Restrictions: No   ADL: ADL Grooming: Setup Where Assessed-Grooming: Sitting at sink Upper Body Bathing: Setup Where Assessed-Upper Body Bathing: Sitting at sink Lower Body Bathing: Moderate assistance Where Assessed-Lower Body Bathing: Sitting at sink;Standing at  sink Upper Body Dressing: Minimal assistance Where Assessed-Upper Body Dressing: Sitting at sink Lower Body Dressing: Moderate assistance Where Assessed-Lower Body Dressing: Sitting at sink;Standing at sink Toileting: Moderate assistance Where Assessed-Toileting: Teacher, adult education: Moderate assistance Toilet Transfer Method: Stand pivot Toilet Transfer Equipment: Bedside commode (BSC over toilet) Tub/Shower Transfer: Not assessed Film/video editor: Not assessed ADL Comments: Pt requires increased time and questioning cues for sequencing and problem solving with bathing and dressing.  Pt tends to get SOB with bending forward for LB bathing and dressing and occasionally in standing.  Pt requires cues for deep breathing techniques.    See FIM for current functional status  Therapy/Group: Individual Therapy  Humberto Seals 01/11/2012, 11:22 AM

## 2012-01-12 NOTE — Progress Notes (Signed)
Physical Therapy Session Note  Patient Details  Name: Courtney Faulkner MRN: [id number] Date of Birth: 1951-03-23  Today's Date: 01/12/2012 Time:  -  9:40-10:25 ( )    Short Term Goals: Week 1:  PT Short Term Goal 1 (Week 1): Patient will perform bed mobility flat bed with supervision to R and L PT Short Term Goal 1 - Progress (Week 1): Partly met PT Short Term Goal 2 (Week 1): Patient will perform bed <> w/c transfers with min A to R and L PT Short Term Goal 2 - Progress (Week 1): Partly met PT Short Term Goal 3 (Week 1): Patient will perform w/c mobility on unit with bilat UE propulsion with min A x 150' PT Short Term Goal 3 - Progress (Week 1): Partly met PT Short Term Goal 4 (Week 1): Patient will perform gait training on unit with LRAD x 50' with min A PT Short Term Goal 4 - Progress (Week 1): Partly met PT Short Term Goal 5 (Week 1): Patient will perform up and down 3 stairs with one rail and mod A PT Short Term Goal 5 - Progress (Week 1): Partly met  Skilled Therapeutic Interventions/Progress Updates: Tx focused on incorporating RUE in functional mobility and WC mobility. Transfer training, pre-gait training in // bars, and gait with RW.  Pt propelled WC x100' with Mod A for steering as she continues to have difficulty consistently using RUE for pushing. Constant verbal cues and occasionally tactile cues provided to RUE.   Sit<>stand transfers in // bars with min-guard A and bil UEs. Pre-gait training  including mini-squats, marching, bil step taps and bil step ups to 2" step with min A and cues for R knee control and upright posture. Pt very limited by fatigue today, needing rest break between every set of 10 reps.  Gait training 1x60' and 1x35' with min A and RW. Pt with good weight-shift, and verbal cues needed for R foot clearance and wide step width, especially with fatigue. R knee began to buckle as fatigue increased.  Nustep for UE/LE strengthening and increased timing of  muscle activation x37min, level 5.     Therapy Documentation Precautions:  Precautions Precautions: Fall Precaution Comments: maintatin SpO2 > 90% with trials of room air.  Monitor vitals Restrictions Weight Bearing Restrictions: No   Pain: Pain Assessment Pain Assessment: 0-10 Pain Score:   2   Locomotion : Wheelchair Mobility Distance: 100  With Mod A       See FIM for current functional status  Therapy/Group: Individual Therapy  Virl Cagey 01/12/2012, 10:06 AM

## 2012-01-12 NOTE — Progress Notes (Signed)
Subjective/Complaints: No new complaints. Sleeping soundly this am Review of Systems  Gastrointestinal: Positive for constipation.  Genitourinary: Positive for urgency.   Objective: Vital Signs: Blood pressure 106/69, pulse 97, temperature 99 F (37.2 C), temperature source Oral, resp. rate 17, height 5\' 3"  (1.6 m), weight 85.9 kg (189 lb 6 oz), SpO2 88.00%. No results found. Results for orders placed during the hospital encounter of 01/02/12 (from the past 72 hour(s))  GLUCOSE, CAPILLARY     Status: Normal   Collection Time   01/09/12 11:39 AM      Component Value Range Comment   Glucose-Capillary 87  70 - 99 (mg/dL)    Comment 1 Notify RN     GLUCOSE, CAPILLARY     Status: Normal   Collection Time   01/09/12  4:27 PM      Component Value Range Comment   Glucose-Capillary 97  70 - 99 (mg/dL)   GLUCOSE, CAPILLARY     Status: Normal   Collection Time   01/09/12  8:53 PM      Component Value Range Comment   Glucose-Capillary 99  70 - 99 (mg/dL)   PROTIME-INR     Status: Abnormal   Collection Time   01/10/12  5:38 AM      Component Value Range Comment   Prothrombin Time 23.3 (*) 11.6 - 15.2 (seconds)    INR 2.03 (*) 0.00 - 1.49    GLUCOSE, CAPILLARY     Status: Normal   Collection Time   01/10/12  8:08 AM      Component Value Range Comment   Glucose-Capillary 90  70 - 99 (mg/dL)    Comment 1 Notify RN     CBC     Status: Abnormal   Collection Time   01/10/12 10:15 AM      Component Value Range Comment   WBC 8.4  4.0 - 10.5 (K/uL)    RBC 3.28 (*) 3.87 - 5.11 (MIL/uL)    Hemoglobin 9.7 (*) 12.0 - 15.0 (g/dL)    HCT 40.9 (*) 81.1 - 46.0 (%)    MCV 93.6  78.0 - 100.0 (fL)    MCH 29.6  26.0 - 34.0 (pg)    MCHC 31.6  30.0 - 36.0 (g/dL)    RDW 91.4  78.2 - 95.6 (%)    Platelets 481 (*) 150 - 400 (K/uL)   GLUCOSE, CAPILLARY     Status: Normal   Collection Time   01/10/12 12:06 PM      Component Value Range Comment   Glucose-Capillary 87  70 - 99 (mg/dL)    Comment 1 Notify  RN     GLUCOSE, CAPILLARY     Status: Normal   Collection Time   01/10/12  4:32 PM      Component Value Range Comment   Glucose-Capillary 99  70 - 99 (mg/dL)   GLUCOSE, CAPILLARY     Status: Normal   Collection Time   01/10/12  9:11 PM      Component Value Range Comment   Glucose-Capillary 97  70 - 99 (mg/dL)      HEENT: normal Cardio: RRR Resp: CTA B/L GI: BS positive Extremity:  No Edema Skin:   Intact Neuro: Flat, Abnormal Motor 2-/5 in R delt,bi,tri,grip,3/5 R HF,KE,Ankle DF, 5/5 on L side and Abnormal FMC Ataxic/ dec FMC Musc/Skel:  Normal Strength showing improvement in RUE. Just waking up this am  Assessment/Plan: 1. Functional deficits secondary to L ICA infarct which require 3+ hours  per day of interdisciplinary therapy in a comprehensive inpatient rehab setting. Physiatrist is providing close team supervision and 24 hour management of active medical problems listed below. Physiatrist and rehab team continue to assess barriers to discharge/monitor patient progress toward functional and medical goals.   FIM: FIM - Bathing Bathing Steps Patient Completed: Chest;Right Arm;Left Arm;Abdomen;Front perineal area;Right upper leg;Left upper leg Bathing: 4: Min-Patient completes 8-9 45f 10 parts or 75+ percent  FIM - Upper Body Dressing/Undressing Upper body dressing/undressing steps patient completed: Thread/unthread right bra strap;Thread/unthread left bra strap;Thread/unthread right sleeve of pullover shirt/dresss;Thread/unthread left sleeve of pullover shirt/dress;Put head through opening of pull over shirt/dress;Pull shirt over trunk Upper body dressing/undressing: 4: Min-Patient completed 75 plus % of tasks FIM - Lower Body Dressing/Undressing Lower body dressing/undressing steps patient completed: Thread/unthread left underwear leg;Pull underwear up/down;Thread/unthread left pants leg;Pull pants up/down Lower body dressing/undressing: 3: Mod-Patient completed 50-74% of  tasks  FIM - Toileting Toileting steps completed by patient: Performs perineal hygiene Toileting Assistive Devices: Grab bar or rail for support Toileting: 2: Max-Patient completed 1 of 3 steps  FIM - Diplomatic Services operational officer Devices: Elevated toilet seat;Walker Toilet Transfers: 4-To toilet/BSC: Min A (steadying Pt. > 75%);4-From toilet/BSC: Min A (steadying Pt. > 75%)  FIM - Bed/Chair Transfer Bed/Chair Transfer Assistive Devices: Therapist, occupational: 5: Supine > Sit: Supervision (verbal cues/safety issues);5: Sit > Supine: Supervision (verbal cues/safety issues);4: Bed > Chair or W/C: Min A (steadying Pt. > 75%);4: Chair or W/C > Bed: Min A (steadying Pt. > 75%)  FIM - Locomotion: Wheelchair Distance: 75 Locomotion: Wheelchair: 1: Total Assistance/staff pushes wheelchair (Pt<25%) FIM - Locomotion: Ambulation Locomotion: Ambulation Assistive Devices: Designer, industrial/product Ambulation/Gait Assistance: 4: Min assist Locomotion: Ambulation: 1: Travels less than 50 ft with minimal assistance (Pt.>75%)  Comprehension Comprehension Mode: Auditory Comprehension: 5-Understands complex 90% of the time/Cues < 10% of the time  Expression Expression Mode: Verbal Expression: 4-Expresses basic 75 - 89% of the time/requires cueing 10 - 24% of the time. Needs helper to occlude trach/needs to repeat words.  Social Interaction Social Interaction Mode: Not assessed Social Interaction: 5-Interacts appropriately 90% of the time - Needs monitoring or encouragement for participation or interaction.  Problem Solving Problem Solving: 3-Solves basic 50 - 74% of the time/requires cueing 25 - 49% of the time  Memory Memory: 4-Recognizes or recalls 75 - 89% of the time/requires cueing 10 - 24% of the time  2. Anticoagulation/DVT prophylaxis with Pharmaceutical: Coumadin 3. Pain Management: acetominophen Medical Problem List and Plan:  1. Left middle cerebral artery territory  infarct/left carotid occlusion  2. Myxoma. 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 DVT Prophylaxis/Anticoagulation: Coumadin per pharmacy. 4. Paroxysmal atrial fibrillation. Amiodarone 400 mg twice a day, Lopressor reduce to 25 mg twice a day. Monitor with increased mobility, some low BPs -reduced lopressor 5. CHF. Lasix 40 mg twice a day. Monitor for any signs of fluid overload. Daily weights , wean O2 as possible, still requiring nocturnal O2 recheck CXR if unable to wean further- no patient complaints 6. Constipation will adjust laxative medications 7.  Hypoglycemia  By hx, recent  CBGs ok, MRI of pancreas without sign of tumor LOS (Days) 10 A FACE TO FACE EVALUATION WAS PERFORMED  Courtney Faulkner 01/12/2012, 8:32 AM

## 2012-01-13 LAB — PROTIME-INR: INR: 1.84 — ABNORMAL HIGH (ref 0.00–1.49)

## 2012-01-13 NOTE — Progress Notes (Signed)
Social Work Patient ID: Courtney Faulkner, female   DOB: 1951-05-30, 61 y.o.   MRN: [id number] Met with husband who was here to attend PT therapy.  Discussed with him the need for daughter and mother in-law To come in and attend pt's therapies, to learn her care prior to discharge on Friday.  He wants to begin with home health therapies and Transition to OP Rehab.  Will discuss with team and pt.  Pt may need to build up her endurance before going to OP therapies. Will obtain DME list and order what pt needs, husband agreeable to this.  Discussed the importance of family education so all comfortable With pt's care when home.  Husband aware and will have daughter come in.

## 2012-01-13 NOTE — Progress Notes (Signed)
Patient ID: Courtney Faulkner, female   DOB: June 09, 1951, 61 y.o.   MRN: [id number]  Subjective/Complaints: No new complaints. Sleeping soundly this am Review of Systems  Gastrointestinal: Positive for constipation.  Genitourinary: Positive for urgency.   Objective: Vital Signs: Blood pressure 114/72, pulse 95, temperature 98.3 F (36.8 C), temperature source Oral, resp. rate 18, height 5\' 3"  (1.6 m), weight 85.9 kg (189 lb 6 oz), SpO2 92.00%. No results found. Results for orders placed during the hospital encounter of 01/02/12 (from the past 72 hour(s))  GLUCOSE, CAPILLARY     Status: Normal   Collection Time   01/10/12  8:08 AM      Component Value Range Comment   Glucose-Capillary 90  70 - 99 (mg/dL)    Comment 1 Notify RN     CBC     Status: Abnormal   Collection Time   01/10/12 10:15 AM      Component Value Range Comment   WBC 8.4  4.0 - 10.5 (K/uL)    RBC 3.28 (*) 3.87 - 5.11 (MIL/uL)    Hemoglobin 9.7 (*) 12.0 - 15.0 (g/dL)    HCT 11.9 (*) 14.7 - 46.0 (%)    MCV 93.6  78.0 - 100.0 (fL)    MCH 29.6  26.0 - 34.0 (pg)    MCHC 31.6  30.0 - 36.0 (g/dL)    RDW 82.9  56.2 - 13.0 (%)    Platelets 481 (*) 150 - 400 (K/uL)   GLUCOSE, CAPILLARY     Status: Normal   Collection Time   01/10/12 12:06 PM      Component Value Range Comment   Glucose-Capillary 87  70 - 99 (mg/dL)    Comment 1 Notify RN     GLUCOSE, CAPILLARY     Status: Normal   Collection Time   01/10/12  4:32 PM      Component Value Range Comment   Glucose-Capillary 99  70 - 99 (mg/dL)   GLUCOSE, CAPILLARY     Status: Normal   Collection Time   01/10/12  9:11 PM      Component Value Range Comment   Glucose-Capillary 97  70 - 99 (mg/dL)   PROTIME-INR     Status: Abnormal   Collection Time   01/13/12  4:15 AM      Component Value Range Comment   Prothrombin Time 21.6 (*) 11.6 - 15.2 (seconds)    INR 1.84 (*) 0.00 - 1.49       HEENT: normal Cardio: RRR Resp: CTA B/L GI: BS positive Extremity:  No Edema Skin:    Intact Neuro: Flat, Abnormal Motor 2-/5 in R delt,bi,tri,grip,3/5 R HF,KE,Ankle DF, 5/5 on L side and Abnormal FMC Ataxic/ dec FMC Musc/Skel:  Normal Strength showing improvement in RUE. Just waking up this am  Assessment/Plan: 1. Functional deficits secondary to L ICA infarct which require 3+ hours per day of interdisciplinary therapy in a comprehensive inpatient rehab setting. Physiatrist is providing close team supervision and 24 hour management of active medical problems listed below. Physiatrist and rehab team continue to assess barriers to discharge/monitor patient progress toward functional and medical goals.   FIM: FIM - Bathing Bathing Steps Patient Completed: Chest;Right Arm;Left Arm;Right upper leg;Left upper leg Bathing: 3: Mod-Patient completes 5-7 58f 10 parts or 50-74%  FIM - Upper Body Dressing/Undressing Upper body dressing/undressing steps patient completed: Thread/unthread right bra strap;Thread/unthread left bra strap;Thread/unthread right sleeve of front closure shirt/dress;Thread/unthread left sleeve of front closure shirt/dress;Put head through opening  of pull over shirt/dress;Pull shirt over trunk Upper body dressing/undressing: 4: Min-Patient completed 75 plus % of tasks FIM - Lower Body Dressing/Undressing Lower body dressing/undressing steps patient completed: Thread/unthread right underwear leg;Thread/unthread left underwear leg;Thread/unthread right pants leg;Thread/unthread left pants leg Lower body dressing/undressing: 3: Mod-Patient completed 50-74% of tasks  FIM - Toileting Toileting steps completed by patient: Performs perineal hygiene Toileting Assistive Devices: Grab bar or rail for support Toileting: 2: Max-Patient completed 1 of 3 steps  FIM - Diplomatic Services operational officer Devices: Grab bars;Walker Toilet Transfers: 4-From toilet/BSC: Min A (steadying Pt. > 75%)  FIM - Banker Devices:  Walker;Arm rests Bed/Chair Transfer: 4: Bed > Chair or W/C: Min A (steadying Pt. > 75%);4: Chair or W/C > Bed: Min A (steadying Pt. > 75%)  FIM - Locomotion: Wheelchair Distance: 100 Locomotion: Wheelchair: 2: Travels 50 - 149 ft with moderate assistance (Pt: 50 - 74%) FIM - Locomotion: Ambulation Locomotion: Ambulation Assistive Devices: Designer, industrial/product Ambulation/Gait Assistance: 4: Min assist Locomotion: Ambulation: 2: Travels 50 - 149 ft with minimal assistance (Pt.>75%)  Comprehension Comprehension Mode: Auditory Comprehension: 5-Understands complex 90% of the time/Cues < 10% of the time  Expression Expression Mode: Verbal Expression: 4-Expresses basic 75 - 89% of the time/requires cueing 10 - 24% of the time. Needs helper to occlude trach/needs to repeat words.  Social Interaction Social Interaction Mode: Not assessed Social Interaction: 5-Interacts appropriately 90% of the time - Needs monitoring or encouragement for participation or interaction.  Problem Solving Problem Solving: 3-Solves basic 50 - 74% of the time/requires cueing 25 - 49% of the time  Memory Memory: 4-Recognizes or recalls 75 - 89% of the time/requires cueing 10 - 24% of the time  2. Anticoagulation/DVT prophylaxis with Pharmaceutical: Coumadin 3. Pain Management: acetominophen Medical Problem List and Plan:  1. 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 Myxoma. 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 DVT Prophylaxis/Anticoagulation: Coumadin per pharmacy. 4. Paroxysmal atrial fibrillation. Amiodarone 200 mga day, Lopressor reduce to 25 mg twice a day. Monitor with increased mobility, some low BPs -reduced lopressor 5. CHF. Lasix 40 mg twice a day. Monitor for any signs of fluid overload. Daily weights , wean O2 as possible, still requiring nocturnal O2 recheck CXR if unable to wean further- no patient complaints 6.  Constipation will adjust laxative medications 7.  Hypoglycemia  By hx, recent  CBGs ok, MRI of pancreas without sign of tumor LOS (Days) 11 A FACE TO FACE EVALUATION WAS PERFORMED  Courtney Faulkner 01/13/2012, 7:16 AM

## 2012-01-13 NOTE — Progress Notes (Signed)
ANTICOAGULATION CONSULT NOTE - Follow Up Consult  Pharmacy Consult:  Coumadin Indication: Atrial fibrillation  No Known Allergies  Patient Measurements: Height: 5\' 3"  (160 cm) Weight: 187 lb 6.3 oz (85 kg) IBW/kg (Calculated) : 52.4   Vital Signs: Temp: 98.4 F (36.9 C) (05/20 0640) Temp src: Oral (05/20 0640) BP: 113/73 mmHg (05/20 0640) Pulse Rate: 96  (05/20 0640)  Labs:  Basename 01/13/12 0415 01/10/12 1015  HGB -- 9.7*  HCT -- 30.7*  PLT -- 481*  APTT -- --  LABPROT 21.6* --  INR 1.84* --  HEPARINUNFRC -- --  CREATININE -- --  CKTOTAL -- --  CKMB -- --  TROPONINI -- --    Estimated Creatinine Clearance: 78.2 ml/min (by C-G formula based on Cr of 0.65).  Assessment: Courtney Faulkner with Afib s/p cardiac surgery for atrial myxoma and to continue on Coumadin therapy.  INR subtherapeutic today s/p downward trend. No bleeding documented. Hgb up to 9.7. Noted plans for Mon and Friday dosing of 5mg  Coumadin (increase dose will begin today).  Goal of Therapy:  INR 2 - 3  Plan:  - Continue coumadin to 2.5mg  daily except for 5mg  on Monday and Friday - Check INR in AM- goal is an upward trend, otherwise dose will need further increase.  Yamilex Borgwardt K. Allena Katz, PharmD, BCPS.  Clinical Pharmacist Pager [phone]. 01/13/2012 8:13 AM

## 2012-01-13 NOTE — Progress Notes (Signed)
Physical Therapy Session Note  Patient Details  Name: Courtney Faulkner MRN: [id number] Date of Birth: March 24, 1951  Today's Date: 01/13/2012 Time: 4098-1191 Time Calculation (min): 59 min  Short Term Goals: Week 1:  PT Short Term Goal 1 (Week 1): Patient will perform bed mobility flat bed with supervision to R and L PT Short Term Goal 1 - Progress (Week 1): Partly met PT Short Term Goal 2 (Week 1): Patient will perform bed <> w/c transfers with min A to R and L PT Short Term Goal 2 - Progress (Week 1): Partly met PT Short Term Goal 3 (Week 1): Patient will perform w/c mobility on unit with bilat UE propulsion with min A x 150' PT Short Term Goal 3 - Progress (Week 1): Partly met PT Short Term Goal 4 (Week 1): Patient will perform gait training on unit with LRAD x 50' with min A PT Short Term Goal 4 - Progress (Week 1): Partly met PT Short Term Goal 5 (Week 1): Patient will perform up and down 3 stairs with one rail and mod A PT Short Term Goal 5 - Progress (Week 1): Partly met Week 2:  PT Short Term Goal 1 (Week 2): =LTG of supervision-min A overall  Skilled Therapeutic Interventions/Progress Updates:   Husband present for family education; discussed with husband home modifications (rails for stairs, grab bars in shower) and HHPT vs. Outpatient PT   Therapy Documentation Precautions:  Precautions Precautions: Fall Precaution Comments: maintatin SpO2 > 90% with trials of room air.  Monitor vitals Restrictions Weight Bearing Restrictions: No Vital Signs: Therapy Vitals Pulse Rate: 60  Oxygen Therapy SpO2: 93 % O2 Device: None (Room air) Pulse Oximetry Type: Intermittent Pain: Pain Assessment Pain Assessment: No/denies pain Mobility:  Educated patient's husband on sequence for sit <> stand and stand pivot transfers with RW with focus on safe hand placement, foot placement and for full pivot with RW prior to sitting.  Verbalized sequence for stand pivot transfer to simulated SUV;  patient and husband gave repeat demonstration with min A for safe pivot, RW management and to assist RLE into and out of tall vehicle.  Husband described walk in shower in downstairs bathroom and asked where grab bars would need to be installed; set up simulated walk in shower with lip and shower seat; patient performed 2 reps stepping laterally into and out of shower with use of "grab bar" in front of her and to L of seat with min HHA for safety.  Husband states grab bars and removable shower head will be installed prior to D/C.   Locomotion : Ambulation Ambulation/Gait Assistance: 4: Min assist with gait training in controlled environment with RW x 150' with cues for full step and stride length RLE and foot clearance with L lateral and anterior weight shifts; as patient fatigues R foot drag increases especially with tennis shoes donned secondary to rubber sole placing patient at increased risk of falls during gait; will assess patient with orthotic tomorrow to assist with foot clearance during stairs and gait training Stairs Patient's husband states that he does not feel a rail will be necessary when entering house from garage or back entrance from porch; states that he and his daughter will assist patient up and down the stairs with HHA each time she needs to leave and enter the house.  Performed up and down 5 steps with bilat HHA (mod A for balance and anterior weight shift) and verbal cues for safe sequence and husband return verbalizing  safe sequence.  Patient very fatigued after completing stairs and noted to have caught her R toes on step when advancing RLE.  Wheelchair Mobility Distance: 150 total A; husband questioning why patient can't propel w/c herself; had patient demonstrate w/c propulsion; still requires increased assistance secondary to decreased attention to R, decreased strength and impaired timing and sequencing.  Discussed patient needing w/c for community distances for energy  conservation; patient does not want a w/c but was very fatigued following prolonged ambulation, stairs and car transfer.  Will discuss again with patient if she plans to do outpatient PT.  See FIM for current functional status  Therapy/Group: Individual Therapy  Edman Circle [facility] 01/13/2012, 4:24 PM

## 2012-01-13 NOTE — Progress Notes (Signed)
Speech Language Pathology Daily Session Note  Patient Details  Name: Courtney Faulkner MRN: [id number] Date of Birth: 1951/03/29  Today's Date: 01/13/2012 Time: 0830-0910 Time Calculation (min): 40 min  Short Term Goals: Week 2: SLP Short Term Goal 1 (Week 2): Patient will use strategies to increase accuracy during description and explanations of complex information with min verbal cueing. SLP Short Term Goal 2 (Week 2): Patient will increase intellectual awareness by stating details of diagnosis and medical interventions with mod verbal cues. SLP Short Term Goal 3 (Week 2): Patient will initiate and self correct paraphasias in conversation with min verbal question cues. SLP Short Term Goal 4 (Week 2): Patient will utilize memory compensatory strategies/aids with min verbal cues.   Skilled Therapeutic Interventions: Session focused on self monitoring and correcting expressive language (naming) errors; SLP facilitated session with discussion regarding weekend event and visitors with patient's spontaneous verbal expression typically characterized by simple 1-2 word responses and repetitions of responses.  During a structured task that required the paitent to utilize description (a previously given compensatory strategy) she required max assist to self monitor verbal expression with perseveration on description of first item on every subsequent trial.  With presentation of written stimuil while ordering meal patient required moderate assist semantic cues to self monitor and correct errors.  At end of session patient verbalized that she was nauseas, that she wanted to go back to bed and that tomorrow he would be able to do better with modified independence.   Daily Session Precautions/Restrictions    FIM:  Comprehension Comprehension Mode: Auditory Comprehension: 5-Follows basic conversation/direction: With no assist Expression Expression Mode: Verbal Expression: 4-Expresses basic 75 - 89% of  the time/requires cueing 10 - 24% of the time. Needs helper to occlude trach/needs to repeat words. Social Interaction Social Interaction: 5-Interacts appropriately 90% of the time - Needs monitoring or encouragement for participation or interaction. Problem Solving Problem Solving: 3-Solves basic 50 - 74% of the time/requires cueing 25 - 49% of the time Memory Memory: 4-Recognizes or recalls 75 - 89% of the time/requires cueing 10 - 24% of the time General    Pain Pain Assessment Pain Assessment:  (reports nausea; RN aware)  Therapy/Group: Individual Therapy  Courtney Faulkner., CCC-SLP [phone]  Courtney Faulkner 01/13/2012, 10:45 AM

## 2012-01-13 NOTE — Progress Notes (Signed)
Occupational Therapy Session Note  Patient Details  Name: Courtney Faulkner MRN: [id number] Date of Birth: [birth year]  Today's Date: 01/13/2012 Time: 1030-1125 Time Calculation (min): 55 min  Short Term Goals: Week 2:  OT Short Term Goal 1 (Week 2): Pt. will be min assist with LB bathing  OT Short Term Goal 2 (Week 2): Pt. will transfer to toilet with min assist  OT Short Term Goal 3 (Week 2): Pt will complete grooming in standing with supervision/cues. OT Short Term Goal 4 (Week 2): Pt will complete simple meal prep with min assist at sit to stand level.  Skilled Therapeutic Interventions/Progress Updates:    Pt seen for ADL retraining at sink from w/c level per pt's request.  Focus on activity tolerance, sit to stand, standing tolerance, and breathing techniques with activity.  Pt reports nausea prior to session, but feeling better at this time.  Stand pivot transfer bed > w/c min assist with cues for hand placement.  Pt demonstrated increased standing balance and tolerance with LB bathing and pulling up pants.  Pt attempted to cross Lt foot over Rt knee to don pants and socks, required backward chaining for socks and Rt pant leg.  Pt required 2-3 rest breaks during session, but O2 remained > 92% with activity.  Therapy Documentation Precautions:  Precautions Precautions: Fall Precaution Comments: maintatin SpO2 > 90% with trials of room air.  Monitor vitals Restrictions Weight Bearing Restrictions: No Pain: Pain Assessment Pain Assessment:  (reports nausea; RN aware)  See FIM for current functional status  Therapy/Group: Individual Therapy  Leonette Monarch 01/13/2012, 12:28 PM

## 2012-01-14 LAB — PROTIME-INR
INR: 1.8 — ABNORMAL HIGH (ref 0.00–1.49)
Prothrombin Time: 21.2 seconds — ABNORMAL HIGH (ref 11.6–15.2)

## 2012-01-14 MED ORDER — WARFARIN SODIUM 5 MG PO TABS
5.0000 mg | ORAL_TABLET | Freq: Once | ORAL | Status: AC
  Administered 2012-01-14: 5 mg via ORAL
  Filled 2012-01-14: qty 1

## 2012-01-14 NOTE — Plan of Care (Signed)
Problem: RH Simple Meal Prep Goal: LTG Patient will perform simple meal prep w/assist (OT) LTG: Patient will perform simple meal prep with assistance, with/without cues (OT).  Downgraded 01/14/12  Problem: RH Laundry Goal: LTG Patient will perform laundry w/assist, cues (OT) LTG: Patient will perform laundry with assistance, with/without cues (OT).  Downgraded 01/14/12

## 2012-01-14 NOTE — Progress Notes (Signed)
Patient resting quietly in bed without distress.  O2 SAT 88%.  O2 @ 2 L/M via nasal cannula applied.  O2 SAT 96%.

## 2012-01-14 NOTE — Progress Notes (Signed)
Occupational Therapy Session Note  Patient Details  Name: Courtney Faulkner MRN: [id number] Date of Birth: [birth year]  Today's Date: 01/14/2012 Time: 0915-1000 and 4098-1191 Time Calculation (min): 45 min and 42 min  Short Term Goals: Week 2:  OT Short Term Goal 1 (Week 2): Pt. will be min assist with LB bathing  OT Short Term Goal 2 (Week 2): Pt. will transfer to toilet with min assist  OT Short Term Goal 3 (Week 2): Pt will complete grooming in standing with supervision/cues. OT Short Term Goal 4 (Week 2): Pt will complete simple meal prep with min assist at sit to stand level.  Skilled Therapeutic Interventions/Progress Updates:    1) Pt seen for ADL retraining at sink from w/c level per pt's request.  Encouraged pt to complete bathing at walk-in shower level, but pt continues to refuse shower.  Pt overall supervision with bathing except requiring assist to wash buttocks.  Pt with decreased ability to cross Rt or Lt leg over knee to don socks therefore requires physical assist with beginning socks.  Encouraged use of dominant RUE with grooming, bathing, and dressing.  O2 on room air 93% and HR 98 at end of session.  2) 1:1 OT with focus on home management with simulating simple meal prep in ADL apartment and laundry.  Use of RW with ambulation in ADL kitchen to gather items necessary to cook spaghetti.  Pt required close supervision wshe iith reaching up for items in cabinet.  Pt able to sequence preparation of spaghetti with questioning cues.  Pt demonstrated laundry task from putting clothes in washer, moving to dryer, and folding clothes all in standing position.  Pt required occasional rest breaks during tasks.  O2 remained > 90% throughout this session.  Therapy Documentation Precautions:  Precautions Precautions: Fall Precaution Comments: maintatin SpO2 > 90% with trials of room air.  Monitor vitals Restrictions Weight Bearing Restrictions: No General:   Vital Signs: Therapy  Vitals BP: 118/70 mmHg Pain: Pain Assessment Pain Assessment: No/denies pain  See FIM for current functional status  Therapy/Group: Individual Therapy  Leonette Monarch 01/14/2012, 10:26 AM

## 2012-01-14 NOTE — Progress Notes (Signed)
ANTICOAGULATION CONSULT NOTE - Follow Up Consult  Pharmacy Consult:  Coumadin Indication: Atrial fibrillation  No Known Allergies  Patient Measurements: Height: 5\' 3"  (160 cm) Weight: 187 lb 13.3 oz (85.2 kg) IBW/kg (Calculated) : 52.4   Vital Signs: Temp: 98.2 F (36.8 C) (05/21 0500) Temp src: Oral (05/21 0500) BP: 118/70 mmHg (05/21 0831) Pulse Rate: 80  (05/21 0500)  Labs:  Basename 01/14/12 0550 01/13/12 0415  HGB -- --  HCT -- --  PLT -- --  APTT -- --  LABPROT 21.2* 21.6*  INR 1.80* 1.84*  HEPARINUNFRC -- --  CREATININE -- --  CKTOTAL -- --  CKMB -- --  TROPONINI -- --    Estimated Creatinine Clearance: 78.3 ml/min (by C-G formula based on Cr of 0.65).  Assessment: 28 YOF with Afib s/p cardiac surgery for atrial myxoma and to continue on Coumadin therapy.  INR subtherapeutic again today s/p downward trend. No bleeding documented. Hgb up to 9.7 as of 01/10/12.   Goal of Therapy:  INR 2 - 3  Plan:  - coumadin  5mg  x 1 dose today - change back to daily INRs until INR 2-3 Herby Abraham, Pharm.D. [phone] 01/14/2012 10:48 AM

## 2012-01-14 NOTE — Plan of Care (Signed)
Problem: RH Dressing Goal: LTG Patient will perform upper body dressing (OT) LTG Patient will perform upper body dressing with assist, with/without cues (OT).  Downgraded 01/14/12

## 2012-01-14 NOTE — Progress Notes (Signed)
Speech Language Pathology Daily Session Note  Patient Details  Name: LYNAE PEDERSON MRN: [id number] Date of Birth: 1950-10-23  Today's Date: 01/14/2012 Time: 9147-8295 Time Calculation (min): 35 min  Short Term Goals: Week 2: SLP Short Term Goal 1 (Week 2): Patient will use strategies to increase accuracy during description and explanations of complex information with min verbal cueing. SLP Short Term Goal 2 (Week 2): Patient will increase intellectual awareness by stating details of diagnosis and medical interventions with mod verbal cues. SLP Short Term Goal 3 (Week 2): Patient will initiate and self correct paraphasias in conversation with min verbal question cues. SLP Short Term Goal 4 (Week 2): Patient will utilize memory compensatory strategies/aids with min verbal cues.   Skilled Therapeutic Interventions: Session focused on language therapy; SLP facilitated session with minimal assist semantic cues to recall and verbally express daily events during moments of word finding difficulty; patient was able to recognize errors however she had difficulty utilizing strategies to correct errors.  SLP reinforced strategies such as: use of written work, description, and use of a different word.  Patient required max assist to recognize impact of physical deficits on discharge recommendations; however, is able to demonstrate anticipatory awareness with regard to language deficits and difficulty returning to work.    Daily Session Precautions/Restrictions    FIM:  Comprehension Comprehension Mode: Auditory Comprehension: 5-Understands complex 90% of the time/Cues < 10% of the time Expression Expression Mode: Verbal Expression: 4-Expresses basic 75 - 89% of the time/requires cueing 10 - 24% of the time. Needs helper to occlude trach/needs to repeat words. Social Interaction Social Interaction: 5-Interacts appropriately 90% of the time - Needs monitoring or encouragement for participation or  interaction. Problem Solving Problem Solving: 4-Solves basic 75 - 89% of the time/requires cueing 10 - 24% of the time Memory Memory: 4-Recognizes or recalls 75 - 89% of the time/requires cueing 10 - 24% of the time General  Amount of Missed SLP Time (min): 10 Minutes Missed Time Reason: Patient fatigue Pain Pain Assessment Pain Assessment: No/denies pain Pain Score: 0-No pain  Therapy/Group: Individual Therapy  Charlane Ferretti., CCC-SLP [phone]  Golden Emile 01/14/2012, 4:33 PM

## 2012-01-14 NOTE — Progress Notes (Signed)
Patient ID: Courtney Faulkner, female   DOB: Sep 09, 1950, 61 y.o.   MRN: [id number]  Subjective/Complaints: Some nausea yesterday but feels better thsi am Review of Systems  Gastrointestinal: Positive for constipation.  Genitourinary: Positive for urgency.   Objective: Vital Signs: Blood pressure 104/68, pulse 80, temperature 98.2 F (36.8 C), temperature source Oral, resp. rate 18, height 5\' 3"  (1.6 m), weight 85.2 kg (187 lb 13.3 oz), SpO2 97.00%. No results found. Results for orders placed during the hospital encounter of 01/02/12 (from the past 72 hour(s))  PROTIME-INR     Status: Abnormal   Collection Time   01/13/12  4:15 AM      Component Value Range Comment   Prothrombin Time 21.6 (*) 11.6 - 15.2 (seconds)    INR 1.84 (*) 0.00 - 1.49    PROTIME-INR     Status: Abnormal   Collection Time   01/14/12  5:50 AM      Component Value Range Comment   Prothrombin Time 21.2 (*) 11.6 - 15.2 (seconds)    INR 1.80 (*) 0.00 - 1.49       HEENT: normal Cardio: RRR Resp: CTA B/L GI: BS positive Extremity:  No Edema Skin:   Intact Neuro: Flat, Abnormal Motor 3-/5 in R delt,bi,tri,grip,3/5 R HF,KE,Ankle DF, 5/5 on L side and Abnormal FMC Ataxic/ dec FMC Musc/Skel:  Normal Strength showing improvement in RUE. Just waking up this am  Assessment/Plan: 1. Functional deficits secondary to L ICA infarct which require 3+ hours per day of interdisciplinary therapy in a comprehensive inpatient rehab setting. Physiatrist is providing close team supervision and 24 hour management of active medical problems listed below. Physiatrist and rehab team continue to assess barriers to discharge/monitor patient progress toward functional and medical goals.   FIM: FIM - Bathing Bathing Steps Patient Completed: Chest;Right Arm;Left Arm;Abdomen;Front perineal area;Right upper leg;Left upper leg;Right lower leg (including foot);Left lower leg (including foot) Bathing: 4: Min-Patient completes 8-9 33f 10 parts  or 75+ percent  FIM - Upper Body Dressing/Undressing Upper body dressing/undressing steps patient completed: Thread/unthread right bra strap;Thread/unthread left bra strap;Thread/unthread right sleeve of pullover shirt/dresss;Thread/unthread left sleeve of pullover shirt/dress;Put head through opening of pull over shirt/dress;Pull shirt over trunk Upper body dressing/undressing: 4: Min-Patient completed 75 plus % of tasks FIM - Lower Body Dressing/Undressing Lower body dressing/undressing steps patient completed: Thread/unthread right underwear leg;Thread/unthread left underwear leg;Pull underwear up/down;Thread/unthread left pants leg;Pull pants up/down Lower body dressing/undressing: 3: Mod-Patient completed 50-74% of tasks  FIM - Toileting Toileting steps completed by patient: Adjust clothing prior to toileting;Performs perineal hygiene;Adjust clothing after toileting Toileting Assistive Devices: Grab bar or rail for support Toileting: 4: Steadying assist  FIM - Diplomatic Services operational officer Devices: Grab bars;Walker Toilet Transfers: 4-From toilet/BSC: Min A (steadying Pt. > 75%)  FIM - Bed/Chair Transfer Bed/Chair Transfer Assistive Devices: Therapist, occupational: 3: Bed > Chair or W/C: Mod A (lift or lower assist);3: Chair or W/C > Bed: Mod A (lift or lower assist)  FIM - Locomotion: Wheelchair Distance: 150 Locomotion: Wheelchair: 1: Total Assistance/staff pushes wheelchair (Pt<25%) FIM - Locomotion: Ambulation Locomotion: Ambulation Assistive Devices: Designer, industrial/product Ambulation/Gait Assistance: 4: Min assist Locomotion: Ambulation: 4: Travels 150 ft or more with minimal assistance (Pt.>75%)  Comprehension Comprehension Mode: Auditory Comprehension: 5-Understands complex 90% of the time/Cues < 10% of the time  Expression Expression Mode: Verbal Expression: 4-Expresses basic 75 - 89% of the time/requires cueing 10 - 24% of the time. Needs helper to occlude  trach/needs  to repeat words.  Social Interaction Social Interaction Mode: Not assessed Social Interaction: 5-Interacts appropriately 90% of the time - Needs monitoring or encouragement for participation or interaction.  Problem Solving Problem Solving: 3-Solves basic 50 - 74% of the time/requires cueing 25 - 49% of the time  Memory Memory: 4-Recognizes or recalls 75 - 89% of the time/requires cueing 10 - 24% of the time  2. Anticoagulation/DVT prophylaxis with Pharmaceutical: Coumadin 3. Pain Management: acetominophen Medical Problem List and Plan:  1. 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 Myxoma. 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 DVT Prophylaxis/Anticoagulation: Coumadin per pharmacy. 4. Paroxysmal atrial fibrillation. Amiodarone 200 mg day, Lopressor reduce to 25 mg twice a day. Monitor with increased mobility, some low BPs -reduced lopressor 5. CHF. Lasix 40 mg twice a day. Monitor for any signs of fluid overload. Daily weights , wean O2 as possible, still requiring nocturnal O2 recheck CXR if unable to wean further- no patient complaints 6. Constipation will adjust laxative medications 7.  Hypoglycemia  By hx, recent  CBGs ok, MRI of pancreas without sign of tumor LOS (Days) 12 A FACE TO FACE EVALUATION WAS PERFORMED  Courtney Faulkner 01/14/2012, 7:24 AM

## 2012-01-14 NOTE — Progress Notes (Signed)
Physical Therapy Session Note  Patient Details  Name: [name] MRN: [id number] Date of Birth: [birth year]  Today's Date: 01/14/2012 Time: 9562-1308 Time Calculation (min): 53 min  Short Term Goals: Week 1:  PT Short Term Goal 1 (Week 1): Patient will perform bed mobility flat bed with supervision to R and L PT Short Term Goal 1 - Progress (Week 1): Partly met PT Short Term Goal 2 (Week 1): Patient will perform bed <> w/c transfers with min A to R and L PT Short Term Goal 2 - Progress (Week 1): Partly met PT Short Term Goal 3 (Week 1): Patient will perform w/c mobility on unit with bilat UE propulsion with min A x 150' PT Short Term Goal 3 - Progress (Week 1): Partly met PT Short Term Goal 4 (Week 1): Patient will perform gait training on unit with LRAD x 50' with min A PT Short Term Goal 4 - Progress (Week 1): Partly met PT Short Term Goal 5 (Week 1): Patient will perform up and down 3 stairs with one rail and mod A PT Short Term Goal 5 - Progress (Week 1): Partly met Week 2:  PT Short Term Goal 1 (Week 2): =LTG of supervision-min A overall  Skilled Therapeutic Interventions/Progress Updates:   Had long discussion with patient about HHPT vs outpatient and maximizing therapy visits by beginning with HHPT to allow her activity tolerance/endurance to improve to tolerate community mobility and therapy.  Discussed the energy requirements of entering and exiting home, outpatient clinic, car transfers, stairs and participating fully in all therapies and that patient may be too fatigued to perform her home ambulation and ADL safely.  Will continue to discuss with patient.   Therapy Documentation Precautions:  Precautions Precautions: Fall Precaution Comments: maintatin SpO2 > 90% with trials of room air.  Monitor vitals Restrictions Weight Bearing Restrictions: No Pain:  No c/o pain but increased RLE edema noted Locomotion : Patient still demonstrating R foot drop/drag; unable to don  PLS or Allard brace in shoe secondary to edema; donned external foot up brace and performed pre gait training with RLE foot taps on to 6" step from terminal hip extension position to focus on anterior pelvic rotation, hip and knee flexion with ankle DF for full step length and foot advancement and gait training with RW and min A x 25' +25' with tactile cues for L lateral weight shift and full RLE step length  Stair training with bilat UE support with min-mod A with questioning cues for sequence up and down 2 tall steps x 2 reps with manual assistance to fully advance RLE to next step; patient able to verbalize safe stepping sequence with extra time  See FIM for current functional status  Therapy/Group: Individual Therapy  Edman Circle [facility] 01/14/2012, 4:28 PM

## 2012-01-15 ENCOUNTER — Inpatient Hospital Stay (HOSPITAL_COMMUNITY): Payer: BC Managed Care – PPO

## 2012-01-15 LAB — PROTIME-INR: INR: 1.91 — ABNORMAL HIGH (ref 0.00–1.49)

## 2012-01-15 MED ORDER — WARFARIN SODIUM 5 MG PO TABS
5.0000 mg | ORAL_TABLET | Freq: Once | ORAL | Status: AC
  Administered 2012-01-15: 5 mg via ORAL
  Filled 2012-01-15: qty 1

## 2012-01-15 NOTE — Progress Notes (Signed)
TCTS BRIEF PROGRESS NOTE   Mrs Yin looks very good. She seems to be making steady progress w/ rehab. All of her incisions are healing nicely. Chest tube sutures may be removed. She no longer has any physical restrictions related to her surgery. Will check f/u CXR. I would like to see her again for follow up in 6-8 weeks following d/c. We appreciate the efforts of the PMR team.   Purcell Nails 01/15/2012 11:02 AM

## 2012-01-15 NOTE — Progress Notes (Signed)
ANTICOAGULATION CONSULT NOTE - Follow Up Consult  Pharmacy Consult:  Coumadin Indication: Atrial fibrillation  No Known Allergies  Patient Measurements: Height: 5\' 3"  (160 cm) Weight: 186 lb 11.7 oz (84.7 kg) IBW/kg (Calculated) : 52.4   Vital Signs: Temp: 98.1 F (36.7 C) (05/22 0525) Temp src: Oral (05/22 0525) BP: 107/69 mmHg (05/22 0525) Pulse Rate: 112  (05/22 0525)  Labs:  Alvira Philips 01/15/12 0540 01/14/12 0550 01/13/12 0415  HGB -- -- --  HCT -- -- --  PLT -- -- --  APTT -- -- --  LABPROT 22.2* 21.2* 21.6*  INR 1.91* 1.80* 1.84*  HEPARINUNFRC -- -- --  CREATININE -- -- --  CKTOTAL -- -- --  CKMB -- -- --  TROPONINI -- -- --    Estimated Creatinine Clearance: 78.1 ml/min (by C-G formula based on Cr of 0.65).  Assessment: 74 YOF with Afib s/p cardiac surgery for atrial myxoma and to continue on Coumadin therapy.  INR 1.91 today.  No bleeding documented. Hgb up to 9.7 as of 01/10/12.   Goal of Therapy:  INR 2 - 3  Plan:  - repeat coumadin  5mg  x 1 dose again today - continue daily INRs until INR 2-3 Herby Abraham, Pharm.D. [phone] 01/15/2012 10:03 AM

## 2012-01-15 NOTE — Patient Care Conference (Signed)
Inpatient RehabilitationTeam Conference Note Date: 01/15/2012   Time: 10:40 AM    Patient Name: Courtney Faulkner      Medical Record Number: [id number]  Date of Birth: 26-Jul-1951 Sex: Female         Room/Bed: 4144/4144-01 Payor Info: Payor: BLUE CROSS BLUE SHIELD  Plan: [facility] HE[employer] PPO  Product Type: *No Product type*     Admitting Diagnosis: L CVA  Admit Date/Time:  01/02/2012  3:17 PM Admission Comments: No comment available   Primary Diagnosis:  Stroke Principal Problem: Stroke  Patient Active Problem List  Diagnoses Date Noted  . Stroke 01/02/2012  . 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 CVA (cerebral infarction) 12/15/2011  . ICAO (internal carotid artery occlusion), left 12/15/2011  . Hypoglycemia 12/14/2011  . Anemia 12/14/2011    Expected Discharge Date: Expected Discharge Date: 01/17/12  Team Members Present: Physician: Dr. Claudette Laws Case Manager Present: Lutricia Horsfall, RN Social Worker Present: Dossie Der, LCSW Nurse Present: Gregor Hams, RN PT Present: Karolee Stamps, Lillie Columbia, PT OT Present: Bretta Bang, Verlene Mayer, OT SLP Present: Fae Pippin, SLP PPS Coordinator: Tora Duck, RN    Current Status/Progress Goal Weekly Team Focus  Medical   poor wareness of def, medically stable  family training  Identify family members to train   Bowel/Bladder   Incontinent of bowel and bladder at times, more often at night than during the day.  Uses the bathroom or BSC.  Briefs in place.  Min assist to manage bowel and bladder      Swallow/Nutrition/ Hydration             ADL's   min/steady assist bathing, min assist UB dsg, min assist LB dsg, min/steady assist toileting, min assist toilet transfer  supervision overall  activity tolerance, endurance, shower and toilet transfers, family education   Mobility   min A overall  supervision overall except min A stairs and car transfer  endurance, balance, gait/stairs, family education     Communication   min assist  min assit  increase use of compensatory strategies and family education   Safety/Cognition/ Behavioral Observations  mod assist  min assist  increase self monitoring and family education   Pain   Patient denies pain  </=2      Skin   Incision to right chest with dermabond; 2 sutures under right breast clean, dry, and intact  No new skin breakdown; incision will remain free of infection         *See Interdisciplinary Assessment and Plan and progress notes for long and short-term goals  Barriers to Discharge: see above    Possible Resolutions to Barriers:  see above    Discharge Planning/Teaching Needs:  Home with family providing care-family education this week. need family to do more hands on care      Team Discussion:  Hgb improved. BP meds adjusted, BP WNL. Ritalin effective. Increased HR with activity.  Pt using foot-up brace for R foot drop.  Pt has cognitive deficits. Poor awareness re: bowel and bladder. Urinary urgency.  Incontinent at times. Pt's husband and daughter need to come for education, have been resistant to come, stating none needed. Continue to encourage.  Revisions to Treatment Plan:  none   Continued Need for Acute Rehabilitation Level of Care: The patient requires daily medical management by a physician with specialized training in physical medicine and rehabilitation for the following conditions: Daily direction of a multidisciplinary physical rehabilitation program to ensure safe treatment while eliciting the  highest outcome that is of practical value to the patient.: Yes Daily medical management of patient stability for increased activity during participation in an intensive rehabilitation regime.: Yes Daily analysis of laboratory values and/or radiology reports with any subsequent need for medication adjustment of medical intervention for : Neurological problems  Meryl Dare 01/17/2012, 11:21 AM

## 2012-01-15 NOTE — Progress Notes (Signed)
Recreational Therapy Discharge Summary Patient Details  Name: Courtney Faulkner MRN: [id number] Date of Birth: March 11, 1951 Today's Date: 01/15/2012  Long term goals set: 1  Long term goals met: 1  Comments on progress toward goals: Pt is being discharged from TR services due to limited interest in TR tasks.  Pt requires supervision/set up assist and min cues to complete simple TR tasks.   Dwight Burdo 01/15/2012, 5:07 PM

## 2012-01-15 NOTE — Progress Notes (Signed)
Speech Language Pathology Daily Session Note  Patient Details  Name: Courtney Faulkner MRN: [id number] Date of Birth: 03-23-1951  Today's Date: 01/15/2012 Time: 1415-1440 Time Calculation (min): 25 min  Short Term Goals:  SLP Short Term Goal 1 (Week 2): Patient will use strategies to increase accuracy during description and explanations of complex information with min verbal cueing. SLP Short Term Goal 2 (Week 2): Patient will increase intellectual awareness by stating details of diagnosis and medical interventions with mod verbal cues. SLP Short Term Goal 3 (Week 2): Patient will initiate and self correct paraphasias in conversation with min verbal question cues. SLP Short Term Goal 4 (Week 2): Patient will utilize memory compensatory strategies/aids with min verbal cues.   Skilled Therapeutic Interventions: Session focused on functional communication and word finding; SLP facilitated session with moderate assist semantic cues to recall and verbally express daily events during moments of word finding difficulty; patient was able to recognize errors however she had difficulty utilizing strategies to correct errors. SLP reinforced strategies such as: use of written work, description, and use of a different word. Pt demonstrated emergent awareness by reporting that increased fatigue this afternoon is impacting word-finding difficulties and overall functional communication.   Daily Session FIM:  Comprehension Comprehension: 6-Follows complex conversation/direction: With extra time/assistive device Expression Expression: 4-Expresses basic 75 - 89% of the time/requires cueing 10 - 24% of the time. Needs helper to occlude trach/needs to repeat words. Social Interaction Social Interaction: 5-Interacts appropriately 90% of the time - Needs monitoring or encouragement for participation or interaction. Problem Solving Problem Solving: 4-Solves basic 75 - 89% of the time/requires cueing 10 - 24% of the  time Memory Memory: 4-Recognizes or recalls 75 - 89% of the time/requires cueing 10 - 24% of the time General  Amount of Missed SLP Time (min): 20 Minutes Missed Time Reason: X-ray Pain No/Denies Pain  Therapy/Group: Individual Therapy  Lillianah Swartzentruber 01/15/2012, 2:44 PM

## 2012-01-15 NOTE — Care Management Note (Signed)
Talked with pt after team conference. Pt reports feeling ready for discharge on 5/24 as planned. Called pts husband and requested that he and his daughter come for education prior to d/c. He agreed that they will come tomorrow for family ed. Discussed equipment and HH, explained that all would be ordered by the SW.

## 2012-01-15 NOTE — Progress Notes (Signed)
Occupational Therapy Session Note  Patient Details  Name: Courtney Faulkner MRN: [id number] Date of Birth: 01-14-1951  Today's Date: 01/15/2012 Time: 0815-0910 and 1130-1200 Time Calculation (min): 55 min and 30 min  Short Term Goals: Week 2:  OT Short Term Goal 1 (Week 2): Pt. will be min assist with LB bathing  OT Short Term Goal 2 (Week 2): Pt. will transfer to toilet with min assist  OT Short Term Goal 3 (Week 2): Pt will complete grooming in standing with supervision/cues. OT Short Term Goal 4 (Week 2): Pt will complete simple meal prep with min assist at sit to stand level.  Skilled Therapeutic Interventions/Progress Updates:    1) Pt seen for ADL retraining at walk-in shower level in room.  Focus on ambulation with RW, walk-in shower transfer, use of DME to increase independence and safety, and techniques for bathing and dressing to increase independence with LB.  Pt required min assist with stand pivot transfer to shower chair in walk-in shower.  Pt with increased independence with bathing and sequencing of bathing at shower level.  Pt able to bring bilat feet up to knees to wash feet.  Pt continues to require assist with donning socks, provided assist to put sock over toes with pt pulling rest of sock over foot.  Pt with increased spontaneous use of dominant RUE with all bathing, dressing, and grooming tasks.  2) 1:1 OT with focus on endurance and activity tolerance in standing.  Nustep on level 5 for 5 minutes with focus on RUE use and maintaining grasp while challenging endurance of BUE/BLE.  BUE strengthening with 2# medicine ball with bicep curls and horizontal adduction/abduction to encourage strengthening of RUE.  PNF pattern reaching in standing with therapy ball at wall to increase ROM and active use of RUE.  Therapy Documentation Precautions:  Precautions Precautions: Fall Precaution Comments: maintatin SpO2 > 90% with trials of room air.  Monitor vitals Restrictions Weight  Bearing Restrictions: No General:   Vital Signs: Therapy Vitals Temp: 98.1 F (36.7 C) Temp src: Oral Pulse Rate: 112  Resp: 20  BP: 107/69 mmHg Patient Position, if appropriate: Lying Oxygen Therapy SpO2: 78 % O2 Device: None (Room air) Pain:   Pt with no c/o pain this session.  See FIM for current functional status  Therapy/Group: Individual Therapy  Leonette Monarch 01/15/2012, 9:16 AM

## 2012-01-15 NOTE — Plan of Care (Signed)
Problem: RH SKIN INTEGRITY Goal: RH STG ABLE TO PERFORM INCISION/WOUND CARE W/ASSISTANCE STG Able To Perform Incision/Wound Care With min Assistance.  Outcome: Progressing Incisions healed to chest, sutures removed today

## 2012-01-15 NOTE — Progress Notes (Signed)
Physical Therapy Session Note  Patient Details  Name: JOSSELYNE ONOFRIO MRN: [id number] Date of Birth: 08-Jun-1951  Today's Date: 01/15/2012 Time: 1305-1407 Time Calculation (min): 62 min  Short Term Goals: Week 1:  PT Short Term Goal 1 (Week 1): Patient will perform bed mobility flat bed with supervision to R and L PT Short Term Goal 1 - Progress (Week 1): Partly met PT Short Term Goal 2 (Week 1): Patient will perform bed <> w/c transfers with min A to R and L PT Short Term Goal 2 - Progress (Week 1): Partly met PT Short Term Goal 3 (Week 1): Patient will perform w/c mobility on unit with bilat UE propulsion with min A x 150' PT Short Term Goal 3 - Progress (Week 1): Partly met PT Short Term Goal 4 (Week 1): Patient will perform gait training on unit with LRAD x 50' with min A PT Short Term Goal 4 - Progress (Week 1): Partly met PT Short Term Goal 5 (Week 1): Patient will perform up and down 3 stairs with one rail and mod A PT Short Term Goal 5 - Progress (Week 1): Partly met Week 2:  PT Short Term Goal 1 (Week 2): =LTG of supervision-min A overall  Skilled Therapeutic Interventions/Progress Updates:   Daughter present for very end of session; recommended that daughter be present for OT, PT and ST tomorrow morning for full hands on education and observe patient's current functional abilities.     Therapy Documentation Precautions:  Precautions Precautions: Fall Precaution Comments: maintatin SpO2 > 90% with trials of room air.  Monitor vitals Restrictions Weight Bearing Restrictions: No Pain: Pain Assessment Pain Assessment: No/denies pain Locomotion : Ambulation Ambulation/Gait Assistance: 4: Min assist  P&O did not arrive during therapy session to fit patient with Foot up brace for R foot; use of trial foot up brace and use of elastic shoe laces to assist with shoe donning and doffing when edema present.  Performed higher level gait training with community challenges to simulate  environment for when patient goes out to outpatient and assess energy expenditure.  Performed gait with RW and foot up brace during direction changes weaving L and R and sidestepping L and R around obstacles with verbal cues to attend to R side of RW to avoid hitting obstacle, stepping over low threshold while lifting RW over threshold, stepping on and off curb with min A for safe RW management, and over compliant surface to simulate grassy terrain.  Performed x 2 with sitting rest break in between; as patient fatigued she required increased verbal and tactile cues for sequencing and safety.  Daughter present for end of session: demonstrated and had daughter give repeat demonstration of stair negotiation up and down 3 stairs with bilat HHA with patient verbalizing LE stepping sequence.    See FIM for current functional status  Therapy/Group: Individual Therapy  Edman Circle [facility] 01/15/2012, 4:37 PM

## 2012-01-15 NOTE — Progress Notes (Signed)
Patient ID: Courtney Faulkner, female   DOB: 08/26/1951, 61 y.o.   MRN: [id number]  Subjective/Complaints: No c/os  Pt feels ok, still with sutures under R breast Review of Systems  Gastrointestinal: Positive for constipation.  Genitourinary: Positive for urgency.   Objective: Vital Signs: Blood pressure 107/69, pulse 112, temperature 98.1 F (36.7 C), temperature source Oral, resp. rate 20, height 5\' 3"  (1.6 m), weight 84.7 kg (186 lb 11.7 oz), SpO2 78.00%. No results found. Results for orders placed during the hospital encounter of 01/02/12 (from the past 72 hour(s))  PROTIME-INR     Status: Abnormal   Collection Time   01/13/12  4:15 AM      Component Value Range Comment   Prothrombin Time 21.6 (*) 11.6 - 15.2 (seconds)    INR 1.84 (*) 0.00 - 1.49    PROTIME-INR     Status: Abnormal   Collection Time   01/14/12  5:50 AM      Component Value Range Comment   Prothrombin Time 21.2 (*) 11.6 - 15.2 (seconds)    INR 1.80 (*) 0.00 - 1.49    PROTIME-INR     Status: Abnormal   Collection Time   01/15/12  5:40 AM      Component Value Range Comment   Prothrombin Time 22.2 (*) 11.6 - 15.2 (seconds)    INR 1.91 (*) 0.00 - 1.49       HEENT: normal Cardio: RRR Resp: CTA B/L GI: BS positive Extremity:  No Edema Skin:   Intact, sutures under R breast Neuro: Flat, Abnormal Motor 3-/5 in R delt,bi,tri,grip,3/5 R HF,KE,Ankle DF, 5/5 on L side and Abnormal FMC Ataxic/ dec FMC Musc/Skel:  Normal Strength showing improvement in RUE. Just waking up this am  Assessment/Plan: 1. Functional deficits secondary to L ICA infarct which require 3+ hours per day of interdisciplinary therapy in a comprehensive inpatient rehab setting. Physiatrist is providing close team supervision and 24 hour management of active medical problems listed below. Physiatrist and rehab team continue to assess barriers to discharge/monitor patient progress toward functional and medical goals.   FIM: FIM - Bathing Bathing  Steps Patient Completed: Chest;Right Arm;Left Arm;Abdomen;Front perineal area;Right upper leg;Left upper leg;Right lower leg (including foot);Left lower leg (including foot) Bathing: 4: Min-Patient completes 8-9 29f 10 parts or 75+ percent  FIM - Upper Body Dressing/Undressing Upper body dressing/undressing steps patient completed: Thread/unthread right bra strap;Thread/unthread left bra strap;Thread/unthread right sleeve of pullover shirt/dresss;Thread/unthread left sleeve of pullover shirt/dress;Put head through opening of pull over shirt/dress;Pull shirt over trunk Upper body dressing/undressing: 4: Min-Patient completed 75 plus % of tasks FIM - Lower Body Dressing/Undressing Lower body dressing/undressing steps patient completed: Thread/unthread right underwear leg;Thread/unthread left underwear leg;Pull underwear up/down;Thread/unthread left pants leg;Pull pants up/down Lower body dressing/undressing: 3: Mod-Patient completed 50-74% of tasks  FIM - Toileting Toileting steps completed by patient: Adjust clothing prior to toileting;Performs perineal hygiene;Adjust clothing after toileting Toileting Assistive Devices: Grab bar or rail for support Toileting: 4: Steadying assist  FIM - Diplomatic Services operational officer Devices: Grab bars;Walker Toilet Transfers: 4-From toilet/BSC: Min A (steadying Pt. > 75%)  FIM - Bed/Chair Transfer Bed/Chair Transfer Assistive Devices: Therapist, occupational: 5: Chair or W/C > Bed: Supervision (verbal cues/safety issues);5: Bed > Chair or W/C: Supervision (verbal cues/safety issues)  FIM - Locomotion: Wheelchair Distance: 150 Locomotion: Wheelchair: 1: Total Assistance/staff pushes wheelchair (Pt<25%) FIM - Locomotion: Ambulation Locomotion: Ambulation Assistive Devices: Walker - Rolling;Orthosis Ambulation/Gait Assistance: 4: Min assist Locomotion: Ambulation: 1: [employer]  less than 50 ft with minimal assistance  (Pt.>75%)  Comprehension Comprehension Mode: Auditory Comprehension: 5-Understands complex 90% of the time/Cues < 10% of the time  Expression Expression Mode: Verbal Expression: 4-Expresses basic 75 - 89% of the time/requires cueing 10 - 24% of the time. Needs helper to occlude trach/needs to repeat words.  Social Interaction Social Interaction Mode: Not assessed Social Interaction: 5-Interacts appropriately 90% of the time - Needs monitoring or encouragement for participation or interaction.  Problem Solving Problem Solving: 3-Solves basic 50 - 74% of the time/requires cueing 25 - 49% of the time  Memory Memory: 4-Recognizes or recalls 75 - 89% of the time/requires cueing 10 - 24% of the time  2. Anticoagulation/DVT prophylaxis with Pharmaceutical: Coumadin 3. Pain Management: acetominophen Medical Problem List and Plan:  1. 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 Myxoma. 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 DVT Prophylaxis/Anticoagulation: Coumadin per pharmacy. 4. Paroxysmal atrial fibrillation. Amiodarone 200 mg day, Lopressor reduce to 25 mg twice a day. Monitor with increased mobility, some low BPs -reduced lopressor, one elevated HR monitor if persistent then increase lopressor again 5. CHF. Lasix 40 mg twice a day. Monitor for any signs of fluid overload. Daily weights , wean O2 as possible, still requiring nocturnal O2 recheck CXR if unable to wean further- no patient complaints 6. Constipation will adjust laxative medications 7.  Hypoglycemia  By hx, recent  CBGs ok, MRI of pancreas without sign of tumor LOS (Days) 13 A FACE TO FACE EVALUATION WAS PERFORMED  Glenys Snader E 01/15/2012, 8:28 AM

## 2012-01-15 NOTE — Progress Notes (Signed)
Sutures removed from underside of right breast without difficulty. Patient tolerated procedure well. Will continue to monitor sites for drainage. [name], Deyani Hegarty Elon Jester

## 2012-01-15 NOTE — Progress Notes (Addendum)
Nutrition Follow-up  Intake is 25 - 75%. Pt reports intake has decreased some because she is growing tired of the foods. Offered change in snacks or addition of supplements, however pt refused. Pt would like to discontinue snacks that are currently ordered. Pt states that she suspects her intake to improve once she is home and has more familiar foods.  RN reports that pt is now longer having scheduled CBG's and suspects blood sugar issues to have resolved as of this time.  Diet Order:  Heart Healthy diet with Snacks TID  Meds: Scheduled Meds:   . amiodarone  200 mg Oral Daily  . aspirin EC  81 mg Oral Daily  . methylphenidate  5 mg Oral BID WC  . metoprolol tartrate  25 mg Oral BID  . pantoprazole  40 mg Oral QAC breakfast  . senna-docusate  2 tablet Oral BID  . sodium chloride  10-40 mL Intracatheter Q12H  . warfarin  5 mg Oral ONCE-1800  . Warfarin - Pharmacist Dosing Inpatient   Does not apply q1800  . DISCONTD: warfarin  2.5 mg Oral Custom  . DISCONTD: warfarin  5 mg Oral Custom   Continuous Infusions:  PRN Meds:.acetaminophen, albuterol, ondansetron (ZOFRAN) IV, ondansetron, polyethylene glycol, sodium chloride, sorbitol, traMADol  Labs:  CMP     Component Value Date/Time   NA 136 01/07/2012 1310   K 3.7 01/07/2012 1310   CL 100 01/07/2012 1310   CO2 27 01/07/2012 1310   GLUCOSE 116* 01/07/2012 1310   BUN 10 01/07/2012 1310   CREATININE 0.65 01/07/2012 1310   CALCIUM 9.2 01/07/2012 1310   PROT 6.0 01/03/2012 0540   ALBUMIN 2.3* 01/03/2012 0540   AST 29 01/03/2012 0540   ALT 44* 01/03/2012 0540   ALKPHOS 93 01/03/2012 0540   BILITOT 0.3 01/03/2012 0540   GFRNONAA >90 01/07/2012 1310   GFRAA >90 01/07/2012 1310   Lipid Panel     Component Value Date/Time   CHOL 134 12/16/2011 0311   TRIG 90 12/16/2011 0311   HDL 45 12/16/2011 0311   CHOLHDL 3.0 12/16/2011 0311   VLDL 18 12/16/2011 0311   LDLCALC 71 12/16/2011 0311    CBG (last 3)  No results found for this basename: GLUCAP:3 in  the last 72 hours   Intake/Output Summary (Last 24 hours) at 01/15/12 0918 Last data filed at 01/15/12 0800  Gross per 24 hour  Intake    840 ml  Output      2 ml  Net    838 ml    Weight Status:  84.7 kg - wt stable  Estimated needs:  1450 - 1650 kcal, 65 - 75 grams protein  Nutrition Dx:  Altered nutrition-related lab values - resolved.  Goal:  Pt to consume at least 75% of meals and snacks. Unmet.  Maintain adequate blood sugar control. Met.  Intervention:  1. Discontinue snacks, pt refusing. 2. Recommend consideration of diet liberalization to help with PO intake 3. RD to continue to follow nutrition care plan  Monitor:  PO intake, weights, labs, I/O's  Adair Laundry Pager #:  [phone]

## 2012-01-15 NOTE — Progress Notes (Signed)
Orthopedic Tech Progress Note Patient Details:  Courtney Faulkner 09/10/1950 [id number] Orthotic brace fitted by Roland Earl. Patient ID: Courtney Faulkner, female   DOB: 12/27/1950, 61 y.o.   MRN: [id number]   Courtney Faulkner 01/15/2012, 6:35 PM

## 2012-01-16 DIAGNOSIS — G8110 Spastic hemiplegia affecting unspecified side: Secondary | ICD-10-CM

## 2012-01-16 DIAGNOSIS — I6330 Cerebral infarction due to thrombosis of unspecified cerebral artery: Secondary | ICD-10-CM

## 2012-01-16 DIAGNOSIS — Z5189 Encounter for other specified aftercare: Secondary | ICD-10-CM

## 2012-01-16 LAB — PROTIME-INR: Prothrombin Time: 22.2 seconds — ABNORMAL HIGH (ref 11.6–15.2)

## 2012-01-16 MED ORDER — WARFARIN SODIUM 7.5 MG PO TABS
7.5000 mg | ORAL_TABLET | Freq: Once | ORAL | Status: AC
  Administered 2012-01-16: 7.5 mg via ORAL
  Filled 2012-01-16: qty 1

## 2012-01-16 NOTE — Discharge Summary (Signed)
Courtney Faulkner, Courtney Faulkner              ACCOUNT NO.:  [id number]  MEDICAL RECORD NO.:  [id number]  LOCATION:  4144                         FACILITY:  [facility]  PHYSICIAN:  Erick Colace, M.D.DATE OF BIRTH:  10-07-1950  DATE OF ADMISSION:  01/02/2012 DATE OF DISCHARGE:                              DISCHARGE SUMMARY   DISCHARGE DIAGNOSES: 1. Left middle cerebral artery territory infarction, left carotid     occlusion. 2. Myxoma status post left atrial myxoma excision, December 24, 2011. 3. Chronic Coumadin therapy for atrial fibrillation. 4. Congestive heart failure. 5. Constipation - resolved.  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  Admitted on December 14, 2011 with unremarkable past history, presented with altered mental status and slurred speech.  EMS was called by family, noted blood sugar of 34.  She was given an amp of D50, with repeat blood sugars of 54.  She had initial cranial CT scan, it was negative.  MRI of the brain showed moderate-sized acute left hemispheric infarction appeared to be secondary to acute left ICA carotid artery occlusion.  The patient did not receive tPA.  CT angiogram of the head showed acute nonhemorrhagic infarct, left basal ganglia and corona radiata.  CT angiogram of the neck with abnormal appearance of the left ICA artery with appearance of dissection with complete occlusion at the C2 level.  Carotid Doppler showed occluded left ICA.  Echocardiogram with ejection fraction 65% with no source of embolus.  Noted left atrial mass attached to interatrial septum, highly suggestive of myxoma.  TEE showed a 1.7 x 1.7 cm mass left atrial septum, again felt to be atrial myxoma.  Cardiac catheterization 12/18/2011 with normal coronary arteries.  Underwent excision of left atrial myxoma on 12/24/2011 per Dr. Barry Dienes.  Initially maintained on aspirin therapy at the  recommendation of Neurology. Subcutaneous Lovenox initiated for DVT prophylaxis.  The patient developed paroxysmal atrial fibrillation with followup Cardiology Services placed on amiodarone as well as Coumadin therapy.  She currently remained on Lovenox to INR greater than 2.  Speech Therapy evaluation 12/25/2011 noted deficits primarily with expression of thoughts and ideas evident with complex information.  She was admitted for comprehensive rehab program.  PAST MEDICAL HISTORY:  See discharge diagnoses.  SOCIAL HISTORY:  Married lives with husband.  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  FUNCTIONAL HISTORY:  Prior to admission was independent.  Functional status upon admission to rehab services was minimum moderate assist, needing cues for problem solving.  PHYSICAL EXAMINATION:  VITAL SIGNS:  Blood pressure 116/78, pulse 80, respirations 18, temperature 97.8. GENERAL:  This was an alert female in no acute distress. NEUROLOGIC:  Mood was flat with good eye contact with examiner.  She names person, place, situation, had some difficulty with her hospital course.  Followed 3 step commands, had a mild right facial droop, and tongue deviation. LUNGS:  Clear to auscultation. CARDIAC:  Rate controlled. ABDOMEN:  Soft, nontender.  Good bowel sounds.  REHABILITATION HOSPITAL COURSE:  The patient was admitted to Inpatient Rehab Services with therapies initiated on a 3-hour daily basis consisting of physical therapy, occupational therapy,  speech therapy, and rehabilitation nursing.  The following issues were addressed during the patient's rehabilitation stay.  Pertaining to [name]'s left middle cerebral artery territory infarction remained stable, therapies ongoing, she continued on chronic Coumadin therapy for atrial fibrillation, stroke prophylaxis with latest INR of 1.91.  She would follow up with Dr. Verdis Prime in regard to her atrial fibrillation, [phone], and to be followed  by her Coumadin Therapy with [facility].  A home health nurse had been arranged.  Status post left atrial myxoma excision, 12/24/2011, per Dr. Barry Dienes.  She would follow up with CVTS.  Her blood pressures were well- controlled during her hospital course with no orthostatic changes.  She exhibited no signs of fluid overload.  She did have some bouts of hypoglycemia.  Arrangements were made for snacks 3 times a day and monitored.  Her blood sugars normalized.  Her mood remained flat followed by Speech Therapy with noted issues primary with expression of thoughts and ideas.  She was placed on low-dose Ritalin, which she would continue until followup with Dr. Wynn Banker.  The patient received weekly collaborative interdisciplinary team conferences to discuss estimated length of stay, family teaching, and any barriers to discharge.  She was minimum assist to steady assist for bathing, minimal assist upper body dressing, minimal assist lower body dressing, minimal assist to steady assist for toileting, minimal assist for toilet transfers, minimal assist overall for mobility.  She was able to communicate her needs, but needed supervision overall for her safety.  Family teaching was completed and she was discharged to home on Jan 17, 2012.  DISCHARGE MEDICATIONS:  At time of dictation included: 1. Proventil inhaler 2 puffs every 6 hours as needed. 2. Amiodarone 200 mg daily. 3. Aspirin 81 mg daily. 4. Ritalin 5 mg b.i.d. 5. Lopressor 25 mg b.i.d. 6. Protonix 40 mg daily. 7. Ultram 50 to 100 mg every 4 hours as needed for pain. 8. Coumadin 5 mg daily adjusted accordingly for an INR of 2.0 to 3.0.  DIET:  Heart healthy diet.  SPECIAL INSTRUCTIONS:  Home health nurse to check INR on Tuesday, 01/21/2012, results to Dr. Verdis Prime, [phone], fax number [phone]. [facility].  The patient to follow up with Dr. Horald Chestnut, CVTS, call for  appointment.  Dr. Delia Heady, Neurology Services.  Dr. Claudette Laws, appointment to be made at the outpatient rehab center.  Case Management was working to arrange for a primary care provider, as the patient did not have one prior to hospital admission.     Mariam Dollar, P.A.   ______________________________ Erick Colace, M.D.    DA/MEDQ  D:  01/16/2012  T:  01/16/2012  Job:  [id number]  cc:   Lyn Records, M.D. Salvatore Decent. Cornelius Moras, M.D. Pramod P. Pearlean Brownie, MD

## 2012-01-16 NOTE — Progress Notes (Signed)
Physical Therapy Discharge Summary  Patient Details  Name: Courtney Faulkner MRN: [id number] Date of Birth: [birth year]  Today's Date: 01/16/2012 Time: [phone] and [id number] Time Calculation (min): 58 min and 30 min Patient has met 7 of 8 long term goals due to improved activity tolerance, improved balance, improved postural control, increased strength, ability to compensate for deficits, functional use of  right upper extremity and right lower extremity, improved attention and improved awareness and has improved her standing dynamic balance from a Berg score of 18/56 to 33/56 but patient continues to be at high risk for falls.  Patient to discharge at an ambulatory level Supervision with RW for household ambulation but requires intermittent min A for stair negotiation and short community distances with higher level gait challenges.  Patient still presents with decreased activity tolerance and endurance and will D/C with w/c for longer community distances.  Patient was also fit for R foot up brace to assist with R foot clearance during household and community ambulation to minimize falls risk.   Patient's care partner is independent to provide the necessary physical and supervision assistance at discharge.  Reasons goals not met: Patient did not reach standing balance goal of supervision and a score of >45/56 on Berg.  Patient continues to require supervision-min A for dynamic standing balance placing her at increased risk for falls without UE support.  Patient and family have been educated on floor > furniture transfers, the need for 24/7 supervision especially during shower transfers and stair negotiation.  Recommendation:  Patient will benefit from ongoing skilled PT services in home health setting to continue to advance safe functional mobility, address ongoing impairments in activity tolerance/endurance, impaired initiation and safety awareness, R sided weakness and impaired timing and sequencing,  impaired dynamic standing balance, gait and minimize fall risk.  Equipment: RW, w/c both adjusted to patient's height, brakes adjusted for safety, R foot up brace  Reasons for discharge: treatment goals met and discharge from hospital  Patient/family agrees with progress made and goals achieved: Yes  PT Discharge Precautions/Restrictions Precautions Precautions: Fall Pain Pain Assessment Pain Assessment: No/denies pain Pain Score: 0-No pain Sensation Sensation Light Touch: Appears Intact Stereognosis: Appears Intact Hot/Cold: Appears Intact Proprioception: Appears Intact Coordination Gross Motor Movements are Fluid and Coordinated: No Fine Motor Movements are Fluid and Coordinated: No Coordination and Movement Description: weakness and decreased activity tolerance impacting her coordination and movement Motor  Motor Motor: Hemiplegia Motor - Discharge Observations: R hemiplegia with impairments in motor control, timing, sequencing and mild inattention to R  Mobility Bed Mobility Bed Mobility: Supine to Sit;Sit to Supine Supine to Sit: 5: Supervision;HOB flat Supine to Sit Details: Verbal cues for sequencing Sit to Supine: 5: Supervision;HOB flat Sit to Supine - Details: Verbal cues for sequencing Sit to Supine - Details (indicate cue type and reason): With family present patient demonstrated supine <> sit on flat ADL bed with supervision with verbal cues for sequence to bring LE into bed, reposition in bed and to initiate supine > sit. Transfers Stand Pivot Transfers: 5: Supervision;With armrests and with RW Stand Pivot Transfer Details: Verbal cues for sequencing;Verbal cues for technique;Verbal cues for precautions/safety Stand Pivot Transfer Details (indicate cue type and reason): With family present performed stand pivot transfers with RW for bed <> w/c and w/c <> car (tall call and low car) with supervision but still requires verbal cues for safe foot and hand placement  and safe sequence especially with pivoting.  Family gave repeat demonstration with each  transfer and gave appropriate verbal cues for safety and sequencing.  Performed simulated walk in shower transfer with use of grab bars and min HHA to step over shower lip to enter and exit shower and sit on seat; required 2 reps and total verbal cues to perform safely.  Demonstrated to patient how to perform floor > furniture transfer; patient gave repeat demonstration from long sit > tall kneeling > half kneel > stand with min A and total verbal cues for sequence; discussed with patient and family signs and symptoms of another CVA or fracture and indications for calling EMS; family verbalized understanding. Locomotion  Patient received R foot up brace from P&O for home and community ambulation; demonstrated to daughter how to don and doff foot up brace and indications for wear.  Ambulation Ambulation/Gait Assistance: 4: Min assist x 50' on level surface, controlled and home environment but requires intermittent min A for dual task ambulation and higher level community challenges secondary to impaired safety awareness, problem solving/sequencing and balance Stairs / Additional Locomotion Stairs: Yes Stairs Assistance: 4: Min assist Stairs Assistance Details: Verbal cues for technique;Verbal cues for sequencing Stairs Assistance Details (indicate cue type and reason): Up and down 4 stairs with bilat HHA for safety and balance secondary to lack of rails at home for entry and exit; family assisted patient with HHA and gave appropriate verbal cues for stepping sequence and safety. Stair Management Technique: No rails;Step to pattern;Forwards Number of Stairs: 4  Height of Stairs: 6.5  Wheelchair Mobility Wheelchair Mobility: No  Balance Standardized Balance Assessment Standardized Balance Assessment: Hospital doctor Berg Balance Test Sit to Stand: Able to stand  independently using hands Standing Unsupported:  Able to stand 30 seconds unsupported Sitting with Back Unsupported but Feet Supported on Floor or Stool: Able to sit safely and securely 2 minutes Stand to Sit: Controls descent by using hands Transfers: Able to transfer safely, definite need of hands Standing Unsupported with Eyes Closed: Able to stand 10 seconds with supervision Standing Ubsupported with Feet Together: Able to place feet together independently and stand for 1 minute with supervision From Standing, Reach Forward with Outstretched Arm: Can reach confidently >25 cm (10") From Standing Position, Pick up Object from Floor: Able to pick up shoe safely and easily From Standing Position, Turn to Look Behind Over each Shoulder: Looks behind one side only/other side shows less weight shift Turn 360 Degrees: Needs assistance while turning Standing Unsupported, Alternately Place Feet on Step/Stool: Needs assistance to keep from falling or unable to try Standing Unsupported, One Foot in Front: Needs help to step but can hold 15 seconds Standing on One Leg: Unable to try or needs assist to prevent fall Total Score: 33  Patient demonstrates increased fall risk as noted by score of 33/56 on Berg Balance Scale.  (<36= high risk for falls, close to 100%; 37-45 significant >80%; 46-51 moderate >50%; 52-55 lower >25%) Discussed with patient increased falls risk and functional activities that may place her at risk for falls and ways to improve balance and safety with these activities.  Extremity Assessment  RUE Assessment RUE Assessment: Exceptions to [facility] RUE AROM (degrees) Overall AROM Right Upper Extremity: Within functional limits for tasks performed (requires increased time and occasional cues for use. ) LUE Assessment LUE Assessment: Within Functional Limits RLE Assessment RLE Assessment: Exceptions to [facility] RLE Strength RLE Overall Strength: Deficits RLE Overall Strength Comments: 4-/5 throughout except 3/5 hip flexion, 2/5 ankle  DF LLE Strength LLE Overall Strength:  Within Functional Limits for tasks assessed  See FIM for current functional status  Edman Circle [facility] 01/16/2012, 4:24 PM

## 2012-01-16 NOTE — Progress Notes (Signed)
Speech Language Pathology Daily Session Note & Discharge Summary  Patient Details  Name: PATTRICIA WEIHER MRN: [id number] Date of Birth: 09-15-1950  Today's Date: 01/16/2012 Time: 1030-1125 Time Calculation (min): 55 min  Short Term Goals: Week 2: SLP Short Term Goal 1 (Week 2): Patient will use strategies to increase accuracy during description and explanations of complex information with min verbal cueing. SLP Short Term Goal 2 (Week 2): Patient will increase intellectual awareness by stating details of diagnosis and medical interventions with mod verbal cues. SLP Short Term Goal 3 (Week 2): Patient will initiate and self correct paraphasias in conversation with min verbal question cues. SLP Short Term Goal 4 (Week 2): Patient will utilize memory compensatory strategies/aids with min verbal cues.   Skilled Therapeutic Interventions: Session focused on educating the patient and family; however no family was present; SLP facilitated session with increased wait time and minimal assist semantic cues to utilize strategies such as written work, antonyms, and description as needed during moments of word finding difficulty.  Patient told SLP that her family was frustrating her because they were not giving her enough time to verbally express herself and talking for her at times.  SLP encouraged patient to advocate for herself and tell her family that.  SLP also facilitated session with minimal assist semantic cues to solve moderately complex problems via written tasks especially with presence of complex written directions.    Daily Session Precautions/Restrictions  Precautions Precautions: Fall FIM:  Comprehension Comprehension Mode: Auditory Comprehension: 5-Follows basic conversation/direction: With no assist Expression Expression Mode: Verbal Expression: 4-Expresses basic 75 - 89% of the time/requires cueing 10 - 24% of the time. Needs helper to occlude trach/needs to repeat words. Social  Interaction Social Interaction: 5-Interacts appropriately 90% of the time - Needs monitoring or encouragement for participation or interaction. Problem Solving Problem Solving: 5-Solves basic 90% of the time/requires cueing < 10% of the time Memory Memory: 4-Recognizes or recalls 75 - 89% of the time/requires cueing 10 - 24% of the time General  Amount of Missed SLP Time (min): 10 Minutes Pain Pain Assessment Pain Assessment: No/denies pain Pain Score: 0-No pain  Therapy/Group: Individual Therapy   Speech Language Pathology Discharge Summary  Patient Details  Name: IVET GUERRIERI MRN: [id number] Date of Birth: December 04, 1950  Today's Date: 01/16/2012  Patient has met 4 of 5 long term goals due to functional gains in verbal expression and cognition.  Patient to discharge at overall supervision assist with basic and Min Assist level with moderately complex tasks.  Patient's care partner was not present for any education prior to discharge.  Reasons goals not met: patient continues to demonstrate difficulty sustaining her attention to tasks for longer periods of time.  Recommendation:  Patient will benefit from ongoing skilled SLP services in home health setting and then outpatient to continue to advance functional skills in the area of functional communication, maximize cognitive function and overall independence and reduce care partner burden.  Equipment: none  Reasons for discharge: discharge from hospital  Patient/family agrees with progress made and goals achieved: Yes  See FIM for current functional status  Charlane Ferretti., CCC-SLP [phone]  Tabbitha Janvrin 01/16/2012, 1:38 PM

## 2012-01-16 NOTE — Discharge Instructions (Signed)
Inpatient Rehab Discharge Instructions  Courtney Faulkner Discharge date and time: No discharge date for patient encounter.   Activities/Precautions/ Functional Status: Activity: activity as tolerated Diet: cardiac diet Wound Care: none needed Functional status:  ___ No restrictions     ___ Walk up steps independently __x_ 24/7 supervision/assistance   __x_ Walk up steps with assistance ___ Intermittent supervision/assistance  ___ Bathe/dress independently ___ Walk with walker     ___ Bathe/dress with assistance ___ Walk Independently    ___ Shower independently __x_ Walk with assistance    __x STROKE/TIA DISCHARGE INSTRUCTIONS SMOKING Cigarette smoking nearly doubles your risk of having a stroke & is the single most alterable risk factor  If you smoke or have smoked in the last 12 months, you are advised to quit smoking for your health.  Most of the excess cardiovascular risk related to smoking disappears within a year of stopping.  Ask you doctor about anti-smoking medications  [place] Quit Line: 1-800-QUIT NOW  Free Smoking Cessation Classes [phone]  CHOLESTEROL Know your levels; limit fat & cholesterol in your diet  Lab Results  Component Value Date   CHOL 134 12/16/2011   HDL 45 12/16/2011   LDLCALC 71 12/16/2011   TRIG 90 12/16/2011   CHOLHDL 3.0 12/16/2011      Many patients benefit from treatment even if their cholesterol is at goal.  Goal: Total Cholesterol less than 160  Goal:  LDL less than 100  Goal:  HDL greater than 40  Goal:  Triglycerides less than 150  BLOOD PRESSURE American Stroke Association blood pressure target is less that 120/80 mm/Hg  Your discharge blood pressure is:  BP: 126/84 mmHg  Monitor your blood pressure  Limit your salt and alcohol intake  Many individuals will require more than one medication for high blood pressure  DIABETES (A1c is a blood sugar average for last 3 months) Goal A1c is under 7% (A1c is blood sugar average for last 3  months)  Diabetes: No known diagnosis of diabetes    Lab Results  Component Value Date   HGBA1C 5.1 12/20/2011    Your A1c can be lowered with medications, healthy diet, and exercise.  Check your blood sugar as directed by your physician  Call your physician if you experience unexplained or low blood sugars.  PHYSICAL ACTIVITY/REHABILITATION Goal is 30 minutes at least 4 days per week    Activity decreases your risk of heart attack and stroke and makes your heart stronger.  It helps control your weight and blood pressure; helps you relax and can improve your mood.  Participate in a regular exercise program.  Talk with your doctor about the best form of exercise for you (dancing, walking, swimming, cycling).  DIET/WEIGHT Goal is to maintain a healthy weight  Your height is:  Height: 5\' 3"  (160 cm) Your current weight is: Weight: 85.2 kg (187 lb 13.3 oz) Your body Mass Index (BMI) is:  BMI (Calculated): 34.6   Following the type of diet specifically designed for you will help prevent another stroke.  Your goal Body Mass Index (BMI) is 19-24.  Healthy food habits can help reduce 3 risk factors for stroke:  High cholesterol, hypertension, and excess weight.     _ Shower with assistance ___ No alcohol     ___ Return to work/school ________  Special Instructions: Home health nurse to check INR on Tuesday, May 28 results to Dr. Alfonse Ras with [facility] cardiology Coumadin clinic [phone] fax [phone].  COMMUNITY REFERRALS UPON DISCHARGE:    Home Health:   PT, OT, SPT, RN   Agency:[facility] Phone:[phone] Date of last service:01/17/2012  Outpatient: PT, OT, SPT  Agency: [facility] Phone: [phone]   Appointment Date/Time:JUNE 11 Tuesday  9:15-12:00  Medical Equipment/Items Ordered:WHEELCHAIR, Selinda Flavin, BSC, Levan Hurst  [facility] [phone]   GENERAL COMMUNITY RESOURCES FOR PATIENT/FAMILY: Support Groups: [facility] SUPPORT GROUP     My  questions have been answered and I understand these instructions. I will adhere to these goals and the provided educational materials after my discharge from the hospital.  Patient/Caregiver Signature _______________________________ Date __________  Clinician Signature _______________________________________ Date __________  Please bring this form and your medication list with you to all your follow-up doctor's appointments.

## 2012-01-16 NOTE — Progress Notes (Signed)
Occupational Therapy Session Note  Patient Details  Name: Courtney Faulkner MRN: [id number] Date of Birth: 06/13/1951  Today's Date: 01/16/2012 Time: 0815-0900 Time Calculation (min): 45 min  Short Term Goals: Week 2:  OT Short Term Goal 1 (Week 2): Pt. will be min assist with LB bathing  OT Short Term Goal 2 (Week 2): Pt. will transfer to toilet with min assist  OT Short Term Goal 3 (Week 2): Pt will complete grooming in standing with supervision/cues. OT Short Term Goal 4 (Week 2): Pt will complete simple meal prep with min assist at sit to stand level.  Skilled Therapeutic Interventions/Progress Updates:    Pt completed ADL retraining at walk-in shower level in room shower.  Pt's daughter initially not present for family education, but arrived during dressing portion of session.  Pt completed toilet transfer with supervision with use of RW and min verbal cues for body positioning prior to sit <> stand.  Pt completed walk-in shower transfer with min assist.  Encouragement provided for pt to attempt LB dressing with donning pants and socks, pt demonstrated increased independence with LB dressing by bringing leg up to opposite knee to assist secondary to SOB with bending forward.  Pt required assist with donning shoes.  Educated pt's daughter on cues to provide pt prior to assisting to allow pt to attempt first and provided education on stand by assist when pt is standing for grooming, LB dressing, or clothing management with toileting.  Engaged pt and daughter in discussion regarding energy conservation with BADL and iADL.  Therapy Documentation Precautions:  Precautions Precautions: Fall Precaution Comments: maintatin SpO2 > 90% with trials of room air.  Monitor vitals Restrictions Weight Bearing Restrictions: No Pain: Pain Assessment Pain Assessment: No/denies pain ADL: ADL Grooming: Modified independent Where Assessed-Grooming: Sitting at sink Upper Body Bathing: Setup Where  Assessed-Upper Body Bathing: Sitting at sink;Shower Lower Body Bathing: Supervision/safety;Setup;Minimal cueing Where Assessed-Lower Body Bathing: Sitting at sink;Standing at sink;Shower Upper Body Dressing: Supervision/safety Where Assessed-Upper Body Dressing: Sitting at sink Lower Body Dressing: Minimal assistance Where Assessed-Lower Body Dressing: Sitting at sink;Standing at sink Toileting: Minimal assistance Where Assessed-Toileting: Toilet;Bedside Commode (BSC over toilet) Toilet Transfer: Minimal assistance Toilet Transfer Method: Stand pivot;Ambulating (with RW) Toilet Transfer Equipment: Bedside commode (BSC over toilet) Tub/Shower Transfer: Not assessed Film/video editor: Minimal assistance Film/video editor Method: Ambulating (with RW) Astronomer: Shower seat with back;Grab bars ADL Comments: PT continues to require increased time for problem solving with bathing and dressing.  Pt tends to get SOB with bending forward for LB bathing and dressing, pt is demonstrating increased ability to cross ankles over her knees to assist with donning pants and socks.  Pt requires occasional cues for deep breathing techniques.  See FIM for current functional status  Therapy/Group: Individual Therapy  Leonette Monarch 01/16/2012, 1:31 PM

## 2012-01-16 NOTE — Plan of Care (Signed)
Problem: RH Dressing Goal: LTG Patient will perform lower body dressing w/assist (OT) LTG: Patient will perform lower body dressing with assist, with/without cues in positioning using equipment (OT)  Outcome: Not Met (add Reason) Pt currently requires min assist, requiring physical assist donning shoes and foot up brace.  Pt's family able to provide this assist.

## 2012-01-16 NOTE — Progress Notes (Signed)
Patient ID: Courtney Faulkner, female   DOB: 07/06/1951, 61 y.o.   MRN: [id number]  Subjective/Complaints: No c/os  Pt feels ok, still with sutures under R breast Review of Systems  Gastrointestinal: Positive for constipation.  Genitourinary: Positive for urgency.   Objective: Vital Signs: Blood pressure 126/84, pulse 80, temperature 98.1 F (36.7 C), temperature source Oral, resp. rate 18, height 5\' 3"  (1.6 m), weight 85.2 kg (187 lb 13.3 oz), SpO2 92.00%. Dg Chest 2 View  01/15/2012  *RADIOLOGY REPORT*  Clinical Data: CHF with atelectasis.  CHEST - 2 VIEW  Comparison: 01/02/2012  Findings: Lung volumes are low. The cardiopericardial silhouette is enlarged.  Bibasilar atelectasis or infiltrate has decreased in the interval.  Right PICC line remains in place with tip at the distal SVC level.  IMPRESSION: Low volume film with bibasilar atelectasis or infiltrate.  Original Report Authenticated By: ERIC A. MANSELL, M.D.   Results for orders placed during the hospital encounter of 01/02/12 (from the past 72 hour(s))  PROTIME-INR     Status: Abnormal   Collection Time   01/14/12  5:50 AM      Component Value Range Comment   Prothrombin Time 21.2 (*) 11.6 - 15.2 (seconds)    INR 1.80 (*) 0.00 - 1.49    PROTIME-INR     Status: Abnormal   Collection Time   01/15/12  5:40 AM      Component Value Range Comment   Prothrombin Time 22.2 (*) 11.6 - 15.2 (seconds)    INR 1.91 (*) 0.00 - 1.49    PROTIME-INR     Status: Abnormal   Collection Time   01/16/12  6:28 AM      Component Value Range Comment   Prothrombin Time 22.2 (*) 11.6 - 15.2 (seconds)    INR 1.91 (*) 0.00 - 1.49       HEENT: normal Cardio: RRR Resp: CTA B/L GI: BS positive Extremity:  No Edema Skin:   Intact, sutures under R breast Neuro: Flat, Abnormal Motor 3-/5 in R delt,bi,tri,grip,3/5 R HF,KE,Ankle DF, 5/5 on L side and Abnormal FMC Ataxic/ dec FMC Musc/Skel:  Normal Strength showing improvement in RUE. Just waking up this  am  Assessment/Plan: 1. Functional deficits secondary to L ICA infarct which require 3+ hours per day of interdisciplinary therapy in a comprehensive inpatient rehab setting. Physiatrist is providing close team supervision and 24 hour management of active medical problems listed below. Physiatrist and rehab team continue to assess barriers to discharge/monitor patient progress toward functional and medical goals.  Should be ready for D/c in am.  Family training in progress  FIM: FIM - Bathing Bathing Steps Patient Completed: Chest;Right Arm;Left Arm;Abdomen;Front perineal area;Buttocks;Right upper leg;Left upper leg;Right lower leg (including foot);Left lower leg (including foot) Bathing: 4: Steadying assist  FIM - Upper Body Dressing/Undressing Upper body dressing/undressing steps patient completed: Thread/unthread right bra strap;Thread/unthread left bra strap;Thread/unthread right sleeve of pullover shirt/dresss;Thread/unthread left sleeve of pullover shirt/dress;Put head through opening of pull over shirt/dress;Pull shirt over trunk Upper body dressing/undressing: 4: Min-Patient completed 75 plus % of tasks FIM - Lower Body Dressing/Undressing Lower body dressing/undressing steps patient completed: Thread/unthread right underwear leg;Thread/unthread left underwear leg;Pull underwear up/down;Thread/unthread left pants leg;Pull pants up/down;Thread/unthread right pants leg Lower body dressing/undressing: 4: Min-Patient completed 75 plus % of tasks  FIM - Toileting Toileting steps completed by patient: Adjust clothing prior to toileting;Performs perineal hygiene;Adjust clothing after toileting Toileting Assistive Devices: Grab bar or rail for support Toileting: 4: Steadying assist  FIM - Diplomatic Services operational officer Devices: Grab bars;Walker;Elevated toilet seat Toilet Transfers: 5-To toilet/BSC: Supervision (verbal cues/safety issues);5-From toilet/BSC: Supervision  (verbal cues/safety issues)  FIM - Banker Devices: Therapist, occupational: 4: Bed > Chair or W/C: Min A (steadying Pt. > 75%);4: Chair or W/C > Bed: Min A (steadying Pt. > 75%)  FIM - Locomotion: Wheelchair Distance: 150 Locomotion: Wheelchair: 1: Total Assistance/staff pushes wheelchair (Pt<25%) FIM - Locomotion: Ambulation Locomotion: Ambulation Assistive Devices: Walker - Rolling;Orthosis Ambulation/Gait Assistance: 4: Min assist Locomotion: Ambulation: 2: Travels 50 - 149 ft with minimal assistance (Pt.>75%)  Comprehension Comprehension Mode: Auditory Comprehension: 5-Understands complex 90% of the time/Cues < 10% of the time  Expression Expression Mode: Verbal Expression: 4-Expresses basic 75 - 89% of the time/requires cueing 10 - 24% of the time. Needs helper to occlude trach/needs to repeat words.  Social Interaction Social Interaction Mode: Not assessed Social Interaction: 5-Interacts appropriately 90% of the time - Needs monitoring or encouragement for participation or interaction.  Problem Solving Problem Solving: 4-Solves basic 75 - 89% of the time/requires cueing 10 - 24% of the time  Memory Memory: 4-Recognizes or recalls 75 - 89% of the time/requires cueing 10 - 24% of the time  2. Anticoagulation/DVT prophylaxis with Pharmaceutical: Coumadin 3. Pain Management: acetominophen Medical Problem List and Plan:  1. 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 Myxoma. 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 DVT Prophylaxis/Anticoagulation: Coumadin per pharmacy. 4. Paroxysmal atrial fibrillation. Amiodarone 200 mg day, Lopressor reduce to 25 mg twice a day. Monitor with increased mobility, some low BPs -reduced lopressor, one elevated HR monitor if persistent then increase lopressor again 5. CHF. Lasix 40 mg twice a day. Monitor for any signs of  fluid overload. Daily weights , wean O2 as possible, still requiring nocturnal O2 recheck CXR if unable to wean further- no patient complaints 6. Constipation will adjust laxative medications 7.  Hypoglycemia  By hx, recent  CBGs ok, MRI of pancreas without sign of tumor LOS (Days) 14 A FACE TO FACE EVALUATION WAS PERFORMED  Yasenia Reedy E 01/16/2012, 8:32 AM

## 2012-01-16 NOTE — Discharge Summary (Signed)
  Discharge summary job [phone]

## 2012-01-16 NOTE — Progress Notes (Signed)
Social Work Patient ID: Courtney Faulkner, female   DOB: 1951-07-18, 61 y.o.   MRN: [id number] Met with pt, husband and daughter who were here for family education to discuss discharge needs. All report it went very well.  Pt states: " They did not realize how well I am doing."  Pt has agreed to starting off  With home health therapies and transitioning to OP Rehab.  Also has agreed to a wheelchair for community use. Have made referral to [facility]-wheelchair, tub seat, bsc, rolling walker to deliver to pt;'s room. Referral made to [facility] for RN, PT, SPT, OT.  All pleased with pt's progress and ready for discharge tomorrow. Pt continues to want to have OP appt made so can transition in the next two and three weeks.

## 2012-01-16 NOTE — Progress Notes (Signed)
ANTICOAGULATION CONSULT NOTE - Follow Up Consult  Pharmacy Consult:  Coumadin Indication: Atrial fibrillation  No Known Allergies  Patient Measurements: Height: 5\' 3"  (160 cm) Weight: 187 lb 13.3 oz (85.2 kg) IBW/kg (Calculated) : 52.4   Vital Signs: Temp: 99.4 F (37.4 C) (05/23 1540) Temp src: Oral (05/23 1540) BP: 115/76 mmHg (05/23 1540) Pulse Rate: 87  (05/23 1540)  Labs:  Courtney Faulkner 01/16/12 0628 01/15/12 0540 01/14/12 0550  HGB -- -- --  HCT -- -- --  PLT -- -- --  APTT -- -- --  LABPROT 22.2* 22.2* 21.2*  INR 1.91* 1.91* 1.80*  HEPARINUNFRC -- -- --  CREATININE -- -- --  CKTOTAL -- -- --  CKMB -- -- --  TROPONINI -- -- --    Estimated Creatinine Clearance: 78.3 ml/min (by C-G formula based on Cr of 0.65).  Assessment: 21 YOF with Afib s/p cardiac surgery for atrial myxoma and to continue on Coumadin therapy.  INR is subtherapeutic today.  No bleeding documented. Hgb up to 9.7 as of 01/10/12.   Goal of Therapy:  INR 2 - 3  Plan:  Coumadin 7.5 mg po tonight INR daily  [facility], [address].D., BCPS Clinical Pharmacist 01/16/2012 5:18 PM

## 2012-01-16 NOTE — Discharge Summary (Signed)
Occupational Therapy Discharge Summary  Patient Details  Name: Courtney Faulkner MRN: [id number] Date of Birth: 1951/07/10  Today's Date: 01/16/2012  Patient has met 9 of 10 long term goals due to improved activity tolerance, improved balance, ability to compensate for deficits, functional use of  RIGHT upper and RIGHT lower extremity, improved attention and improved awareness.  Patient to discharge at overall Supervision level.  Patient's care partner is independent to provide the necessary physical and cognitive assistance at discharge.  Pt requires the occasional min assist with LB dressing when donning shoes secondary to swelling in bilateral feet and requiring assist to don Rt foot up brace.  Pt's daughter and husband have demonstrated appropriate cues and assist with ambulation with RW, walk-in shower transfer, and cues for dressing (just daughter).    Reasons goals not met: Pt requires the occasional min assist with LB dressing when donning shoes secondary to swelling in bilateral feet and requiring assist to don Rt foot up brace  Recommendation:  Patient will benefit from ongoing skilled OT services in home health setting to continue to advance functional skills in the area of BADL, iADL and Reduce care partner burden.  Equipment: shower chair, 3 in 1 BSC, RW  Reasons for discharge: treatment goals met and discharge from hospital  Patient/family agrees with progress made and goals achieved: Yes  OT Discharge Precautions/Restrictions  Precautions Precautions: Fall General   Vital Signs   Pain Pain Assessment Pain Assessment: No/denies pain Pain Score: 0-No pain ADL ADL Grooming: Modified independent Where Assessed-Grooming: Sitting at sink Upper Body Bathing: Setup Where Assessed-Upper Body Bathing: Sitting at sink;Shower Lower Body Bathing: Supervision/safety;Setup;Minimal cueing Where Assessed-Lower Body Bathing: Sitting at sink;Standing at sink;Shower Upper Body  Dressing: Supervision/safety Where Assessed-Upper Body Dressing: Sitting at sink Lower Body Dressing: Minimal assistance Where Assessed-Lower Body Dressing: Sitting at sink;Standing at sink Toileting: Minimal assistance Where Assessed-Toileting: Toilet;Bedside Commode (BSC over toilet) Toilet Transfer: Minimal assistance Toilet Transfer Method: Stand pivot;Ambulating (with RW) Toilet Transfer Equipment: Bedside commode (BSC over toilet) Tub/Shower Transfer: Not assessed Film/video editor: Minimal assistance Film/video editor Method: Ambulating (with RW) Astronomer: Shower seat with back;Grab bars ADL Comments: PT continues to require increased time for problem solving with bathing and dressing.  Pt tends to get SOB with bending forward for LB bathing and dressing, pt is demonstrating increased ability to cross ankles over her knees to assist with donning pants and socks.  Pt requires occasional cues for deep breathing techniques. Vision/Perception  Vision - History Baseline Vision: Wears glasses all the time Vision - Assessment Eye Alignment: Within Functional Limits Additional Comments: Pt demonstrating improved attention to right  Cognition   Sensation Sensation Light Touch: Appears Intact Stereognosis: Appears Intact Hot/Cold: Appears Intact Proprioception: Appears Intact Coordination Gross Motor Movements are Fluid and Coordinated: No Fine Motor Movements are Fluid and Coordinated: No Coordination and Movement Description: weakness and decreased activity tolerance impacting her coordination and movement Extremity/Trunk Assessment RUE Assessment RUE Assessment: Exceptions to [facility] RUE AROM (degrees) Overall AROM Right Upper Extremity: Within functional limits for tasks performed (requires increased time and occasional cues for use. ) LUE Assessment LUE Assessment: Within Functional Limits  See FIM for current functional status  Leonette Monarch 01/16/2012, 3:26 PM

## 2012-01-17 DIAGNOSIS — I6330 Cerebral infarction due to thrombosis of unspecified cerebral artery: Secondary | ICD-10-CM

## 2012-01-17 DIAGNOSIS — G8110 Spastic hemiplegia affecting unspecified side: Secondary | ICD-10-CM

## 2012-01-17 DIAGNOSIS — Z5189 Encounter for other specified aftercare: Secondary | ICD-10-CM

## 2012-01-17 LAB — CBC
HCT: 30.8 % — ABNORMAL LOW (ref 36.0–46.0)
Hemoglobin: 9.7 g/dL — ABNORMAL LOW (ref 12.0–15.0)
MCHC: 31.5 g/dL (ref 30.0–36.0)
MCV: 92.2 fL (ref 78.0–100.0)
RDW: 15.6 % — ABNORMAL HIGH (ref 11.5–15.5)
WBC: 7.5 10*3/uL (ref 4.0–10.5)

## 2012-01-17 LAB — DIFFERENTIAL
Basophils Absolute: 0.1 10*3/uL (ref 0.0–0.1)
Eosinophils Relative: 4 % (ref 0–5)
Lymphocytes Relative: 36 % (ref 12–46)
Monocytes Absolute: 0.8 10*3/uL (ref 0.1–1.0)
Monocytes Relative: 10 % (ref 3–12)
Neutro Abs: 3.7 10*3/uL (ref 1.7–7.7)

## 2012-01-17 LAB — BASIC METABOLIC PANEL
Calcium: 9.6 mg/dL (ref 8.4–10.5)
GFR calc Af Amer: >90 mL/min (ref >90)
GFR calc non Af Amer: >90 mL/min (ref >90)
Glucose, Bld: 90 mg/dL (ref 70–99)
Potassium: 3 mEq/L — ABNORMAL LOW (ref 3.5–5.1)
Sodium: 138 mEq/L (ref 135–145)

## 2012-01-17 MED ORDER — WARFARIN SODIUM 5 MG PO TABS
5.0000 mg | ORAL_TABLET | Freq: Once | ORAL | Status: DC
  Filled 2012-01-17: qty 1

## 2012-01-17 MED ORDER — POTASSIUM CHLORIDE CRYS ER 20 MEQ PO TBCR: 20.0000 meq | EXTENDED_RELEASE_TABLET | Freq: Two times a day (BID) | ORAL | Status: DC

## 2012-01-17 MED ORDER — PANTOPRAZOLE SODIUM 40 MG PO TBEC: 40.0000 mg | DELAYED_RELEASE_TABLET | Freq: Every day | ORAL | Status: DC

## 2012-01-17 MED ORDER — TRAMADOL HCL 50 MG PO TABS: 50.0000 mg | ORAL_TABLET | ORAL | Status: AC | PRN

## 2012-01-17 MED ORDER — AMIODARONE HCL 200 MG PO TABS: 200.0000 mg | ORAL_TABLET | Freq: Every day | ORAL | Status: DC

## 2012-01-17 MED ORDER — METHYLPHENIDATE HCL 5 MG PO TABS: 5.0000 mg | ORAL_TABLET | Freq: Two times a day (BID) | ORAL | Status: DC

## 2012-01-17 MED ORDER — METOPROLOL TARTRATE 25 MG PO TABS: 25.0000 mg | ORAL_TABLET | Freq: Two times a day (BID) | ORAL | Status: DC

## 2012-01-17 NOTE — Progress Notes (Signed)
Patient ID: Courtney Faulkner, female   DOB: March 11, 1951, 61 y.o.   MRN: [id number]  Subjective/Complaints: No c/os  Pt feels ok,PICC still in.  Slept poorly a little nauseated thsi am.  No abd pain  Review of Systems  Respiratory: Negative for cough and shortness of breath.   Cardiovascular: Negative for chest pain, palpitations and orthopnea.  Gastrointestinal: Positive for nausea and constipation.  Genitourinary: Positive for urgency.  All other systems reviewed and are negative.   Objective: Vital Signs: Blood pressure 130/78, pulse 102, temperature 99.2 F (37.3 C), temperature source Oral, resp. rate 19, height 5\' 3"  (1.6 m), weight 84.5 kg (186 lb 4.6 oz), SpO2 90.00%. Dg Chest 2 View  01/15/2012  *RADIOLOGY REPORT*  Clinical Data: CHF with atelectasis.  CHEST - 2 VIEW  Comparison: 01/02/2012  Findings: Lung volumes are low. The cardiopericardial silhouette is enlarged.  Bibasilar atelectasis or infiltrate has decreased in the interval.  Right PICC line remains in place with tip at the distal SVC level.  IMPRESSION: Low volume film with bibasilar atelectasis or infiltrate.  Original Report Authenticated By: ERIC A. MANSELL, M.D.   Results for orders placed during the hospital encounter of 01/02/12 (from the past 72 hour(s))  PROTIME-INR     Status: Abnormal   Collection Time   01/15/12  5:40 AM      Component Value Range Comment   Prothrombin Time 22.2 (*) 11.6 - 15.2 (seconds)    INR 1.91 (*) 0.00 - 1.49    PROTIME-INR     Status: Abnormal   Collection Time   01/16/12  6:28 AM      Component Value Range Comment   Prothrombin Time 22.2 (*) 11.6 - 15.2 (seconds)    INR 1.91 (*) 0.00 - 1.49    PROTIME-INR     Status: Abnormal   Collection Time   01/17/12  6:20 AM      Component Value Range Comment   Prothrombin Time 26.0 (*) 11.6 - 15.2 (seconds)    INR 2.34 (*) 0.00 - 1.49       HEENT: normal Cardio: mild tachy reg rythmn Resp: CTA B/L GI: BS positive Extremity:  No  Edema Skin:   Intact, PICC line without erythema Neuro: Flat, Abnormal Motor 3-/5 in R delt,bi,tri,grip,3/5 R HF,KE,Ankle DF, 5/5 on L side and Abnormal FMC Ataxic/ dec FMC Musc/Skel:  Normal   Assessment/Plan: 1. Functional deficits secondary to L ICA infarct stable for D/C if repeat Labs ok outpt f/u: Neuro Primary MD PMR               FIM: FIM - Bathing Bathing Steps Patient Completed: Chest;Right Arm;Left Arm;Abdomen;Front perineal area;Buttocks;Right upper leg;Left upper leg;Right lower leg (including foot);Left lower leg (including foot) Bathing: 5: Supervision: Safety issues/verbal cues  FIM - Upper Body Dressing/Undressing Upper body dressing/undressing steps patient completed: Thread/unthread right bra strap;Thread/unthread left bra strap;Thread/unthread right sleeve of pullover shirt/dresss;Thread/unthread left sleeve of pullover shirt/dress;Put head through opening of pull over shirt/dress;Pull shirt over trunk Upper body dressing/undressing: 5: Set-up assist to: Obtain clothing/put away FIM - Lower Body Dressing/Undressing Lower body dressing/undressing steps patient completed: Thread/unthread right underwear leg;Thread/unthread left underwear leg;Pull underwear up/down;Thread/unthread right pants leg;Thread/unthread left pants leg;Pull pants up/down;Don/Doff right sock;Don/Doff left sock Lower body dressing/undressing: 4: Min-Patient completed 75 plus % of tasks  FIM - Toileting Toileting steps completed by patient: Adjust clothing prior to toileting;Performs perineal hygiene;Adjust clothing after toileting Toileting Assistive Devices: Grab bar or rail for support Toileting: 5: Supervision:  Safety issues/verbal cues  FIM - Diplomatic Services operational officer Devices: Elevated toilet seat;Walker;Grab bars (BSC over toilet) Toilet Transfers: 5-To toilet/BSC: Supervision (verbal cues/safety issues);5-From toilet/BSC: Supervision (verbal cues/safety issues)  FIM -  Banker Devices: Therapist, occupational: 5: Supine > Sit: Supervision (verbal cues/safety issues);5: Sit > Supine: Supervision (verbal cues/safety issues);5: Bed > Chair or W/C: Supervision (verbal cues/safety issues);5: Chair or W/C > Bed: Supervision (verbal cues/safety issues)  FIM - Locomotion: Wheelchair Distance: 150 Locomotion: Wheelchair: 1: Total Assistance/staff pushes wheelchair (Pt<25%) FIM - Locomotion: Ambulation Locomotion: Ambulation Assistive Devices: Walker - Rolling;Orthosis Ambulation/Gait Assistance: 4: Min assist Locomotion: Ambulation: 2: Travels 50 - 149 ft with supervision/safety issues  Comprehension Comprehension Mode: Auditory Comprehension: 5-Follows basic conversation/direction: With no assist  Expression Expression Mode: Verbal Expression: 4-Expresses basic 75 - 89% of the time/requires cueing 10 - 24% of the time. Needs helper to occlude trach/needs to repeat words.  Social Interaction Social Interaction Mode: Not assessed Social Interaction: 5-Interacts appropriately 90% of the time - Needs monitoring or encouragement for participation or interaction.  Problem Solving Problem Solving: 5-Solves basic 90% of the time/requires cueing < 10% of the time  Memory Memory: 4-Recognizes or recalls 75 - 89% of the time/requires cueing 10 - 24% of the time  2. Anticoagulation/DVT prophylaxis with Pharmaceutical: Coumadin 3. Pain Management: acetominophen Medical Problem List and Plan:  1. 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 Myxoma. 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 DVT Prophylaxis/Anticoagulation: Coumadin per pharmacy. 4. Paroxysmal atrial fibrillation. Amiodarone 200 mg day, Lopressor reduce to 25 mg twice a day. Monitor with increased mobility, some low BPs -reduced lopressor, one elevated HR monitor if persistent then  increase lopressor again 5. CHF. Lasix 40 mg twice a day. Monitor for any signs of fluid overload. Daily weights , wean O2 as possible, still requiring nocturnal O2 recheck CXR if unable to wean further- no patient complaints 6. Constipation will adjust laxative medications 7.  Hypoglycemia  By hx, recent  CBGs ok, MRI of pancreas without sign of tumor LOS (Days) 15 A FACE TO FACE EVALUATION WAS PERFORMED  Makenzi Bannister E 01/17/2012, 7:56 AM

## 2012-01-17 NOTE — Progress Notes (Signed)
Patient and spouse and daughter received written and verbal discharge instructions from Deatra Ina , [place] as well as written information from this RN and verbal information from dietician Lelon Mast on heart healthy diet, shopping, label reading. All deny any questions/concerns. Aware of follow up appointments. Personal belongings packed by family. Pt taken down by NT in wheelchair to private vehicle.[name], [name]

## 2012-01-17 NOTE — Care Management (Addendum)
Portable Health Profile   Name: Courtney Faulkner       Date Initiated: 01/17/2012 DO: 05/16/1951  Age: 61 y.o. Address: [address] [place] [address]  Phone:  [phone]  Emergency Contact    Name Relation Home Work Mobile   Maradiaga,Michael Spouse [phone]  [phone]     Insurance    1. BLUE CROSS BLUE SHIELD  [facility] [place] [id number]       Allergies  No Known Allergies  Immunizations on file    Primary Care Provider   Other Physicians  Dr Tressie Stalker  - Cardiovascular Surgeon Dr Verdis Prime III  - Cardiologist Dr Delia Heady  - Neurologist  Other Healthcare Providers/Preference    Hospital Preference    Diagnosis/Conditions from this Admission  Left CVA  Risk Factors/Limitations: vision, hearing, swallowing  24/7 supervision for safety Special Instructions

## 2012-01-17 NOTE — Progress Notes (Signed)
Social Work Discharge Note Discharge Note  The overall goal for the admission was met for:   Discharge location: Yes-HOME WITH HUSBAND, DAUGHTER AND MOTHER TO ASSIST  Length of Stay: Yes-15 DAYS  Discharge activity level: Yes-SUPERVISION/ MIN LEVEL  Home/community participation: Yes  Services provided included: MD, RD, PT, OT, SLP, RN, CM, TR, Pharmacy and SW  Financial Services: Private Insurance: [place] BCBS  Follow-up services arranged: Home Health: [facility]-PT, OT, SPT, RN, Outpatient: [facility]-PT, OT, SPT 6/11 9:15-12:00, DME: [facility]-WHEELCHAIR, BSC, ROLLING WALKER, TUB SEAT and Patient/Family has no preference for HH/DME agencies  Comments (or additional information):PT TO BEGIN WITH HOME HEALTH THERAPIES AND THEN TRANSITION TO OP THERAPIES AFTER 2 WEEKS. HUSBAND AND DAUGHTER HERE FOR PT AND PART OF OT FAMILY ED, BUT MISSED SPT FEEL PREPARED TO TAKE CARE OF PT AT HOME. READY FOR DISCHARGE  Patient/Family verbalized understanding of follow-up arrangements: Yes  Individual responsible for coordination of the follow-up plan: [employer] AND PT  Confirmed correct DME delivered: Lucy Chris 01/17/2012    Lucy Chris

## 2012-01-17 NOTE — Plan of Care (Signed)
Problem: Food- and Nutrition-Related Knowledge Deficit (NB-1.1) Goal: Nutrition education Formal process to instruct or train a patient/client in a skill or to impart knowledge to help patients/clients voluntarily manage or modify food choices and eating behavior to maintain or improve health.  Outcome: Completed/Met Date Met:  01/17/12 RD contacted by RN. Pt's family requesting Heart Healthy information prior to d/c. RN provided Heart Healthy Nutrition Therapy and Cooking Tips handout. RD arrived to room prior to pt's d/c to review handout. Pt and family asked appropriate questions and reviewed materials with them. Pt and family verbalized understanding. Discussed how to decrease sodium and fat in diet.   Expect good compliance.  Adair Laundry Pager#: [phone]

## 2012-01-17 NOTE — Plan of Care (Signed)
Problem: RH SKIN INTEGRITY Goal: RH STG ABLE TO PERFORM INCISION/WOUND CARE W/ASSISTANCE STG Able To Perform Incision/Wound Care With min Assistance.  Outcome: Completed/Met Date Met:  01/17/12 All incision are open to air, no additional care required

## 2012-01-17 NOTE — Progress Notes (Signed)
ANTICOAGULATION CONSULT NOTE - Follow Up Consult  Pharmacy Consult:  Coumadin Indication: Atrial fibrillation  No Known Allergies  Patient Measurements: Height: 5\' 3"  (160 cm) Weight: 186 lb 4.6 oz (84.5 kg) IBW/kg (Calculated) : 52.4   Vital Signs: Temp: 99.2 F (37.3 C) (05/24 0526) Temp src: Oral (05/24 0526) BP: 130/78 mmHg (05/24 0526) Pulse Rate: 102  (05/24 0526)  Labs:  Alvira Philips 01/17/12 0620 01/16/12 0628 01/15/12 0540  HGB -- -- --  HCT -- -- --  PLT -- -- --  APTT -- -- --  LABPROT 26.0* 22.2* 22.2*  INR 2.34* 1.91* 1.91*  HEPARINUNFRC -- -- --  CREATININE -- -- --  CKTOTAL -- -- --  CKMB -- -- --  TROPONINI -- -- --    Estimated Creatinine Clearance: 77.9 ml/min (by C-G formula based on Cr of 0.65).  Assessment: 59 YOF with Afib s/p cardiac surgery for atrial myxoma. Pt continues on Coumadin.  INR is therapeutic today.  No bleeding documented. Hgb up to 9.7 as of 01/10/12.   Goal of Therapy:  INR 2 - 3  Plan:  -Coumadin 5mg  po today, will attempt to resume home regimen if INR stabilizes. - Will f/up INR daily.  Gerturde Kuba K. Allena Katz, PharmD, BCPS.  Clinical Pharmacist Pager [phone]. 01/17/2012 9:53 AM

## 2012-01-22 ENCOUNTER — Ambulatory Visit: Payer: BC Managed Care – PPO | Admitting: Physical Therapy

## 2012-01-22 ENCOUNTER — Encounter: Payer: BC Managed Care – PPO | Admitting: Speech Pathology

## 2012-01-27 ENCOUNTER — Encounter: Payer: BC Managed Care – PPO | Admitting: Occupational Therapy

## 2012-02-04 ENCOUNTER — Ambulatory Visit: Payer: BC Managed Care – PPO | Admitting: Occupational Therapy

## 2012-02-04 ENCOUNTER — Ambulatory Visit
Payer: BC Managed Care – PPO | Attending: Physical Medicine & Rehabilitation | Admitting: Rehabilitative and Restorative Service Providers"

## 2012-02-04 ENCOUNTER — Ambulatory Visit: Payer: BC Managed Care – PPO | Admitting: Speech Pathology

## 2012-02-04 DIAGNOSIS — R269 Unspecified abnormalities of gait and mobility: Secondary | ICD-10-CM | POA: Insufficient documentation

## 2012-02-04 DIAGNOSIS — I69919 Unspecified symptoms and signs involving cognitive functions following unspecified cerebrovascular disease: Secondary | ICD-10-CM | POA: Insufficient documentation

## 2012-02-04 DIAGNOSIS — M6281 Muscle weakness (generalized): Secondary | ICD-10-CM | POA: Insufficient documentation

## 2012-02-04 DIAGNOSIS — Z5189 Encounter for other specified aftercare: Secondary | ICD-10-CM | POA: Insufficient documentation

## 2012-02-04 DIAGNOSIS — I69998 Other sequelae following unspecified cerebrovascular disease: Secondary | ICD-10-CM | POA: Insufficient documentation

## 2012-02-04 DIAGNOSIS — R279 Unspecified lack of coordination: Secondary | ICD-10-CM | POA: Insufficient documentation

## 2012-02-04 DIAGNOSIS — I69920 Aphasia following unspecified cerebrovascular disease: Secondary | ICD-10-CM | POA: Insufficient documentation

## 2012-02-10 ENCOUNTER — Ambulatory Visit: Payer: BC Managed Care – PPO | Admitting: Occupational Therapy

## 2012-02-10 ENCOUNTER — Ambulatory Visit: Payer: BC Managed Care – PPO | Admitting: Physical Therapy

## 2012-02-11 ENCOUNTER — Inpatient Hospital Stay: Payer: BC Managed Care – PPO | Admitting: Physical Medicine & Rehabilitation

## 2012-02-11 DIAGNOSIS — M542 Cervicalgia: Secondary | ICD-10-CM | POA: Insufficient documentation

## 2012-02-11 DIAGNOSIS — M25519 Pain in unspecified shoulder: Secondary | ICD-10-CM | POA: Insufficient documentation

## 2012-02-11 DIAGNOSIS — M79609 Pain in unspecified limb: Secondary | ICD-10-CM | POA: Insufficient documentation

## 2012-02-11 DIAGNOSIS — I69959 Hemiplegia and hemiparesis following unspecified cerebrovascular disease affecting unspecified side: Secondary | ICD-10-CM | POA: Insufficient documentation

## 2012-02-13 ENCOUNTER — Encounter: Payer: BC Managed Care – PPO | Attending: Physical Medicine & Rehabilitation

## 2012-02-13 ENCOUNTER — Encounter: Payer: Self-pay | Admitting: Physical Medicine & Rehabilitation

## 2012-02-13 ENCOUNTER — Ambulatory Visit (HOSPITAL_BASED_OUTPATIENT_CLINIC_OR_DEPARTMENT_OTHER): Payer: BC Managed Care – PPO | Admitting: Physical Medicine & Rehabilitation

## 2012-02-13 ENCOUNTER — Ambulatory Visit: Payer: BC Managed Care – PPO | Admitting: Speech Pathology

## 2012-02-13 VITALS — BP 141/67 | HR 87 | Resp 16 | Ht 64.0 in | Wt 181.2 lb

## 2012-02-13 DIAGNOSIS — G8110 Spastic hemiplegia affecting unspecified side: Secondary | ICD-10-CM

## 2012-02-13 DIAGNOSIS — M7582 Other shoulder lesions, left shoulder: Secondary | ICD-10-CM | POA: Insufficient documentation

## 2012-02-13 DIAGNOSIS — G890 Central pain syndrome: Secondary | ICD-10-CM | POA: Insufficient documentation

## 2012-02-13 DIAGNOSIS — M719 Bursopathy, unspecified: Secondary | ICD-10-CM

## 2012-02-13 DIAGNOSIS — G8114 Spastic hemiplegia affecting left nondominant side: Secondary | ICD-10-CM | POA: Insufficient documentation

## 2012-02-13 MED ORDER — METHYLPHENIDATE HCL 5 MG PO TABS: 5.0000 mg | ORAL_TABLET | Freq: Every morning | ORAL | Status: DC

## 2012-02-13 NOTE — Progress Notes (Addendum)
Subjective:    Patient ID: Darnelle Bos, female    DOB: 03-31-1951, 61 y.o.   MRN: [id number]  HPI First followup visit after discharge from hospital.has aching on the right side of the neck since hospital Left shoulder and hand pain Left ball of foot pain Pain Inventory Average Pain no pain Pain Right Now no pain My pain is no pain  In the last 24 hours, has pain interfered with the following? General activity no pain Relation with others no pain Enjoyment of life no pain What TIME of day is your pain at its worst? no pain Sleep (in general) Good  Pain is worse with: no pain Pain improves with: no pain Relief from Meds: no pain  Mobility use a walker how many minutes can you walk? only a few  ability to climb steps?  no do you drive?  no needs help with transfers  Function employed # of hrs/week 30 (prior to stroke) I need assistance with the following:  bathing, meal prep, household duties and shopping  Neuro/Psych weakness trouble walking loss of taste or smell  (Taste)  Prior Studies Any changes since last visit?  no  Physicians involved in your care Any changes since last visit?  yes Neurologist Alba Cory Cardiothoracic Surgeon, Dr Verdis Prime   Family History  Problem Relation Age of Onset  . Coronary artery disease Neg Hx   . Hypertension Mother    History   Social History  . Marital Status: Married    Spouse Name: N/A    Number of Children: N/A  . 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 Smoking status: Former Smoker    Quit date: 07/15/2002  . Smokeless tobacco: Never Used  . Alcohol Use: No  . Drug Use: No  . Sexually Active: Yes    Birth Control/ Protection: Post-menopausal   Other Topics Concern  . None   Social History Narrative  . None   Past Surgical History  Procedure Date  . Minimally-invasive excision of left atrial myxoma  12/24/2011    Dr Cornelius Moras   Past Medical History  Diagnosis Date  . Upper respiratory tract infection   . ICAO (internal carotid artery occlusion), left 12/15/2011  . 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 CVA (cerebral infarction) 12/15/2011  . Hypoglycemia 12/14/2011   BP 141/67  Pulse 87  Resp 16  Ht 5\' 4"  (1.626 m)  Wt 181 lb 3.2 oz (82.192 kg)  BMI 31.10 kg/m2  SpO2 90%    Review of Systems  Constitutional: Positive for unexpected weight change.       Loss of taste  Respiratory: Positive for shortness of breath.   Musculoskeletal: Positive for gait problem.  Neurological: Positive for weakness.  All other systems reviewed and are negative.       Objective:   Physical Exam  Constitutional: She is oriented to person, place, and time. She appears well-developed and well-nourished.  Eyes: Conjunctivae are normal. Pupils are equal, round, and reactive to light.       Left side inattention  Abdominal: There is no tenderness. There is no rebound.  Musculoskeletal:       Right shoulder: Normal.       Left shoulder: She exhibits no swelling and no effusion.       Left ankle: Normal. no tenderness.       Left foot: She exhibits tenderness.  Neurological: She is alert and oriented to person, place, and time. She displays no atrophy and no tremor. No sensory deficit. Gait abnormal.  Reflex Scores:      Tricep reflexes are 2+ on the right side and 3+ on the left side.      Bicep reflexes are 2+ on the right side and 3+ on the left side.      Brachioradialis reflexes are 2+ on the right side and 3+ on the left side.      Patellar reflexes are 2+ on the right side and 3+ on the left side.      Motor strength is 4/5 in the right upper and right lower extremity. Motor strength is 5/5 in the left upper extremity Motor strength is 5/5 in LLE Gait is with walker  Skin: Skin is warm and dry.  Psychiatric: Her affect is blunt.          Assessment & Plan:  1. Right hemiparesis status post  left MCA infarctwith excellent improvement in motor strength. Still has cognitive issues mainly slowing. I would continue the Ritalin but once a day for one month and then continue. I'll see her back in one month. Educated on post stroke seizures as a possible complication of CVA

## 2012-02-13 NOTE — Patient Instructions (Signed)
Therapy after a Stroke, Adult Your brain cells need a steady supply of blood and oxygen to work normally. A stroke comes from a sudden end of blood flow to the brain. Your brain cells may start to die within minutes after blood flow is interrupted. The loss of brain function can cause problems with speech, vision, memory, or movement. The severity of the stroke depends on which part of your brain is affected and how big an area is harmed. There are two main types of stroke:  Ischemic stroke is caused by a blockage in arteries (blood vessels carrying oxygen rich blood) supplying the brain. This is the most common kind of stroke.   Hemorrhagic stroke is occurs when a blood vessel in your brain bursts and causes bleeding into your brain.  A stroke can cause many types of problems. The treatment of stroke involves 3 stages. The 3 stages are prevention, treatment immediately following a stroke, and rehabilitation after a stroke. BEFORE THERAPY BEGINS A detailed exam by your caregiver helps outline what problems were caused by the stroke. Your caregiver may ask specialists about this. The specialists may include doctors, occupational therapists, physical therapists, and speech therapists. It is then possible to make a plan that best fits your needs. Your evaluation might include the following:  Your ability to do daily activities that require using muscles, coordination, vision, reasoning, memory, and problem solving. Interviews with you and your caregiver to understand what you could do and could not do before the stroke. Your ability to do personal self-care tasks, such as dressing, grooming, and eating.   Tests may be done to see if there are sensory and motor changes due to the stroke, especially in the hands and legs.  TREATMENT   Your caregiver may start therapy right away depending on the type and severity of your stroke.   Medicines.   If a blood clot caused the stroke, you may be started on  medicines that can help to dissolve the clot and stop new clots from forming.   If bleeding caused the stroke, medicines might be used to lower blood pressure and reverse the effects of any blood thinners used at the time of the stroke.   Therapy after stroke is focused on getting function back and preventing another stroke. Therapy might include:   Physical therapy. This can include help with walking, sitting, lying down, and balance. It may also be designed to help prevent shortening of the muscles (contractures) and swelling (edema).   Occupational therapy. This therapy helps you to relearn skills needed for leading a normal life. These could include eating, using the restroom, dressing, and taking care of yourself. It helps to make you more independent.   Vision therapy. This can help you to retrain, strengthen, and improve your vision following a stroke.   Speech therapy. This can help to improve your speech and communication skills. It also teaches both you and your family members to cope with problems of being unable to communicate.   Cognitive therapy. This therapy can help with problems caused by lack of memory, attention, or concentration.   Psychological or psychiatric therapy. This can help you cope with problems of frustration and emotional problems that may develop after a stroke.   Blood pressure control. If your blood pressure is high, it is important to lower it to a normal pressure.   Smoking cessation. If you smoke, it is important to get help to stop smoking.   Medicines to lower cholesterol (  antilipidemics) are sometimes prescribed to reduce the amount of fat (plaque) in the blood. The reduction of plaque on artery walls can decrease the risk of a future stroke.  HOME CARE INSTRUCTIONS   Follow a healthy diet as directed by your caregiver.   Monitor and control your blood sugar levels, if you are a person with diabetes.   Reduce the amount of sodium and fat in your  diet.   Quit smoking.   Exercise regularly, in moderation.   Take the necessary prescribed medicines as directed by your caregiver.   Continue your rehabilitation program as directed by your caregiver.   Get your blood pressure and cholesterol levels checked regularly.  SEEK MEDICAL CARE IF:   You feel you are having problems with medicines prescribed.   You are not improving as expected.  SEEK IMMEDIATE MEDICAL CARE IF:   You have sudden weakness or numbness of the face, arm, or leg, especially on one side of the body.   You have sudden confusion.   You have trouble speaking (aphasia) or understanding.   You have sudden trouble seeing in 1 or both eyes.   You have sudden trouble walking.   You have dizziness.   You have a loss of balance or coordination.   You have a sudden severe headache with no known cause.   You have an oral temperature above 102 F (38.9 C), not controlled by medicine.   You are coughing or have difficulty breathing.   You have new chest pain, angina, or an irregular heartbeat.  ANY OF THESE SYMPTOMS MAY REPRESENT A SERIOUS PROBLEM THAT IS AN EMERGENCY. Do not wait to see if the symptoms will go away. Get medical help at once. Call your local emergency services (911 in the [place] DO NOT drive yourself to the hospital. Document Released: 09/01/2007 Document Revised: 08/01/2011 Document Reviewed: 09/01/2007 [facility] Patient Information 2012 [place], [place].

## 2012-02-14 ENCOUNTER — Ambulatory Visit: Payer: BC Managed Care – PPO | Admitting: Rehabilitative and Restorative Service Providers"

## 2012-02-19 ENCOUNTER — Ambulatory Visit: Payer: BC Managed Care – PPO | Admitting: Occupational Therapy

## 2012-02-19 ENCOUNTER — Ambulatory Visit: Payer: BC Managed Care – PPO | Admitting: Physical Therapy

## 2012-02-20 ENCOUNTER — Ambulatory Visit: Payer: BC Managed Care – PPO | Admitting: Rehabilitative and Restorative Service Providers"

## 2012-02-20 ENCOUNTER — Ambulatory Visit: Payer: BC Managed Care – PPO | Admitting: Occupational Therapy

## 2012-02-25 ENCOUNTER — Ambulatory Visit: Payer: BC Managed Care – PPO | Admitting: Occupational Therapy

## 2012-02-25 ENCOUNTER — Ambulatory Visit
Payer: BC Managed Care – PPO | Attending: Physical Medicine & Rehabilitation | Admitting: Rehabilitative and Restorative Service Providers"

## 2012-02-25 ENCOUNTER — Ambulatory Visit: Payer: BC Managed Care – PPO

## 2012-02-25 DIAGNOSIS — I69998 Other sequelae following unspecified cerebrovascular disease: Secondary | ICD-10-CM | POA: Insufficient documentation

## 2012-02-25 DIAGNOSIS — I69920 Aphasia following unspecified cerebrovascular disease: Secondary | ICD-10-CM | POA: Insufficient documentation

## 2012-02-25 DIAGNOSIS — M6281 Muscle weakness (generalized): Secondary | ICD-10-CM | POA: Insufficient documentation

## 2012-02-25 DIAGNOSIS — R269 Unspecified abnormalities of gait and mobility: Secondary | ICD-10-CM | POA: Insufficient documentation

## 2012-02-25 DIAGNOSIS — Z5189 Encounter for other specified aftercare: Secondary | ICD-10-CM | POA: Insufficient documentation

## 2012-02-25 DIAGNOSIS — I69919 Unspecified symptoms and signs involving cognitive functions following unspecified cerebrovascular disease: Secondary | ICD-10-CM | POA: Insufficient documentation

## 2012-02-25 DIAGNOSIS — R279 Unspecified lack of coordination: Secondary | ICD-10-CM | POA: Insufficient documentation

## 2012-02-26 ENCOUNTER — Ambulatory Visit: Payer: BC Managed Care – PPO | Admitting: Rehabilitative and Restorative Service Providers"

## 2012-02-26 ENCOUNTER — Ambulatory Visit: Payer: BC Managed Care – PPO | Admitting: Speech Pathology

## 2012-03-02 ENCOUNTER — Ambulatory Visit: Payer: BC Managed Care – PPO | Admitting: Rehabilitative and Restorative Service Providers"

## 2012-03-02 ENCOUNTER — Ambulatory Visit: Payer: BC Managed Care – PPO

## 2012-03-02 ENCOUNTER — Ambulatory Visit: Payer: BC Managed Care – PPO | Admitting: Occupational Therapy

## 2012-03-04 ENCOUNTER — Ambulatory Visit: Payer: BC Managed Care – PPO | Admitting: Rehabilitative and Restorative Service Providers"

## 2012-03-04 ENCOUNTER — Ambulatory Visit: Payer: BC Managed Care – PPO

## 2012-03-04 ENCOUNTER — Ambulatory Visit: Payer: BC Managed Care – PPO | Admitting: Occupational Therapy

## 2012-03-09 ENCOUNTER — Encounter: Payer: BC Managed Care – PPO | Admitting: Speech Pathology

## 2012-03-09 ENCOUNTER — Encounter: Payer: BC Managed Care – PPO | Admitting: Occupational Therapy

## 2012-03-09 ENCOUNTER — Ambulatory Visit (INDEPENDENT_AMBULATORY_CARE_PROVIDER_SITE_OTHER): Payer: Self-pay | Admitting: Thoracic Surgery (Cardiothoracic Vascular Surgery)

## 2012-03-09 ENCOUNTER — Ambulatory Visit: Payer: BC Managed Care – PPO | Admitting: Rehabilitative and Restorative Service Providers"

## 2012-03-09 ENCOUNTER — Encounter: Payer: Self-pay | Admitting: Thoracic Surgery (Cardiothoracic Vascular Surgery)

## 2012-03-09 VITALS — BP 130/78 | HR 76 | Resp 20 | Ht 64.0 in | Wt 172.0 lb

## 2012-03-09 DIAGNOSIS — D151 Benign neoplasm of heart: Secondary | ICD-10-CM

## 2012-03-09 DIAGNOSIS — Z09 Encounter for follow-up examination after completed treatment for conditions other than malignant neoplasm: Secondary | ICD-10-CM | POA: Insufficient documentation

## 2012-03-09 NOTE — Progress Notes (Signed)
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 The patient presented acutely with left brain stroke do to acute occlusion of the left internal carotid artery. She was found to have left atrial myxoma during her subsequent workup following her stroke. Postoperatively the patient has done well from a cardiovascular standpoint.  She did have atrial fibrillation both pre-and postoperatively for which she was treated with amiodarone and anticoagulated with Coumadin.  She was discharged to inpatient rehabilitation service in early May. By the end of May she was discharged home where she has been convalescing further and continuing to make progress gradually with home health physical therapy. She returns to the office today for routine followup. She has been seen in followup previously by Dr. Katrinka Blazing who she also plans to see her again tomorrow.  Overall the patient reports doing fairly well. She ambulates using a walker but she remains frustrated that she hasn't been able to progress to the point where she can ambulate with only a cane or minimal assistance. She tires easily.  Her dysarthria has improved but remains a bit of a frustrating problem as well. She states that her appetite is only marginal and food doesn't taste right. She has some exertional shortness of breath which is stable. She has no pain. Remainder of her review of systems unremarkable.   Current Outpatient Prescriptions  Medication Sig Dispense Refill  . amiodarone (PACERONE) 200 MG tablet Take 1 tablet (200 mg total) by mouth daily.  30 tablet  1  . aspirin EC 81 MG EC tablet Take 1 tablet (81 mg total) by mouth daily.      .  methylphenidate (RITALIN) 5 MG tablet Take 1 tablet (5 mg total) by mouth every morning.  30 tablet  0  . metoprolol tartrate (LOPRESSOR) 25 MG tablet Take 1 tablet (25 mg total) by mouth 2 (two) times daily.  60 tablet  1  . warfarin (COUMADIN) 5 MG tablet Take 1 tablet (5 mg total) by mouth daily at 6 PM.  30 tablet  1      Physical Exam:   BP 130/78  Pulse 76  Resp 20  Ht 5\' 4"  (1.626 m)  Wt 172 lb (78.019 kg)  BMI 29.52 kg/m2  SpO2 91%  General:  Well-appearing  Chest:   Clear to auscultation with symmetrical breath sounds  CV:   Regular rate and rhythm without murmur  Incisions:  Right miniature thoracotomy scar is healed nicely  Abdomen:  Soft and nontender  Extremities:  Warm, well-perfused, and without lower extremity edema  Diagnostic Tests:  n/a   Impression:  Overall the patient is doing well more than 2-1/2 months status post right miniature thoracotomy for resection of left atrial myxoma. The patient has remained stable from a cardiovascular standpoint and apparently has been maintaining sinus rhythm. She has made gradual progress in terms her recovery from her previous stroke, although she still has significant limitation. She has not had any symptoms or signs of hypoglycemia which was present only very transiently at the  time of her initial presentation in April.   Plan:   I think it would be reasonable to consider discontinuing amiodarone in the near future as long as she continues to maintain sinus rhythm. He remains in sinus rhythm off amiodarone it would be reasonable to discontinue Coumadin as well.  At some point it would be reasonable to get a followup echocardiogram as well, and I discussed with the patient and her husband suggestion that it would be a good idea to get followup echocardiograms annually or perhaps every other year given her history of atrial myxoma. We will leave her long-term management followup to the discretion of Dr. Katrinka Blazing and colleagues.  In the future she will call and return to see [place] here at [facility] only should further problems or difficulties arise.   Salvatore Decent. Cornelius Moras, MD 03/09/2012 2:17 PM

## 2012-03-10 ENCOUNTER — Ambulatory Visit: Payer: BC Managed Care – PPO | Admitting: Physical Therapy

## 2012-03-10 ENCOUNTER — Encounter: Payer: BC Managed Care – PPO | Admitting: Occupational Therapy

## 2012-03-11 ENCOUNTER — Ambulatory Visit: Payer: BC Managed Care – PPO | Admitting: Rehabilitative and Restorative Service Providers"

## 2012-03-11 ENCOUNTER — Ambulatory Visit: Payer: BC Managed Care – PPO | Admitting: *Deleted

## 2012-03-13 ENCOUNTER — Ambulatory Visit: Payer: BC Managed Care – PPO | Admitting: Rehabilitative and Restorative Service Providers"

## 2012-03-16 ENCOUNTER — Ambulatory Visit: Payer: BC Managed Care – PPO | Admitting: Physical Medicine & Rehabilitation

## 2012-03-16 NOTE — Progress Notes (Deleted)
  Subjective:    Patient ID: Courtney Faulkner, female    DOB: [birth year], 61 y.o.   MRN: [id number]  HPI    Review of Systems     Objective:   Physical Exam        Assessment & Plan:

## 2012-03-16 NOTE — Progress Notes (Unsigned)
Erroneous encounter. Patient was not seen.

## 2012-03-17 ENCOUNTER — Ambulatory Visit: Payer: BC Managed Care – PPO | Admitting: Rehabilitative and Restorative Service Providers"

## 2012-03-17 ENCOUNTER — Ambulatory Visit: Payer: BC Managed Care – PPO

## 2012-03-17 ENCOUNTER — Ambulatory Visit: Payer: BC Managed Care – PPO | Admitting: Occupational Therapy

## 2012-03-19 ENCOUNTER — Ambulatory Visit: Payer: BC Managed Care – PPO

## 2012-03-19 ENCOUNTER — Ambulatory Visit: Payer: BC Managed Care – PPO | Admitting: Rehabilitative and Restorative Service Providers"

## 2012-03-19 ENCOUNTER — Ambulatory Visit: Payer: BC Managed Care – PPO | Admitting: Occupational Therapy

## 2012-03-26 ENCOUNTER — Ambulatory Visit: Payer: BC Managed Care – PPO | Admitting: Rehabilitative and Restorative Service Providers"

## 2012-04-01 ENCOUNTER — Ambulatory Visit: Payer: BC Managed Care – PPO | Admitting: Rehabilitative and Restorative Service Providers"

## 2012-04-03 ENCOUNTER — Ambulatory Visit: Payer: BC Managed Care – PPO | Admitting: Rehabilitative and Restorative Service Providers"

## 2012-04-07 ENCOUNTER — Ambulatory Visit: Payer: BC Managed Care – PPO | Admitting: Rehabilitative and Restorative Service Providers"

## 2012-04-10 ENCOUNTER — Ambulatory Visit: Payer: BC Managed Care – PPO | Admitting: Rehabilitative and Restorative Service Providers"

## 2012-06-16 ENCOUNTER — Other Ambulatory Visit (INDEPENDENT_AMBULATORY_CARE_PROVIDER_SITE_OTHER): Payer: BC Managed Care – PPO

## 2012-06-16 ENCOUNTER — Encounter: Payer: Self-pay | Admitting: Critical Care Medicine

## 2012-06-16 ENCOUNTER — Ambulatory Visit (INDEPENDENT_AMBULATORY_CARE_PROVIDER_SITE_OTHER): Payer: BC Managed Care – PPO | Admitting: Critical Care Medicine

## 2012-06-16 ENCOUNTER — Telehealth: Payer: Self-pay | Admitting: Critical Care Medicine

## 2012-06-16 ENCOUNTER — Telehealth: Payer: Self-pay | Admitting: *Deleted

## 2012-06-16 ENCOUNTER — Ambulatory Visit (INDEPENDENT_AMBULATORY_CARE_PROVIDER_SITE_OTHER)
Admission: RE | Admit: 2012-06-16 | Discharge: 2012-06-16 | Disposition: A | Discharge status: Home / Self Care | Payer: BC Managed Care – PPO | Source: Ambulatory Visit | Attending: Critical Care Medicine | Admitting: Critical Care Medicine

## 2012-06-16 VITALS — BP 130/82 | HR 86 | Temp 98.6°F | Ht 64.0 in | Wt 155.0 lb

## 2012-06-16 DIAGNOSIS — D860 Sarcoidosis of lung: Secondary | ICD-10-CM | POA: Insufficient documentation

## 2012-06-16 DIAGNOSIS — R0902 Hypoxemia: Secondary | ICD-10-CM | POA: Insufficient documentation

## 2012-06-16 DIAGNOSIS — J479 Bronchiectasis, uncomplicated: Secondary | ICD-10-CM

## 2012-06-16 DIAGNOSIS — J8410 Pulmonary fibrosis, unspecified: Secondary | ICD-10-CM

## 2012-06-16 LAB — BASIC METABOLIC PANEL
BUN: 9 mg/dL (ref 6–23)
Chloride: 102 mEq/L (ref 96–112)
GFR: 119.54 mL/min (ref >60.00)
Potassium: 3.4 mEq/L — ABNORMAL LOW (ref 3.5–5.1)
Sodium: 138 mEq/L (ref 135–145)

## 2012-06-16 MED ORDER — IOHEXOL 350 MG/ML SOLN
80.0000 mL | Freq: Once | INTRAVENOUS | Status: AC | PRN
  Administered 2012-06-16: 80 mL via INTRAVENOUS

## 2012-06-16 NOTE — Telephone Encounter (Signed)
fyi Dr Gwendolyn Grant lab called and stated that D dime was normal . Results was 0.30

## 2012-06-16 NOTE — Assessment & Plan Note (Signed)
Pulmonary fibrosis and basilar bronchiectasis ?from microaspiration.  Plan: Set up home oxygen REpeat CT Chest with PE angio contrast study Full Pulmonary function studies Labs for serology to eval for pulmonary fibrosis No medication changes until studies return

## 2012-06-16 NOTE — Assessment & Plan Note (Signed)
Bronchiectasis associated with pulmonary fibrosis See Pulm fibrosis assessment

## 2012-06-16 NOTE — Telephone Encounter (Signed)
i will call the pt with results in the AM

## 2012-06-16 NOTE — Progress Notes (Signed)
Subjective:    Patient ID: Courtney Faulkner, female    DOB: 12/12/1950, 61 y.o.   MRN: [id number]  HPI Comments: Dyspnea for several weeks.  Recent CVA: R sided  4/22- 5/24.  Prior issues with dyspnea, prior oxygen issues.   Shortness of Breath This is a chronic problem. The current episode started more than 1 month ago. The problem occurs daily (exertional only, 177ft, worse up hill). The problem has been gradually worsening. Duration: when stops moving will recover in . Pertinent negatives include no abdominal pain, chest pain, claudication, coryza, ear pain, fever, headaches, hemoptysis, leg pain, leg swelling, neck pain, orthopnea, PND, rash, rhinorrhea, sore throat, sputum production, swollen glands, syncope, vomiting or wheezing. The symptoms are aggravated by any activity and exercise. Risk factors include smoking. She has tried nothing for the symptoms. There is no history of allergies, aspirin allergies, asthma, bronchiolitis, chronic lung disease, COPD, DVT, a heart failure or PE. (Atrial myxoma resection 11/2011 with postop CVA and PAF RVR)   During hosp stay 4/13 CT Chest showed:  scarring/interstitial fibrosis in lung. Right upper lobe focal, bronchiectasis. Scattered regions of mild consolidation may represent scarring although difficult to exclude superimposed infection.  Pulm f/u was recommended but did not occur. Pt noted to desat on Cards f/u 10/16 and CXR abn.  PUlm consulted.  Past Medical History  Diagnosis Date  . ICAO (internal carotid artery occlusion), left 12/15/2011  . 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 CVA (cerebral infarction) 12/15/2011     Family History  Problem Relation Age of Onset  . Coronary artery disease Neg Hx   . Hypertension Mother      History   Social History  . Marital Status: Married    Spouse Name: N/A    Number of Children: 3  . 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 Smoking status: Former Smoker -- 1.0 packs/day for 20 years    Types: Cigarettes    Quit date: 08/26/2001  . Smokeless tobacco: Never Used  . Alcohol Use: No  . Drug Use: No  . Sexually Active: Yes    Birth Control/ Protection: Post-menopausal   Other Topics Concern  . Not on file   Social History Narrative  . No narrative on file     No Known Allergies   Outpatient Prescriptions Prior to Visit  Medication Sig Dispense Refill  . aspirin EC 81 MG EC tablet Take 1 tablet (81 mg total) by mouth daily.      . metoprolol tartrate (LOPRESSOR) 25 MG tablet Take 1 tablet (25 mg total) by mouth 2 (two) times daily.  60 tablet  1  . amiodarone (PACERONE) 200 MG tablet Take 1 tablet (200 mg total) by mouth daily.  30 tablet  1  . methylphenidate (RITALIN) 5 MG tablet Take 1 tablet (5 mg total) by mouth every morning.  30 tablet  0  . warfarin (COUMADIN) 5 MG tablet Take 1 tablet (5 mg total) by mouth daily at 6 PM.  30 tablet  1   No facility-administered medications prior to visit.      Review of Systems  Constitutional: Positive for activity change and unexpected weight change. Negative for fever, chills, diaphoresis, appetite change and fatigue.  HENT: Negative for hearing loss, ear pain, nosebleeds, congestion, sore throat, facial swelling, rhinorrhea, sneezing, mouth sores, trouble swallowing, neck pain, neck stiffness, dental problem, voice change, postnasal  drip, sinus pressure, tinnitus and ear discharge.   Eyes: Negative for photophobia, discharge, itching and visual disturbance.  Respiratory: Positive for cough and shortness of breath. Negative for apnea, hemoptysis, sputum production, choking, chest tightness, wheezing and stridor.   Cardiovascular: Negative for chest pain, palpitations, orthopnea, claudication, leg swelling, syncope and PND.  Gastrointestinal: Negative for nausea, vomiting, abdominal pain, constipation, blood in stool and abdominal distention.    Genitourinary: Negative for dysuria, urgency, frequency, hematuria, flank pain, decreased urine volume and difficulty urinating.  Musculoskeletal: Negative for myalgias, back pain, joint swelling, arthralgias and gait problem.  Skin: Negative for color change, pallor and rash.  Neurological: Positive for speech difficulty. Negative for dizziness, tremors, seizures, syncope, weakness, light-headedness, numbness and headaches.  Hematological: Negative for adenopathy. Does not bruise/bleed easily.  Psychiatric/Behavioral: Positive for disturbed wake/sleep cycle. Negative for confusion and agitation. The patient is not nervous/anxious.        Objective:   Physical Exam Filed Vitals:   06/16/12 0935 06/16/12 0941  BP:  130/82  Pulse:  86  Temp:  98.6 F (37 C)  TempSrc:  Oral  Height:  5\' 4"  (1.626 m)  Weight:  155 lb (70.308 kg)  SpO2: 86% 92%    Gen: Pleasant, well-nourished, in no distress,  normal affect  ENT: No lesions,  mouth clear,  oropharynx clear, no postnasal drip  Neck: No JVD, no TMG, no carotid bruits  Lungs: No use of accessory muscles, no dullness to percussion, clear without rales or rhonchi  Cardiovascular: RRR, heart sounds normal, no murmur or gallops, no peripheral edema  Abdomen: soft and NT, no HSM,  BS normal  Musculoskeletal: No deformities, no cyanosis or clubbing  Neuro: alert, non focal  Skin: Warm, no lesions or rashes    10/16 CXR reviewed.  Low lung volumes and basilar interstitial fibrosis. Desats to low 80% range in office on exertion 4/13 CT Chest : bibasilar scar and bronchiectasis  Ct Angio Chest Pe W/cm &/or Wo Cm  06/16/2012  *RADIOLOGY REPORT*  Clinical Data: Increased shortness of breath since hospitalization in April.  Rule out pulmonary embolism.  Evaluate pulmonary fibrosis.  CT ANGIOGRAPHY CHEST  Technique:  Multidetector CT imaging of the chest using the standard protocol during bolus administration of intravenous contrast.  Multiplanar reconstructed images including MIPs were obtained and reviewed to evaluate the vascular anatomy.  Contrast: 80mL OMNIPAQUE IOHEXOL 350 MG/ML SOLN  Comparison: Plain films of 06/10/2012.  Most recent CT of 12/17/2011.  Findings: Lung windows demonstrate motion degradation inferiorly. Mild to moderate in severity.  Right upper lobe central pulmonary fibrosis with traction bronchiectasis, unchanged.  Example image 19.  Otherwise peripheral and basilar predominant subpleural reticulation.  No definite honeycombing.  Mild traction bronchiectasis at the lung bases, including on images 56 and 49.  No lobar consolidation.  Soft tissue windows:  The quality of this exam for evaluation of pulmonary embolism is good.  The only limitation is the motion artifact at the lung bases. No evidence of pulmonary embolism.  Normal aortic caliber without dissection.  Mild cardiomegaly, without pericardial or pleural effusion.  Mildly enlarged pulmonary outflow tract, at 3.1 cm.  No mediastinal adenopathy.  Right hilar node is mildly prominent at 1.6 cm. Poorly evaluated on the prior unenhanced study.  Normal appearance of the esophagus.  Right cardiophrenic angle oval- shaped lesion measures 2.1 x 1.7 cm and low density on image 43. Not readily apparent on the prior unenhanced study.  Limited abdominal imaging demonstrates no significant  findings. No acute osseous abnormality.  A mid thoracic hemangioma.  IMPRESSION:  1. No evidence of pulmonary embolism.  Motion degradation inferiorly. 2.  Basilar predominant interstitial lung disease.  Favored to represent usual interstitial pneumonitis/pulmonary fibrosis. Concurrent mild centrilobular emphysema. 3.  Cardiomegaly; Pulmonary artery enlargement suggests pulmonary arterial hypertension. 4.  Right hilar adenopathy is favored to be reactive.  Follow-up chest CT of 3 months could confirm stability or resolution. 5.  Low density focus along the right side of the heart could  represent a pericardial cyst or an area of loculated pleural fluid.   Original Report Authenticated By: Consuello Bossier, M.D.        Assessment & Plan:   Postinflammatory pulmonary fibrosis Pulmonary fibrosis and basilar bronchiectasis ?from microaspiration.  Plan: Set up home oxygen REpeat CT Chest with PE angio contrast study Full Pulmonary function studies Labs for serology to eval for pulmonary fibrosis No medication changes until studies return    Bronchiectasis without acute exacerbation Bronchiectasis associated with pulmonary fibrosis See Pulm fibrosis assessment  Hypoxemia Exertional hypoxemia d/t pulmonary fibrosis See pulm fibrosis assessment    Updated Medication List Outpatient Encounter Prescriptions as of 06/16/2012  Medication Sig Dispense Refill  . aspirin EC 81 MG EC tablet Take 1 tablet (81 mg total) by mouth daily.      . metoprolol tartrate (LOPRESSOR) 25 MG tablet Take 1 tablet (25 mg total) by mouth 2 (two) times daily.  60 tablet  1  . DISCONTD: amiodarone (PACERONE) 200 MG tablet Take 1 tablet (200 mg total) by mouth daily.  30 tablet  1  . DISCONTD: methylphenidate (RITALIN) 5 MG tablet Take 1 tablet (5 mg total) by mouth every morning.  30 tablet  0  . DISCONTD: warfarin (COUMADIN) 5 MG tablet Take 1 tablet (5 mg total) by mouth daily at 6 PM.  30 tablet  1   No facility-administered encounter medications on file as of 06/16/2012.

## 2012-06-16 NOTE — Telephone Encounter (Signed)
Per [facility], CT is neg for PE but does have "some other stuff going on."  Dr. Delford Field is off this evening - per Okey Dupre, this CAN wait until tomorrow to be addressed.  Pt has already left.  Dr. Delford Field, pls advise.  Thank you.

## 2012-06-16 NOTE — Telephone Encounter (Signed)
Dr. Delford Field is it okay to double book? Please advise thanks

## 2012-06-16 NOTE — Telephone Encounter (Signed)
yes

## 2012-06-16 NOTE — Telephone Encounter (Signed)
I called pt and is scheduled to come in 06/30/12 at 9:30 per pt request. Noting further was needed

## 2012-06-16 NOTE — Patient Instructions (Addendum)
Labs today Pulmonary function studies will be scheduled CT scan of chest with contrast will be scheduled No change in medications for now Oxygen will be prescribed:  Use 2Liters at sleep and exertion. Ok to be off oxygen when at rest and awake Return 2 weeks to regroup.  I will call sooner with results of testing

## 2012-06-16 NOTE — Assessment & Plan Note (Signed)
Exertional hypoxemia d/t pulmonary fibrosis See pulm fibrosis assessment

## 2012-06-17 ENCOUNTER — Telehealth: Payer: Self-pay | Admitting: Critical Care Medicine

## 2012-06-17 DIAGNOSIS — J8410 Pulmonary fibrosis, unspecified: Secondary | ICD-10-CM

## 2012-06-17 LAB — ALLERGY FULL PROFILE
Allergen, D pternoyssinus,d7: <0.1 kU/L
Allergen,Goose feathers, e70: <0.1 kU/L
Alternaria Alternata: <0.1 kU/L
Aspergillus fumigatus, IgG: <0.1 kU/L
Bahia Grass: 7.61 kU/L — ABNORMAL HIGH
Box Elder IgE: 0.52 kU/L — ABNORMAL HIGH
Cat Dander: <0.1 kU/L
Common Ragweed: 0.25 kU/L — ABNORMAL HIGH
D. farinae: <0.1 kU/L
Dog Dander: <0.1 kU/L
G005 Rye, Perennial: 14 kU/L — ABNORMAL HIGH
Goldenrod: <0.1 kU/L
Helminthosporium halodes: 0.18 kU/L — ABNORMAL HIGH
House Dust Hollister: <0.1 kU/L
IgE (Immunoglobulin E), Serum: 165.5 IU/mL (ref 0.0–180.0)
Plantain: 0.13 kU/L — ABNORMAL HIGH
Stemphylium Botryosum: <0.1 kU/L
Sycamore Tree: <0.1 kU/L

## 2012-06-17 MED ORDER — PREDNISONE 10 MG PO TABS: ORAL_TABLET | ORAL | Status: DC

## 2012-06-17 NOTE — Telephone Encounter (Signed)
I gave pt results of CT Chest  Prednisone called in for IPF/UIP findings

## 2012-06-23 LAB — HYPERSENSITIVITY PNUEMONITIS PROFILE

## 2012-06-24 ENCOUNTER — Ambulatory Visit (INDEPENDENT_AMBULATORY_CARE_PROVIDER_SITE_OTHER): Payer: BC Managed Care – PPO | Admitting: Critical Care Medicine

## 2012-06-24 DIAGNOSIS — J479 Bronchiectasis, uncomplicated: Secondary | ICD-10-CM

## 2012-06-24 DIAGNOSIS — J8410 Pulmonary fibrosis, unspecified: Secondary | ICD-10-CM

## 2012-06-24 LAB — PULMONARY FUNCTION TEST

## 2012-06-24 NOTE — Progress Notes (Signed)
PFT done today. 

## 2012-06-25 ENCOUNTER — Telehealth: Payer: Self-pay | Admitting: Critical Care Medicine

## 2012-06-25 ENCOUNTER — Encounter: Payer: Self-pay | Admitting: Critical Care Medicine

## 2012-06-25 DIAGNOSIS — J8410 Pulmonary fibrosis, unspecified: Secondary | ICD-10-CM

## 2012-06-25 NOTE — Telephone Encounter (Signed)
Called, spoke with pt.  Informed her of results.  Pt has a pending OV with Dr. Delford Field on Tuesday, Nov 5 in [place] to discuss results and regroup.  Pt aware of appt.

## 2012-06-25 NOTE — Progress Notes (Signed)
Quick Note:  Called, spoke with pt. Informed her of results per Dr. Wright. She verbalized understanding. ______ 

## 2012-06-25 NOTE — Telephone Encounter (Signed)
Call pt with PFT results, they show severe restriction and low DLCO due to fibrosis   Please schedule an appt asap so we can review results and regroup  pls put PFT results in EMR

## 2012-06-30 ENCOUNTER — Encounter: Payer: Self-pay | Admitting: Critical Care Medicine

## 2012-06-30 ENCOUNTER — Ambulatory Visit (INDEPENDENT_AMBULATORY_CARE_PROVIDER_SITE_OTHER): Payer: BC Managed Care – PPO | Admitting: Critical Care Medicine

## 2012-06-30 VITALS — BP 118/78 | HR 69 | Temp 97.5°F | Ht 63.0 in | Wt 160.6 lb

## 2012-06-30 DIAGNOSIS — Z23 Encounter for immunization: Secondary | ICD-10-CM

## 2012-06-30 DIAGNOSIS — J8410 Pulmonary fibrosis, unspecified: Secondary | ICD-10-CM

## 2012-06-30 MED ORDER — PREDNISONE 10 MG PO TABS: ORAL_TABLET | ORAL | Status: DC

## 2012-06-30 NOTE — Assessment & Plan Note (Signed)
Pulmonary  fibrosis with basilar bronchiectasis from prior microaspiration and associated elevated IgE levels consistent with significant hypersensitivity pneumonitis component Steroid dependent with oxygen dependency and chronic respiratory failure Plan Stay on oxygen  2Liter rest 3 Liter exertion Reduce prednisone to 15mg  daily and stay FLu vaccine and pneumovax were given Return 2 months

## 2012-06-30 NOTE — Patient Instructions (Addendum)
Stay on oxygen  2Liter rest 3 Liter exertion Reduce prednisone to 15mg  daily and stay FLu vaccine and pneumovax were given Return 2 months

## 2012-06-30 NOTE — Progress Notes (Signed)
Subjective:    Patient ID: Courtney Faulkner, female    DOB: 1951/01/28, 61 y.o.   MRN: [id number]  HPI  06/30/2012  Breathing is now better.  Less edema is noted. Less cough.  No wheezes.   No real chest pains.   The patient is now wearing oxygen and this is improved the patient's status. Patient also feels a prednisone has been beneficial. Pt denies any significant sore throat, nasal congestion or excess secretions, fever, chills, sweats, unintended weight loss, pleurtic or exertional chest pain, orthopnea PND, or leg swelling Pt denies any increase in rescue therapy over baseline, denies waking up needing it or having any early am or nocturnal exacerbations of coughing/wheezing/or dyspnea. Pt also denies any obvious fluctuation in symptoms with  weather or environmental change or other alleviating or aggravating factors      Past Medical History  Diagnosis Date  . ICAO (internal carotid artery occlusion), left 12/15/2011  . 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 CVA (cerebral infarction) 12/15/2011     Family History  Problem Relation Age of Onset  . Coronary artery disease Neg Hx   . Hypertension Mother      History   Social History  . Marital Status: Married    Spouse Name: N/A    Number of Children: 3  . 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 Smoking status: Former Smoker -- 1.0 packs/day for 20 years    Types: Cigarettes    Quit date: 08/26/2001  . Smokeless tobacco: Never Used  . Alcohol Use: No  . Drug Use: No  . Sexually Active: Yes    Birth Control/ Protection: Post-menopausal   Other Topics Concern  . Not on file   Social History Narrative  . No narrative on file     No Known Allergies   Outpatient Prescriptions Prior to Visit  Medication Sig Dispense Refill  . aspirin EC 81 MG EC tablet Take 1 tablet (81 mg total) by mouth daily.      . metoprolol tartrate (LOPRESSOR) 25 MG  tablet Take 1 tablet (25 mg total) by mouth 2 (two) times daily.  60 tablet  1  . predniSONE (DELTASONE) 10 MG tablet Take 4 for four days 3 for four days then 2 daily  60 tablet  6      Review of Systems  Constitutional: Positive for activity change and unexpected weight change. Negative for chills, diaphoresis, appetite change and fatigue.  HENT: Negative for hearing loss, nosebleeds, congestion, facial swelling, sneezing, mouth sores, trouble swallowing, neck stiffness, dental problem, voice change, postnasal drip, sinus pressure, tinnitus and ear discharge.   Eyes: Negative for photophobia, discharge, itching and visual disturbance.  Respiratory: Positive for cough. Negative for apnea, choking, chest tightness and stridor.   Cardiovascular: Negative for palpitations.  Gastrointestinal: Negative for nausea, constipation, blood in stool and abdominal distention.  Genitourinary: Negative for dysuria, urgency, frequency, hematuria, flank pain, decreased urine volume and difficulty urinating.  Musculoskeletal: Negative for myalgias, back pain, joint swelling, arthralgias and gait problem.  Skin: Negative for color change and pallor.  Neurological: Positive for speech difficulty. Negative for dizziness, tremors, seizures, syncope, weakness, light-headedness and numbness.  Hematological: Negative for adenopathy. Does not bruise/bleed easily.  Psychiatric/Behavioral: Positive for sleep disturbance. Negative for confusion and agitation. The patient is not nervous/anxious.        Objective:   Physical Exam  Filed Vitals:  06/30/12 0944  BP: 118/78  Pulse: 69  Temp: 97.5 F (36.4 C)  TempSrc: Oral  Height: 5\' 3"  (1.6 m)  Weight: 160 lb 9.6 oz (72.848 kg)  SpO2: 99%    Gen: Pleasant, well-nourished, in no distress,  normal affect  ENT: No lesions,  mouth clear,  oropharynx clear, no postnasal drip  Neck: No JVD, no TMG, no carotid bruits  Lungs: No use of accessory muscles, no  dullness to percussion, dry rales with poor airflow  Cardiovascular: RRR, heart sounds normal, no murmur or gallops, no peripheral edema  Abdomen: soft and NT, no HSM,  BS normal  Musculoskeletal: No deformities, no cyanosis or clubbing  Neuro: alert, non focal  Skin: Warm, no lesions or rashes  Significant desaturation on room air to the low 80% range after one lap around the office 06/30/2012   PFT Conversion 06/25/2012  FVC 1.08  FVC PREDICT 3.07  FVC  % Predicted 35  FEV1 0.89  FEV1 PREDICT 2.26  FEV % Predicted 39  FEV1/FVC 82.4  FEV1/FVC PRE 73  FeF 25-75 0.91  FeF 25-75 % Predicted 35  Residual Volume (ml) 0.91  RV % EXPECT 49  DLCO (ml/mmHg sec) 5.1  DLCO % EXPEC 24  DLCO/VA 3.18  DLCO/VA%EXP 84   No results found.     Assessment & Plan:   Postinflammatory pulmonary fibrosis Pulmonary  fibrosis with basilar bronchiectasis from prior microaspiration and associated elevated IgE levels consistent with significant hypersensitivity pneumonitis component Steroid dependent with oxygen dependency and chronic respiratory failure Plan Stay on oxygen  2Liter rest 3 Liter exertion Reduce prednisone to 15mg  daily and stay FLu vaccine and pneumovax were given Return 2 months     Updated Medication List Outpatient Encounter Prescriptions as of 06/30/2012  Medication Sig Dispense Refill  . aspirin EC 81 MG EC tablet Take 1 tablet (81 mg total) by mouth daily.      . metoprolol tartrate (LOPRESSOR) 25 MG tablet Take 1 tablet (25 mg total) by mouth 2 (two) times daily.  60 tablet  1  . predniSONE (DELTASONE) 10 MG tablet Reduce to 15mg  daily and stay      . [DISCONTINUED] predniSONE (DELTASONE) 10 MG tablet Take 4 for four days 3 for four days then 2 daily  60 tablet  6  . [DISCONTINUED] predniSONE (DELTASONE) 10 MG tablet Take 20 mg by mouth daily.

## 2012-09-09 ENCOUNTER — Encounter: Payer: Self-pay | Admitting: Critical Care Medicine

## 2012-09-09 ENCOUNTER — Ambulatory Visit (INDEPENDENT_AMBULATORY_CARE_PROVIDER_SITE_OTHER): Payer: BC Managed Care – PPO | Admitting: Critical Care Medicine

## 2012-09-09 VITALS — BP 110/72 | HR 68 | Temp 98.1°F | Ht 64.0 in | Wt 150.0 lb

## 2012-09-09 DIAGNOSIS — J8410 Pulmonary fibrosis, unspecified: Secondary | ICD-10-CM

## 2012-09-09 NOTE — Progress Notes (Signed)
Subjective:    Patient ID: Courtney Faulkner, female    DOB: [birth year], 62 y.o.   MRN: [id number]  HPI   09/09/2012 pulm fibrosis f/u. Now on oxygen and pred and has helped.  No real cough. No chest pain. No edema in feet.  Hands will turn blue. Worse if exposed to cold air    Past Medical History  Diagnosis Date  . ICAO (internal carotid artery occlusion), left 12/15/2011  . 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 CVA (cerebral infarction) 12/15/2011     Family History  Problem Relation Age of Onset  . Coronary artery disease Neg Hx   . Hypertension Mother      History   Social History  . Marital Status: Married    Spouse Name: N/A    Number of Children: 3  . 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 Smoking status: Former Smoker -- 1.0 packs/day for 20 years    Types: Cigarettes    Quit date: 08/26/2001  . Smokeless tobacco: Never Used  . Alcohol Use: No  . Drug Use: No  . Sexually Active: Yes    Birth Control/ Protection: Post-menopausal   Other Topics Concern  . Not on file   Social History Narrative  . No narrative on file     No Known Allergies   Outpatient Prescriptions Prior to Visit  Medication Sig Dispense Refill  . aspirin EC 81 MG EC tablet Take 1 tablet (81 mg total) by mouth daily.      . metoprolol tartrate (LOPRESSOR) 25 MG tablet Take 1 tablet (25 mg total) by mouth 2 (two) times daily.  60 tablet  1  . predniSONE (DELTASONE) 10 MG tablet Reduce to 15mg  daily and stay      Last reviewed on 09/09/2012 10:34 AM by Storm Frisk, MD    Review of Systems  Constitutional: Positive for activity change and unexpected weight change. Negative for chills, diaphoresis, appetite change and fatigue.  HENT: Negative for hearing loss, nosebleeds, congestion, facial swelling, sneezing, mouth sores, trouble swallowing, neck stiffness, dental problem, voice change, postnasal drip, sinus  pressure, tinnitus and ear discharge.   Eyes: Negative for photophobia, discharge, itching and visual disturbance.  Respiratory: Positive for cough. Negative for apnea, choking, chest tightness and stridor.   Cardiovascular: Negative for palpitations.  Gastrointestinal: Negative for nausea, constipation, blood in stool and abdominal distention.  Genitourinary: Negative for dysuria, urgency, frequency, hematuria, flank pain, decreased urine volume and difficulty urinating.  Musculoskeletal: Negative for myalgias, back pain, joint swelling, arthralgias and gait problem.  Skin: Negative for color change and pallor.  Neurological: Positive for speech difficulty. Negative for dizziness, tremors, seizures, syncope, weakness, light-headedness and numbness.  Hematological: Negative for adenopathy. Does not bruise/bleed easily.  Psychiatric/Behavioral: Positive for sleep disturbance. Negative for confusion and agitation. The patient is not nervous/anxious.        Objective:   Physical Exam  Filed Vitals:   09/09/12 1008  BP: 110/72  Pulse: 68  Temp: 98.1 F (36.7 C)  TempSrc: Oral  Height: 5\' 4"  (1.626 m)  Weight: 150 lb (68.04 kg)  SpO2: 98%    Gen: Pleasant, well-nourished, in no distress,  normal affect  ENT: No lesions,  mouth clear,  oropharynx clear, no postnasal drip  Neck: No JVD, no TMG, no carotid bruits  Lungs: No use of accessory muscles, no dullness to percussion, dry rales  with fair airflow  Cardiovascular: RRR, heart sounds normal, no murmur or gallops, no peripheral edema  Abdomen: soft and NT, no HSM,  BS normal  Musculoskeletal: No deformities, no cyanosis or clubbing  Neuro: alert, non focal  Skin: Warm, no lesions or rashes  Significant desaturation on room air to the low 80% range after one lap around the office 06/30/2012   PFT Conversion 06/25/2012  FVC 1.08  FVC PREDICT 3.07  FVC  % Predicted 35  FEV1 0.89  FEV1 PREDICT 2.26  FEV % Predicted 39    FEV1/FVC 82.4  FEV1/FVC PRE 73  FeF 25-75 0.91  FeF 25-75 % Predicted 35  Residual Volume (ml) 0.91  RV % EXPECT 49  DLCO (ml/mmHg sec) 5.1  DLCO % EXPEC 24  DLCO/VA 3.18  DLCO/VA%EXP 84   No results found.     Assessment & Plan:   Postinflammatory pulmonary fibrosis Pulmonary fibrosis on the basis of pre-existing microaspiration and associated basilar bronchiectasis. Stable at this time in improved on corticosteroids Plan Maintain oxygen as prescribed Maintain prednisone 15 mg daily    Updated Medication List Outpatient Encounter Prescriptions as of 09/09/2012  Medication Sig Dispense Refill  . aspirin EC 81 MG EC tablet Take 1 tablet (81 mg total) by mouth daily.      . metoprolol tartrate (LOPRESSOR) 25 MG tablet Take 1 tablet (25 mg total) by mouth 2 (two) times daily.  60 tablet  1  . predniSONE (DELTASONE) 10 MG tablet Reduce to 15mg  daily and stay

## 2012-09-09 NOTE — Assessment & Plan Note (Signed)
Pulmonary fibrosis on the basis of pre-existing microaspiration and associated basilar bronchiectasis. Stable at this time in improved on corticosteroids Plan Maintain oxygen as prescribed Maintain prednisone 15 mg daily

## 2012-09-09 NOTE — Patient Instructions (Addendum)
Stay on oxygen at night and exertion, may be off oxygen at rest during the day for periods of time Stay on prednisone 15mg  daily Return 4 months

## 2012-10-20 ENCOUNTER — Telehealth: Payer: Self-pay | Admitting: Critical Care Medicine

## 2012-10-20 DIAGNOSIS — J8410 Pulmonary fibrosis, unspecified: Secondary | ICD-10-CM

## 2012-10-20 NOTE — Telephone Encounter (Signed)
I spoke with pt and she stated [facility] advised her she should get a tank that holds more oxygen but is smaller in size. Pt was not sure what it was called. Need to call [facility] in the AM to see if they know what she is talking about. Pt aware we are working on this

## 2012-10-21 NOTE — Telephone Encounter (Signed)
Called, spoke with Larita Fife with [facility].  States they will need order for an o2 titration on a portable o2 concentrator.  Dr. Delford Field, are you ok with this order?

## 2012-10-21 NOTE — Telephone Encounter (Signed)
Order was sent to [facility] Spoke with the pt and notified that this was done She verbalized understanding and states nothing further needed

## 2012-10-21 NOTE — Telephone Encounter (Signed)
Yes I am ok with this order

## 2012-12-16 ENCOUNTER — Ambulatory Visit: Payer: Self-pay | Admitting: Neurology

## 2012-12-16 ENCOUNTER — Ambulatory Visit (INDEPENDENT_AMBULATORY_CARE_PROVIDER_SITE_OTHER): Payer: Self-pay | Admitting: Neurology

## 2012-12-16 ENCOUNTER — Encounter: Payer: Self-pay | Admitting: Neurology

## 2012-12-16 DIAGNOSIS — I6529 Occlusion and stenosis of unspecified carotid artery: Secondary | ICD-10-CM

## 2012-12-16 NOTE — Patient Instructions (Signed)
Continue aspirin for stroke prevention and check followup carotid and transcranial Doppler studies. Return for followup in 6 months with [facility] nurse practitioner

## 2012-12-22 ENCOUNTER — Telehealth: Payer: Self-pay | Admitting: *Deleted

## 2012-12-22 NOTE — Progress Notes (Signed)
[facility] [address] [place]. [place] [address] [phone]       OFFICE FOLLOW-UP NOTE  Ms. Courtney Faulkner Date of Birth:  [birth year] Medical Record Number:  [id number]   HPI:61 year African American female with history of left MCA infarct from left carotid occlusion secondary to left atrial myxoma. In April 2013 History of atrial fibrillation status post surgical ablation and remote smoking quit in 2003 She returns for followup of her last visit on 06/17/12. She has been started on home oxygen and Prednisone for her shortness of breath by Dr. Delford Field. She has not had any recurrent stroke or TIA symptoms since April 2013. She remains on warfarin and is tolerating it well without bleeding of the side effects. She continues to have mild right hand weakness as well as drags her right leg when walking. She has had no recent falls. She states her blood pressure has been under good control.     ROS:   14 system review of systems is positive for shortness of breath, dyspnea, and cough, and difficulty walking, weakness.  PMH:  Past Medical History  Diagnosis Date  . ICAO (internal carotid artery occlusion), left 12/15/2011  . 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 CVA (cerebral infarction) 12/15/2011    Social History:  History   Social History  . Marital Status: Married    Spouse Name: N/A    Number of Children: 3  . 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 Smoking status: Former Smoker -- 1.00 packs/day for 20 years    Types: Cigarettes    Quit date: 08/26/2001  . Smokeless tobacco: Never Used  . Alcohol Use: No  . Drug Use: No  . Sexually Active: Yes    Birth Control/ Protection: Post-menopausal   Other Topics Concern  . Not on file   Social History Narrative   Pt lives at home with her family.   Caffeine Use- quit intake 11/2011    Medications:   Current Outpatient  Prescriptions on File Prior to Visit  Medication Sig Dispense Refill  . aspirin EC 81 MG EC tablet Take 1 tablet (81 mg total) by mouth daily.      . metoprolol tartrate (LOPRESSOR) 25 MG tablet Take 1 tablet (25 mg total) by mouth 2 (two) times daily.  60 tablet  1  . predniSONE (DELTASONE) 10 MG tablet Reduce to 15mg  daily and stay       No current facility-administered medications on file prior to visit.    Allergies:  No Known Allergies Filed Vitals:   12/16/12 1451  BP: 102/70  Pulse: 101  Temp: 98.4 F (36.9 C)   Physical Exam General: well developed, well nourished, seated, in no evident distress Head: head normocephalic and atraumatic. Orohparynx benign Neck: supple with no carotid or supraclavicular bruits Cardiovascular: regular rate and rhythm, no murmurs Musculoskeletal: no deformity Skin:  no rash/petichiae Vascular:  Normal pulses all extremities  Neurologic Exam Mental Status: Awake and fully alert. Oriented to place and time. Recent and remote memory intact. Attention span, concentration and fund of knowledge appropriate. Mood and affect appropriate.  Cranial Nerves: Fundoscopic exam reveals sharp disc margins. Pupils equal, briskly reactive to light. Extraocular movements full without nystagmus. Visual fields full to confrontation. Hearing intact. Facial sensation intact. Face, tongue, palate moves normally and symmetrically.  Motor: Normal bulk and tone. Normal strength in all tested extremity  muscles.   mild right facial droop as well as diminished fine motor skills on the right and orbits left over right upper extremity. Mild weakness of ankle dorsiflexors and right grip Sensory.: intact to tough and pinprick and vibratory.  Coordination: Rapid alternating movements normal in all extremities. Finger-to-nose and heel-to-shin performed accurately bilaterally. Gait and Station: Arises from chair without difficulty. Stance is normal. Gait demonstrates normal stride  length and balance . Able to heel, toe and tandem walk without difficulty.  Reflexes: 1+ and symmetric. Toes downgoing.     ASSESSMENT: 37 year African American female with history of left MCA infarct from left carotid occlusion secondary to left atrial myxoma. In April 2013 History of atrial fibrillation status post surgical ablation and remote smoking quit in 2003    PLAN:  Continue Aspirin for stroke prevention and strict control of hypertension with blrood pressure goal below 130/90. Check followup carotid ultrasound study and transcranial doppler studies.. Return for followup in 6 months with [facility] practitioner.

## 2012-12-24 ENCOUNTER — Ambulatory Visit (INDEPENDENT_AMBULATORY_CARE_PROVIDER_SITE_OTHER): Payer: BC Managed Care – PPO

## 2012-12-24 DIAGNOSIS — I6529 Occlusion and stenosis of unspecified carotid artery: Secondary | ICD-10-CM

## 2012-12-24 DIAGNOSIS — Z0289 Encounter for other administrative examinations: Secondary | ICD-10-CM

## 2013-01-14 ENCOUNTER — Telehealth: Payer: Self-pay | Admitting: *Deleted

## 2013-01-14 NOTE — Telephone Encounter (Signed)
-  Husband, Casimiro Needle called back asking about doppler results.  He requested call back about these results (specifically L ICA blockage).  [phone]

## 2013-01-14 NOTE — Telephone Encounter (Signed)
I called and LMVM for pt regarding doppler study results.  Expected results from L ICA occlusion.  She is to call back with questions.

## 2013-01-15 ENCOUNTER — Ambulatory Visit (INDEPENDENT_AMBULATORY_CARE_PROVIDER_SITE_OTHER): Payer: BC Managed Care – PPO | Admitting: Critical Care Medicine

## 2013-01-15 ENCOUNTER — Encounter: Payer: Self-pay | Admitting: Critical Care Medicine

## 2013-01-15 VITALS — BP 92/66 | HR 70 | Temp 98.6°F | Ht 64.0 in | Wt 145.0 lb

## 2013-01-15 DIAGNOSIS — J8410 Pulmonary fibrosis, unspecified: Secondary | ICD-10-CM

## 2013-01-15 NOTE — Patient Instructions (Addendum)
Stay on prednisone 15mg  daily A referral to  Pulmonary rehab will be made Repeat pulmonary functions will be made to see if lung function is better to go into EAP trial to get Pirfenidone early.   Your lung function is too severe for the trial at this time Wear oxygen at 2 liters at rest and 4 liters pulsed with exertion Return 3  months

## 2013-01-15 NOTE — Progress Notes (Signed)
Subjective:    Patient ID: Courtney Faulkner, female    DOB: February 12, 1951, 62 y.o.   MRN: [id number]  HPI  01/15/2013 Dyspnea is better. No real cough.  No chest pain.  No dysphagia.  No qhs dyspnea.  Ex smoker 1ppd x 36yrs No edema in feet.     Past Medical History  Diagnosis Date  . ICAO (internal carotid artery occlusion), left 12/15/2011  . 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 CVA (cerebral infarction) 12/15/2011  . Postinflammatory pulmonary fibrosis      Family History  Problem Relation Age of Onset  . Coronary artery disease Neg Hx   . Hypertension Mother   . COPD Father   . Diabetes       History   Social History  . Marital Status: Married    Spouse Name: N/A    Number of Children: 3  . 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 Smoking status: Former Smoker -- 1.00 packs/day for 20 years    Types: Cigarettes    Quit date: 08/26/2001  . Smokeless tobacco: Never Used  . Alcohol Use: No  . Drug Use: No  . Sexually Active: Yes    Birth Control/ Protection: Post-menopausal   Other Topics Concern  . Not on file   Social History Narrative   Pt lives at home with her family.   Caffeine Use- quit intake 11/2011     No Known Allergies   Outpatient Prescriptions Prior to Visit  Medication Sig Dispense Refill  . metoprolol tartrate (LOPRESSOR) 25 MG tablet Take 1 tablet (25 mg total) by mouth 2 (two) times daily.  60 tablet  1  . predniSONE (DELTASONE) 10 MG tablet Reduce to 15mg  daily and stay       No facility-administered medications prior to visit.      Review of Systems  Constitutional: Positive for activity change and unexpected weight change. Negative for chills, diaphoresis, appetite change and fatigue.  HENT: Negative for hearing loss, nosebleeds, congestion, facial swelling, sneezing, mouth sores, trouble swallowing, neck stiffness, dental problem, voice change, postnasal drip,  sinus pressure, tinnitus and ear discharge.   Eyes: Negative for photophobia, discharge, itching and visual disturbance.  Respiratory: Positive for cough. Negative for apnea, choking, chest tightness and stridor.   Cardiovascular: Negative for palpitations.  Gastrointestinal: Negative for nausea, constipation, blood in stool and abdominal distention.  Genitourinary: Negative for dysuria, urgency, frequency, hematuria, flank pain, decreased urine volume and difficulty urinating.  Musculoskeletal: Negative for myalgias, back pain, joint swelling, arthralgias and gait problem.  Skin: Negative for color change and pallor.  Neurological: Positive for speech difficulty. Negative for dizziness, tremors, seizures, syncope, weakness, light-headedness and numbness.  Hematological: Negative for adenopathy. Does not bruise/bleed easily.  Psychiatric/Behavioral: Positive for sleep disturbance. Negative for confusion and agitation. The patient is not nervous/anxious.        Objective:   Physical Exam  Filed Vitals:   01/15/13 1106  BP: 92/66  Pulse: 70  Temp: 98.6 F (37 C)  TempSrc: Oral  Height: 5\' 4"  (1.626 m)  Weight: 65.772 kg (145 lb)  SpO2: 99%    Gen: Pleasant, well-nourished, in no distress,  normal affect  ENT: No lesions,  mouth clear,  oropharynx clear, no postnasal drip  Neck: No JVD, no TMG, no carotid bruits  Lungs: No use of accessory muscles, no dullness to percussion, dry rales with fair airflow  Cardiovascular: RRR, heart sounds normal, no murmur or gallops, no peripheral edema  Abdomen: soft and NT, no HSM,  BS normal  Musculoskeletal: No deformities, no cyanosis or clubbing  Neuro: alert, non focal  Skin: Warm, no lesions or rashes       Assessment & Plan:   Postinflammatory pulmonary fibrosis Bronchiectasis from microaspiration stable at this time Plan Stay on prednisone 15mg  daily A referral to  Pulmonary rehab will be made Repeat pulmonary functions  will be made to see if lung function is better to go into EAP trial to get Pirfenidone early.   Your lung function is too severe for the trial at this time Wear oxygen at 2 liters at rest and 4 liters pulsed with exertion Return 3  months      Updated Medication List Outpatient Encounter Prescriptions as of 01/15/2013  Medication Sig Dispense Refill  . aspirin 81 MG tablet Take 81 mg by mouth daily.      . metoprolol tartrate (LOPRESSOR) 25 MG tablet Take 1 tablet (25 mg total) by mouth 2 (two) times daily.  60 tablet  1  . predniSONE (DELTASONE) 10 MG tablet Reduce to 15mg  daily and stay       No facility-administered encounter medications on file as of 01/15/2013.

## 2013-01-16 ENCOUNTER — Encounter: Payer: Self-pay | Admitting: Critical Care Medicine

## 2013-01-16 NOTE — Assessment & Plan Note (Signed)
Bronchiectasis from microaspiration stable at this time Plan Stay on prednisone 15mg  daily A referral to  Pulmonary rehab will be made Repeat pulmonary functions will be made to see if lung function is better to go into EAP trial to get Pirfenidone early.   Your lung function is too severe for the trial at this time Wear oxygen at 2 liters at rest and 4 liters pulsed with exertion Return 3  months

## 2013-01-20 NOTE — Telephone Encounter (Signed)
I called husband's cell phone number and left a message to call me back if needed.

## 2013-01-22 ENCOUNTER — Telehealth: Payer: Self-pay | Admitting: Critical Care Medicine

## 2013-01-22 DIAGNOSIS — J8410 Pulmonary fibrosis, unspecified: Secondary | ICD-10-CM

## 2013-01-22 NOTE — Telephone Encounter (Signed)
Pt calling for pulm rehab appt I advised that the referral has been made and someone from [facility] will contact her  She verbalized understanding and states nothing further needed

## 2013-02-15 NOTE — Telephone Encounter (Signed)
done

## 2013-02-16 ENCOUNTER — Ambulatory Visit (INDEPENDENT_AMBULATORY_CARE_PROVIDER_SITE_OTHER): Payer: BC Managed Care – PPO | Admitting: Critical Care Medicine

## 2013-02-16 DIAGNOSIS — J8410 Pulmonary fibrosis, unspecified: Secondary | ICD-10-CM

## 2013-02-16 NOTE — Progress Notes (Signed)
PFT done today. Jennifer Castillo, CMA  

## 2013-02-23 ENCOUNTER — Telehealth: Payer: Self-pay | Admitting: Critical Care Medicine

## 2013-02-23 ENCOUNTER — Encounter: Payer: Self-pay | Admitting: Critical Care Medicine

## 2013-02-23 NOTE — Telephone Encounter (Signed)
Call the pt and tell her PFTs show improvement from 05/2012.  I will see if she will now qualify for pirfenidone trial  Also pls put pfts into emr

## 2013-02-24 ENCOUNTER — Encounter: Payer: Self-pay | Admitting: Critical Care Medicine

## 2013-02-24 NOTE — Telephone Encounter (Signed)
Called, spoke with pt.  Informed her of PFT results and recs per Dr. Delford Field.  She verbalized understanding of this and voiced no further questions or concerns at this time.  Will put PFTs in.

## 2013-03-04 ENCOUNTER — Telehealth: Payer: Self-pay | Admitting: Critical Care Medicine

## 2013-03-04 NOTE — Telephone Encounter (Signed)
I spoke with the pt and advised I will send an email to the research nurse to make sure she has her on the list to call. I advised it is taking them a few weeks to get to everyone. Pt states understanding. Carron Curie, CMA

## 2013-03-11 ENCOUNTER — Other Ambulatory Visit: Payer: Self-pay | Admitting: Critical Care Medicine

## 2013-03-12 MED ORDER — PREDNISONE 10 MG PO TABS: ORAL_TABLET | ORAL | Status: DC

## 2013-03-12 NOTE — Telephone Encounter (Signed)
Refill request is for a pred taper.  Per last OV note with PW on 01/15/13:  Patient Instructions    Stay on prednisone 15mg  daily  A referral to Pulmonary rehab will be made  Repeat pulmonary functions will be made to see if lung function is better to go into EAP trial to get Pirfenidone early. Your lung function is too severe for the trial at this time  Wear oxygen at 2 liters at rest and 4 liters pulsed with exertion  Return 3 months   -----  Did pt mean to request a pred taper or just needs rxs for the 15 mg qd? Called, spoke with pt.  Pt states she isn't wanting a pred taper.  She is only needing pred rx for 15 mg daily.  Advised rx would be sent.  She verbalized understanding and voiced no further questions or concerns at this time.

## 2013-03-13 ENCOUNTER — Other Ambulatory Visit: Payer: Self-pay | Admitting: Critical Care Medicine

## 2013-03-15 MED ORDER — PREDNISONE 10 MG PO TABS: ORAL_TABLET | ORAL | Status: DC

## 2013-03-15 NOTE — Telephone Encounter (Signed)
See refill request from 03/11/16 for further information

## 2013-03-16 ENCOUNTER — Telehealth: Payer: Self-pay | Admitting: Critical Care Medicine

## 2013-03-16 NOTE — Telephone Encounter (Signed)
Rx was sent on 03-15-13 and confirmed with [facility] that RX is there to pick up; pt made aware. Nothing more needed at this time.

## 2013-04-08 ENCOUNTER — Other Ambulatory Visit (HOSPITAL_COMMUNITY): Payer: Self-pay | Admitting: Nurse Practitioner

## 2013-04-08 DIAGNOSIS — Z1231 Encounter for screening mammogram for malignant neoplasm of breast: Secondary | ICD-10-CM

## 2013-04-16 ENCOUNTER — Ambulatory Visit (HOSPITAL_COMMUNITY)
Admission: RE | Admit: 2013-04-16 | Discharge: 2013-04-16 | Disposition: A | Discharge status: Home / Self Care | Payer: BC Managed Care – PPO | Source: Ambulatory Visit | Attending: Nurse Practitioner | Admitting: Nurse Practitioner

## 2013-04-16 DIAGNOSIS — Z1231 Encounter for screening mammogram for malignant neoplasm of breast: Secondary | ICD-10-CM | POA: Insufficient documentation

## 2013-04-20 ENCOUNTER — Encounter: Payer: Self-pay | Admitting: Critical Care Medicine

## 2013-04-20 ENCOUNTER — Telehealth: Payer: Self-pay | Admitting: Critical Care Medicine

## 2013-04-20 ENCOUNTER — Other Ambulatory Visit: Payer: Self-pay | Admitting: Nurse Practitioner

## 2013-04-20 ENCOUNTER — Ambulatory Visit (INDEPENDENT_AMBULATORY_CARE_PROVIDER_SITE_OTHER): Payer: BC Managed Care – PPO | Admitting: Critical Care Medicine

## 2013-04-20 VITALS — BP 96/70 | HR 76 | Temp 98.4°F | Ht 64.0 in | Wt 146.4 lb

## 2013-04-20 DIAGNOSIS — R928 Other abnormal and inconclusive findings on diagnostic imaging of breast: Secondary | ICD-10-CM

## 2013-04-20 DIAGNOSIS — J9610 Chronic respiratory failure, unspecified whether with hypoxia or hypercapnia: Secondary | ICD-10-CM | POA: Insufficient documentation

## 2013-04-20 DIAGNOSIS — J8410 Pulmonary fibrosis, unspecified: Secondary | ICD-10-CM

## 2013-04-20 MED ORDER — PREDNISONE 10 MG PO TABS: 10.0000 mg | ORAL_TABLET | Freq: Every day | ORAL | Status: DC

## 2013-04-20 NOTE — Assessment & Plan Note (Signed)
Pulmonary fibrosis with basilar bronchiectasis likely from microaspiration now improved with chronic prednisone therapy No chronic respiratory failure also improved with improved oxygenation Plan Reduce prednisone to 10mg  daily Reduce oxygen to 2Liter rest and 2Liter exertion but ok to not wear oxygen short distances and at rest during the day No other medication changes Return for flu vaccine (high dose version) this fall Return 4 months for recheck

## 2013-04-20 NOTE — Patient Instructions (Addendum)
Reduce prednisone to 10mg  daily Reduce oxygen to 2Liter rest and 2Liter exertion but ok to not wear oxygen short distances and at rest during the day No other medication changes Return for flu vaccine (high dose version) this fall Return 4 months for recheck

## 2013-04-20 NOTE — Telephone Encounter (Signed)
The pt wanted to know if Dr. Delford Field thought it was safe for her to return to the pool? Please advise. Carron Curie, CMA

## 2013-04-20 NOTE — Telephone Encounter (Signed)
Pt advised. Jennifer Castillo, CMA  

## 2013-04-20 NOTE — Telephone Encounter (Signed)
Yes , she can go back in the pool

## 2013-04-20 NOTE — Assessment & Plan Note (Signed)
Chronic respiratory failure improved do to pulmonary fibrosis Plan Reduce oxygen to 2 L during sleep and exertion and may be off oxygen at rest

## 2013-04-20 NOTE — Progress Notes (Signed)
Subjective:    Patient ID: Courtney Faulkner, female    DOB: May 11, 1951, 62 y.o.   MRN: [id number]  HPI  01/15/2013 Dyspnea is better. No real cough.  No chest pain.  No dysphagia.  No qhs dyspnea.  Ex smoker 1ppd x 64yrs No edema in feet.    04/20/2013 Chief Complaint  Patient presents with  . 3 month follow up    Breathing doing well overall.  Does DOE.  No wheezing, chest tightness, chest pain, or cough at this time.  Dyspnea is at baseline.  No cough or mucus.  No real wheezing.  No real edema.  No nausea or emesis.     Past Medical History  Diagnosis Date  . ICAO (internal carotid artery occlusion), left 12/15/2011  . 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 CVA (cerebral infarction) 12/15/2011  . Postinflammatory pulmonary fibrosis      Family History  Problem Relation Age of Onset  . Coronary artery disease Neg Hx   . Hypertension Mother   . COPD Father   . Diabetes       History   Social History  . Marital Status: Married    Spouse Name: N/A    Number of Children: 3  . 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 Smoking status: Former Smoker -- 1.00 packs/day for 20 years    Types: Cigarettes    Quit date: 08/26/2001  . Smokeless tobacco: Never Used  . Alcohol Use: No  . Drug Use: No  . Sexual Activity: Yes    Birth Control/ Protection: Post-menopausal   Other Topics Concern  . Not on file   Social History Narrative   Pt lives at home with her family.   Caffeine Use- quit intake 11/2011     No Known Allergies   Outpatient Prescriptions Prior to Visit  Medication Sig Dispense Refill  . aspirin 81 MG tablet Take 81 mg by mouth daily.      . metoprolol tartrate (LOPRESSOR) 25 MG tablet Take 1 tablet (25 mg total) by mouth 2 (two) times daily.  60 tablet  1  . predniSONE (DELTASONE) 10 MG tablet Reduce to 15mg  daily and stay  45 tablet  4   No facility-administered medications prior to  visit.      Review of Systems  Constitutional: Positive for activity change and unexpected weight change. Negative for chills, diaphoresis, appetite change and fatigue.  HENT: Negative for hearing loss, nosebleeds, congestion, facial swelling, sneezing, mouth sores, trouble swallowing, neck stiffness, dental problem, voice change, postnasal drip, sinus pressure, tinnitus and ear discharge.   Eyes: Negative for photophobia, discharge, itching and visual disturbance.  Respiratory: Positive for cough. Negative for apnea, choking, chest tightness and stridor.   Cardiovascular: Negative for palpitations.  Gastrointestinal: Negative for nausea, constipation, blood in stool and abdominal distention.  Genitourinary: Negative for dysuria, urgency, frequency, hematuria, flank pain, decreased urine volume and difficulty urinating.  Musculoskeletal: Negative for myalgias, back pain, joint swelling, arthralgias and gait problem.  Skin: Negative for color change and pallor.  Neurological: Positive for speech difficulty. Negative for dizziness, tremors, seizures, syncope, weakness, light-headedness and numbness.  Hematological: Negative for adenopathy. Does not bruise/bleed easily.  Psychiatric/Behavioral: Positive for sleep disturbance. Negative for confusion and agitation. The patient is not nervous/anxious.        Objective:   Physical Exam  Filed Vitals:   04/20/13 1103  BP:  96/70  Pulse: 76  Temp: 98.4 F (36.9 C)  TempSrc: Oral  Height: 5\' 4"  (1.626 m)  Weight: 146 lb 6.4 oz (66.407 kg)  SpO2: 100%    Gen: Pleasant, well-nourished, in no distress,  normal affect  ENT: No lesions,  mouth clear,  oropharynx clear, no postnasal drip  Neck: No JVD, no TMG, no carotid bruits  Lungs: No use of accessory muscles, no dullness to percussion, dry rales with fair airflow  Cardiovascular: RRR, heart sounds normal, no murmur or gallops, no peripheral edema  Abdomen: soft and NT, no HSM,  BS  normal  Musculoskeletal: No deformities, no cyanosis or clubbing  Neuro: alert, non focal  Skin: Warm, no lesions or rashes       Assessment & Plan:   Postinflammatory pulmonary fibrosis Pulmonary fibrosis with basilar bronchiectasis likely from microaspiration now improved with chronic prednisone therapy No chronic respiratory failure also improved with improved oxygenation Plan Reduce prednisone to 10mg  daily Reduce oxygen to 2Liter rest and 2Liter exertion but ok to not wear oxygen short distances and at rest during the day No other medication changes Return for flu vaccine (high dose version) this fall Return 4 months for recheck   Chronic respiratory failure Chronic respiratory failure improved do to pulmonary fibrosis Plan Reduce oxygen to 2 L during sleep and exertion and may be off oxygen at rest    Updated Medication List Outpatient Encounter Prescriptions as of 04/20/2013  Medication Sig Dispense Refill  . aspirin 81 MG tablet Take 81 mg by mouth daily.      . metoprolol tartrate (LOPRESSOR) 25 MG tablet Take 1 tablet (25 mg total) by mouth 2 (two) times daily.  60 tablet  1  . Multiple Vitamin (MULTIVITAMIN) tablet Take 1 tablet by mouth daily.      . predniSONE (DELTASONE) 10 MG tablet Take 1 tablet (10 mg total) by mouth daily.  45 tablet  4  . [DISCONTINUED] predniSONE (DELTASONE) 10 MG tablet Reduce to 15mg  daily and stay  45 tablet  4   No facility-administered encounter medications on file as of 04/20/2013.

## 2013-05-04 ENCOUNTER — Ambulatory Visit
Admission: RE | Admit: 2013-05-04 | Discharge: 2013-05-04 | Disposition: A | Discharge status: Home / Self Care | Payer: BC Managed Care – PPO | Source: Ambulatory Visit | Attending: Nurse Practitioner | Admitting: Nurse Practitioner

## 2013-05-04 DIAGNOSIS — R928 Other abnormal and inconclusive findings on diagnostic imaging of breast: Secondary | ICD-10-CM

## 2013-05-10 ENCOUNTER — Telehealth: Payer: Self-pay | Admitting: Critical Care Medicine

## 2013-05-10 NOTE — Telephone Encounter (Signed)
Patient Instructions    Reduce prednisone to 10mg  daily Reduce oxygen to 2Liter rest and 2Liter exertion but ok to not wear oxygen short distances and at rest during the day No other medication changes Return for flu vaccine (high dose version) this fall Return 4 months for recheck    I spoke with pt. I advised her we have not received the high dose version as of now. She will call back in a week or so and check on this. Nothing further needed

## 2013-05-11 ENCOUNTER — Other Ambulatory Visit: Payer: BC Managed Care – PPO

## 2013-05-13 ENCOUNTER — Telehealth (HOSPITAL_COMMUNITY): Payer: Self-pay | Admitting: *Deleted

## 2013-05-17 ENCOUNTER — Telehealth: Payer: Self-pay | Admitting: Critical Care Medicine

## 2013-05-17 NOTE — Telephone Encounter (Signed)
Pt is aware that we do not have the "high dose" flu vaccine at this time. Nothing further was needed.

## 2013-05-19 ENCOUNTER — Encounter (HOSPITAL_COMMUNITY): Payer: Self-pay

## 2013-05-19 ENCOUNTER — Encounter (HOSPITAL_COMMUNITY)
Admission: RE | Admit: 2013-05-19 | Discharge: 2013-05-19 | Disposition: A | Discharge status: Home / Self Care | Payer: BC Managed Care – PPO | Source: Ambulatory Visit | Attending: Critical Care Medicine | Admitting: Critical Care Medicine

## 2013-05-19 DIAGNOSIS — M542 Cervicalgia: Secondary | ICD-10-CM | POA: Insufficient documentation

## 2013-05-19 DIAGNOSIS — M25519 Pain in unspecified shoulder: Secondary | ICD-10-CM | POA: Insufficient documentation

## 2013-05-19 DIAGNOSIS — I69959 Hemiplegia and hemiparesis following unspecified cerebrovascular disease affecting unspecified side: Secondary | ICD-10-CM | POA: Insufficient documentation

## 2013-05-19 DIAGNOSIS — M79609 Pain in unspecified limb: Secondary | ICD-10-CM | POA: Insufficient documentation

## 2013-05-19 DIAGNOSIS — Z5189 Encounter for other specified aftercare: Secondary | ICD-10-CM | POA: Insufficient documentation

## 2013-05-19 HISTORY — DX: Cerebral infarction, unspecified: I63.9

## 2013-05-19 NOTE — Progress Notes (Signed)
Courtney Faulkner arrived today for orientation to Pulmonary Rehab.  She walked from the [employer].  Mild dyspnea on arrival.  She is not using oxygen.  States she only uses it when she feels like she needs it.  She also does not have pulse oximeter.  She is oriented to time and place, speech is slightly slurred and mild drooping around her mouth.  She does also show signs of weakness iin her right arm and leg, states she does not use an assistive device. Balance appears to be good.   Grip strength less on right side.  Good distal pulses, no peripheral edema noted. She is well dressed, skin warm and dry.  She is reserved in conversation.   Noted irregular heart rate when checking vital signs, placed on cardiac monitor, rhythm strip showed frequent PACs   Breath sounds clear with crackles in bases, greater in right.  Medical history and medications reviewed with patient.  We will request medical records and EKG from Dr. Corky Sing office.  Department guidelines discussed and obtainable goals set.  She would like to be able to walk more, possible get back to water aerobics and have more stamina to do activities involving her young grandson. Demonstration and practice of PLB using pulse oximeter.  Patient able to return demonstration satisfactorily.  Use of oxygen discussed with patient and will be determined at 6 min walk test which will be done on 05/20/13.  Safety and hand hygiene in the exercise area reviewed with patient.  Patient voices understanding.She will begin exercise on 05/25/13.  We look forward to working with this nice lady.   10:45 to 11:58am Courtney Olden RN

## 2013-05-20 ENCOUNTER — Encounter (HOSPITAL_COMMUNITY)
Admission: RE | Admit: 2013-05-20 | Discharge: 2013-05-20 | Disposition: A | Discharge status: Home / Self Care | Payer: BC Managed Care – PPO | Source: Ambulatory Visit

## 2013-05-25 ENCOUNTER — Encounter (HOSPITAL_COMMUNITY)
Admission: RE | Admit: 2013-05-25 | Discharge: 2013-05-25 | Disposition: A | Discharge status: Home / Self Care | Payer: BC Managed Care – PPO | Source: Ambulatory Visit | Attending: Critical Care Medicine | Admitting: Critical Care Medicine

## 2013-05-27 ENCOUNTER — Encounter (HOSPITAL_COMMUNITY)
Admission: RE | Admit: 2013-05-27 | Discharge: 2013-05-27 | Disposition: A | Discharge status: Home / Self Care | Payer: BC Managed Care – PPO | Source: Ambulatory Visit | Attending: Critical Care Medicine | Admitting: Critical Care Medicine

## 2013-05-27 DIAGNOSIS — I69959 Hemiplegia and hemiparesis following unspecified cerebrovascular disease affecting unspecified side: Secondary | ICD-10-CM | POA: Insufficient documentation

## 2013-05-27 DIAGNOSIS — M79609 Pain in unspecified limb: Secondary | ICD-10-CM | POA: Insufficient documentation

## 2013-05-27 DIAGNOSIS — M542 Cervicalgia: Secondary | ICD-10-CM | POA: Insufficient documentation

## 2013-05-27 DIAGNOSIS — Z5189 Encounter for other specified aftercare: Secondary | ICD-10-CM | POA: Insufficient documentation

## 2013-05-27 DIAGNOSIS — M25519 Pain in unspecified shoulder: Secondary | ICD-10-CM | POA: Insufficient documentation

## 2013-06-01 ENCOUNTER — Encounter (HOSPITAL_COMMUNITY)
Admission: RE | Admit: 2013-06-01 | Discharge: 2013-06-01 | Disposition: A | Discharge status: Home / Self Care | Payer: BC Managed Care – PPO | Source: Ambulatory Visit | Attending: Critical Care Medicine | Admitting: Critical Care Medicine

## 2013-06-03 ENCOUNTER — Encounter (HOSPITAL_COMMUNITY)
Admission: RE | Admit: 2013-06-03 | Discharge: 2013-06-03 | Disposition: A | Discharge status: Home / Self Care | Payer: BC Managed Care – PPO | Source: Ambulatory Visit | Attending: Critical Care Medicine | Admitting: Critical Care Medicine

## 2013-06-08 ENCOUNTER — Encounter (HOSPITAL_COMMUNITY)
Admission: RE | Admit: 2013-06-08 | Discharge: 2013-06-08 | Disposition: A | Discharge status: Home / Self Care | Payer: BC Managed Care – PPO | Source: Ambulatory Visit | Attending: Critical Care Medicine | Admitting: Critical Care Medicine

## 2013-06-08 ENCOUNTER — Ambulatory Visit (INDEPENDENT_AMBULATORY_CARE_PROVIDER_SITE_OTHER): Payer: BC Managed Care – PPO

## 2013-06-08 DIAGNOSIS — Z23 Encounter for immunization: Secondary | ICD-10-CM

## 2013-06-09 DIAGNOSIS — Z23 Encounter for immunization: Secondary | ICD-10-CM

## 2013-06-10 ENCOUNTER — Encounter (HOSPITAL_COMMUNITY)
Admission: RE | Admit: 2013-06-10 | Discharge: 2013-06-10 | Disposition: A | Discharge status: Home / Self Care | Payer: BC Managed Care – PPO | Source: Ambulatory Visit | Attending: Critical Care Medicine | Admitting: Critical Care Medicine

## 2013-06-11 IMAGING — CT CT ANGIO HEAD
1 of 9 series · 6 of 33 positions shown · IV contrast (omnipaque)
Comparison: 12/14/2011 MR brain and head CT.

CTA NECK

CLINICAL DATA: Left hemispheric infarct.   Right-sided weakness.

CT ANGIOGRAPHY HEAD AND NECK
TECHNIQUE: Multidetector CT imaging of the head and neck was
performed using the standard protocol during bolus administration
of intravenous contrast.  Multiplanar CT image reconstructions
including MIPs were obtained to evaluate the vascular anatomy.
Carotid stenosis measurements (when applicable) are obtained
utilizing NASCET criteria, using the distal internal carotid
diameter as the denominator.
Contrast: 50mL OMNIPAQUE IOHEXOL 350 MG/ML SOLN

[mpr, ax 1x1 mpr, axial · axial · 0.49mm/px · z∈[+140,+355]mm · 6 of 303 slices shown]
[im 44/303  soft-tissue]
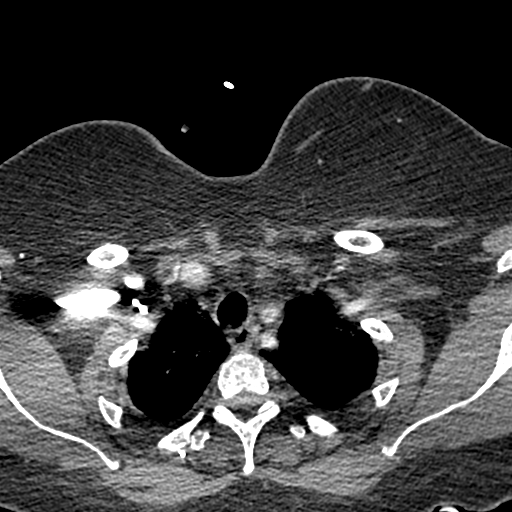
[im 87/303  bone]
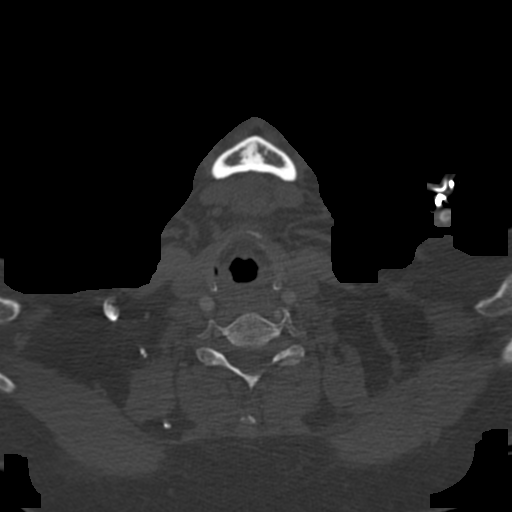
[im 130/303  soft-tissue]
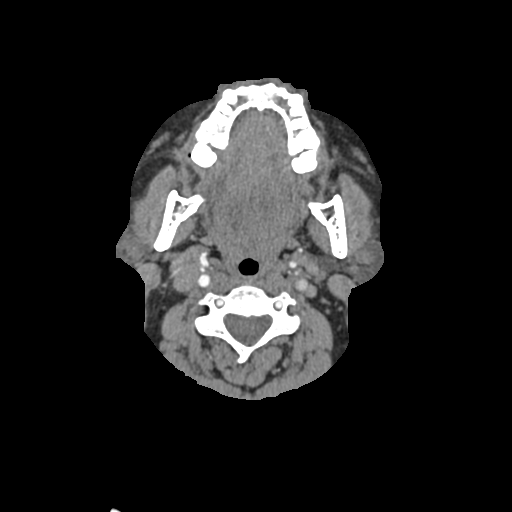
[im 173/303  bone]
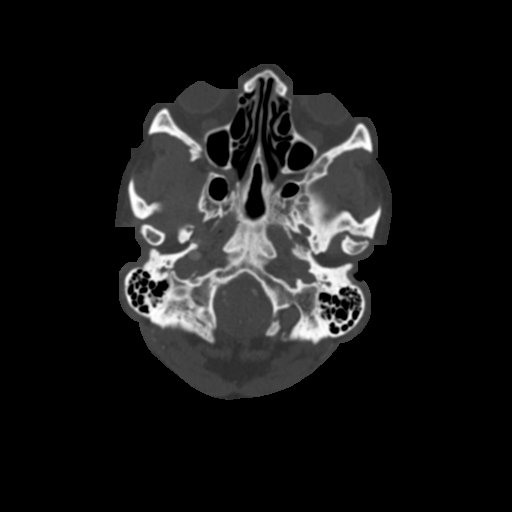
[im 216/303  soft-tissue]
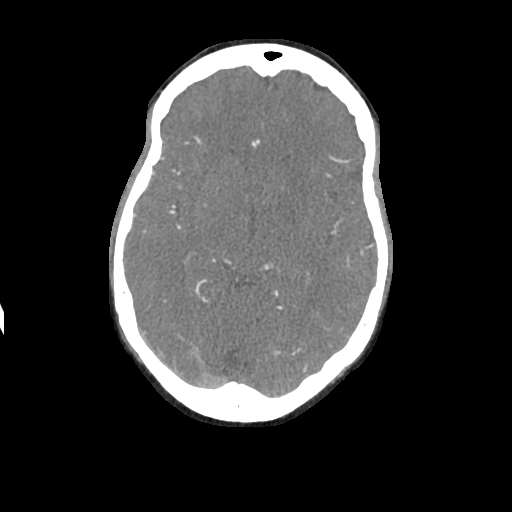
[im 259/303  bone]
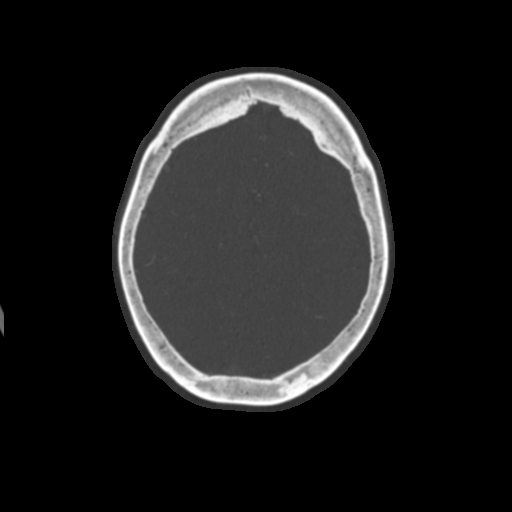

[6 of 33 positions shown; findings below may reference images not displayed]

FINDINGS: Abnormal appearance of the left internal carotid artery
with appearance of dissection with complete occlusion at the C2
level.

Abnormal appearance of the proximal left common carotid artery.
Question if this is related to artifact versus dissection versus
atherosclerotic type changes.

Common origin of the right innominate artery and left common
carotid artery.

Mild narrowing proximal right common carotid artery.  Prominent
ectasia of the proximal right common carotid artery with fold and
mild narrowing.

Mild irregularity at the right carotid bifurcation without
measurable stenosis.

Mild to moderate narrowing proximal right vertebral artery.  Mild
irregularity throughout the course of the right vertebral artery.

Left vertebral artery arises directly from the aortic arch. Mild
narrowing proximal left vertebral artery.  Evaluation of portions
of the proximal left vertebral artery is limited by artifact.

Nonspecific parenchymal changes lung apices greater on the right.
Prominent preaortic lymph node.  Findings can be evaluated with a
formal chest CT.

Mild cervical spondylotic changes.

 Review of the MIP images confirms the above findings.
IMPRESSION: Abnormal appearance of the left internal carotid artery with
appearance of dissection with complete occlusion at the C2 level.

Abnormal appearance of the proximal left common carotid artery.
Question if this is related to artifact versus dissection versus
atherosclerotic type changes.

Mild narrowing proximal right common carotid artery.  Prominent
ectasia of the proximal right common carotid artery with fold and
mild narrowing.

Mild irregularity at the right carotid bifurcation without
measurable stenosis.

Mild to moderate narrowing proximal right vertebral artery.  Mild
irregularity throughout the course of the right vertebral artery.

Left vertebral artery arises directly from the aortic arch. Mild
narrowing proximal left vertebral artery.  Evaluation of portions
of the proximal left vertebral artery is limited by artifact.

Nonspecific parenchymal changes lung apices greater on the right.
Findings can be evaluated with a formal chest CT.

CTA HEAD
FINDINGS: Acute non hemorrhagic infarct left basal ganglia, left
uncus and left corona radiata.

Left internal carotid artery is occluded.  Collateral flow to the
left anterior cerebral artery and left middle cerebral artery
branches.  The flow within the left middle cerebral artery branches
is diminished suggesting that collateral flow is not adequate. Non
filling of the left carotid terminus and majority of the proximal
M1 segment of the left middle cerebral artery.

Mild irregularity vertebral arteries bilaterally.

Irregularity with mild narrowing basilar artery.

No aneurysm or vascular malformation detected.

No intracranial mass or abnormal enhancement.

Cerebellar tonsils minimally low-lying but within range normal
limits.

 Review of the MIP images confirms the above findings.
IMPRESSION: Acute non hemorrhagic infarct left basal ganglia, left uncus and
left corona radiata.

Left internal carotid artery is occluded.  Collateral flow to the
left anterior cerebral artery and left middle cerebral artery
branches.  The flow within the left middle cerebral artery branches
is diminished suggesting that collateral flow is not adequate.

Initial interpretation at the time of imaging by Dr. Jeyma with
preliminary result rendered  (attempted contact of ordering
physcian unsuscessful).

## 2013-06-15 ENCOUNTER — Encounter (HOSPITAL_COMMUNITY)
Admission: RE | Admit: 2013-06-15 | Discharge: 2013-06-15 | Disposition: A | Discharge status: Home / Self Care | Payer: BC Managed Care – PPO | Source: Ambulatory Visit | Attending: Critical Care Medicine | Admitting: Critical Care Medicine

## 2013-06-15 ENCOUNTER — Encounter (HOSPITAL_COMMUNITY): Payer: Self-pay

## 2013-06-15 NOTE — Progress Notes (Signed)
I have reviewed a Home Exercise Program with the patient.  Courtney Faulkner is currently walking around her neighborhood 2 days a week for 20-30 minutes.  She states that one of her goals is to make it all around her neighborhood which is 1 mile and takes her about 40 minutes. Courtney Faulkner states that she feels that she could walk the entire 40 minutes right now but is hesitant because she hasn't done it since she got sick.  I have encouraged Courtney Faulkner to increase her walking duration until she reaches that 40 minute mark.  Courtney Faulkner states that she is going to purchase a pulse oximeter to bring on her walks in order to monitor her heart rate and oxygen saturation.  Courtney Faulkner states that some other goals is to perform resistance training and get back to the level of exercise before she got sick.  We reviewed exercise guidelines, target heart rate during exercise, oxygen use, weather, home pulse oximeter, endpoints for exercise, and goals.  Patient has stated that they understand and are encouraged to come to me with any questions.

## 2013-06-17 ENCOUNTER — Encounter (HOSPITAL_COMMUNITY)
Admission: RE | Admit: 2013-06-17 | Discharge: 2013-06-17 | Disposition: A | Discharge status: Home / Self Care | Payer: BC Managed Care – PPO | Source: Ambulatory Visit | Attending: Critical Care Medicine | Admitting: Critical Care Medicine

## 2013-06-17 ENCOUNTER — Encounter: Payer: Self-pay | Admitting: Nurse Practitioner

## 2013-06-17 ENCOUNTER — Ambulatory Visit: Payer: BC Managed Care – PPO | Admitting: Nurse Practitioner

## 2013-06-17 ENCOUNTER — Ambulatory Visit (INDEPENDENT_AMBULATORY_CARE_PROVIDER_SITE_OTHER): Payer: BC Managed Care – PPO | Admitting: Nurse Practitioner

## 2013-06-17 DIAGNOSIS — I6529 Occlusion and stenosis of unspecified carotid artery: Secondary | ICD-10-CM

## 2013-06-17 NOTE — Patient Instructions (Signed)
Continue Aspirin 81 mg for stroke prevention and strict control of hypertension with blood pressure goal below 130/90.  Return for followup in 1 year.

## 2013-06-17 NOTE — Progress Notes (Signed)
[facility]  PATIENT: Courtney Faulkner DOB: 1950-10-29   REASON FOR VISIT: follow up HISTORY FROM: patient  HISTORY OF PRESENT ILLNESS: 16 year African American female with history of left MCA infarct from left carotid occlusion secondary to left atrial myxoma. In April 2013 History of atrial fibrillation status post-surgical ablation and remote smoking quit in 2003.  She returns for followup of her last visit on 06/17/12. She has been started on home oxygen and Prednisone for her shortness of breath by Dr. Delford Field. She has not had any recurrent stroke or TIA symptoms since April 2013. She remains on warfarin and is tolerating it well without bleeding of the side effects. She continues to have mild right hand weakness as well as drags her right leg when walking. She has had no recent falls. She states her blood pressure has been under good control.   UPDATE 06/17/13 (LL):  Courtney Faulkner returns to office for 6 month stroke follow up.  She states that she is doing well.   Her BP in office is 99/62, she asks when she would be able to come off of Metoprolol.  I advise to her speak to Dr. Katrinka Blazing about it, as we did not prescribe it, but I explain that it is likely for heart rate control instead of high BP.  She is attending pulmonary rehab classes at [facility] for her pulmonary fibrosis.  She has no neurovascular complaints.  ROS:  14 system review of systems is positive for shortness of breath, dyspnea.  ALLERGIES: No Known Allergies  HOME MEDICATIONS: Outpatient Prescriptions Prior to Visit  Medication Sig Dispense Refill  . aspirin 81 MG tablet Take 81 mg by mouth daily.      . calcium-vitamin D (OSCAL WITH D) 250-125 MG-UNIT per tablet Take 1 tablet by mouth daily.      . metoprolol tartrate (LOPRESSOR) 25 MG tablet Take 1 tablet (25 mg total) by mouth 2 (two) times daily.  60 tablet  1  . Multiple Vitamin (MULTIVITAMIN) tablet Take 1 tablet by mouth daily.      . predniSONE  (DELTASONE) 10 MG tablet Take 1 tablet (10 mg total) by mouth daily.  45 tablet  4   No facility-administered medications prior to visit.    PAST MEDICAL HISTORY: Past Medical History  Diagnosis Date  . ICAO (internal carotid artery occlusion), left 12/15/2011  . 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 CVA (cerebral infarction) 12/15/2011  . Postinflammatory pulmonary fibrosis   . Stroke     PAST SURGICAL HISTORY: Past Surgical History  Procedure Laterality Date  . Minimally-invasive excision of left atrial myxoma  12/24/2011    Dr Cornelius Moras    FAMILY HISTORY: Family History  Problem Relation Age of Onset  . Coronary artery disease Neg Hx   . Hypertension Mother   . COPD Father   . Diabetes      SOCIAL HISTORY: History   Social History  . Marital Status: Married    Spouse Name: micheal    Number of Children: 3  . 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 Smoking status: Former Smoker -- 0.50 packs/day for 20 years    Types: Cigarettes    Quit date: 08/26/2001  . Smokeless tobacco: Never Used  . Alcohol Use: 0.6 oz/week    1 Glasses of wine per week  . Drug Use: No  . Sexual Activity: Yes  Birth Control/ Protection: Post-menopausal   Other Topics Concern  . Not on file   Social History Narrative   Pt lives at home with her family.   Caffeine Use- quit intake 11/2011     PHYSICAL EXAM  Filed Vitals:   06/17/13 1434  BP: 99/62  Pulse: 112  Temp: 98.1 F (36.7 C)  TempSrc: Oral  Height: 5' 4.25" (1.632 m)  Weight: 142 lb (64.411 kg)   Body mass index is 24.18 kg/(m^2).  General: well developed, well nourished, seated, in no evident distress  Head: head normocephalic and atraumatic. Orohparynx benign  Neck: supple with no carotid or supraclavicular bruits  Cardiovascular: regular rate and rhythm, no murmurs  Musculoskeletal: no deformity  Skin: no rash/petichiae  Vascular: Normal  pulses all extremities   Neurologic Exam  Mental Status: Awake and fully alert. Oriented to place and time. Recent and remote memory intact. Attention span, concentration and fund of knowledge appropriate. Mood and affect appropriate.  Cranial Nerves: Pupils equal, briskly reactive to light. Extraocular movements full without nystagmus. Visual fields full to confrontation. Hearing intact. Facial sensation intact. Face, tongue, palate moves normally and symmetrically.  Motor: Normal bulk and tone. Normal strength in all tested extremity muscles.  mild right facial droop as well as diminished fine motor skills on the right and orbits left over right upper extremity. Mild weakness of ankle dorsiflexors and right grip  Sensory: intact to tough and pinprick and vibratory.  Coordination: Rapid alternating movements normal in all extremities. Finger-to-nose and heel-to-shin performed accurately bilaterally.  Gait and Station: Arises from chair without difficulty. Stance is normal. Gait demonstrates normal stride length and balance . Able to heel, toe and tandem walk without difficulty.  Reflexes: 1+ and symmetric. Toes downgoing.   DIAGNOSTIC DATA (LABS, IMAGING, TESTING) - I reviewed patient records, labs, notes, testing and imaging myself where available.  12/24/12 Carotid Doppler - total occlusion of the Left ICA.  ASSESSMENT AND PLAN 62 y.o. year old female  has a past medical history of ICAO (internal carotid artery occlusion), left (12/15/2011); Atrial myxoma (12/17/2011); CVA (cerebral infarction) (12/15/2011); Postinflammatory pulmonary fibrosis; here for stroke follow up.  PLAN: Continue Aspirin 81 mg for stroke prevention and strict control of hypertension with blood pressure goal below 130/90.  Return for followup in 1 year.  Ronal Fear, MSN, NP-C 06/17/2013, 2:54 PM [facility] [address], [address] [place], [place] [address] [phone]

## 2013-06-22 ENCOUNTER — Encounter (HOSPITAL_COMMUNITY)
Admission: RE | Admit: 2013-06-22 | Discharge: 2013-06-22 | Disposition: A | Discharge status: Home / Self Care | Payer: BC Managed Care – PPO | Source: Ambulatory Visit | Attending: Critical Care Medicine | Admitting: Critical Care Medicine

## 2013-06-22 NOTE — Progress Notes (Signed)
Courtney Faulkner 62 y.o. female Nutrition Note Spoke with pt. Pt is at a normal weight.  Making healthy food choices the majority of the time. Pt states she has been working toward eating healthier recently.  Pt's Rate Your Plate results reviewed with pt.  Pt expressed understanding.  Pt avoids salty food; rarely uses canned/ convenience food.  Pt does not add salt to food.  The role of sodium in lung disease reviewed with pt. Nutrition Diagnosis   Food-and nutrition-related knowledge deficit related to lack of exposure to information as related to diagnosis of pulmonary disease Nutrition Rx/Est. Daily Nutrition Needs for: ? wt maintenance 1700-1900 Kcal  100-115 gm protein   1500 mg or less sodium      Nutrition Intervention   Pt's individual nutrition plan and goals reviewed with pt.   Benefits of adopting healthy eating habits discussed when pt's Rate Your Plate reviewed.   Pt to attend the Nutrition and Lung Disease class   Continual client-centered nutrition education by RD, as part of interdisciplinary care. Goal(s) 1. Describe the benefit of including fruits, vegetables, whole grains, and low-fat dairy products in a healthy meal plan. Monitor and Evaluate progress toward nutrition goal with team.  Nutrition Risk: Low Mickle Plumb, M.Ed, RD, LDN, CDE 06/22/2013 2:47 PM

## 2013-06-24 ENCOUNTER — Encounter (HOSPITAL_COMMUNITY)
Admission: RE | Admit: 2013-06-24 | Discharge: 2013-06-24 | Disposition: A | Discharge status: Home / Self Care | Payer: BC Managed Care – PPO | Source: Ambulatory Visit | Attending: Critical Care Medicine | Admitting: Critical Care Medicine

## 2013-06-29 ENCOUNTER — Encounter (HOSPITAL_COMMUNITY)
Admission: RE | Admit: 2013-06-29 | Discharge: 2013-06-29 | Disposition: A | Discharge status: Home / Self Care | Payer: BC Managed Care – PPO | Source: Ambulatory Visit | Attending: Critical Care Medicine | Admitting: Critical Care Medicine

## 2013-06-29 ENCOUNTER — Telehealth: Payer: Self-pay | Admitting: Critical Care Medicine

## 2013-06-29 DIAGNOSIS — M79609 Pain in unspecified limb: Secondary | ICD-10-CM | POA: Insufficient documentation

## 2013-06-29 DIAGNOSIS — M542 Cervicalgia: Secondary | ICD-10-CM | POA: Insufficient documentation

## 2013-06-29 DIAGNOSIS — M25519 Pain in unspecified shoulder: Secondary | ICD-10-CM | POA: Insufficient documentation

## 2013-06-29 DIAGNOSIS — Z5189 Encounter for other specified aftercare: Secondary | ICD-10-CM | POA: Insufficient documentation

## 2013-06-29 DIAGNOSIS — I69959 Hemiplegia and hemiparesis following unspecified cerebrovascular disease affecting unspecified side: Secondary | ICD-10-CM | POA: Insufficient documentation

## 2013-06-29 NOTE — Telephone Encounter (Signed)
LMOM TCB x1  No mention in last ov note about research Will need to forward to PW who does not return to the office until tomorrow

## 2013-07-01 ENCOUNTER — Encounter (HOSPITAL_COMMUNITY)
Admission: RE | Admit: 2013-07-01 | Discharge: 2013-07-01 | Disposition: A | Discharge status: Home / Self Care | Payer: BC Managed Care – PPO | Source: Ambulatory Visit | Attending: Critical Care Medicine | Admitting: Critical Care Medicine

## 2013-07-01 NOTE — Telephone Encounter (Signed)
Pt returned call.  [name] ° °

## 2013-07-01 NOTE — Telephone Encounter (Signed)
Pt has an appt on 08-09-13 and will keep this appt. Carron Curie, CMA

## 2013-07-01 NOTE — Telephone Encounter (Signed)
LMTCB x 1 

## 2013-07-01 NOTE — Telephone Encounter (Signed)
Dr. Delford Field is this a patient you wanted to refer to research? Carron Curie, CMA

## 2013-07-01 NOTE — Telephone Encounter (Signed)
She did not qualify for the research project but now drug is available so need to make an appt

## 2013-07-06 ENCOUNTER — Encounter (HOSPITAL_COMMUNITY)
Admission: RE | Admit: 2013-07-06 | Discharge: 2013-07-06 | Disposition: A | Discharge status: Home / Self Care | Payer: BC Managed Care – PPO | Source: Ambulatory Visit | Attending: Critical Care Medicine | Admitting: Critical Care Medicine

## 2013-07-08 ENCOUNTER — Encounter (HOSPITAL_COMMUNITY)
Admission: RE | Admit: 2013-07-08 | Discharge: 2013-07-08 | Disposition: A | Discharge status: Home / Self Care | Payer: BC Managed Care – PPO | Source: Ambulatory Visit | Attending: Critical Care Medicine | Admitting: Critical Care Medicine

## 2013-07-13 ENCOUNTER — Encounter (HOSPITAL_COMMUNITY)
Admission: RE | Admit: 2013-07-13 | Discharge: 2013-07-13 | Disposition: A | Discharge status: Home / Self Care | Payer: BC Managed Care – PPO | Source: Ambulatory Visit | Attending: Critical Care Medicine | Admitting: Critical Care Medicine

## 2013-07-15 ENCOUNTER — Encounter (HOSPITAL_COMMUNITY)
Admission: RE | Admit: 2013-07-15 | Discharge: 2013-07-15 | Disposition: A | Discharge status: Home / Self Care | Payer: BC Managed Care – PPO | Source: Ambulatory Visit | Attending: Critical Care Medicine | Admitting: Critical Care Medicine

## 2013-07-20 ENCOUNTER — Encounter (HOSPITAL_COMMUNITY): Payer: BC Managed Care – PPO

## 2013-07-22 ENCOUNTER — Encounter (HOSPITAL_COMMUNITY): Payer: BC Managed Care – PPO

## 2013-07-27 ENCOUNTER — Encounter (HOSPITAL_COMMUNITY)
Admission: RE | Admit: 2013-07-27 | Discharge: 2013-07-27 | Disposition: A | Discharge status: Home / Self Care | Payer: BC Managed Care – PPO | Source: Ambulatory Visit | Attending: Critical Care Medicine | Admitting: Critical Care Medicine

## 2013-07-27 DIAGNOSIS — I69959 Hemiplegia and hemiparesis following unspecified cerebrovascular disease affecting unspecified side: Secondary | ICD-10-CM | POA: Insufficient documentation

## 2013-07-27 DIAGNOSIS — M79609 Pain in unspecified limb: Secondary | ICD-10-CM | POA: Insufficient documentation

## 2013-07-27 DIAGNOSIS — Z5189 Encounter for other specified aftercare: Secondary | ICD-10-CM | POA: Insufficient documentation

## 2013-07-27 DIAGNOSIS — M25519 Pain in unspecified shoulder: Secondary | ICD-10-CM | POA: Insufficient documentation

## 2013-07-27 DIAGNOSIS — M542 Cervicalgia: Secondary | ICD-10-CM | POA: Insufficient documentation

## 2013-07-29 ENCOUNTER — Encounter (HOSPITAL_COMMUNITY)
Admission: RE | Admit: 2013-07-29 | Discharge: 2013-07-29 | Disposition: A | Discharge status: Home / Self Care | Payer: BC Managed Care – PPO | Source: Ambulatory Visit | Attending: Critical Care Medicine | Admitting: Critical Care Medicine

## 2013-08-03 ENCOUNTER — Encounter (HOSPITAL_COMMUNITY)
Admission: RE | Admit: 2013-08-03 | Discharge: 2013-08-03 | Disposition: A | Discharge status: Home / Self Care | Payer: BC Managed Care – PPO | Source: Ambulatory Visit | Attending: Critical Care Medicine | Admitting: Critical Care Medicine

## 2013-08-05 ENCOUNTER — Encounter (HOSPITAL_COMMUNITY)
Admission: RE | Admit: 2013-08-05 | Discharge: 2013-08-05 | Disposition: A | Discharge status: Home / Self Care | Payer: BC Managed Care – PPO | Source: Ambulatory Visit | Attending: Critical Care Medicine | Admitting: Critical Care Medicine

## 2013-08-09 ENCOUNTER — Encounter: Payer: Self-pay | Admitting: Critical Care Medicine

## 2013-08-09 ENCOUNTER — Ambulatory Visit (INDEPENDENT_AMBULATORY_CARE_PROVIDER_SITE_OTHER): Payer: BC Managed Care – PPO | Admitting: Critical Care Medicine

## 2013-08-09 VITALS — BP 106/76 | HR 97 | Temp 97.4°F | Ht 63.0 in | Wt 143.0 lb

## 2013-08-09 DIAGNOSIS — J8410 Pulmonary fibrosis, unspecified: Secondary | ICD-10-CM

## 2013-08-09 MED ORDER — PIRFENIDONE 267 MG PO CAPS: ORAL_CAPSULE | ORAL | Status: DC

## 2013-08-09 NOTE — Assessment & Plan Note (Addendum)
Pulmonary fibrosis usual interstitial pneumonitis type with basilar bronchiectasis This patient would be a good candidate for pirfenidone Plan Begin enrollment in the pirfenidone program Once on pirfenidone we'll make an attempt at tapering prednisone to off Pt will need baseline LFTs

## 2013-08-09 NOTE — Patient Instructions (Signed)
We will get you started on Esbriet for your fibrosis No other medication changes Return 6 weeks

## 2013-08-09 NOTE — Progress Notes (Signed)
Subjective:    Patient ID: Courtney Faulkner, female    DOB: Jan 06, 1951, 62 y.o.   MRN: [id number]  HPI  08/09/2013 Chief Complaint  Patient presents with  . Pulmonary Fibrosis    Breathing is improved. Reports DOE. Denies chest tightness, coughing or wheezing at this time.  Pulm Rehab has helped.  This patient states since being in pulmonary rehabilitation shortness of breath is improved and her overall energy levels have stabilized. The patient has less cough less dyspnea. The patient's on prednisone 10 mg daily now.  Pt denies any significant sore throat, nasal congestion or excess secretions, fever, chills, sweats, unintended weight loss, pleurtic or exertional chest pain, orthopnea PND, or leg swelling Pt denies any increase in rescue therapy over baseline, denies waking up needing it or having any early am or nocturnal exacerbations of coughing/wheezing/or dyspnea. Pt also denies any obvious fluctuation in symptoms with  weather or environmental change or other alleviating or aggravating factors     Past Medical History  Diagnosis Date  . ICAO (internal carotid artery occlusion), left 12/15/2011  . 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 CVA (cerebral infarction) 12/15/2011  . Postinflammatory pulmonary fibrosis   . Stroke      Family History  Problem Relation Age of Onset  . Coronary artery disease Neg Hx   . Hypertension Mother   . COPD Father   . Diabetes       History   Social History  . Marital Status: Married    Spouse Name: micheal    Number of Children: 3  . 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 Smoking status: Former Smoker -- 0.50 packs/day for 20 years    Types: Cigarettes    Quit date: 08/26/2001  . Smokeless tobacco: Never Used  . Alcohol Use: 0.6 oz/week    1 Glasses of wine per week  . Drug Use: No  . Sexual Activity: Yes    Birth Control/ Protection: Post-menopausal    Other Topics Concern  . Not on file   Social History Narrative   Pt lives at home with her family.   Caffeine Use- quit intake 11/2011     No Known Allergies   Outpatient Prescriptions Prior to Visit  Medication Sig Dispense Refill  . aspirin 81 MG tablet Take 81 mg by mouth daily.      . calcium-vitamin D (OSCAL WITH D) 250-125 MG-UNIT per tablet Take 1 tablet by mouth 2 (two) times daily.       . metoprolol tartrate (LOPRESSOR) 25 MG tablet Take 1 tablet (25 mg total) by mouth 2 (two) times daily.  60 tablet  1  . Multiple Vitamin (MULTIVITAMIN) tablet Take 1 tablet by mouth daily.      . predniSONE (DELTASONE) 10 MG tablet Take 1 tablet (10 mg total) by mouth daily.  45 tablet  4   No facility-administered medications prior to visit.      Review of Systems  Constitutional: Positive for activity change and unexpected weight change. Negative for chills, diaphoresis, appetite change and fatigue.  HENT: Negative for congestion, dental problem, ear discharge, facial swelling, hearing loss, mouth sores, nosebleeds, postnasal drip, sinus pressure, sneezing, tinnitus, trouble swallowing and voice change.   Eyes: Negative for photophobia, discharge, itching and visual disturbance.  Respiratory: Positive for cough. Negative for apnea, choking, chest tightness and stridor.   Cardiovascular: Negative for palpitations.  Gastrointestinal:  Negative for nausea, constipation, blood in stool and abdominal distention.  Genitourinary: Negative for dysuria, urgency, frequency, hematuria, flank pain, decreased urine volume and difficulty urinating.  Musculoskeletal: Negative for arthralgias, back pain, gait problem, joint swelling, myalgias and neck stiffness.  Skin: Negative for color change and pallor.  Neurological: Positive for speech difficulty. Negative for dizziness, tremors, seizures, syncope, weakness, light-headedness and numbness.  Hematological: Negative for adenopathy. Does not  bruise/bleed easily.  Psychiatric/Behavioral: Positive for sleep disturbance. Negative for confusion and agitation. The patient is not nervous/anxious.        Objective:   Physical Exam  Filed Vitals:   08/09/13 0950 08/09/13 0951  BP:  106/76  Pulse:  97  Temp: 97.4 F (36.3 C)   TempSrc: Oral   Height: 5\' 3"  (1.6 m)   Weight: 143 lb (64.864 kg)   SpO2:  99%    Gen: Pleasant, well-nourished, in no distress,  normal affect  ENT: No lesions,  mouth clear,  oropharynx clear, no postnasal drip  Neck: No JVD, no TMG, no carotid bruits  Lungs: No use of accessory muscles, no dullness to percussion, dry rales with fair airflow  Cardiovascular: RRR, heart sounds normal, no murmur or gallops, no peripheral edema  Abdomen: soft and NT, no HSM,  BS normal  Musculoskeletal: No deformities, no cyanosis or clubbing  Neuro: alert, non focal  Skin: Warm, no lesions or rashes       Assessment & Plan:   Postinflammatory pulmonary fibrosis Pulmonary fibrosis usual interstitial pneumonitis type with basilar bronchiectasis This patient would be a good candidate for pirfenidone Plan Begin enrollment in the pirfenidone program Once on pirfenidone we'll make an attempt at tapering prednisone to off Pt will need baseline LFTs     Updated Medication List Outpatient Encounter Prescriptions as of 08/09/2013  Medication Sig  . aspirin 81 MG tablet Take 81 mg by mouth daily.  . calcium-vitamin D (OSCAL WITH D) 250-125 MG-UNIT per tablet Take 1 tablet by mouth 2 (two) times daily.   . metoprolol tartrate (LOPRESSOR) 25 MG tablet Take 1 tablet (25 mg total) by mouth 2 (two) times daily.  . Multiple Vitamin (MULTIVITAMIN) tablet Take 1 tablet by mouth daily.  . predniSONE (DELTASONE) 10 MG tablet Take 1 tablet (10 mg total) by mouth daily.  . Pirfenidone (ESBRIET) 267 MG CAPS Take as directed

## 2013-08-10 ENCOUNTER — Telehealth: Payer: Self-pay | Admitting: *Deleted

## 2013-08-10 ENCOUNTER — Encounter (HOSPITAL_COMMUNITY)
Admission: RE | Admit: 2013-08-10 | Discharge: 2013-08-10 | Disposition: A | Discharge status: Home / Self Care | Payer: BC Managed Care – PPO | Source: Ambulatory Visit | Attending: Critical Care Medicine | Admitting: Critical Care Medicine

## 2013-08-10 ENCOUNTER — Other Ambulatory Visit (INDEPENDENT_AMBULATORY_CARE_PROVIDER_SITE_OTHER): Payer: BC Managed Care – PPO

## 2013-08-10 DIAGNOSIS — J8410 Pulmonary fibrosis, unspecified: Secondary | ICD-10-CM

## 2013-08-10 LAB — HEPATIC FUNCTION PANEL
ALT: 16 U/L (ref 0–35)
AST: 25 U/L (ref 0–37)
Albumin: 3.9 g/dL (ref 3.5–5.2)
Total Bilirubin: 0.6 mg/dL (ref 0.3–1.2)

## 2013-08-10 NOTE — Progress Notes (Signed)
Quick Note:  Call pt and tell her liver function labs are normal. We are awaiting the approval on the new medication for the fibrosis of the lungs ______

## 2013-08-10 NOTE — Telephone Encounter (Signed)
Called, spoke with pt.  Informed her she will need baseline LFTs to start Esbriet.  She is aware order is placed for this and to come by office to have done.  She verbalized understanding and voiced no further questions or concerns at this time.

## 2013-08-10 NOTE — Telephone Encounter (Signed)
Pt is returning Crystal's call & can be reached at [phone].  Antionette Fairy

## 2013-08-10 NOTE — Telephone Encounter (Signed)
lmomtcb for pt to inform her of below.

## 2013-08-10 NOTE — Telephone Encounter (Signed)
Message copied by Gweneth Dimitri D on Tue Aug 10, 2013  9:40 AM ------      Message from: Shan Levans E      Created: Mon Aug 09, 2013  2:56 PM       Jasmina Gendron            This pt will need a baseline LFT to get in the Esbriet program      I put order in for the pt             pw ------

## 2013-08-10 NOTE — Progress Notes (Signed)
Quick Note:  Called, spoke with pt. Informed her of lab result and recs per Dr. Delford Field. She verbalized understanding and voiced no further questions or concerns at this time. ______

## 2013-08-10 NOTE — Telephone Encounter (Signed)
lmomtcb on pt's cell # 

## 2013-08-12 ENCOUNTER — Encounter (HOSPITAL_COMMUNITY)
Admission: RE | Admit: 2013-08-12 | Discharge: 2013-08-12 | Disposition: A | Discharge status: Home / Self Care | Payer: BC Managed Care – PPO | Source: Ambulatory Visit | Attending: Critical Care Medicine | Admitting: Critical Care Medicine

## 2013-08-17 ENCOUNTER — Encounter (HOSPITAL_COMMUNITY): Payer: BC Managed Care – PPO

## 2013-08-19 ENCOUNTER — Encounter (HOSPITAL_COMMUNITY): Payer: BC Managed Care – PPO

## 2013-08-24 ENCOUNTER — Encounter (HOSPITAL_COMMUNITY)
Admission: RE | Admit: 2013-08-24 | Discharge: 2013-08-24 | Disposition: A | Discharge status: Home / Self Care | Payer: BC Managed Care – PPO | Source: Ambulatory Visit | Attending: Critical Care Medicine | Admitting: Critical Care Medicine

## 2013-08-26 ENCOUNTER — Encounter (HOSPITAL_COMMUNITY): Payer: BC Managed Care – PPO

## 2013-08-26 DIAGNOSIS — M542 Cervicalgia: Secondary | ICD-10-CM | POA: Insufficient documentation

## 2013-08-26 DIAGNOSIS — M79609 Pain in unspecified limb: Secondary | ICD-10-CM | POA: Insufficient documentation

## 2013-08-26 DIAGNOSIS — Z5189 Encounter for other specified aftercare: Secondary | ICD-10-CM | POA: Insufficient documentation

## 2013-08-26 DIAGNOSIS — M25519 Pain in unspecified shoulder: Secondary | ICD-10-CM | POA: Insufficient documentation

## 2013-08-26 DIAGNOSIS — I69959 Hemiplegia and hemiparesis following unspecified cerebrovascular disease affecting unspecified side: Secondary | ICD-10-CM | POA: Insufficient documentation

## 2013-08-31 ENCOUNTER — Encounter (HOSPITAL_COMMUNITY)
Admission: RE | Admit: 2013-08-31 | Discharge: 2013-08-31 | Disposition: A | Discharge status: Home / Self Care | Payer: BC Managed Care – PPO | Source: Ambulatory Visit | Attending: Critical Care Medicine | Admitting: Critical Care Medicine

## 2013-09-02 ENCOUNTER — Encounter (HOSPITAL_COMMUNITY): Payer: Self-pay

## 2013-09-02 ENCOUNTER — Encounter (HOSPITAL_COMMUNITY)
Admission: RE | Admit: 2013-09-02 | Discharge: 2013-09-02 | Disposition: A | Discharge status: Home / Self Care | Payer: BC Managed Care – PPO | Source: Ambulatory Visit | Attending: Critical Care Medicine | Admitting: Critical Care Medicine

## 2013-09-03 NOTE — Progress Notes (Signed)
Padme completed a Six-Minute Walk Test on 09/02/13 . Katlin walked 1,090 feet with 0 breaks.  The patient's lowest oxygen saturation was 91% , highest heart rate was 69 , and highest blood pressure was 118/68. The patient was on RA.  The patient wears gloves to class because her fingers are normally cold and its hard to get a pulse oximeter reading on her.  During the walk test there was a difficulty in collecting this information with her cold hands.

## 2013-09-07 ENCOUNTER — Encounter (HOSPITAL_COMMUNITY): Payer: BC Managed Care – PPO

## 2013-09-09 ENCOUNTER — Encounter (HOSPITAL_COMMUNITY): Payer: BC Managed Care – PPO

## 2013-09-14 ENCOUNTER — Encounter (HOSPITAL_COMMUNITY): Payer: BC Managed Care – PPO

## 2013-09-20 ENCOUNTER — Encounter: Payer: Self-pay | Admitting: Critical Care Medicine

## 2013-09-20 ENCOUNTER — Ambulatory Visit (INDEPENDENT_AMBULATORY_CARE_PROVIDER_SITE_OTHER): Payer: BC Managed Care – PPO | Admitting: Critical Care Medicine

## 2013-09-20 VITALS — BP 104/76 | HR 79 | Temp 98.4°F | Ht 64.0 in | Wt 144.6 lb

## 2013-09-20 DIAGNOSIS — J8410 Pulmonary fibrosis, unspecified: Secondary | ICD-10-CM

## 2013-09-20 NOTE — Progress Notes (Signed)
Subjective:    Patient ID: Courtney Faulkner, female    DOB: 05/14/1951, 63 y.o.   MRN: [id number]  HPI  09/20/2013 Chief Complaint  Patient presents with  . 6 wk follow up    Reports increased DOE x 2-3 days.  No wheezing, chest tightness/pain, or cough.   Still waiting on Esbriet.  Pt notes more dyspnea for 3days. No real cough. No mucus.  No real chest pain. No edema in feet. No wheeze.  Some ant drip out of nose is clear. On pred 10mg . Uses oxygen QHS 2L. Now out of pulm rehab.  Now at the [facility] on maintenance.  Past Medical History  Diagnosis Date  . ICAO (internal carotid artery occlusion), left 12/15/2011  . 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 CVA (cerebral infarction) 12/15/2011  . Postinflammatory pulmonary fibrosis   . Stroke      Family History  Problem Relation Age of Onset  . Coronary artery disease Neg Hx   . Hypertension Mother   . COPD Father   . Diabetes       History   Social History  . Marital Status: Married    Spouse Name: micheal    Number of Children: 3  . 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 Smoking status: Former Smoker -- 0.50 packs/day for 20 years    Types: Cigarettes    Quit date: 08/26/2001  . Smokeless tobacco: Never Used  . Alcohol Use: 0.6 oz/week    1 Glasses of wine per week  . Drug Use: No  . Sexual Activity: Yes    Birth Control/ Protection: Post-menopausal   Other Topics Concern  . Not on file   Social History Narrative   Pt lives at home with her family.   Caffeine Use- quit intake 11/2011     No Known Allergies   Outpatient Prescriptions Prior to Visit  Medication Sig Dispense Refill  . aspirin 81 MG tablet Take 81 mg by mouth daily.      . calcium-vitamin D (OSCAL WITH D) 250-125 MG-UNIT per tablet Take 1 tablet by mouth 2 (two) times daily.       . metoprolol tartrate (LOPRESSOR) 25 MG tablet Take 1 tablet (25 mg total) by mouth 2 (two)  times daily.  60 tablet  1  . Multiple Vitamin (MULTIVITAMIN) tablet Take 1 tablet by mouth daily.      . predniSONE (DELTASONE) 10 MG tablet Take 1 tablet (10 mg total) by mouth daily.  45 tablet  4  . Pirfenidone (ESBRIET) 267 MG CAPS Take as directed  270 capsule  4   No facility-administered medications prior to visit.      Review of Systems  Constitutional: Positive for activity change and unexpected weight change. Negative for chills, diaphoresis, appetite change and fatigue.  HENT: Negative for congestion, dental problem, ear discharge, facial swelling, hearing loss, mouth sores, nosebleeds, postnasal drip, sinus pressure, sneezing, tinnitus, trouble swallowing and voice change.   Eyes: Negative for photophobia, discharge, itching and visual disturbance.  Respiratory: Positive for cough. Negative for apnea, choking, chest tightness and stridor.   Cardiovascular: Negative for palpitations.  Gastrointestinal: Negative for nausea, constipation, blood in stool and abdominal distention.  Genitourinary: Negative for dysuria, urgency, frequency, hematuria, flank pain, decreased urine volume and difficulty urinating.  Musculoskeletal: Negative for arthralgias, back pain, gait problem, joint swelling, myalgias and neck stiffness.  Skin: Negative  for color change and pallor.  Neurological: Positive for speech difficulty. Negative for dizziness, tremors, seizures, syncope, weakness, light-headedness and numbness.  Hematological: Negative for adenopathy. Does not bruise/bleed easily.  Psychiatric/Behavioral: Positive for sleep disturbance. Negative for confusion and agitation. The patient is not nervous/anxious.        Objective:   Physical Exam  Filed Vitals:   09/20/13 0941  BP: 104/76  Pulse: 79  Temp: 98.4 F (36.9 C)  TempSrc: Oral  Height: 5\' 4"  (1.626 m)  Weight: 144 lb 9.6 oz (65.59 kg)  SpO2: 99%    Gen: Pleasant, well-nourished, in no distress,  normal affect  ENT: No  lesions,  mouth clear,  oropharynx clear, no postnasal drip  Neck: No JVD, no TMG, no carotid bruits  Lungs: No use of accessory muscles, no dullness to percussion, dry rales with fair airflow  Cardiovascular: RRR, heart sounds normal, no murmur or gallops, no peripheral edema  Abdomen: soft and NT, no HSM,  BS normal  Musculoskeletal: No deformities, no cyanosis or clubbing  Neuro: alert, non focal  Skin: Warm, no lesions or rashes       Assessment & Plan:   Postinflammatory pulmonary fibrosis Pulmonary fibrosis with usual interstitial pneumonitis stable at this time We've been attempting to get pirfenidone for this patient but the order has not been received Plan Maintain prednisone 10 mg daily We'll investigate why there's been a delay in receiving pirfenidone    Updated Medication List Outpatient Encounter Prescriptions as of 09/20/2013  Medication Sig  . aspirin 81 MG tablet Take 81 mg by mouth daily.  . calcium-vitamin D (OSCAL WITH D) 250-125 MG-UNIT per tablet Take 1 tablet by mouth 2 (two) times daily.   . metoprolol tartrate (LOPRESSOR) 25 MG tablet Take 1 tablet (25 mg total) by mouth 2 (two) times daily.  . Multiple Vitamin (MULTIVITAMIN) tablet Take 1 tablet by mouth daily.  . predniSONE (DELTASONE) 10 MG tablet Take 1 tablet (10 mg total) by mouth daily.  . Pirfenidone (ESBRIET) 267 MG CAPS Take as directed

## 2013-09-20 NOTE — Patient Instructions (Signed)
I will confirm about the Esbriet Stay on prednisone until on Esbriet Return 2 months

## 2013-09-20 NOTE — Assessment & Plan Note (Signed)
Pulmonary fibrosis with usual interstitial pneumonitis stable at this time We've been attempting to get pirfenidone for this patient but the order has not been received Plan Maintain prednisone 10 mg daily We'll investigate why there's been a delay in receiving pirfenidone

## 2013-10-06 ENCOUNTER — Other Ambulatory Visit: Payer: Self-pay | Admitting: Critical Care Medicine

## 2013-10-14 ENCOUNTER — Telehealth: Payer: Self-pay | Admitting: Critical Care Medicine

## 2013-10-14 NOTE — Telephone Encounter (Signed)
Received fax from [place] stating Courtney Faulkner has been APPROVED from 09/11/2013 - 10/11/2014.  Form placed in Libby's office and copy made to be scanned into chart.    I spoke with Levada Dy, our Esbriet contact.  Informed her of approval.  She will let her pharmacy contact know. The pharmacy will contact pt.    Called, spoke with pt.  Advised of approval letter and explained Levada Dy states pharmacy will be contacting her to proceed.  She verbalized understanding of this and is to call back if she doesn't hear from the [facility] in the next few days.

## 2013-10-20 ENCOUNTER — Telehealth: Payer: Self-pay | Admitting: Critical Care Medicine

## 2013-10-20 NOTE — Telephone Encounter (Signed)
Per the 2.19.15 phone note: Courtney Sports, RN at 10/14/2013 9:58 AM     Received fax from [facility] stating Courtney Faulkner has been APPROVED from 09/11/2013 - 10/11/2014. Form placed in Libby's office and copy made to be scanned into chart.  I spoke with Levada Dy, our Esbriet contact. Informed her of approval. She will let her pharmacy contact know. The pharmacy will contact pt.  Called, spoke with pt. Advised of approval letter and explained Levada Dy states pharmacy will be contacting her to proceed. She verbalized understanding of this and is to call back if she doesn't hear from the [facility] in the next few days.   Called Mamie Nick, our contact at [facility] at [phone] ext [phone].  Angie was not available so spoke with another case manager, Derrell Lolling.  Per Harriette Ohara spoke with the patient on 2.19.15.  Pt is due for shipment from [facility] next week (was not able to provide a specific date) but pt's case has been made a priority by their manager because pt never received her 45 day free trial.  No update has been given to pt.  Called spoke with patient and informed her of the above.  Pt verified that she did speak with someone from [facility] last week and verified that she did not receive her free trial.  Pt aware she is pending a shipment next week.  Apologized to pt for any inconvenience.  If pt does not hear anything regarding her Esbriet by middle of next week, she is to contact us again.  Will go ahead and sign off on message and see if pt calls back next week.

## 2013-11-22 ENCOUNTER — Encounter: Payer: Self-pay | Admitting: Critical Care Medicine

## 2013-11-22 ENCOUNTER — Ambulatory Visit (INDEPENDENT_AMBULATORY_CARE_PROVIDER_SITE_OTHER): Payer: BC Managed Care – PPO | Admitting: Critical Care Medicine

## 2013-11-22 ENCOUNTER — Other Ambulatory Visit (INDEPENDENT_AMBULATORY_CARE_PROVIDER_SITE_OTHER): Payer: BC Managed Care – PPO

## 2013-11-22 VITALS — BP 100/68 | Temp 97.4°F | Ht 63.0 in | Wt 146.0 lb

## 2013-11-22 DIAGNOSIS — J8410 Pulmonary fibrosis, unspecified: Secondary | ICD-10-CM

## 2013-11-22 DIAGNOSIS — T50905S Adverse effect of unspecified drugs, medicaments and biological substances, sequela: Secondary | ICD-10-CM

## 2013-11-22 DIAGNOSIS — J9610 Chronic respiratory failure, unspecified whether with hypoxia or hypercapnia: Secondary | ICD-10-CM

## 2013-11-22 LAB — HEPATIC FUNCTION PANEL
ALBUMIN: 3.6 g/dL (ref 3.5–5.2)
ALT: 21 U/L (ref 0–35)
AST: 19 U/L (ref 0–37)
Alkaline Phosphatase: 38 U/L — ABNORMAL LOW (ref 39–117)
Bilirubin, Direct: 0 mg/dL (ref 0.0–0.3)
TOTAL PROTEIN: 7.5 g/dL (ref 6.0–8.3)
Total Bilirubin: 0.4 mg/dL (ref 0.3–1.2)

## 2013-11-22 MED ORDER — PREDNISONE 10 MG PO TABS: ORAL_TABLET | ORAL | Status: DC

## 2013-11-22 NOTE — Progress Notes (Signed)
Subjective:    Patient ID: Courtney Faulkner, female    DOB: February 21, 1951, 63 y.o.   MRN: [id number]  HPI  11/22/2013 Chief Complaint  Patient presents with  . Pulmonary Fibrosis    Breathing has good days and bad. When starting Pirfenidone breathing was causing problems, that has since resolved.   Pt better since on pirfenidone 11/04/13.   No mucus now. Less dyspneic.  No nausea, on 3 tid pirfenidone.   No chest pain noted.    Past Medical History  Diagnosis Date  . ICAO (internal carotid artery occlusion), left 12/15/2011  . 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 CVA (cerebral infarction) 12/15/2011  . Postinflammatory pulmonary fibrosis   . Stroke      Family History  Problem Relation Age of Onset  . Coronary artery disease Neg Hx   . Hypertension Mother   . COPD Father   . Diabetes       History   Social History  . Marital Status: Married    Spouse Name: micheal    Number of Children: 3  . 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 Smoking status: Former Smoker -- 0.50 packs/day for 20 years    Types: Cigarettes    Quit date: 08/26/2001  . Smokeless tobacco: Never Used  . Alcohol Use: 0.6 oz/week    1 Glasses of wine per week  . Drug Use: No  . Sexual Activity: Yes    Birth Control/ Protection: Post-menopausal   Other Topics Concern  . Not on file   Social History Narrative   Pt lives at home with her family.   Caffeine Use- quit intake 11/2011     No Known Allergies   Outpatient Prescriptions Prior to Visit  Medication Sig Dispense Refill  . aspirin 81 MG tablet Take 81 mg by mouth daily.      . calcium-vitamin D (OSCAL WITH D) 250-125 MG-UNIT per tablet Take 1 tablet by mouth 2 (two) times daily.       . metoprolol tartrate (LOPRESSOR) 25 MG tablet Take 1 tablet (25 mg total) by mouth 2 (two) times daily.  60 tablet  1  . Multiple Vitamin (MULTIVITAMIN) tablet Take 1 tablet by mouth  daily.      . Pirfenidone (ESBRIET) 267 MG CAPS Take as directed  270 capsule  4  . predniSONE (DELTASONE) 10 MG tablet Take 1 tablet (10 mg total) by mouth daily.  30 tablet  1  . predniSONE (DELTASONE) 10 MG tablet Take 1 tablet (10 mg total) by mouth daily.  45 tablet  4   No facility-administered medications prior to visit.      Review of Systems  Constitutional: Positive for activity change and unexpected weight change. Negative for chills, diaphoresis, appetite change and fatigue.  HENT: Negative for congestion, dental problem, ear discharge, facial swelling, hearing loss, mouth sores, nosebleeds, postnasal drip, sinus pressure, sneezing, tinnitus, trouble swallowing and voice change.   Eyes: Negative for photophobia, discharge, itching and visual disturbance.  Respiratory: Positive for cough. Negative for apnea, choking, chest tightness and stridor.   Cardiovascular: Negative for palpitations.  Gastrointestinal: Negative for nausea, constipation, blood in stool and abdominal distention.  Genitourinary: Negative for dysuria, urgency, frequency, hematuria, flank pain, decreased urine volume and difficulty urinating.  Musculoskeletal: Negative for arthralgias, back pain, gait problem, joint swelling, myalgias and neck stiffness.  Skin: Negative for color change and  pallor.  Neurological: Positive for speech difficulty. Negative for dizziness, tremors, seizures, syncope, weakness, light-headedness and numbness.  Hematological: Negative for adenopathy. Does not bruise/bleed easily.  Psychiatric/Behavioral: Positive for sleep disturbance. Negative for confusion and agitation. The patient is not nervous/anxious.        Objective:   Physical Exam  Filed Vitals:   11/22/13 1011 11/22/13 1013  BP:  100/68  Temp: 97.4 F (36.3 C)   TempSrc: Oral   Height: 5\' 3"  (1.6 m)   Weight: 146 lb (66.225 kg)     Gen: Pleasant, well-nourished, in no distress,  normal affect  ENT: No lesions,   mouth clear,  oropharynx clear, no postnasal drip  Neck: No JVD, no TMG, no carotid bruits  Lungs: No use of accessory muscles, no dullness to percussion, dry rales with fair airflow  Cardiovascular: RRR, heart sounds normal, no murmur or gallops, no peripheral edema  Abdomen: soft and NT, no HSM,  BS normal  Musculoskeletal: No deformities, no cyanosis or clubbing  Neuro: alert, non focal  Skin: Warm, no lesions or rashes       Assessment & Plan:   Postinflammatory pulmonary fibrosis Idiopathic pulmonary fibrosis with basilar bronchiectasis stable at this time in improved on pirfenidone Plan Discontinue further prednisone Maintain pirfenidone at full dose of 3 tablets 3 times daily Liver function profile be obtained    Updated Medication List Outpatient Encounter Prescriptions as of 11/22/2013  Medication Sig  . aspirin 81 MG tablet Take 81 mg by mouth daily.  . calcium-vitamin D (OSCAL WITH D) 250-125 MG-UNIT per tablet Take 1 tablet by mouth 2 (two) times daily.   . metoprolol tartrate (LOPRESSOR) 25 MG tablet Take 1 tablet (25 mg total) by mouth 2 (two) times daily.  . Multiple Vitamin (MULTIVITAMIN) tablet Take 1 tablet by mouth daily.  . Pirfenidone (ESBRIET) 267 MG CAPS Take as directed  . predniSONE (DELTASONE) 10 MG tablet Reduce to one every other day for 10days then STOP  . [DISCONTINUED] predniSONE (DELTASONE) 10 MG tablet Take 1 tablet (10 mg total) by mouth daily.  . [DISCONTINUED] predniSONE (DELTASONE) 10 MG tablet Take 1 tablet (10 mg total) by mouth daily.

## 2013-11-22 NOTE — Assessment & Plan Note (Signed)
Idiopathic pulmonary fibrosis with basilar bronchiectasis stable at this time in improved on pirfenidone Plan Discontinue further prednisone Maintain pirfenidone at full dose of 3 tablets 3 times daily Liver function profile be obtained

## 2013-11-22 NOTE — Progress Notes (Signed)
Quick Note:  Call pt and tell her labs are ok, No change in medications ______ 

## 2013-11-22 NOTE — Patient Instructions (Addendum)
Reduce prednisone to one every other day for 10days then STOP Stay on Esbriet  An overnight oxygen test at home will be done Lab work today for liver function testing Return 2 months

## 2013-11-23 ENCOUNTER — Telehealth: Payer: Self-pay | Admitting: Critical Care Medicine

## 2013-11-23 NOTE — Telephone Encounter (Signed)
Notes Recorded by Elsie Stain, MD on 11/22/2013 at 3:03 PM Call pt and tell her labs are ok, No change in medications   I spoke with patient about results and she verbalized understanding and had no questions

## 2013-11-30 ENCOUNTER — Telehealth: Payer: Self-pay | Admitting: Critical Care Medicine

## 2013-11-30 DIAGNOSIS — J8410 Pulmonary fibrosis, unspecified: Secondary | ICD-10-CM

## 2013-11-30 NOTE — Telephone Encounter (Signed)
Called, spoke with pt -  Informed her of ONO results and recs per Dr. Wright. She verbalized understanding and voiced no further questions or concerns at this time. 

## 2013-11-30 NOTE — Telephone Encounter (Signed)
Call pt and tell her ONO positive for desaturation She will need to stay on//use oxygen 2Liters QHS  Ok to be off daytime

## 2013-12-03 ENCOUNTER — Other Ambulatory Visit: Payer: Self-pay | Admitting: *Deleted

## 2013-12-03 NOTE — Telephone Encounter (Signed)
Received faxed refill request from [facility] for Esbriet 267 mg take 3 capsules po tid.  Rx was signed by Dr. Joya Gaskins.  I have faxed it back to [facility] at [phone].  I have placed form in scan folder and have documented this in refill section as historical med for tracing purposes.

## 2013-12-06 LAB — PULMONARY FUNCTION TEST
DL/VA % pred: 75 %
DL/VA: 3.63 ml/min/mmHg/L
DLCO unc % pred: 46 %
DLCO unc: 11.21 ml/min/mmHg
FEF 25-75 Post: 0.88 L/sec
FEF 25-75 Pre: 0.97 L/sec
FEF2575-%Change-Post: -9 %
FEF2575-%Pred-Post: 43 %
FEF2575-%Pred-Pre: 47 %
FEV1-%Change-Post: -7 %
FEV1-%Pred-Post: 46 %
FEV1-%Pred-Pre: 50 %
FEV1-Post: 0.97 L
FEV1-Pre: 1.04 L
FEV1FVC-%Change-Post: -7 %
FEV1FVC-%Pred-Pre: 105 %
FEV6-%Change-Post: 0 %
FEV6-%Pred-Post: 48 %
FEV6-%Pred-Pre: 48 %
FEV6-Post: 1.25 L
FEV6-Pre: 1.25 L
FEV6FVC-%Pred-Post: 103 %
FEV6FVC-%Pred-Pre: 103 %
FVC-%Change-Post: 0 %
FVC-%Pred-Post: 47 %
FVC-%Pred-Pre: 47 %
FVC-Post: 1.25 L
Post FEV1/FVC ratio: 78 %
Post FEV6/FVC ratio: 100 %
Pre FEV1/FVC ratio: 84 %
Pre FEV6/FVC Ratio: 100 %
RV % pred: 49 %
RV: 0.98 L
TLC % pred: 43 %
TLC: 2.22 L

## 2013-12-20 ENCOUNTER — Telehealth: Payer: Self-pay | Admitting: Critical Care Medicine

## 2013-12-20 NOTE — Telephone Encounter (Signed)
predniSONE (DELTASONE) 10 MG tablet [[id number]]  Order Details    Dose, Route, Frequency: As Directed    Dispense Quantity: 30 tablet Refills: 1 Fills Remaining: 1          Sig: Reduce to one every other day for 10days then STOP         Written Date: 11/22/13 Expiration Date: 11/22/14     Start Date: 11/22/13 End Date: --     Ordering Provider: -- Authorizing Provider: Elsie Stain, MD Ordering User: Elsie Stain, MD    Will need to send to PW to advise if okay to refill as it appears he wanted patient to stop this rx. Spoke with patient; she states she is taking the prednisone at 1 po QOD and is unaware if she should stop the prednisone. Just to clarify we will send to PW for final say.   PW please advise if you want patient to stop the prednisone all together or continue at 1 po qod-pt is confused. Thanks.

## 2013-12-21 NOTE — Telephone Encounter (Signed)
Please stop prednisone at this time

## 2013-12-21 NOTE — Telephone Encounter (Signed)
Spoke with pt and advised of Dr Bettina Gavia instructions to stop Prednisone.  Pt verbalized understanding.

## 2014-01-12 ENCOUNTER — Encounter: Payer: Self-pay | Admitting: Critical Care Medicine

## 2014-01-19 ENCOUNTER — Ambulatory Visit (INDEPENDENT_AMBULATORY_CARE_PROVIDER_SITE_OTHER): Payer: BC Managed Care – PPO | Admitting: Critical Care Medicine

## 2014-01-19 ENCOUNTER — Encounter: Payer: Self-pay | Admitting: Critical Care Medicine

## 2014-01-19 ENCOUNTER — Other Ambulatory Visit (INDEPENDENT_AMBULATORY_CARE_PROVIDER_SITE_OTHER): Payer: BC Managed Care – PPO

## 2014-01-19 VITALS — BP 122/76 | HR 79 | Temp 97.8°F | Ht 64.0 in | Wt 148.2 lb

## 2014-01-19 DIAGNOSIS — K219 Gastro-esophageal reflux disease without esophagitis: Secondary | ICD-10-CM

## 2014-01-19 DIAGNOSIS — T50905S Adverse effect of unspecified drugs, medicaments and biological substances, sequela: Secondary | ICD-10-CM

## 2014-01-19 DIAGNOSIS — J8410 Pulmonary fibrosis, unspecified: Secondary | ICD-10-CM

## 2014-01-19 LAB — CBC
HEMATOCRIT: 34.8 % — AB (ref 36.0–46.0)
HEMOGLOBIN: 11.3 g/dL — AB (ref 12.0–15.0)
MCHC: 32.3 g/dL (ref 30.0–36.0)
MCV: 99.3 fl (ref 78.0–100.0)
Platelets: 331 10*3/uL (ref 150.0–400.0)
RBC: 3.5 Mil/uL — ABNORMAL LOW (ref 3.87–5.11)
RDW: 13 % (ref 11.5–15.5)
WBC: 6.9 10*3/uL (ref 4.0–10.5)

## 2014-01-19 LAB — HEPATIC FUNCTION PANEL
ALBUMIN: 3.4 g/dL — AB (ref 3.5–5.2)
ALK PHOS: 46 U/L (ref 39–117)
ALT: 9 U/L (ref 0–35)
AST: 19 U/L (ref 0–37)
BILIRUBIN DIRECT: 0.1 mg/dL (ref 0.0–0.3)
Total Bilirubin: 0.5 mg/dL (ref 0.2–1.2)
Total Protein: 7.3 g/dL (ref 6.0–8.3)

## 2014-01-19 MED ORDER — OMEPRAZOLE 20 MG PO CPDR: 20.0000 mg | DELAYED_RELEASE_CAPSULE | Freq: Every day | ORAL | Status: DC

## 2014-01-19 NOTE — Progress Notes (Signed)
Quick Note:  Call pt and tell her labs are ok, No change in medications ______ 

## 2014-01-19 NOTE — Patient Instructions (Signed)
Start omeprazole one daily , take 1/2 hour before meal. Follow reflux diet No other medication changes Labs today Return 3 months

## 2014-01-19 NOTE — Assessment & Plan Note (Signed)
Ongoing acute flare GERD likely exac lung disease Plan  Start omeprazole 20mg  daily GERD diet

## 2014-01-19 NOTE — Progress Notes (Signed)
Subjective:    Patient ID: Courtney Faulkner, female    DOB: Jun 03, 1951, 63 y.o.   MRN: [id number]  HPI  11/22/2013 Chief Complaint  Patient presents with  . Pulmonary Fibrosis    Breathing has good days and bad. When starting Pirfenidone breathing was causing problems, that has since resolved.   Pt better since on pirfenidone 11/04/13.   No mucus now. Less dyspneic.  No nausea, on 3 tid pirfenidone.   No chest pain noted.    01/19/2014 Chief Complaint  Patient presents with  . 2 month follow up    Having difficulty catching breath over the past week.  This seems to be all the time.  No wheezing, chest tightness/pain, or cough.  Pt worse over the past week.  No real cough.  Diff with dyspnea with any exertion.  No real edema in feet.  No chest pain.  No wheeze.  Only on oxygen 2L qhs and with exertion.  ? Gas excess No real belching .  Notes more heartburn  Review of Systems  Constitutional: Positive for activity change and unexpected weight change. Negative for chills, diaphoresis, appetite change and fatigue.  HENT: Negative for congestion, dental problem, ear discharge, facial swelling, hearing loss, mouth sores, nosebleeds, postnasal drip, sinus pressure, sneezing, tinnitus, trouble swallowing and voice change.   Eyes: Negative for photophobia, discharge, itching and visual disturbance.  Respiratory: Positive for cough. Negative for apnea, choking, chest tightness and stridor.   Cardiovascular: Negative for palpitations.  Gastrointestinal: Negative for nausea, constipation, blood in stool and abdominal distention.  Genitourinary: Negative for dysuria, urgency, frequency, hematuria, flank pain, decreased urine volume and difficulty urinating.  Musculoskeletal: Negative for arthralgias, back pain, gait problem, joint swelling, myalgias and neck stiffness.  Skin: Negative for color change and pallor.  Neurological: Positive for speech difficulty. Negative for dizziness, tremors,  seizures, syncope, weakness, light-headedness and numbness.  Hematological: Negative for adenopathy. Does not bruise/bleed easily.  Psychiatric/Behavioral: Positive for sleep disturbance. Negative for confusion and agitation. The patient is not nervous/anxious.        Objective:   Physical Exam  Filed Vitals:   01/19/14 1005  BP: 122/76  Pulse: 79  Temp: 97.8 F (36.6 C)  TempSrc: Oral  Height: 5\' 4"  (1.626 m)  Weight: 148 lb 3.2 oz (67.223 kg)  SpO2: 94%    Gen: Pleasant, well-nourished, in no distress,  normal affect  ENT: No lesions,  mouth clear,  oropharynx clear, no postnasal drip  Neck: No JVD, no TMG, no carotid bruits  Lungs: No use of accessory muscles, no dullness to percussion, dry rales with fair airflow  Cardiovascular: RRR, heart sounds normal, no murmur or gallops, no peripheral edema  Abdomen:moderate epigastric pain   Musculoskeletal: No deformities, no cyanosis or clubbing  Neuro: alert, non focal  Skin: Warm, no lesions or rashes       Assessment & Plan:   GERD (gastroesophageal reflux disease) Ongoing acute flare GERD likely exac lung disease Plan  Start omeprazole 20mg  daily GERD diet   Postinflammatory pulmonary fibrosis UIP/IPF on esbriet Plan Cont esbriet Hepatic function and cbc     Updated Medication List Outpatient Encounter Prescriptions as of 01/19/2014  Medication Sig  . aspirin 81 MG tablet Take 81 mg by mouth daily.  . calcium-vitamin D (OSCAL WITH D) 250-125 MG-UNIT per tablet Take 1 tablet by mouth 2 (two) times daily.   . metoprolol tartrate (LOPRESSOR) 25 MG tablet Take 1 tablet (25 mg total) by mouth 2 (  two) times daily.  . Multiple Vitamin (MULTIVITAMIN) tablet Take 1 tablet by mouth daily.  . Pirfenidone (ESBRIET) 267 MG CAPS Take as directed - 3 capsules by mouth tid with food  . omeprazole (PRILOSEC) 20 MG capsule Take 1 capsule (20 mg total) by mouth daily.  . [DISCONTINUED] predniSONE (DELTASONE) 10 MG  tablet Reduce to one every other day for 10days then STOP

## 2014-01-19 NOTE — Assessment & Plan Note (Signed)
UIP/IPF on esbriet Plan Cont esbriet Hepatic function and cbc

## 2014-01-20 ENCOUNTER — Telehealth: Payer: Self-pay | Admitting: Critical Care Medicine

## 2014-01-20 NOTE — Telephone Encounter (Signed)
Spoke with pt and advised of lab results per Dr Joya Gaskins

## 2014-01-20 NOTE — Progress Notes (Signed)
Quick Note:  lmomtcb for pt on home and cell #s. Will also send results through [facility]. ______

## 2014-02-09 ENCOUNTER — Ambulatory Visit (INDEPENDENT_AMBULATORY_CARE_PROVIDER_SITE_OTHER): Payer: BC Managed Care – PPO | Admitting: Interventional Cardiology

## 2014-02-09 ENCOUNTER — Encounter: Payer: Self-pay | Admitting: Interventional Cardiology

## 2014-02-09 VITALS — BP 130/80 | HR 84 | Ht 64.0 in | Wt 141.0 lb

## 2014-02-09 DIAGNOSIS — I6350 Cerebral infarction due to unspecified occlusion or stenosis of unspecified cerebral artery: Secondary | ICD-10-CM

## 2014-02-09 DIAGNOSIS — I639 Cerebral infarction, unspecified: Secondary | ICD-10-CM

## 2014-02-09 DIAGNOSIS — G8114 Spastic hemiplegia affecting left nondominant side: Secondary | ICD-10-CM

## 2014-02-09 DIAGNOSIS — D151 Benign neoplasm of heart: Secondary | ICD-10-CM

## 2014-02-09 DIAGNOSIS — G8110 Spastic hemiplegia affecting unspecified side: Secondary | ICD-10-CM

## 2014-02-09 NOTE — Progress Notes (Signed)
Patient ID: Courtney Faulkner, female   DOB: Nov 15, 1950, 63 y.o.   MRN: [id number]    [address], [address], [place]  [address] Phone: [phone] Fax:  [phone]  Date:  02/09/2014   ID:  Courtney Faulkner, DOB 11-27-1950, MRN [id number]  PCP:  Sinclair Grooms, MD   ASSESSMENT:  1. Left atrial myxoma status post resection 2013 2. History of CVA related to emboli from the left atrial myxoma 3. Post inflammatory pulmonary fibrosis  PLAN:  1. Clinical followup in one year 2. 2-D Doppler echocardiogram in one year 3. Call if cardiovascular complaints   SUBJECTIVE: Courtney Faulkner is a 63 y.o. female who has had no specific cardiac symptoms and denies complaints. She has not had palpitations or syncope. She denies edema.   Wt Readings from Last 3 Encounters:  02/09/14 141 lb (63.957 kg)  01/19/14 148 lb 3.2 oz (67.223 kg)  11/22/13 146 lb (66.225 kg)     Past Medical History  Diagnosis Date  . ICAO (internal carotid artery occlusion), left 12/15/2011  . 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 CVA (cerebral infarction) 12/15/2011  . Postinflammatory pulmonary fibrosis   . Stroke     Current Outpatient Prescriptions  Medication Sig Dispense Refill  . aspirin 81 MG tablet Take 81 mg by mouth daily.      . metoprolol tartrate (LOPRESSOR) 25 MG tablet Take 1 tablet (25 mg total) by mouth 2 (two) times daily.  60 tablet  1  . Multiple Vitamin (MULTIVITAMIN) tablet Take 1 tablet by mouth daily.      . Pirfenidone (ESBRIET) 267 MG CAPS Take as directed - 3 capsules by mouth tid with food  270 capsule  11   No current facility-administered medications for this visit.    Allergies:   No Known Allergies  Social History:  The patient  reports that she quit smoking about 12 years ago. Her smoking use included Cigarettes. She has a 10 pack-year smoking history. She has never used smokeless tobacco. She reports that she drinks about .6 ounces of alcohol per week. She reports that  she does not use illicit drugs.   ROS:  Please see the history of present illness.   Denies edema no orthopnea   All other systems reviewed and negative.   OBJECTIVE: VS:  BP 130/80  Pulse 84  Ht 5\' 4"  (1.626 m)  Wt 141 lb (63.957 kg)  BMI 24.19 kg/m2 Well nourished, well developed, in no acute distress, slender and appears older than stated age 69: normal Neck: JVD flat. Carotid bruit absent  Cardiac:  normal S1, S2; RRR; no murmur Lungs:  clear to auscultation bilaterally, no wheezing, rhonchi or rales Abd: soft, nontender, no hepatomegaly Ext: Edema absent. Pulses 2+ Skin: warm and dry Neuro:  CNs 2-12 intact, no focal abnormalities noted  EKG:  Normal sinus rhythm, biatrial abnormality, left axis deviation       Signed, Illene Labrador III, MD 02/09/2014 12:06 PM

## 2014-02-09 NOTE — Patient Instructions (Signed)
Your physician recommends that you continue on your current medications as directed. Please refer to the Current Medication list given to you today.  Your physician has requested that you have an echocardiogram. Echocardiography is a painless test that uses sound waves to create images of your heart. It provides your doctor with information about the size and shape of your heart and how well your heart's chambers and valves are working. This procedure takes approximately one hour. There are no restrictions for this procedure. (To be scheduled for June 2016)  Your physician wants you to follow-up in: 1 year with Dr.Smith You will receive a reminder letter in the mail two months in advance. If you don't receive a letter, please call our office to schedule the follow-up appointment.

## 2014-04-22 ENCOUNTER — Other Ambulatory Visit (INDEPENDENT_AMBULATORY_CARE_PROVIDER_SITE_OTHER): Payer: BC Managed Care – PPO

## 2014-04-22 ENCOUNTER — Ambulatory Visit (INDEPENDENT_AMBULATORY_CARE_PROVIDER_SITE_OTHER): Payer: BC Managed Care – PPO | Admitting: Critical Care Medicine

## 2014-04-22 ENCOUNTER — Encounter: Payer: Self-pay | Admitting: Critical Care Medicine

## 2014-04-22 VITALS — BP 120/82 | HR 91 | Temp 96.9°F | Ht 64.0 in | Wt 135.0 lb

## 2014-04-22 DIAGNOSIS — T50905S Adverse effect of unspecified drugs, medicaments and biological substances, sequela: Secondary | ICD-10-CM

## 2014-04-22 DIAGNOSIS — Z23 Encounter for immunization: Secondary | ICD-10-CM

## 2014-04-22 DIAGNOSIS — J8410 Pulmonary fibrosis, unspecified: Secondary | ICD-10-CM

## 2014-04-22 LAB — HEPATIC FUNCTION PANEL
ALBUMIN: 3.7 g/dL (ref 3.5–5.2)
ALK PHOS: 62 U/L (ref 39–117)
ALT: 12 U/L (ref 0–35)
AST: 22 U/L (ref 0–37)
Bilirubin, Direct: 0.1 mg/dL (ref 0.0–0.3)
TOTAL PROTEIN: 7.8 g/dL (ref 6.0–8.3)
Total Bilirubin: 0.4 mg/dL (ref 0.2–1.2)

## 2014-04-22 LAB — CBC
HCT: 37.3 % (ref 36.0–46.0)
Hemoglobin: 12.2 g/dL (ref 12.0–15.0)
MCHC: 32.7 g/dL (ref 30.0–36.0)
MCV: 96.8 fl (ref 78.0–100.0)
PLATELETS: 313 10*3/uL (ref 150.0–400.0)
RBC: 3.86 Mil/uL — ABNORMAL LOW (ref 3.87–5.11)
RDW: 13.9 % (ref 11.5–15.5)
WBC: 5.8 10*3/uL (ref 4.0–10.5)

## 2014-04-22 NOTE — Assessment & Plan Note (Signed)
Pulmonary fibrosis with nocturnal oxygen desaturation Plan Maintain oxygen 2 L at bedtime Maintain pirfenidone Obtain drug toxicity labs on this visit Administer Prevnar 13 vaccine

## 2014-04-22 NOTE — Patient Instructions (Signed)
Labs today Flu vaccine and prevnar 13 was given No change in medications Return 3 months

## 2014-04-22 NOTE — Progress Notes (Signed)
Quick Note:  Call pt and tell her labs are ok, No change in medications ______ 

## 2014-04-22 NOTE — Progress Notes (Signed)
  Subjective:    Patient ID: Courtney Faulkner, female    DOB: [birth year], 63 y.o.   MRN: [id number]  HPI  04/22/2014 Chief Complaint  Patient presents with  . Follow-up    Pt denies any increase in SOB, cough, congestion at this time.   No real cough. No change in dyspnea, is stable. No qhs dyspnea. No edema in feet.  Heartburn is better.    Review of Systems  Constitutional: Positive for activity change and unexpected weight change. Negative for chills, diaphoresis, appetite change and fatigue.  HENT: Negative for congestion, dental problem, ear discharge, facial swelling, hearing loss, mouth sores, nosebleeds, postnasal drip, sinus pressure, sneezing, tinnitus, trouble swallowing and voice change.   Eyes: Negative for photophobia, discharge, itching and visual disturbance.  Respiratory: Negative for apnea, cough, choking, chest tightness and stridor.   Cardiovascular: Negative for palpitations.  Gastrointestinal: Negative for nausea, constipation, blood in stool and abdominal distention.  Genitourinary: Negative for dysuria, urgency, frequency, hematuria, flank pain, decreased urine volume and difficulty urinating.  Musculoskeletal: Negative for arthralgias, back pain, gait problem, joint swelling, myalgias and neck stiffness.  Skin: Negative for color change and pallor.  Neurological: Positive for speech difficulty. Negative for dizziness, tremors, seizures, syncope, weakness, light-headedness and numbness.  Hematological: Negative for adenopathy. Does not bruise/bleed easily.  Psychiatric/Behavioral: Positive for sleep disturbance. Negative for confusion and agitation. The patient is not nervous/anxious.        Objective:   Physical Exam  Filed Vitals:   04/22/14 1019  BP: 120/82  Pulse: 91  Temp: 96.9 F (36.1 C)  TempSrc: Oral  Height: 5\' 4"  (1.626 m)  Weight: 135 lb (61.236 kg)  SpO2: 96%    Gen: Pleasant, well-nourished, in no distress,  normal affect  ENT: No  lesions,  mouth clear,  oropharynx clear, no postnasal drip  Neck: No JVD, no TMG, no carotid bruits  Lungs: No use of accessory muscles, no dullness to percussion, dry rales with fair airflow  Cardiovascular: RRR, heart sounds normal, no murmur or gallops, no peripheral edema  Abdomen:moderate epigastric pain   Musculoskeletal: No deformities, no cyanosis or clubbing  Neuro: alert, non focal  Skin: Warm, no lesions or rashes       Assessment & Plan:   Postinflammatory pulmonary fibrosis Pulmonary fibrosis with nocturnal oxygen desaturation Plan Maintain oxygen 2 L at bedtime Maintain pirfenidone Obtain drug toxicity labs on this visit Administer Prevnar 13 vaccine    Updated Medication List Outpatient Encounter Prescriptions as of 04/22/2014  Medication Sig  . aspirin 81 MG tablet Take 81 mg by mouth daily.  . metoprolol tartrate (LOPRESSOR) 25 MG tablet Take 1 tablet (25 mg total) by mouth 2 (two) times daily.  . Multiple Vitamin (MULTIVITAMIN) tablet Take 1 tablet by mouth daily.  . Pirfenidone (ESBRIET) 267 MG CAPS Take as directed - 3 capsules by mouth three times a day with food

## 2014-04-25 NOTE — Progress Notes (Signed)
Quick Note:  Called, spoke with pt. Informed her of lab results and recs per Dr. Wright. She verbalized understanding and voiced no further questions or concerns at this time. ______ 

## 2014-05-05 ENCOUNTER — Other Ambulatory Visit (HOSPITAL_COMMUNITY): Payer: Self-pay | Admitting: Internal Medicine

## 2014-05-05 ENCOUNTER — Telehealth: Payer: Self-pay | Admitting: Critical Care Medicine

## 2014-05-05 DIAGNOSIS — Z1231 Encounter for screening mammogram for malignant neoplasm of breast: Secondary | ICD-10-CM

## 2014-05-05 NOTE — Telephone Encounter (Signed)
Called pt. She is scheduled to come in next Thursday. Nothing further needed

## 2014-05-05 NOTE — Telephone Encounter (Signed)
Per 04/22/14 OV;  Patient Instructions      Labs today Flu vaccine and prevnar 13 was given No change in medications Return 3 months   Was pt not given flu vaccine at OV? If not, then okay to add on to injection schedule to have this done or can be done when she follows up in november Called pt LMTCB x1

## 2014-05-05 NOTE — Telephone Encounter (Signed)
Pt returning call- [phone]

## 2014-05-12 ENCOUNTER — Ambulatory Visit (INDEPENDENT_AMBULATORY_CARE_PROVIDER_SITE_OTHER): Payer: BC Managed Care – PPO

## 2014-05-12 DIAGNOSIS — Z23 Encounter for immunization: Secondary | ICD-10-CM

## 2014-05-25 ENCOUNTER — Ambulatory Visit: Payer: BC Managed Care – PPO

## 2014-06-01 ENCOUNTER — Ambulatory Visit (HOSPITAL_COMMUNITY): Payer: BC Managed Care – PPO

## 2014-06-02 ENCOUNTER — Ambulatory Visit
Admission: RE | Admit: 2014-06-02 | Discharge: 2014-06-02 | Disposition: A | Discharge status: Home / Self Care | Payer: BC Managed Care – PPO | Source: Ambulatory Visit | Attending: Internal Medicine | Admitting: Internal Medicine

## 2014-06-02 DIAGNOSIS — Z1231 Encounter for screening mammogram for malignant neoplasm of breast: Secondary | ICD-10-CM | POA: Diagnosis not present

## 2014-06-17 ENCOUNTER — Ambulatory Visit: Payer: BC Managed Care – PPO | Admitting: Nurse Practitioner

## 2014-06-21 ENCOUNTER — Encounter: Payer: Self-pay | Admitting: Nurse Practitioner

## 2014-06-21 ENCOUNTER — Ambulatory Visit (INDEPENDENT_AMBULATORY_CARE_PROVIDER_SITE_OTHER): Payer: BC Managed Care – PPO | Admitting: Nurse Practitioner

## 2014-06-21 VITALS — BP 124/75 | HR 77 | Ht 64.0 in | Wt 134.0 lb

## 2014-06-21 DIAGNOSIS — I6522 Occlusion and stenosis of left carotid artery: Secondary | ICD-10-CM

## 2014-06-21 DIAGNOSIS — G8110 Spastic hemiplegia affecting unspecified side: Secondary | ICD-10-CM | POA: Diagnosis not present

## 2014-06-21 DIAGNOSIS — G8114 Spastic hemiplegia affecting left nondominant side: Secondary | ICD-10-CM

## 2014-06-21 NOTE — Progress Notes (Signed)
PATIENT: Courtney Faulkner DOB: 02/08/1951  REASON FOR VISIT: routine follow up for stroke HISTORY FROM: patient  HISTORY OF PRESENT ILLNESS: 63 year African American female with history of left MCA infarct from left carotid occlusion secondary to left atrial myxoma. In April 2013 History of atrial fibrillation status post-surgical ablation and remote smoking quit in 2003.   She returns for followup of her last visit on 62/23/14 with me. She has not had any recurrent stroke or TIA symptoms since April 2013. She is tolerating daily aspirin well without bleeding or side effects. She continues to have mild right hand weakness as well as drags her right leg slightly when walking. She has had no recent falls. She states her blood pressure has been under good control, it is 124/75 in the office today. She is on a new medication for her pulmonary fibrosis which she thinks is helping; she only wears oxygen at night. Her last carotid duplex was in May 2014 which showed total left ICA occlusion. She has no neurovascular complaints.   ROS:  14 system review of systems is positive for nothing, no complaints.  ALLERGIES: No Known Allergies  HOME MEDICATIONS: Outpatient Prescriptions Prior to Visit  Medication Sig Dispense Refill  . aspirin 81 MG tablet Take 81 mg by mouth daily.      . metoprolol tartrate (LOPRESSOR) 25 MG tablet Take 1 tablet (25 mg total) by mouth 2 (two) times daily.  60 tablet  1  . Multiple Vitamin (MULTIVITAMIN) tablet Take 1 tablet by mouth daily.      . Pirfenidone (ESBRIET) 267 MG CAPS Take as directed - 3 capsules by mouth three times a day with food  270 capsule  11   No facility-administered medications prior to visit.    PHYSICAL EXAM Filed Vitals:   06/21/14 1408  BP: 124/75  Pulse: 77  Height: 5\' 4"  (1.626 m)  Weight: 134 lb (60.782 kg)   Body mass index is 22.99 kg/(m^2).  General: well developed, well nourished, seated, in no evident distress  Head:  head normocephalic and atraumatic. Orohparynx benign  Neck: supple with no carotid or supraclavicular bruits  Cardiovascular: regular rate and rhythm, no murmurs  Musculoskeletal: no deformity  Skin: no rash/petichiae  Vascular: Normal pulses all extremities   Neurologic Exam  Mental Status: Awake and fully alert. Oriented to place and time. Recent and remote memory intact. Attention span, concentration and fund of knowledge appropriate. Mood and affect appropriate.  Cranial Nerves: Pupils equal, briskly reactive to light. Extraocular movements full without nystagmus. Visual fields full to confrontation. Hearing intact. Facial sensation intact. Face, tongue, palate moves normally and symmetrically.  Motor: Normal bulk and tone. Normal strength in all tested extremity muscles.  mild right facial droop as well as diminished fine motor skills on the right and orbits left over right upper extremity. Mild weakness of ankle dorsiflexors and right grip  Sensory: intact to light touch in all 4 extremities Coordination: Rapid alternating movements normal in all extremities. Finger-to-nose and heel-to-shin performed accurately bilaterally.  Gait and Station: Arises from chair without difficulty. Stance is normal. Gait demonstrates normal stride length. Able to heel, toe walk without difficulty. Tandem gait is unsteady. Reflexes: 1+ and symmetric. Toes downgoing.   ASSESSMENT: 63 year old female has a past medical history of ICAO (internal carotid artery occlusion), left (12/15/2011); Atrial myxoma (12/17/2011); CVA (cerebral infarction) (12/15/2011); Postinflammatory pulmonary fibrosis; here for stroke follow up.   PLAN:  Continue Aspirin 81 mg  for stroke prevention and strict control of hypertension with blood pressure goal below 130/90.  She does not want to schedule carotid doppler at this time, wants to defer until next year. Return for followup in 1 year with Dr. Leonie Man, sooner as needed.  Rudi Rummage  Loraine Freid, MSN, FNP-BC, A/GNP-C 06/21/2014, 2:14 PM [facility] [address], [place], [place] [address] [phone]  Note: This document was prepared with digital dictation and possible smart phrase technology. Any transcriptional errors that result from this process are unintentional.

## 2014-06-21 NOTE — Patient Instructions (Addendum)
Continue Aspirin 81 mg for stroke prevention and strict control of hypertension with blood pressure goal below 130/90. We will repeat a carotid doppler study to be done in our office, someone will call you to schedule. Return for followup in 1 year with Dr. Leonie Man, sooner as needed.   Stroke Prevention Some medical conditions and behaviors are associated with an increased chance of having a stroke. You may prevent a stroke by making healthy choices and managing medical conditions. HOW CAN I REDUCE MY RISK OF HAVING A STROKE?   Stay physically active. Get at least 30 minutes of activity on most or all days.  Do not smoke. It may also be helpful to avoid exposure to secondhand smoke.  Limit alcohol use. Moderate alcohol use is considered to be:  No more than 2 drinks per day for men.  No more than 1 drink per day for nonpregnant women.  Eat healthy foods. This involves:  Eating 5 or more servings of fruits and vegetables a day.  Making dietary changes that address high blood pressure (hypertension), high cholesterol, diabetes, or obesity.  Manage your cholesterol levels.  Making food choices that are high in fiber and low in saturated fat, trans fat, and cholesterol may control cholesterol levels.  Take any prescribed medicines to control cholesterol as directed by your health care provider.  Manage your diabetes.  Controlling your carbohydrate and sugar intake is recommended to manage diabetes.  Take any prescribed medicines to control diabetes as directed by your health care provider.  Control your hypertension.  Making food choices that are low in salt (sodium), saturated fat, trans fat, and cholesterol is recommended to manage hypertension.  Take any prescribed medicines to control hypertension as directed by your health care provider.  Maintain a healthy weight.  Reducing calorie intake and making food choices that are low in sodium, saturated fat, trans fat, and  cholesterol are recommended to manage weight.  Stop drug abuse.  Avoid taking birth control pills.  Talk to your health care provider about the risks of taking birth control pills if you are over 48 years old, smoke, get migraines, or have ever had a blood clot.  Get evaluated for sleep disorders (sleep apnea).  Talk to your health care provider about getting a sleep evaluation if you snore a lot or have excessive sleepiness.  Take medicines only as directed by your health care provider.  For some people, aspirin or blood thinners (anticoagulants) are helpful in reducing the risk of forming abnormal blood clots that can lead to stroke. If you have the irregular heart rhythm of atrial fibrillation, you should be on a blood thinner unless there is a good reason you cannot take them.  Understand all your medicine instructions.  Make sure that other conditions (such as anemia or atherosclerosis) are addressed. SEEK IMMEDIATE MEDICAL CARE IF:   You have sudden weakness or numbness of the face, arm, or leg, especially on one side of the body.  Your face or eyelid droops to one side.  You have sudden confusion.  You have trouble speaking (aphasia) or understanding.  You have sudden trouble seeing in one or both eyes.  You have sudden trouble walking.  You have dizziness.  You have a loss of balance or coordination.  You have a sudden, severe headache with no known cause.  You have new chest pain or an irregular heartbeat. Any of these symptoms may represent a serious problem that is an emergency. Do not wait to  see if the symptoms will go away. Get medical help at once. Call your local emergency services (911 in [place] Do not drive yourself to the hospital. Document Released: 09/19/2004 Document Revised: 12/27/2013 Document Reviewed: 02/12/2013 [facility] Patient Information 2015 [place], [place]. This information is not intended to replace advice given to you by your health care  provider. Make sure you discuss any questions you have with your health care provider.

## 2014-06-22 NOTE — Progress Notes (Signed)
I agree with the above plan 

## 2014-07-25 ENCOUNTER — Other Ambulatory Visit (INDEPENDENT_AMBULATORY_CARE_PROVIDER_SITE_OTHER): Payer: BC Managed Care – PPO

## 2014-07-25 ENCOUNTER — Encounter: Payer: Self-pay | Admitting: Critical Care Medicine

## 2014-07-25 ENCOUNTER — Ambulatory Visit (INDEPENDENT_AMBULATORY_CARE_PROVIDER_SITE_OTHER): Payer: Medicare Other | Admitting: Critical Care Medicine

## 2014-07-25 ENCOUNTER — Other Ambulatory Visit: Payer: BC Managed Care – PPO

## 2014-07-25 VITALS — BP 118/66 | HR 83 | Ht 64.0 in | Wt 135.2 lb

## 2014-07-25 DIAGNOSIS — J84112 Idiopathic pulmonary fibrosis: Secondary | ICD-10-CM

## 2014-07-25 DIAGNOSIS — T50905S Adverse effect of unspecified drugs, medicaments and biological substances, sequela: Secondary | ICD-10-CM

## 2014-07-25 DIAGNOSIS — J8410 Pulmonary fibrosis, unspecified: Secondary | ICD-10-CM

## 2014-07-25 LAB — CBC
HCT: 34.6 % — ABNORMAL LOW (ref 36.0–46.0)
HEMOGLOBIN: 11.5 g/dL — AB (ref 12.0–15.0)
MCHC: 33.1 g/dL (ref 30.0–36.0)
MCV: 97.5 fl (ref 78.0–100.0)
Platelets: 295 10*3/uL (ref 150.0–400.0)
RBC: 3.55 Mil/uL — ABNORMAL LOW (ref 3.87–5.11)
RDW: 12.9 % (ref 11.5–15.5)
WBC: 6.1 10*3/uL (ref 4.0–10.5)

## 2014-07-25 LAB — HEPATIC FUNCTION PANEL
ALT: 12 U/L (ref 0–35)
AST: 19 U/L (ref 0–37)
Albumin: 3.7 g/dL (ref 3.5–5.2)
Alkaline Phosphatase: 73 U/L (ref 39–117)
Bilirubin, Direct: 0 mg/dL (ref 0.0–0.3)
Total Bilirubin: 0.4 mg/dL (ref 0.2–1.2)
Total Protein: 7.1 g/dL (ref 6.0–8.3)

## 2014-07-25 NOTE — Progress Notes (Signed)
Subjective:    Patient ID: Courtney Faulkner, female    DOB: 12-Mar-1951, 63 y.o.   MRN: [id number]  HPI 07/25/2014 Chief Complaint  Patient presents with  . 3 month follow up    Breathing doing well overall.  No SOB, wheezing, chest tightness/CP, or cough at this time.  Notes improved dyspnea.  No real cough.  No chest pain.  No qhs dyspnea.  Pt did get flu vaccine.  The patient has no other new complaints Pt denies any significant sore throat, nasal congestion or excess secretions, fever, chills, sweats, unintended weight loss, pleurtic or exertional chest pain, orthopnea PND, or leg swelling Pt denies any increase in rescue therapy over baseline, denies waking up needing it or having any early am or nocturnal exacerbations of coughing/wheezing/or dyspnea. Pt also denies any obvious fluctuation in symptoms with  weather or environmental change or other alleviating or aggravating factors   Wt Readings from Last 3 Encounters:  07/25/14 135 lb 3.2 oz (61.326 kg)  06/21/14 134 lb (60.782 kg)  04/22/14 135 lb (61.236 kg)    Review of Systems  Constitutional: Negative for chills, diaphoresis, appetite change, fatigue and unexpected weight change.  HENT: Negative for congestion, dental problem, ear discharge, facial swelling, hearing loss, mouth sores, nosebleeds, postnasal drip, sinus pressure, sneezing, tinnitus, trouble swallowing and voice change.   Eyes: Negative for photophobia, discharge, itching and visual disturbance.  Respiratory: Negative for apnea, cough, choking, chest tightness and stridor.   Cardiovascular: Negative for palpitations.  Gastrointestinal: Negative for nausea, constipation, blood in stool and abdominal distention.  Genitourinary: Negative for dysuria, urgency, frequency, hematuria, flank pain, decreased urine volume and difficulty urinating.  Musculoskeletal: Negative for myalgias, back pain, joint swelling, arthralgias, gait problem and neck stiffness.  Skin:  Negative for color change and pallor.  Neurological: Positive for speech difficulty. Negative for dizziness, tremors, seizures, syncope, weakness, light-headedness and numbness.  Hematological: Negative for adenopathy. Does not bruise/bleed easily.  Psychiatric/Behavioral: Positive for sleep disturbance. Negative for confusion and agitation. The patient is not nervous/anxious.        Objective:   Physical Exam Filed Vitals:   07/25/14 1010  BP: 118/66  Pulse: 83  Height: 5\' 4"  (1.626 m)  Weight: 135 lb 3.2 oz (61.326 kg)  SpO2: 97%    Gen: Pleasant, well-nourished, in no distress,  normal affect  ENT: No lesions,  mouth clear,  oropharynx clear, no postnasal drip  Neck: No JVD, no TMG, no carotid bruits  Lungs: No use of accessory muscles, no dullness to percussion, improved airflow with decreased rales  Cardiovascular: RRR, heart sounds normal, no murmur or gallops, no peripheral edema  Abdomen:moderate epigastric pain   Musculoskeletal: No deformities, no cyanosis or clubbing  Neuro: alert, non focal  Skin: Warm, no lesions or rashes      Assessment & Plan:   IPF (idiopathic pulmonary fibrosis) Idiopathic pulmonary fibrosis with basilar bronchiectasis questionably from microaspiration now markedly improved on pirfenidone. Note with exertion on room air the patient's saturation is normal at this visit 07/25/2014 Plan Maintain pirfenidone 3 tablets 3 times daily 267 mg     Maintain off oxygen during the daytime but continue 2 L oxygen at bedtime Obtain liver function profile and CBC at this visit Return 4 months  Note the patient requested a a copy of an advanced care plan at this visit    Updated Medication List Outpatient Encounter Prescriptions as of 07/25/2014  Medication Sig  . aspirin 81 MG tablet Take  81 mg by mouth daily.  . metoprolol tartrate (LOPRESSOR) 25 MG tablet Take 1 tablet (25 mg total) by mouth 2 (two) times daily.  . Multiple Vitamin  (MULTIVITAMIN) tablet Take 1 tablet by mouth daily.  . Pirfenidone (ESBRIET) 267 MG CAPS Take as directed - 3 capsules by mouth three times a day with food

## 2014-07-25 NOTE — Progress Notes (Signed)
Quick Note:  Call pt and tell her labs are ok, No change in medications ______ 

## 2014-07-25 NOTE — Patient Instructions (Addendum)
No change in medications, stay on pirfenidone, it is working great for you!! Labwork today: CBC, liver function profile Ok to be off oxygen daytime, still use oxygen at night  Return 4 months

## 2014-07-25 NOTE — Assessment & Plan Note (Signed)
Idiopathic pulmonary fibrosis with basilar bronchiectasis questionably from microaspiration now markedly improved on pirfenidone. Note with exertion on room air the patient's saturation is normal at this visit 07/25/2014 Plan Maintain pirfenidone 3 tablets 3 times daily 267 mg     Maintain off oxygen during the daytime but continue 2 L oxygen at bedtime Obtain liver function profile and CBC at this visit Return 4 months  Note the patient requested a a copy of an advanced care plan at this visit

## 2014-07-26 NOTE — Progress Notes (Signed)
Quick Note:  Called, spoke with pt. Discussed lab results and recs per Dr. Joya Gaskins. She verbalized understanding and voiced no further questions or concerns at this time. ______

## 2014-08-04 ENCOUNTER — Encounter (HOSPITAL_COMMUNITY): Payer: Self-pay | Admitting: Interventional Cardiology

## 2014-10-19 ENCOUNTER — Telehealth: Payer: Self-pay | Admitting: Critical Care Medicine

## 2014-10-19 NOTE — Telephone Encounter (Signed)
Received PA for Esbriet 267mg  through [employer]. PA submitted through [email or web address].  Key: [id number]  Will forward to Crystal to follow.

## 2014-10-20 NOTE — Telephone Encounter (Signed)
Esbriet has been approved from 09/20/14 - 10/20/2015 through [email or web address]. Nothing further needed.  Will send to Crystal as FYI.

## 2014-11-16 ENCOUNTER — Other Ambulatory Visit: Payer: Self-pay | Admitting: *Deleted

## 2014-11-16 MED ORDER — PIRFENIDONE 267 MG PO CAPS: ORAL_CAPSULE | ORAL | Status: DC

## 2014-11-25 DIAGNOSIS — J84112 Idiopathic pulmonary fibrosis: Secondary | ICD-10-CM | POA: Diagnosis not present

## 2014-11-25 DIAGNOSIS — Z1211 Encounter for screening for malignant neoplasm of colon: Secondary | ICD-10-CM | POA: Diagnosis not present

## 2014-11-25 DIAGNOSIS — Z0000 Encounter for general adult medical examination without abnormal findings: Secondary | ICD-10-CM | POA: Diagnosis not present

## 2014-11-25 DIAGNOSIS — Z1389 Encounter for screening for other disorder: Secondary | ICD-10-CM | POA: Diagnosis not present

## 2014-11-25 DIAGNOSIS — G8191 Hemiplegia, unspecified affecting right dominant side: Secondary | ICD-10-CM | POA: Diagnosis not present

## 2014-11-25 DIAGNOSIS — I6522 Occlusion and stenosis of left carotid artery: Secondary | ICD-10-CM | POA: Diagnosis not present

## 2014-11-28 ENCOUNTER — Other Ambulatory Visit (INDEPENDENT_AMBULATORY_CARE_PROVIDER_SITE_OTHER): Payer: Medicare Other

## 2014-11-28 ENCOUNTER — Encounter: Payer: Self-pay | Admitting: Critical Care Medicine

## 2014-11-28 ENCOUNTER — Ambulatory Visit (INDEPENDENT_AMBULATORY_CARE_PROVIDER_SITE_OTHER): Payer: Medicare Other | Admitting: Critical Care Medicine

## 2014-11-28 VITALS — BP 100/72 | HR 76 | Temp 98.5°F | Ht 64.0 in | Wt 131.0 lb

## 2014-11-28 DIAGNOSIS — J84112 Idiopathic pulmonary fibrosis: Secondary | ICD-10-CM | POA: Diagnosis not present

## 2014-11-28 DIAGNOSIS — L219 Seborrheic dermatitis, unspecified: Secondary | ICD-10-CM | POA: Diagnosis not present

## 2014-11-28 DIAGNOSIS — T50905S Adverse effect of unspecified drugs, medicaments and biological substances, sequela: Secondary | ICD-10-CM

## 2014-11-28 LAB — HEPATIC FUNCTION PANEL
ALBUMIN: 4 g/dL (ref 3.5–5.2)
ALT: 15 U/L (ref 0–35)
AST: 17 U/L (ref 0–37)
Alkaline Phosphatase: 78 U/L (ref 39–117)
BILIRUBIN DIRECT: 0.1 mg/dL (ref 0.0–0.3)
TOTAL PROTEIN: 7.7 g/dL (ref 6.0–8.3)
Total Bilirubin: 0.3 mg/dL (ref 0.2–1.2)

## 2014-11-28 NOTE — Progress Notes (Signed)
Subjective:    Patient ID: Courtney Faulkner, female    DOB: 1951-04-04, 64 y.o.   MRN: [id number]  HPI  11/28/2014 Chief Complaint  Patient presents with  . Follow-up    Pt states breathing is doing well. Pt denies any wheezing, SOB, prod cough, chest tightness/congestion.  Overall doing well.  The esbriet, forgets to take and occ makes pt ill during lunchtime.   Adherence issues.   Weight fluctuates.  No edema in feet .  No real cough , no wheezing Pt denies any significant sore throat, nasal congestion or excess secretions, fever, chills, sweats, unintended weight loss, pleurtic or exertional chest pain, orthopnea PND, or leg swelling Pt denies any increase in rescue therapy over baseline, denies waking up needing it or having any early am or nocturnal exacerbations of coughing/wheezing/or dyspnea. Pt also denies any obvious fluctuation in symptoms with  weather or environmental change or other alleviating or aggravating factors    Wt Readings from Last 3 Encounters:  11/28/14 131 lb (59.421 kg)  07/25/14 135 lb 3.2 oz (61.326 kg)  06/21/14 134 lb (60.782 kg)    Review of Systems  Constitutional: Negative for chills, diaphoresis, appetite change, fatigue and unexpected weight change.  HENT: Negative for congestion, dental problem, ear discharge, facial swelling, hearing loss, mouth sores, nosebleeds, postnasal drip, sinus pressure, sneezing, tinnitus, trouble swallowing and voice change.   Eyes: Negative for photophobia, discharge, itching and visual disturbance.  Respiratory: Negative for apnea, cough, choking, chest tightness and stridor.   Cardiovascular: Negative for palpitations.  Gastrointestinal: Negative for nausea, constipation, blood in stool and abdominal distention.  Genitourinary: Negative for dysuria, urgency, frequency, hematuria, flank pain, decreased urine volume and difficulty urinating.  Musculoskeletal: Negative for myalgias, back pain, joint swelling,  arthralgias, gait problem and neck stiffness.  Skin: Negative for color change and pallor.  Neurological: Negative for dizziness, tremors, seizures, syncope, speech difficulty, weakness, light-headedness and numbness.  Hematological: Negative for adenopathy. Does not bruise/bleed easily.  Psychiatric/Behavioral: Positive for sleep disturbance. Negative for confusion and agitation. The patient is not nervous/anxious.        Objective:   Physical Exam Filed Vitals:   11/28/14 1029  BP: 100/72  Pulse: 76  Temp: 98.5 F (36.9 C)  TempSrc: Oral  Height: 5\' 4"  (1.626 m)  Weight: 131 lb (59.421 kg)    Gen: Pleasant, well-nourished, in no distress,  normal affect  ENT: No lesions,  mouth clear,  oropharynx clear, no postnasal drip  Neck: No JVD, no TMG, no carotid bruits  Lungs: No use of accessory muscles, no dullness to percussion, improved airflow with decreased rales  Cardiovascular: RRR, heart sounds normal, no murmur or gallops, no peripheral edema  Abdomen:moderate epigastric pain   Musculoskeletal: No deformities, no cyanosis or clubbing  Neuro: alert, non focal  Skin: Warm, no lesions or rashes   Recent Labs Lab 11/28/14 1102  AST 17  ALT 15  ALKPHOS 78  BILITOT 0.3  PROT 7.7  ALBUMIN 4.0       Assessment & Plan:   IPF (idiopathic pulmonary fibrosis) IPF/UIP improved.  Less dyspnea, positive response to pirfenidone; No desaturation with exertion 11/28/2014  Plan Cont esbriet Note hep function panel normal      Updated Medication List Outpatient Encounter Prescriptions as of 11/28/2014  Medication Sig  . aspirin 81 MG tablet Take 81 mg by mouth daily.  . metoprolol tartrate (LOPRESSOR) 25 MG tablet Take 1 tablet (25 mg total) by mouth 2 (two) times  daily.  . Multiple Vitamin (MULTIVITAMIN) tablet Take 1 tablet by mouth daily.  . Pirfenidone (ESBRIET) 267 MG CAPS Take as directed - 3 capsules by mouth three times a day with food

## 2014-11-28 NOTE — Assessment & Plan Note (Signed)
IPF/UIP improved.  Less dyspnea, positive response to pirfenidone; No desaturation with exertion 11/28/2014  Plan Cont esbriet Note hep function panel normal

## 2014-11-28 NOTE — Progress Notes (Signed)
Quick Note:  Call pt and tell her labs are ok, No change in medications ______ 

## 2014-11-28 NOTE — Patient Instructions (Addendum)
Try to take midday esbriet dose later in after noon after a snack No changes in medications Liver function testing today Return 6 months

## 2015-01-03 ENCOUNTER — Other Ambulatory Visit: Payer: Self-pay | Admitting: Gastroenterology

## 2015-01-09 DIAGNOSIS — L219 Seborrheic dermatitis, unspecified: Secondary | ICD-10-CM | POA: Diagnosis not present

## 2015-02-02 ENCOUNTER — Encounter (HOSPITAL_COMMUNITY): Payer: Self-pay | Admitting: *Deleted

## 2015-02-09 ENCOUNTER — Other Ambulatory Visit: Payer: Self-pay | Admitting: Interventional Cardiology

## 2015-02-09 ENCOUNTER — Other Ambulatory Visit (HOSPITAL_COMMUNITY): Payer: BC Managed Care – PPO

## 2015-02-09 ENCOUNTER — Other Ambulatory Visit: Payer: Self-pay

## 2015-02-09 ENCOUNTER — Ambulatory Visit (HOSPITAL_COMMUNITY): Payer: Medicare Other | Attending: Internal Medicine

## 2015-02-09 ENCOUNTER — Other Ambulatory Visit (HOSPITAL_COMMUNITY): Payer: Medicare Other

## 2015-02-09 DIAGNOSIS — D151 Benign neoplasm of heart: Secondary | ICD-10-CM

## 2015-02-09 DIAGNOSIS — I071 Rheumatic tricuspid insufficiency: Secondary | ICD-10-CM | POA: Insufficient documentation

## 2015-02-09 DIAGNOSIS — I340 Nonrheumatic mitral (valve) insufficiency: Secondary | ICD-10-CM | POA: Diagnosis not present

## 2015-02-10 ENCOUNTER — Encounter: Payer: Self-pay | Admitting: Interventional Cardiology

## 2015-02-10 ENCOUNTER — Ambulatory Visit (INDEPENDENT_AMBULATORY_CARE_PROVIDER_SITE_OTHER): Payer: Medicare Other | Admitting: Interventional Cardiology

## 2015-02-10 VITALS — BP 118/82 | HR 69 | Ht 64.0 in | Wt 134.0 lb

## 2015-02-10 DIAGNOSIS — Z8673 Personal history of transient ischemic attack (TIA), and cerebral infarction without residual deficits: Secondary | ICD-10-CM

## 2015-02-10 DIAGNOSIS — I6522 Occlusion and stenosis of left carotid artery: Secondary | ICD-10-CM | POA: Diagnosis not present

## 2015-02-10 DIAGNOSIS — D151 Benign neoplasm of heart: Secondary | ICD-10-CM

## 2015-02-10 DIAGNOSIS — J84112 Idiopathic pulmonary fibrosis: Secondary | ICD-10-CM | POA: Diagnosis not present

## 2015-02-10 DIAGNOSIS — J479 Bronchiectasis, uncomplicated: Secondary | ICD-10-CM

## 2015-02-10 NOTE — Progress Notes (Signed)
Cardiology Office Note   Date:  02/10/2015   ID:  Courtney Faulkner, DOB [birth year], MRN [id number]  PCP:  Sinclair Grooms, MD  Cardiologist:  Sinclair Grooms, MD   Chief Complaint  Patient presents with  . Heart Problem    s/p LA mysoma      History of Present Illness: Courtney Faulkner is a 64 y.o. female who presents for  Follow-up of left atrial myxoma resection 8850, embolic CVA, interstitial lung disease.  Courtney Faulkner is  doing well. She has not had any cardiac difficulty. She does have dyspnea on exertion. She has interstitial lung disease. She's had no transient neurological events. No palpitations or arrhythmia. Her recent echo revealed no evidence of recurrent myxoma.    Past Medical History  Diagnosis Date  . ICAO (internal carotid artery occlusion), left 12/15/2011  . 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 CVA (cerebral infarction) 12/15/2011  . Postinflammatory pulmonary fibrosis   . Stroke   . Shortness of breath dyspnea     only with active exertional activity    Past Surgical History  Procedure Laterality Date  . Minimally-invasive excision of left atrial myxoma  12/24/2011    Dr Roxy Manns  . Abdominal angiogram  12/18/2011    Procedure: ABDOMINAL ANGIOGRAM;  Surgeon: Sinclair Grooms, MD;  Location: [facility] CATH LAB;  Service: Cardiovascular;;     Current Outpatient Prescriptions  Medication Sig Dispense Refill  . aspirin 81 MG tablet Take 81 mg by mouth daily.    . metoprolol tartrate (LOPRESSOR) 25 MG tablet Take 1 tablet (25 mg total) by mouth 2 (two) times daily. 60 tablet 1  . Multiple Vitamin (MULTIVITAMIN) tablet Take 1 tablet by mouth daily.    . Pirfenidone (ESBRIET) 267 MG CAPS Take as directed - 3 capsules by mouth three times a day with food 270 capsule 6   No current facility-administered medications for this visit.    Allergies:   Review of patient's allergies indicates no known allergies.    Social History:  The patient  reports that she quit  smoking about 13 years ago. Her smoking use included Cigarettes. She has a 10 pack-year smoking history. She has never used smokeless tobacco. She reports that she does not drink alcohol or use illicit drugs.   Family History:  The patient's family history includes COPD in her father; Diabetes in an other family member; Hypertension in her mother. There is no history of Coronary artery disease.    ROS:  Please see the history of present illness.   Otherwise, review of systems are positive for  Dyspnea on exertion.   All other systems are reviewed and negative.    PHYSICAL EXAM: VS:  There were no vitals taken for this visit. , BMI There is no weight on file to calculate BMI. GEN: Well nourished, well developed, in no acute distress HEENT: normal Neck: no JVD, carotid bruits, or masses Cardiac: RRR; no murmurs, rubs, or gallops,no edema  Respiratory:  clear to auscultation bilaterally, normal work of breathing GI: soft, nontender, nondistended, + BS MS: no deformity or atrophy Skin: warm and dry, no rash Neuro:  Strength and sensation are intact Psych: euthymic mood, full affect   EKG:  EKG is ordered today. The ekg ordered today demonstrates  Normal sinus rhythm with overall normal appearance.   Recent Labs: 07/25/2014: Hemoglobin 11.5*; Platelets 295.0 11/28/2014: ALT 15    Lipid Panel    Component Value Date/Time   CHOL  134 12/16/2011 0311   TRIG 90 12/16/2011 0311   HDL 45 12/16/2011 0311   CHOLHDL 3.0 12/16/2011 0311   VLDL 18 12/16/2011 0311   LDLCALC 71 12/16/2011 0311      Wt Readings from Last 3 Encounters:  11/28/14 59.421 kg (131 lb)  07/25/14 61.326 kg (135 lb 3.2 oz)  06/21/14 60.782 kg (134 lb)      Other studies Reviewed: Additional studies/ records that were reviewed today include: . Review of the above records demonstrates: Echo reviewed, 02/09/15 Study Conclusions  - Left ventricle: The cavity size was normal. Wall thickness was normal. Systolic  function was normal. The estimated ejection fraction was in the range of 60% to 65%. Wall motion was normal; there were no regional wall motion abnormalities. Left ventricular diastolic function parameters were normal. - Mitral valve: Elongated mitral leaflets without prolapse. Mild regurgitation. - Left atrium: The atrium was at the upper limits of normal in size. - Tricuspid valve: There was mild regurgitation. - Pulmonary arteries: PA peak pressure: 30 mm Hg (S). - Inferior vena cava: The vessel was normal in size. The respirophasic diameter changes were in the normal range (>= 50%), consistent with normal central venous pressure.    ASSESSMENT AND PLAN:  Atrial myxoma -  No clinical evidence of recurrence.  ICAO (internal carotid artery occlusion), left  IPF (idiopathic pulmonary fibrosis)  History of CVA (cerebrovascular accident) -  Prior stroke presumed a BM Bolick related to left atrial myxoma.  Bronchiectasis without complication     Current medicines are reviewed at length with the patient today.  The patient does not have concerns regarding medicines.  The following changes have been made:  no change.  She does need to have a bilateral carotid Doppler done because of her prior presumed embolic CVA. We'll repeat her echocardiogram again in 2 years and if normal further follow-up will not be necessary.  Labs/ tests ordered today include:  No orders of the defined types were placed in this encounter.     Disposition:   FU with HS in 1 year  Signed, Sinclair Grooms, MD  02/10/2015 2:[address] [facility], [place], [place]  [address] Phone: [phone]; Fax: [phone]

## 2015-02-10 NOTE — Patient Instructions (Signed)
Medication Instructions:  Your physician recommends that you continue on your current medications as directed. Please refer to the Current Medication list given to you today.   Labwork: None   Testing/Procedures: Your physician has requested that you have a carotid duplex. This test is an ultrasound of the carotid arteries in your neck. It looks at blood flow through these arteries that supply the brain with blood. Allow one hour for this exam. There are no restrictions or special instructions.   Follow-Up: Your physician wants you to follow-up in: 1 year with Dr.Smith You will receive a reminder letter in the mail two months in advance. If you don't receive a letter, please call our office to schedule the follow-up appointment.   Any Other Special Instructions Will Be Listed Below (If Applicable).

## 2015-02-13 ENCOUNTER — Ambulatory Visit (HOSPITAL_COMMUNITY): Payer: Medicare Other | Admitting: Anesthesiology

## 2015-02-13 ENCOUNTER — Encounter (HOSPITAL_COMMUNITY): Payer: Self-pay | Admitting: *Deleted

## 2015-02-13 ENCOUNTER — Encounter (HOSPITAL_COMMUNITY)
Admission: RE | Disposition: A | Discharge status: Home / Self Care | Payer: Self-pay | Source: Ambulatory Visit | Attending: Gastroenterology

## 2015-02-13 ENCOUNTER — Ambulatory Visit (HOSPITAL_COMMUNITY)
Admission: RE | Admit: 2015-02-13 | Discharge: 2015-02-13 | Disposition: A | Discharge status: Home / Self Care | Payer: Medicare Other | Source: Ambulatory Visit | Attending: Gastroenterology | Admitting: Gastroenterology

## 2015-02-13 DIAGNOSIS — Z87891 Personal history of nicotine dependence: Secondary | ICD-10-CM | POA: Diagnosis not present

## 2015-02-13 DIAGNOSIS — Z8673 Personal history of transient ischemic attack (TIA), and cerebral infarction without residual deficits: Secondary | ICD-10-CM | POA: Insufficient documentation

## 2015-02-13 DIAGNOSIS — Z1211 Encounter for screening for malignant neoplasm of colon: Secondary | ICD-10-CM | POA: Insufficient documentation

## 2015-02-13 DIAGNOSIS — Z86010 Personal history of colonic polyps: Secondary | ICD-10-CM | POA: Insufficient documentation

## 2015-02-13 HISTORY — PX: COLONOSCOPY WITH PROPOFOL: SHX5780

## 2015-02-13 HISTORY — DX: Reserved for inherently not codable concepts without codable children: IMO0001

## 2015-02-13 SURGERY — COLONOSCOPY WITH PROPOFOL
Anesthesia: Monitor Anesthesia Care

## 2015-02-13 MED ORDER — PROPOFOL 10 MG/ML IV BOLUS
INTRAVENOUS | Status: AC
  Filled 2015-02-13: qty 20

## 2015-02-13 MED ORDER — SODIUM CHLORIDE 0.9 % IV SOLN: INTRAVENOUS | Status: DC

## 2015-02-13 MED ORDER — LACTATED RINGERS IV SOLN
INTRAVENOUS | Status: DC
  Administered 2015-02-13: 1000 mL via INTRAVENOUS

## 2015-02-13 MED ORDER — PROPOFOL 10 MG/ML IV BOLUS
INTRAVENOUS | Status: DC | PRN
  Administered 2015-02-13: 20 mg via INTRAVENOUS
  Administered 2015-02-13: 40 mg via INTRAVENOUS
  Administered 2015-02-13 (×2): 20 mg via INTRAVENOUS

## 2015-02-13 SURGICAL SUPPLY — 22 items

## 2015-02-13 NOTE — Op Note (Signed)
Procedure: Surveillance  colonoscopy. Normal flexible proctosigmoidoscopy performed on 08/13/2011. Personal history of colon polyps  Endoscopist: Earle Gell  Premedication: Propofol administered by anesthesia  Procedure: The patient was placed in the left lateral decubitus position. Anal inspection and digital rectal exam were normal. The Pentax pediatric colonoscope was introduced into the rectum and advanced to the cecum. A normal-appearing appendiceal orifice and ileocecal valve were identified. Colonic preparation for the exam today was good. Withdrawal time was 9 minutes  Rectum. Normal. Retroflexed view of the distal rectum was normal  Sigmoid colon and descending colon.  Splenic flexure. Normal  Transverse colon. Normal  Hepatic flexure. Normal  Ascending colon. Normal  Cecum and ileocecal valve. Normal  Assessment: Normal surveillance colonoscopy  Recommendation: Schedule repeat colonoscopy in 5 years

## 2015-02-13 NOTE — H&P (Signed)
  Procedure: Screening colonoscopy.  History: The patient is a 64 year old female born 04/23/1951. She is scheduled to undergo a screening colonoscopy today. She underwent a normal screening flexible proctosigmoidoscopy in December 2012.  Past medical history: Left atrial myxoma surgery in April 2013. Left carotid distribution embolic stroke. Bi basilar pulmonary fibrosis. Hypersensitivity pneumonitis with fibrosis. Left carotid artery occlusion.  Exam: The patient is alert and lying comfortably on the endoscopy stretcher. Abdomen is soft and nontender to palpation. Lungs are clear to auscultation. Cardiac exam reveals a regular rhythm.  Plan: Proceed with screening colonoscopy

## 2015-02-13 NOTE — Transfer of Care (Signed)
Immediate Anesthesia Transfer of Care Note  Patient: Courtney Faulkner  Procedure(s) Performed: Procedure(s): COLONOSCOPY WITH PROPOFOL (N/A)  Patient Location: PACU and Endoscopy Unit  Anesthesia Type:MAC  Level of Consciousness: awake, alert , oriented and patient cooperative  Airway & Oxygen Therapy: Patient Spontanous Breathing and Patient connected to face mask oxygen  Post-op Assessment: Report given to RN, Post -op Vital signs reviewed and stable and Patient moving all extremities  Post vital signs: Reviewed and stable  Last Vitals:  Filed Vitals:   02/13/15 0945  BP: 132/65  Temp:   Resp: 27    Complications: No apparent anesthesia complications

## 2015-02-13 NOTE — Anesthesia Postprocedure Evaluation (Signed)
  Anesthesia Post-op Note  Patient: Courtney Faulkner  Procedure(s) Performed: Procedure(s) (LRB): COLONOSCOPY WITH PROPOFOL (N/A)  Patient Location: PACU  Anesthesia Type: MAC  Level of Consciousness: awake and alert   Airway and Oxygen Therapy: Patient Spontanous Breathing  Post-op Pain: mild  Post-op Assessment: Post-op Vital signs reviewed, Patient's Cardiovascular Status Stable, Respiratory Function Stable, Patent Airway and No signs of Nausea or vomiting  Last Vitals:  Filed Vitals:   02/13/15 1225  BP:   Pulse: 65  Temp:   Resp: 25    Post-op Vital Signs: stable   Complications: No apparent anesthesia complications

## 2015-02-13 NOTE — Anesthesia Preprocedure Evaluation (Signed)
Anesthesia Evaluation  Patient identified by MRN, date of birth, ID band Patient awake    Reviewed: Allergy & Precautions, NPO status , Patient's Chart, lab work & pertinent test results  Airway Mallampati: II  TM Distance: >3 FB Neck ROM: Full    Dental no notable dental hx.    Pulmonary former smoker,  Pulmonary fibrosis  breath sounds clear to auscultation  Pulmonary exam normal       Cardiovascular negative cardio ROS Normal cardiovascular examRhythm:Regular Rate:Normal     Neuro/Psych CVA, Residual Symptoms negative psych ROS   GI/Hepatic negative GI ROS, Neg liver ROS,   Endo/Other  negative endocrine ROS  Renal/GU negative Renal ROS  negative genitourinary   Musculoskeletal negative musculoskeletal ROS (+)   Abdominal   Peds negative pediatric ROS (+)  Hematology negative hematology ROS (+)   Anesthesia Other Findings   Reproductive/Obstetrics negative OB ROS                             Anesthesia Physical Anesthesia Plan  ASA: III  Anesthesia Plan: MAC   Post-op Pain Management:    Induction:   Airway Management Planned: Simple Face Mask  Additional Equipment:   Intra-op Plan:   Post-operative Plan:   Informed Consent: I have reviewed the patients History and Physical, chart, labs and discussed the procedure including the risks, benefits and alternatives for the proposed anesthesia with the patient or authorized representative who has indicated his/her understanding and acceptance.   Dental advisory given  Plan Discussed with: CRNA  Anesthesia Plan Comments:         Anesthesia Quick Evaluation

## 2015-02-13 NOTE — Discharge Instructions (Signed)
Colonoscopy, Care After °These instructions give you information on caring for yourself after your procedure. Your doctor may also give you more specific instructions. Call your doctor if you have any problems or questions after your procedure. °HOME CARE °· Do not drive for 24 hours. °· Do not sign important papers or use machinery for 24 hours. °· You may shower. °· You may go back to your usual activities, but go slower for the first 24 hours. °· Take rest breaks often during the first 24 hours. °· Walk around or use warm packs on your belly (abdomen) if you have belly cramping or gas. °· Drink enough fluids to keep your pee (urine) clear or pale yellow. °· Resume your normal diet. Avoid heavy or fried foods. °· Avoid drinking alcohol for 24 hours or as told by your doctor. °· Only take medicines as told by your doctor. °If a tissue sample (biopsy) was taken during the procedure:  °· Do not take aspirin or blood thinners for 7 days, or as told by your doctor. °· Do not drink alcohol for 7 days, or as told by your doctor. °· Eat soft foods for the first 24 hours. °GET HELP IF: °You still have a small amount of blood in your poop (stool) 2-3 days after the procedure. °GET HELP RIGHT AWAY IF: °· You have more than a small amount of blood in your poop. °· You see clumps of tissue (blood clots) in your poop. °· Your belly is puffy (swollen). °· You feel sick to your stomach (nauseous) or throw up (vomit). °· You have a fever. °· You have belly pain that gets worse and medicine does not help. °MAKE SURE YOU: °· Understand these instructions. °· Will watch your condition. °· Will get help right away if you are not doing well or get worse. °Document Released: 09/14/2010 Document Revised: 08/17/2013 Document Reviewed: 04/19/2013 °Exit[employer]® Patient Information ©2015 [employer]. This information is not intended to replace advice given to you by your health care provider. Make sure you discuss any questions you have with  your health care provider. ° °

## 2015-02-14 ENCOUNTER — Encounter (HOSPITAL_COMMUNITY): Payer: Self-pay | Admitting: Gastroenterology

## 2015-02-22 ENCOUNTER — Ambulatory Visit (HOSPITAL_COMMUNITY): Payer: Medicare Other | Attending: Cardiovascular Disease

## 2015-02-22 DIAGNOSIS — Z8673 Personal history of transient ischemic attack (TIA), and cerebral infarction without residual deficits: Secondary | ICD-10-CM

## 2015-02-22 DIAGNOSIS — Z87891 Personal history of nicotine dependence: Secondary | ICD-10-CM | POA: Insufficient documentation

## 2015-02-22 DIAGNOSIS — I6522 Occlusion and stenosis of left carotid artery: Secondary | ICD-10-CM

## 2015-05-29 ENCOUNTER — Telehealth: Payer: Self-pay | Admitting: Pulmonary Disease

## 2015-05-29 NOTE — Telephone Encounter (Signed)
Closed in error.

## 2015-05-29 NOTE — Telephone Encounter (Signed)
Courtney Faulkner - [facility] called [phone] Needs to know if an O2 sat on room air was done at patient's last visit in April. I had created a message but closed it in error.  Sorry.

## 2015-05-29 NOTE — Telephone Encounter (Signed)
Left message for Lynn to call back

## 2015-05-30 NOTE — Telephone Encounter (Signed)
lmtcb x2 for [employer].

## 2015-05-31 NOTE — Telephone Encounter (Signed)
Looked back at 11/28/14 OV. No O2 sat is documented in chart.  Called made [facility] aware. She is requesting that we refax the note over to her. I have done so. Nothing further needed

## 2015-06-07 ENCOUNTER — Other Ambulatory Visit: Payer: Self-pay

## 2015-06-07 DIAGNOSIS — Z1231 Encounter for screening mammogram for malignant neoplasm of breast: Secondary | ICD-10-CM

## 2015-06-11 ENCOUNTER — Encounter (HOSPITAL_COMMUNITY): Payer: Self-pay | Admitting: Emergency Medicine

## 2015-06-11 ENCOUNTER — Emergency Department (HOSPITAL_COMMUNITY): Payer: Medicare Other

## 2015-06-11 ENCOUNTER — Emergency Department (HOSPITAL_COMMUNITY)
Admission: EM | Admit: 2015-06-11 | Discharge: 2015-06-11 | Disposition: A | Discharge status: Home / Self Care | Payer: Medicare Other | Attending: Emergency Medicine | Admitting: Emergency Medicine

## 2015-06-11 DIAGNOSIS — Y9389 Activity, other specified: Secondary | ICD-10-CM | POA: Insufficient documentation

## 2015-06-11 DIAGNOSIS — S0990XA Unspecified injury of head, initial encounter: Secondary | ICD-10-CM | POA: Diagnosis not present

## 2015-06-11 DIAGNOSIS — M545 Low back pain: Secondary | ICD-10-CM | POA: Diagnosis not present

## 2015-06-11 DIAGNOSIS — W19XXXA Unspecified fall, initial encounter: Secondary | ICD-10-CM

## 2015-06-11 DIAGNOSIS — S0003XA Contusion of scalp, initial encounter: Secondary | ICD-10-CM | POA: Diagnosis not present

## 2015-06-11 DIAGNOSIS — Y9289 Other specified places as the place of occurrence of the external cause: Secondary | ICD-10-CM | POA: Diagnosis not present

## 2015-06-11 DIAGNOSIS — S098XXA Other specified injuries of head, initial encounter: Secondary | ICD-10-CM | POA: Diagnosis not present

## 2015-06-11 DIAGNOSIS — S300XXA Contusion of lower back and pelvis, initial encounter: Secondary | ICD-10-CM

## 2015-06-11 DIAGNOSIS — Z7901 Long term (current) use of anticoagulants: Secondary | ICD-10-CM

## 2015-06-11 DIAGNOSIS — Z87891 Personal history of nicotine dependence: Secondary | ICD-10-CM | POA: Diagnosis not present

## 2015-06-11 DIAGNOSIS — Z7982 Long term (current) use of aspirin: Secondary | ICD-10-CM | POA: Insufficient documentation

## 2015-06-11 DIAGNOSIS — Y998 Other external cause status: Secondary | ICD-10-CM | POA: Insufficient documentation

## 2015-06-11 DIAGNOSIS — Z862 Personal history of diseases of the blood and blood-forming organs and certain disorders involving the immune mechanism: Secondary | ICD-10-CM | POA: Insufficient documentation

## 2015-06-11 DIAGNOSIS — Z8701 Personal history of pneumonia (recurrent): Secondary | ICD-10-CM | POA: Insufficient documentation

## 2015-06-11 DIAGNOSIS — Z86010 Personal history of colonic polyps: Secondary | ICD-10-CM | POA: Diagnosis not present

## 2015-06-11 DIAGNOSIS — T148 Other injury of unspecified body region: Secondary | ICD-10-CM | POA: Diagnosis not present

## 2015-06-11 DIAGNOSIS — M25552 Pain in left hip: Secondary | ICD-10-CM | POA: Diagnosis not present

## 2015-06-11 DIAGNOSIS — S0101XA Laceration without foreign body of scalp, initial encounter: Secondary | ICD-10-CM

## 2015-06-11 DIAGNOSIS — Z79899 Other long term (current) drug therapy: Secondary | ICD-10-CM | POA: Insufficient documentation

## 2015-06-11 DIAGNOSIS — W1839XA Other fall on same level, initial encounter: Secondary | ICD-10-CM | POA: Diagnosis not present

## 2015-06-11 MED ORDER — LIDOCAINE HCL 2 % IJ SOLN
5.0000 mL | Freq: Once | INTRAMUSCULAR | Status: AC
  Administered 2015-06-11: 100 mg via INTRADERMAL
  Filled 2015-06-11: qty 20

## 2015-06-11 MED ORDER — HYDROCODONE-ACETAMINOPHEN 5-325 MG PO TABS: 1.0000 | ORAL_TABLET | Freq: Four times a day (QID) | ORAL | Status: DC | PRN

## 2015-06-11 MED ORDER — HYDROCODONE-ACETAMINOPHEN 5-325 MG PO TABS
2.0000 | ORAL_TABLET | Freq: Once | ORAL | Status: AC
  Administered 2015-06-11: 2 via ORAL
  Filled 2015-06-11: qty 2

## 2015-06-11 NOTE — ED Notes (Signed)
Pt attempted to ambulate, unable to bear weight.  Dr Stark Jock present, new orders placed.

## 2015-06-11 NOTE — ED Provider Notes (Signed)
CSN: [id number]     Arrival date & time 06/11/15  0757 History   First MD Initiated Contact with Patient 06/11/15 [phone]     Chief Complaint  Patient presents with  . Fall  . Head Laceration     (Consider location/radiation/quality/duration/timing/severity/associated sxs/prior Treatment) HPI Comments: Patient is a 65 year old female with history of CVA on Coumadin. She presents for evaluation of a fall. She was walking down some wooden stairs this morning when she tripped and fell. She struck the back of her head. Patient does not recall the specifics of the fall, but her family member at bedside did hear her fall does not believe she lost consciousness. She is complaining of pain in her head, laceration to the back of the scalp, and pain in her left hip and back.  Patient is a 64 y.o. female presenting with fall. The history is provided by the patient.  Fall This is a new problem. The current episode started less than 1 hour ago. The problem occurs constantly. The problem has not changed since onset.Nothing aggravates the symptoms. Nothing relieves the symptoms. She has tried nothing for the symptoms. The treatment provided no relief.    Past Medical History  Diagnosis Date  . ICAO (internal carotid artery occlusion), left 12/15/2011  . 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 CVA (cerebral infarction) 12/15/2011  . Postinflammatory pulmonary fibrosis ([place])   . Stroke ([place])   . Shortness of breath dyspnea     only with active exertional activity   Past Surgical History  Procedure Laterality Date  . Minimally-invasive excision of left atrial myxoma  12/24/2011    Dr Roxy Manns  . Abdominal angiogram  12/18/2011    Procedure: ABDOMINAL ANGIOGRAM;  Surgeon: Sinclair Grooms, MD;  Location: [facility] CATH LAB;  Service: Cardiovascular;;  . Colonoscopy with propofol N/A 02/13/2015    Procedure: COLONOSCOPY WITH PROPOFOL;  Surgeon: Garlan Fair, MD;  Location: [facility] ENDOSCOPY;  Service: Endoscopy;  Laterality: N/A;    Family History  Problem Relation Age of Onset  . Coronary artery disease Neg Hx   . Hypertension Mother   . COPD Father   . Diabetes     Social History  Substance Use Topics  . Smoking status: Former Smoker -- 0.50 packs/day for 20 years    Types: Cigarettes    Quit date: 08/26/2001  . Smokeless tobacco: Never Used  . Alcohol Use: No   OB History    No data available     Review of Systems  All other systems reviewed and are negative.     Allergies  Review of patient's allergies indicates no known allergies.  Home Medications   Prior to Admission medications   Medication Sig Start Date End Date Taking? Authorizing Provider  aspirin 81 MG tablet Take 81 mg by mouth daily.    Historical Provider, MD  metoprolol tartrate (LOPRESSOR) 25 MG tablet Take 1 tablet (25 mg total) by mouth 2 (two) times daily. 01/17/12   Lavon Paganini Angiulli, PA-C  Multiple Vitamin (MULTIVITAMIN) tablet Take 1 tablet by mouth daily.    Historical Provider, MD  Pirfenidone (ESBRIET) 267 MG CAPS Take as directed - 3 capsules by mouth three times a day with food 11/16/14   Elsie Stain, MD   BP 110/57 mmHg  Pulse 56  Temp(Src) 97.5 F (36.4 C) (Oral)  Resp 25  SpO2 97% Physical Exam  Constitutional: She is oriented to person, place, and time. She appears well-developed and well-nourished. No distress.  HENT:  There is a 3.5 cm laceration to the posterior scalp. Bleeding is controlled.  Eyes: EOM are normal. Pupils are equal, round, and reactive to light.  Neck: Normal range of motion. Neck supple.  There is no cervical spine tenderness or step-off. She has painless range of motion in all directions.  Cardiovascular: Normal rate, regular rhythm and normal heart sounds.   No murmur heard. Pulmonary/Chest: Effort normal and breath sounds normal. No respiratory distress. She has no wheezes.  Abdominal: Soft. Bowel sounds are normal. She exhibits no distension. There is no tenderness.   Musculoskeletal:  There is tenderness to palpation of the left posterior hip. There is no obvious deformity. There is no shortening or external rotation of the leg. Distal pulses are easily palpable. Motor and sensation is intact to both feet.  Neurological: She is alert and oriented to person, place, and time. No cranial nerve deficit. She exhibits normal muscle tone. Coordination normal.  Skin: Skin is warm and dry. She is not diaphoretic.  Nursing note and vitals reviewed.   ED Course  Procedures (including critical care time) Labs Review Labs Reviewed - No data to display  Imaging Review No results found. I have personally reviewed and evaluated these images and lab results as part of my medical decision-making.   EKG Interpretation   Date/Time:  Sunday June 11 2015 08:04:32 EDT Ventricular Rate:  64 PR Interval:  128 QRS Duration: 92 QT Interval:  434 QTC Calculation: 448 R Axis:   -21 Text Interpretation:  Sinus rhythm Borderline left axis deviation  Nonspecific T abnormalities, diffuse leads Confirmed by Elide Stalzer  MD, Katura Eatherly  ([id number]) on 06/11/2015 8:07:59 AM     LACERATION REPAIR Performed by: Veryl Speak Authorized by: Veryl Speak Consent: Verbal consent obtained. Risks and benefits: risks, benefits and alternatives were discussed Consent given by: patient Patient identity confirmed: provided demographic data Prepped and Draped in normal sterile fashion Wound explored  Laceration Location: Occiput  Laceration Length: 3.5 cm  No Foreign Bodies seen or palpated  Anesthesia: local infiltration  Local anesthetic: lidocaine 2 % with epinephrine  Anesthetic total: 4 ml  Irrigation method: syringe Amount of cleaning: standard  Skin closure: Staples   Number of sutures: 3   Technique: Staples   Patient tolerance: Patient tolerated the procedure well with no immediate complications.   MDM   Final diagnoses:  None    Patient presents after a  fall down several steps. She hit the back of her head and caused a laceration which was repaired. This patient is on Coumadin, a head CT was obtained which was negative for intracranial bleed. She is neurologically intact.  X-rays of the pelvis and hips are negative as are the x-rays of the lumbar spine. We attempted to stand and ambulate her, however she continued to complain of pain in her left buttock. She then went for a CT scan of the pelvis which was negative for fracture. It does reveal bruising adjacent to the left iliac and gluteal musculature. She is given pain medication and appears to be feeling better. At this point I think she is appropriate to be discharged. She will be treated with pain medication and when necessary return.    Veryl Speak, MD 06/11/15 1256

## 2015-06-11 NOTE — Discharge Instructions (Signed)
Hydrocodone as prescribed as needed for pain.  Rest.  Return to the ER if symptoms significantly worsen or change.   Concussion, Adult A concussion, or closed-head injury, is a brain injury caused by a direct blow to the head or by a quick and sudden movement (jolt) of the head or neck. Concussions are usually not life-threatening. Even so, the effects of a concussion can be serious. If you have had a concussion before, you are more likely to experience concussion-like symptoms after a direct blow to the head.  CAUSES  Direct blow to the head, such as from running into another player during a soccer game, being hit in a fight, or hitting your head on a hard surface.  A jolt of the head or neck that causes the brain to move back and forth inside the skull, such as in a car crash. SIGNS AND SYMPTOMS The signs of a concussion can be hard to notice. Early on, they may be missed by you, family members, and health care providers. You may look fine but act or feel differently. Symptoms are usually temporary, but they may last for days, weeks, or even longer. Some symptoms may appear right away while others may not show up for hours or days. Every head injury is different. Symptoms include:  Mild to moderate headaches that will not go away.  A feeling of pressure inside your head.  Having more trouble than usual:  Learning or remembering things you have heard.  Answering questions.  Paying attention or concentrating.  Organizing daily tasks.  Making decisions and solving problems.  Slowness in thinking, acting or reacting, speaking, or reading.  Getting lost or being easily confused.  Feeling tired all the time or lacking energy (fatigued).  Feeling drowsy.  Sleep disturbances.  Sleeping more than usual.  Sleeping less than usual.  Trouble falling asleep.  Trouble sleeping (insomnia).  Loss of balance or feeling lightheaded or dizzy.  Nausea or vomiting.  Numbness or  tingling.  Increased sensitivity to:  Sounds.  Lights.  Distractions.  Vision problems or eyes that tire easily.  Diminished sense of taste or smell.  Ringing in the ears.  Mood changes such as feeling sad or anxious.  Becoming easily irritated or angry for little or no reason.  Lack of motivation.  Seeing or hearing things other people do not see or hear (hallucinations). DIAGNOSIS Your health care provider can usually diagnose a concussion based on a description of your injury and symptoms. He or she will ask whether you passed out (lost consciousness) and whether you are having trouble remembering events that happened right before and during your injury. Your evaluation might include:  A brain scan to look for signs of injury to the brain. Even if the test shows no injury, you may still have a concussion.  Blood tests to be sure other problems are not present. TREATMENT  Concussions are usually treated in an emergency department, in urgent care, or at a clinic. You may need to stay in the hospital overnight for further treatment.  Tell your health care provider if you are taking any medicines, including prescription medicines, over-the-counter medicines, and natural remedies. Some medicines, such as blood thinners (anticoagulants) and aspirin, may increase the chance of complications. Also tell your health care provider whether you have had alcohol or are taking illegal drugs. This information may affect treatment.  Your health care provider will send you home with important instructions to follow.  How fast you will recover  from a concussion depends on many factors. These factors include how severe your concussion is, what part of your brain was injured, your age, and how healthy you were before the concussion.  Most people with mild injuries recover fully. Recovery can take time. In general, recovery is slower in older persons. Also, persons who have had a concussion in  the past or have other medical problems may find that it takes longer to recover from their current injury. HOME CARE INSTRUCTIONS General Instructions  Carefully follow the directions your health care provider gave you.  Only take over-the-counter or prescription medicines for pain, discomfort, or fever as directed by your health care provider.  Take only those medicines that your health care provider has approved.  Do not drink alcohol until your health care provider says you are well enough to do so. Alcohol and certain other drugs may slow your recovery and can put you at risk of further injury.  If it is harder than usual to remember things, write them down.  If you are easily distracted, try to do one thing at a time. For example, do not try to watch TV while fixing dinner.  Talk with family members or close friends when making important decisions.  Keep all follow-up appointments. Repeated evaluation of your symptoms is recommended for your recovery.  Watch your symptoms and tell others to do the same. Complications sometimes occur after a concussion. Older adults with a brain injury may have a higher risk of serious complications, such as a blood clot on the brain.  Tell your teachers, school nurse, school counselor, coach, athletic trainer, or work Freight forwarder about your injury, symptoms, and restrictions. Tell them about what you can or cannot do. They should watch for:  Increased problems with attention or concentration.  Increased difficulty remembering or learning new information.  Increased time needed to complete tasks or assignments.  Increased irritability or decreased ability to cope with stress.  Increased symptoms.  Rest. Rest helps the brain to heal. Make sure you:  Get plenty of sleep at night. Avoid staying up late at night.  Keep the same bedtime hours on weekends and weekdays.  Rest during the day. Take daytime naps or rest breaks when you feel  tired.  Limit activities that require a lot of thought or concentration. These include:  Doing homework or job-related work.  Watching TV.  Working on the computer.  Avoid any situation where there is potential for another head injury (football, hockey, soccer, basketball, martial arts, downhill snow sports and horseback riding). Your condition will get worse every time you experience a concussion. You should avoid these activities until you are evaluated by the appropriate follow-up health care providers. Returning To Your Regular Activities You will need to return to your normal activities slowly, not all at once. You must give your body and brain enough time for recovery.  Do not return to sports or other athletic activities until your health care provider tells you it is safe to do so.  Ask your health care provider when you can drive, ride a bicycle, or operate heavy machinery. Your ability to react may be slower after a brain injury. Never do these activities if you are dizzy.  Ask your health care provider about when you can return to work or school. Preventing Another Concussion It is very important to avoid another brain injury, especially before you have recovered. In rare cases, another injury can lead to permanent brain damage, brain swelling, or  death. The risk of this is greatest during the first 7-10 days after a head injury. Avoid injuries by:  Wearing a seat belt when riding in a car.  Drinking alcohol only in moderation.  Wearing a helmet when biking, skiing, skateboarding, skating, or doing similar activities.  Avoiding activities that could lead to a second concussion, such as contact or recreational sports, until your health care provider says it is okay.  Taking safety measures in your home.  Remove clutter and tripping hazards from floors and stairways.  Use grab bars in bathrooms and handrails by stairs.  Place non-slip mats on floors and in  bathtubs.  Improve lighting in dim areas. SEEK MEDICAL CARE IF:  You have increased problems paying attention or concentrating.  You have increased difficulty remembering or learning new information.  You need more time to complete tasks or assignments than before.  You have increased irritability or decreased ability to cope with stress.  You have more symptoms than before. Seek medical care if you have any of the following symptoms for more than 2 weeks after your injury:  Lasting (chronic) headaches.  Dizziness or balance problems.  Nausea.  Vision problems.  Increased sensitivity to noise or light.  Depression or mood swings.  Anxiety or irritability.  Memory problems.  Difficulty concentrating or paying attention.  Sleep problems.  Feeling tired all the time. SEEK IMMEDIATE MEDICAL CARE IF:  You have severe or worsening headaches. These may be a sign of a blood clot in the brain.  You have weakness (even if only in one hand, leg, or part of the face).  You have numbness.  You have decreased coordination.  You vomit repeatedly.  You have increased sleepiness.  One pupil is larger than the other.  You have convulsions.  You have slurred speech.  You have increased confusion. This may be a sign of a blood clot in the brain.  You have increased restlessness, agitation, or irritability.  You are unable to recognize people or places.  You have neck pain.  It is difficult to wake you up.  You have unusual behavior changes.  You lose consciousness. MAKE SURE YOU:  Understand these instructions.  Will watch your condition.  Will get help right away if you are not doing well or get worse.   This information is not intended to replace advice given to you by your health care provider. Make sure you discuss any questions you have with your health care provider.   Document Released: 11/02/2003 Document Revised: 09/02/2014 Document Reviewed:  03/04/2013 Elsevier Interactive Patient Education 2016 [place] A contusion is a deep bruise. Contusions are the result of a blunt injury to tissues and muscle fibers under the skin. The injury causes bleeding under the skin. The skin overlying the contusion may turn blue, purple, or yellow. Minor injuries will give you a painless contusion, but more severe contusions may stay painful and swollen for a few weeks.  CAUSES  This condition is usually caused by a blow, trauma, or direct force to an area of the body. SYMPTOMS  Symptoms of this condition include:  Swelling of the injured area.  Pain and tenderness in the injured area.  Discoloration. The area may have redness and then turn blue, purple, or yellow. DIAGNOSIS  This condition is diagnosed based on a physical exam and medical history. An X-ray, CT scan, or MRI may be needed to determine if there are any associated injuries, such as broken bones (  fractures). TREATMENT  Specific treatment for this condition depends on what area of the body was injured. In general, the best treatment for a contusion is resting, icing, applying pressure to (compression), and elevating the injured area. This is often called the RICE strategy. Over-the-counter anti-inflammatory medicines may also be recommended for pain control.  HOME CARE INSTRUCTIONS   Rest the injured area.  If directed, apply ice to the injured area:  Put ice in a plastic bag.  Place a towel between your skin and the bag.  Leave the ice on for 20 minutes, 2-3 times per day.  If directed, apply light compression to the injured area using an elastic bandage. Make sure the bandage is not wrapped too tightly. Remove and reapply the bandage as directed by your health care provider.  If possible, raise (elevate) the injured area above the level of your heart while you are sitting or lying down.  Take over-the-counter and prescription medicines only as told by your  health care provider. SEEK MEDICAL CARE IF:  Your symptoms do not improve after several days of treatment.  Your symptoms get worse.  You have difficulty moving the injured area. SEEK IMMEDIATE MEDICAL CARE IF:   You have severe pain.  You have numbness in a hand or foot.  Your hand or foot turns pale or cold.   This information is not intended to replace advice given to you by your health care provider. Make sure you discuss any questions you have with your health care provider.   Document Released: 05/22/2005 Document Revised: 05/03/2015 Document Reviewed: 12/28/2014 Elsevier Interactive Patient Education [employer].

## 2015-06-11 NOTE — ED Notes (Signed)
MD at bedside. 

## 2015-06-11 NOTE — ED Notes (Signed)
Pt monitored by pulse ox, bp cuff, and 12-lead. 

## 2015-06-11 NOTE — ED Notes (Addendum)
Pt arrives from home via [facility] reporting fall down wooden stairs.  Pt unable to remember what caused fall, states possible LOC.  Pt's husband heard fall but unwitnessed, reports pt disoriented and confused.  Lac and swelling noted to posterior head. Pt reports taking warfarin.  Neuro intact.  NAD noted at this time.

## 2015-06-15 ENCOUNTER — Ambulatory Visit: Payer: Medicare Other | Admitting: Pulmonary Disease

## 2015-06-16 ENCOUNTER — Emergency Department (INDEPENDENT_AMBULATORY_CARE_PROVIDER_SITE_OTHER)
Admission: EM | Admit: 2015-06-16 | Discharge: 2015-06-16 | Disposition: A | Discharge status: Home / Self Care | Payer: Medicare Other | Source: Home / Self Care | Attending: Family Medicine | Admitting: Family Medicine

## 2015-06-16 ENCOUNTER — Encounter (HOSPITAL_COMMUNITY): Payer: Self-pay | Admitting: *Deleted

## 2015-06-16 DIAGNOSIS — Z4802 Encounter for removal of sutures: Secondary | ICD-10-CM

## 2015-06-16 MED ORDER — HYDROCODONE-ACETAMINOPHEN 5-325 MG PO TABS
1.0000 | ORAL_TABLET | Freq: Four times a day (QID) | ORAL | Status: DC | PRN
Start: 2015-06-16 — End: 2015-10-20

## 2015-06-16 NOTE — Discharge Instructions (Signed)
Return as needed. Suture Removal, Care After Refer to this sheet in the next few weeks. These instructions provide you with information on caring for yourself after your procedure. Your health care provider may also give you more specific instructions. Your treatment has been planned according to current medical practices, but problems sometimes occur. Call your health care provider if you have any problems or questions after your procedure. WHAT TO EXPECT AFTER THE PROCEDURE After your stitches (sutures) are removed, it is typical to have the following:  Some discomfort and swelling in the wound area.  Slight redness in the area. HOME CARE INSTRUCTIONS   If you have skin adhesive strips over the wound area, do not take the strips off. They will fall off on their own in a few days. If the strips remain in place after 14 days, you may remove them.  Change any bandages (dressings) at least once a day or as directed by your health care provider. If the bandage sticks, soak it off with warm, soapy water.  Apply cream or ointment only as directed by your health care provider. If using cream or ointment, wash the area with soap and water 2 times a day to remove all the cream or ointment. Rinse off the soap and pat the area dry with a clean towel.  Keep the wound area dry and clean. If the bandage becomes wet or dirty, or if it develops a bad smell, change it as soon as possible.  Continue to protect the wound from injury.  Use sunscreen when out in the sun. New scars become sunburned easily. SEEK MEDICAL CARE IF:  You have increasing redness, swelling, or pain in the wound.  You see pus coming from the wound.  You have a fever.  You notice a bad smell coming from the wound or dressing.  Your wound breaks open (edges not staying together).   This information is not intended to replace advice given to you by your health care provider. Make sure you discuss any questions you have with your  health care provider.   Document Released: 05/07/2001 Document Revised: 06/02/2013 Document Reviewed: 03/24/2013 Elsevier Interactive Patient Education [employer].

## 2015-06-16 NOTE — ED Provider Notes (Signed)
CSN: [id number]     Arrival date & time 06/16/15  1327 History   First MD Initiated Contact with Patient 06/16/15 1520     Chief Complaint  Patient presents with  . Follow-up   (Consider location/radiation/quality/duration/timing/severity/associated sxs/prior Treatment) Patient is a 64 y.o. female presenting with suture removal. The history is provided by the patient.  Suture / Staple Removal This is a new problem. The current episode started more than 2 days ago. The problem has been rapidly improving. Pertinent negatives include no headaches. Associated symptoms comments: No neuro sx, improving overall..    Past Medical History  Diagnosis Date  . ICAO (internal carotid artery occlusion), left 12/15/2011  . 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 CVA (cerebral infarction) 12/15/2011  . Postinflammatory pulmonary fibrosis ([place])   . Stroke ([place])   . Shortness of breath dyspnea     only with active exertional activity   Past Surgical History  Procedure Laterality Date  . Minimally-invasive excision of left atrial myxoma  12/24/2011    Dr Roxy Manns  . Abdominal angiogram  12/18/2011    Procedure: ABDOMINAL ANGIOGRAM;  Surgeon: Sinclair Grooms, MD;  Location: [facility] CATH LAB;  Service: Cardiovascular;;  . Colonoscopy with propofol N/A 02/13/2015    Procedure: COLONOSCOPY WITH PROPOFOL;  Surgeon: Garlan Fair, MD;  Location: [facility] ENDOSCOPY;  Service: Endoscopy;  Laterality: N/A;   Family History  Problem Relation Age of Onset  . Coronary artery disease Neg Hx   . Hypertension Mother   . COPD Father   . Diabetes     Social History  Substance Use Topics  . Smoking status: Former Smoker -- 0.50 packs/day for 20 years    Types: Cigarettes    Quit date: 08/26/2001  . Smokeless tobacco: Never Used  . Alcohol Use: No   OB History    No data available     Review of Systems  Constitutional: Negative.   Skin: Positive for wound.  Neurological: Negative for dizziness, weakness, light-headedness and  headaches.  All other systems reviewed and are negative.   Allergies  Review of patient's allergies indicates no known allergies.  Home Medications   Prior to Admission medications   Medication Sig Start Date End Date Taking? Authorizing Provider  aspirin 81 MG tablet Take 81 mg by mouth daily.    Historical Provider, MD  HYDROcodone-acetaminophen (NORCO/VICODIN) 5-325 MG tablet Take 1 tablet by mouth every 6 (six) hours as needed. 06/16/15   Billy Fischer, MD  metoprolol tartrate (LOPRESSOR) 25 MG tablet Take 1 tablet (25 mg total) by mouth 2 (two) times daily. 01/17/12   Lavon Paganini Angiulli, PA-C  Multiple Vitamin (MULTIVITAMIN) tablet Take 1 tablet by mouth daily.    Historical Provider, MD  Pirfenidone (ESBRIET) 267 MG CAPS Take as directed - 3 capsules by mouth three times a day with food 11/16/14   Elsie Stain, MD   Meds Ordered and Administered this Visit  Medications - No data to display  BP 134/85 mmHg  Pulse 103  Temp(Src) 98.7 F (37.1 C) (Oral)  Resp 14  SpO2 95% No data found.   Physical Exam  Constitutional: She is oriented to person, place, and time. She appears well-developed and well-nourished.  Neurological: She is alert and oriented to person, place, and time.  Skin: Skin is warm and dry.  Lac well healed, no problems.  Nursing note and vitals reviewed.   ED Course  Procedures (including critical care time)  Labs Review Labs Reviewed -  No data to display  Imaging Review No results found.   Visual Acuity Review  Right Eye Distance:   Left Eye Distance:   Bilateral Distance:    Right Eye Near:   Left Eye Near:    Bilateral Near:         MDM   1. Removal of staples    3 staples removed without diff.    Billy Fischer, MD 06/19/15 [id number]

## 2015-06-16 NOTE — ED Notes (Signed)
Pt  Was   Seen  Er     5  Days  Ago for  A  Fall  She  Is  Here  Today  For  followup   And  Removal of   Sutures           She  Reports  Continued  Pain in the  l  Hip    Yet  She  Is  Ambulatory  With a  Steady  Fluid  Gait

## 2015-06-20 ENCOUNTER — Other Ambulatory Visit: Payer: Self-pay | Admitting: *Deleted

## 2015-06-20 MED ORDER — PIRFENIDONE 267 MG PO CAPS: ORAL_CAPSULE | ORAL | Status: DC

## 2015-06-22 ENCOUNTER — Encounter: Payer: Self-pay | Admitting: Nurse Practitioner

## 2015-06-22 ENCOUNTER — Ambulatory Visit (INDEPENDENT_AMBULATORY_CARE_PROVIDER_SITE_OTHER): Payer: Medicare Other | Admitting: Nurse Practitioner

## 2015-06-22 VITALS — BP 110/76 | HR 76 | Ht 64.0 in | Wt 129.0 lb

## 2015-06-22 DIAGNOSIS — I6522 Occlusion and stenosis of left carotid artery: Secondary | ICD-10-CM | POA: Diagnosis not present

## 2015-06-22 DIAGNOSIS — Z8673 Personal history of transient ischemic attack (TIA), and cerebral infarction without residual deficits: Secondary | ICD-10-CM | POA: Diagnosis not present

## 2015-06-22 NOTE — Progress Notes (Signed)
[facility]  PATIENT: Courtney Faulkner DOB: [birth year]   REASON FOR VISIT: follow up for stroke, left spastic hemiparesis HISTORY FROM:patient    HISTORY OF PRESENT ILLNESS: HISTORY:63 year African American female with history of left MCA infarct from left carotid occlusion secondary to left atrial myxoma. In April 2013 History of atrial fibrillation status post-surgical ablation and remote smoking quit in 2003.   UPDATE 06/22/15 Courtney Faulkner, 64 year old female returns for follow-up. She was last seen in the office 06/21/2014 by Charlott Holler nurse practitioner.  She has not had any recurrent stroke or TIA symptoms since April 2013. She is tolerating daily aspirin well without bleeding or side effects. She continues to have mild right hand weakness as well as drags her right leg slightly when walking. She fell last week  going down the steps in her home. She states her blood pressure has been under good control, it is 110/76 in the office today. She is on a new medication for her pulmonary fibrosis which she thinks is helping; she only wears oxygen at night. Her last carotid duplex was in May 2014 . Will repeat.She remains active by walking. She denies any memory problems. She has no neurovascular complaints.     REVIEW OF SYSTEMS: Full 14 system review of systems performed and notable only for those listed, all others are neg:  Constitutional: neg  Cardiovascular: neg Ear/Nose/Throat: neg  Skin: neg Eyes: neg Respiratory: neg Gastroitestinal: neg  Hematology/Lymphatic: neg  Endocrine: neg Musculoskeletal:neg Allergy/Immunology: neg Neurological: neg Psychiatric: neg Sleep : neg   ALLERGIES: No Known Allergies  HOME MEDICATIONS: Outpatient Prescriptions Prior to Visit  Medication Sig Dispense Refill  . aspirin 81 MG tablet Take 81 mg by mouth daily.    Marland Kitchen HYDROcodone-acetaminophen (NORCO/VICODIN) 5-325 MG tablet Take 1 tablet by mouth every 6 (six) hours as  needed. 10 tablet 0  . metoprolol tartrate (LOPRESSOR) 25 MG tablet Take 1 tablet (25 mg total) by mouth 2 (two) times daily. 60 tablet 1  . Multiple Vitamin (MULTIVITAMIN) tablet Take 1 tablet by mouth daily.    . Pirfenidone (ESBRIET) 267 MG CAPS Take as directed - 3 capsules by mouth three times a day with food 270 capsule 6   No facility-administered medications prior to visit.    PAST MEDICAL HISTORY: Past Medical History  Diagnosis Date  . ICAO (internal carotid artery occlusion), left 12/15/2011  . 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 CVA (cerebral infarction) 12/15/2011  . Postinflammatory pulmonary fibrosis ([place])   . Stroke ([place])   . Shortness of breath dyspnea     only with active exertional activity    PAST SURGICAL HISTORY: Past Surgical History  Procedure Laterality Date  . Minimally-invasive excision of left atrial myxoma  12/24/2011    Dr Roxy Manns  . Abdominal angiogram  12/18/2011    Procedure: ABDOMINAL ANGIOGRAM;  Surgeon: Sinclair Grooms, MD;  Location: [facility] CATH LAB;  Service: Cardiovascular;;  . Colonoscopy with propofol N/A 02/13/2015    Procedure: COLONOSCOPY WITH PROPOFOL;  Surgeon: Garlan Fair, MD;  Location: [facility] ENDOSCOPY;  Service: Endoscopy;  Laterality: N/A;    FAMILY HISTORY: Family History  Problem Relation Age of Onset  . Coronary artery disease Neg Hx   . Hypertension Mother   . COPD Father   . Diabetes      SOCIAL HISTORY: Social History   Social History  . Marital Status: Married    Spouse Name: micheal  . Number of Children: 3  .  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 Smoking status: Former Smoker -- 0.50 packs/day for 20 years    Types: Cigarettes    Quit date: 08/26/2001  . Smokeless tobacco: Never Used  . Alcohol Use: No  . Drug Use: No  . Sexual Activity: Yes    Birth Control/ Protection: Post-menopausal   Other Topics Concern  . Not on file    Social History Narrative   Patient is married.   Patient has a Human resources officer   Patient is right handed   Caffeine Use- quit intake 11/2011     PHYSICAL EXAM  Filed Vitals:   06/22/15 0956  BP: 110/76  Pulse: 76  Height: 5\' 4"  (1.626 m)  Weight: 129 lb (58.514 kg)   Body mass index is 22.13 kg/(m^2). General: well developed, well nourished, seated, in no evident distress  Head: head normocephalic and atraumatic. Orohparynx benign  Neck: supple with no carotid  bruits  Cardiovascular: regular rate and rhythm, no murmurs  Musculoskeletal: no deformity  Skin: no rash/petichiae  Vascular: Normal pulses all extremities   Neurologic Exam  Mental Status: Awake and fully alert. Oriented to place and time. Recent and remote memory intact. Attention span, concentration and fund of knowledge appropriate. Mood and affect appropriate.  Cranial Nerves: Pupils equal, briskly reactive to light. Extraocular movements full without nystagmus. Visual fields full to confrontation. Hearing intact. Facial sensation intact. Face, tongue, palate moves normally and symmetrically.  Motor: Normal bulk and tone. Normal strength in all tested extremity muscles.  mild right facial droop as well as diminished fine motor skills on the right and orbits left over right upper extremity. Mild weakness of ankle dorsiflexors and right grip  Sensory: intact to light touch, pinprick and vibratory in all 4 extremities Coordination: Rapid alternating movements normal in all extremities. Finger-to-nose and heel-to-shin performed accurately bilaterally.  Gait and Station: Arises from chair without difficulty. Stance is normal. Gait demonstrates normal stride length. Able to heel, toe walk without difficulty. Tandem gait is unsteady.No assistive device Reflexes: 1+ and symmetric. Toes downgoing.  DIAGNOSTIC DATA (LABS, IMAGING, TESTING) - ASSESSMENT AND PLAN  64 y.o. year old female  has a past medical  history of ICAO (internal carotid artery occlusion), left (12/15/2011); Atrial myxoma (12/17/2011); CVA (cerebral infarction) (12/15/2011); Postinflammatory pulmonary fibrosis ([place]); Stroke [facility]); and Shortness of breath dyspnea. here to follow-up  Continue Aspirin 81 mg for stroke prevention Strict control of hypertension with blood pressure goal below 130/90. Today's reading 110/70 Schedule carotid doppler to monitor progression of carotid disease Return for followup in 1 year  Dennie Bible, [facility], [facility], APRN  [facility] [address], [place], [place] [address] [phone]

## 2015-06-22 NOTE — Progress Notes (Signed)
I agree with the above plan 

## 2015-06-22 NOTE — Patient Instructions (Addendum)
Continue Aspirin 81 mg for stroke prevention Strict control of hypertension with blood pressure goal below 130/90. Today's reading 110/70 Schedule carotid doppler  Return for followup in 1 year

## 2015-06-23 ENCOUNTER — Ambulatory Visit
Admission: RE | Admit: 2015-06-23 | Discharge: 2015-06-23 | Disposition: A | Discharge status: Home / Self Care | Payer: BC Managed Care – PPO | Source: Ambulatory Visit

## 2015-06-23 DIAGNOSIS — Z1231 Encounter for screening mammogram for malignant neoplasm of breast: Secondary | ICD-10-CM | POA: Diagnosis not present

## 2015-06-26 ENCOUNTER — Telehealth: Payer: Self-pay | Admitting: Critical Care Medicine

## 2015-06-26 ENCOUNTER — Ambulatory Visit (INDEPENDENT_AMBULATORY_CARE_PROVIDER_SITE_OTHER): Payer: Medicare Other

## 2015-06-26 DIAGNOSIS — Z23 Encounter for immunization: Secondary | ICD-10-CM | POA: Diagnosis not present

## 2015-06-26 NOTE — Telephone Encounter (Signed)
Called and spoke to pt. Pt states [facility] is needing OV note and sats. Advised pt that the last OV note was sent on 10.5.16 (per 10.3.16). Pt states she spoke with [facility] prior to 10.5.16 and she has not been contacted since then so they do not need any documentation. Nothing further needed at this time.

## 2015-06-28 ENCOUNTER — Ambulatory Visit (INDEPENDENT_AMBULATORY_CARE_PROVIDER_SITE_OTHER): Payer: Medicare Other

## 2015-06-28 DIAGNOSIS — I6522 Occlusion and stenosis of left carotid artery: Secondary | ICD-10-CM | POA: Diagnosis not present

## 2015-06-28 DIAGNOSIS — Z8673 Personal history of transient ischemic attack (TIA), and cerebral infarction without residual deficits: Secondary | ICD-10-CM

## 2015-06-29 ENCOUNTER — Other Ambulatory Visit: Payer: Self-pay

## 2015-06-29 MED ORDER — PIRFENIDONE 267 MG PO CAPS: ORAL_CAPSULE | ORAL | Status: DC

## 2015-07-04 ENCOUNTER — Telehealth: Payer: Self-pay | Admitting: *Deleted

## 2015-07-04 NOTE — Telephone Encounter (Signed)
I spoke to pt and relayed that the carotid doppler study results had not changed from her previous 2014 study.  She verbalized understanding.

## 2015-07-04 NOTE — Telephone Encounter (Signed)
-----   Message from Dennie Bible, NP sent at 07/03/2015  3:29 PM EST ----- Carotid doppler study has not changed from previous in 2014. Please call the patient

## 2015-07-17 DIAGNOSIS — H2513 Age-related nuclear cataract, bilateral: Secondary | ICD-10-CM | POA: Diagnosis not present

## 2015-07-17 DIAGNOSIS — H52203 Unspecified astigmatism, bilateral: Secondary | ICD-10-CM | POA: Diagnosis not present

## 2015-08-09 ENCOUNTER — Ambulatory Visit (INDEPENDENT_AMBULATORY_CARE_PROVIDER_SITE_OTHER): Payer: Medicare Other | Admitting: Pulmonary Disease

## 2015-08-09 ENCOUNTER — Encounter: Payer: Self-pay | Admitting: Pulmonary Disease

## 2015-08-09 ENCOUNTER — Other Ambulatory Visit (INDEPENDENT_AMBULATORY_CARE_PROVIDER_SITE_OTHER): Payer: Medicare Other

## 2015-08-09 VITALS — BP 126/74 | HR 54 | Ht 64.0 in | Wt 129.0 lb

## 2015-08-09 DIAGNOSIS — J84112 Idiopathic pulmonary fibrosis: Secondary | ICD-10-CM | POA: Diagnosis not present

## 2015-08-09 DIAGNOSIS — Z5181 Encounter for therapeutic drug level monitoring: Secondary | ICD-10-CM

## 2015-08-09 DIAGNOSIS — J479 Bronchiectasis, uncomplicated: Secondary | ICD-10-CM

## 2015-08-09 DIAGNOSIS — I6522 Occlusion and stenosis of left carotid artery: Secondary | ICD-10-CM

## 2015-08-09 LAB — HEPATIC FUNCTION PANEL
ALBUMIN: 4.2 g/dL (ref 3.5–5.2)
ALK PHOS: 73 U/L (ref 39–117)
ALT: 10 U/L (ref 0–35)
AST: 14 U/L (ref 0–37)
Bilirubin, Direct: 0.1 mg/dL (ref 0.0–0.3)
Total Bilirubin: 0.4 mg/dL (ref 0.2–1.2)
Total Protein: 8 g/dL (ref 6.0–8.3)

## 2015-08-09 NOTE — Assessment & Plan Note (Signed)
She is labeled as having idiopathic pulmonary fibrosis but I find this confusing because the CT scans performed in 2013 did not show evidence of usual interstitial pneumonitis. This occurred in the setting of a stroke when she says she thinks she was having recurrent aspiration events.  Interestingly, she's had improvement in symptoms over the years which is correlated with the timeframe when she was started on pirfenidone. The logical question is whether or not this was a causative effect or if they're just associated and not directly related.  Plan: Because she's doing well I would continue the pirfenidone for now However, need to hedge our bets a bit so I'm going to get a pulmonary function test as well as a high-resolution CT scan Continue liver function monitoring to ensure there is no evidence of drug toxicity Follow-up 6 months Flu shot is up-to-date

## 2015-08-09 NOTE — Assessment & Plan Note (Signed)
Again, no symptoms. We will check a pulmonary function test and high-resolution CT scan of the chest.

## 2015-08-09 NOTE — Patient Instructions (Signed)
We will order a lung function test and a CT scan and call you with the results Keep taking pirfenidone as you're doing We will see you back in 6 months or sooner if needed

## 2015-08-09 NOTE — Progress Notes (Signed)
Subjective:    Patient ID: [name], female    DOB: 07-28-1951, 64 y.o.   MRN: [id number]  Synopsis: Former patient of Dr. Joya Gaskins with pulmonary fibrosis. Dr. Joya Gaskins summarized her clinical situation as follows: Pulmonary fibrosis and basilar bronchiectasis ?from microaspiration. CTA Chest 06/16/2012>>RUL fibrosis and bilateral LL fibrosis with traction bronchiectasis, No PE PFTs 06/16/2012 >> FeV1 39%  FVC 35%  TLC 41%  DLCO 24% Serology 06/16/2012>> IgE 165  ESR 44   PFTs 02/16/13:  FeV1 50% TLC 43%  DLCO 46%  Improved from 05/2012 ONO 11/30/2013: desaturation QHS 07/25/2014 NO desaturation with exertion, marked improvement on pirfenidone LFTs/CBC 07/25/2014>>Normal LFTs 11/28/2014>>normal   HPI Chief Complaint  Patient presents with  . Follow-up    former PW pt being treated for pulm fibrosis.  pt has no breathing complaints at this time.      This is a pleasant 64 year old female who comes to establish care with me today for her fibrosis. She had a stroke about 3-4 years ago and then after that was having what was thought to be some aspiration events. She had persistent episodes of pneumonia and persistent abnormalities on the CT scan of her chest. She was started on pirfenidone for a presumed diagnosis of idiopathic pulmonary primary versus. Since then she's had a significant improvement in her symptoms. She tells me that she has no complaints of shortness of breath unless she walks up a flight of stairs or moves too quickly. She does not have a cough or wheeze. She has not been treated for pneumonia or bronchitis in the last year. She exercises regularly and wants to start swimming again.   Past Medical History  Diagnosis Date  . ICAO (internal carotid artery occlusion), left 12/15/2011  . 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 CVA (cerebral infarction) 12/15/2011  . Postinflammatory pulmonary fibrosis ([place])   . Stroke ([place])   . Shortness of breath dyspnea     only with active  exertional activity      Review of Systems     Objective:   Physical Exam  Filed Vitals:   08/09/15 1123  BP: 126/74  Pulse: 54  Height: 5' 4"  (1.626 m)  Weight: 129 lb (58.514 kg)  SpO2: 96%   Room air  Gen: well appearing HENT: OP clear, TM's clear, neck supple PULM: Crackles R base only, normal percussion CV: RRR, no mgr, trace edema GI: BS+, soft, nontender Derm: no cyanosis or rash Psyche: normal mood and affect  2013 CT scan of the chest images personally reviewed showing consolidative changes right upper lobe and the right base.     Assessment & Plan:  IPF (idiopathic pulmonary fibrosis) ([place]) She is labeled as having idiopathic pulmonary fibrosis but I find this confusing because the CT scans performed in 2013 did not show evidence of usual interstitial pneumonitis. This occurred in the setting of a stroke when she says she thinks she was having recurrent aspiration events.  Interestingly, she's had improvement in symptoms over the years which is correlated with the timeframe when she was started on pirfenidone. The logical question is whether or not this was a causative effect or if they're just associated and not directly related.  Plan: Because she's doing well I would continue the pirfenidone for now However, need to hedge our bets a bit so I'm going to get a pulmonary function test as well as a high-resolution CT scan Continue liver function monitoring to ensure there is no evidence of drug toxicity  Follow-up 6 months Flu shot is up-to-date  Bronchiectasis without acute exacerbation (HCC) Again, no symptoms. We will check a pulmonary function test and high-resolution CT scan of the chest.     Current outpatient prescriptions:  .  aspirin 81 MG tablet, Take 81 mg by mouth daily., Disp: , Rfl:  .  HYDROcodone-acetaminophen (NORCO/VICODIN) 5-325 MG tablet, Take 1 tablet by mouth every 6 (six) hours as needed., Disp: 10 tablet, Rfl: 0 .  Multiple Vitamin  (MULTIVITAMIN) tablet, Take 1 tablet by mouth daily., Disp: , Rfl:  .  Pirfenidone (ESBRIET) 267 MG CAPS, Take as directed - 3 capsules by mouth three times a day with food, Disp: 270 capsule, Rfl: 6

## 2015-08-22 ENCOUNTER — Ambulatory Visit (INDEPENDENT_AMBULATORY_CARE_PROVIDER_SITE_OTHER)
Admission: RE | Admit: 2015-08-22 | Discharge: 2015-08-22 | Disposition: A | Discharge status: Home / Self Care | Payer: Medicare Other | Source: Ambulatory Visit | Attending: Pulmonary Disease | Admitting: Pulmonary Disease

## 2015-08-22 DIAGNOSIS — J84112 Idiopathic pulmonary fibrosis: Secondary | ICD-10-CM

## 2015-08-22 DIAGNOSIS — R0602 Shortness of breath: Secondary | ICD-10-CM | POA: Diagnosis not present

## 2015-08-25 ENCOUNTER — Telehealth: Payer: Self-pay | Admitting: Pulmonary Disease

## 2015-08-25 NOTE — Telephone Encounter (Signed)
Called and spoke with Jeani Hawking at [facility]. She states she received the office note from her ov with BQ on 08/09/15 but no O2 sats. I explained to her that I did not see any documentation showing qualifying walk test was performed. She stated that as of right now the patient's oxygen will not be paid for by insurance if walk test is not performed. I informed her that I would contact the pt to have her come in for a nurse visit so that we can perform her walk test.   LVM for patient to return call. (Needs to be scheduled for a qualifying O2 walk only).

## 2015-08-29 NOTE — Telephone Encounter (Signed)
Patient scheduled for Qualifying walk on Thursday 08/31/15 at 9am. Patient aware of appointment. Nothing further needed.

## 2015-08-31 ENCOUNTER — Ambulatory Visit (INDEPENDENT_AMBULATORY_CARE_PROVIDER_SITE_OTHER): Payer: Medicare Other | Admitting: Pulmonary Disease

## 2015-08-31 ENCOUNTER — Telehealth: Payer: Self-pay | Admitting: Pulmonary Disease

## 2015-08-31 DIAGNOSIS — J84112 Idiopathic pulmonary fibrosis: Secondary | ICD-10-CM

## 2015-08-31 NOTE — Progress Notes (Signed)
Walk test not needed today. We did walk the patient just in case (at no charge) so that we have this in our records again in case needed.  Amb Ox was done 08/10/15 at last OV, verifying that the patient no longer needs O2 during the daytime but there was no order placed at that time to d/c daytime O2. Pt states that she only wears O2 at night.  Will send to BQ to sign off on this visit  See telephone note 08/31/15

## 2015-08-31 NOTE — Telephone Encounter (Signed)
Yes, OK by me 

## 2015-08-31 NOTE — Telephone Encounter (Signed)
Walk test not needed today. We did walk the patient just in case (at no charge) so that we have this in our records again in case needed.  Amb Ox was done 08/10/15 at last OV, verifying that the patient no longer needs O2 during the daytime but there was no order placed at that time to d/c daytime O2. Pt states that she only wears O2 at night.   Called and spoke with Jeani Hawking at [facility], states that they have in their system that the patient is supposed to be using 3L O2 Cont 24/7. Maudie Mercury states that if patient did not qualify today or last OV then they need an order to d/c daytime O2 ONLY and an order for an ONO to be done. Pt's insurance will not cover O2 if ONO not done to qualify her.   Please advise Dr [place] Bells if you are okay with [place] placing order to stop/pick up daytime O2 and an order for ONO. Thanks.

## 2015-09-01 NOTE — Telephone Encounter (Signed)
Order will be placed to discontinue oxygen and ONO.

## 2015-09-18 ENCOUNTER — Telehealth: Payer: Self-pay | Admitting: Pulmonary Disease

## 2015-09-18 NOTE — Telephone Encounter (Signed)
Spoke with Tim at [facility]. States that they will no longer be providing Esbriet. Pt's prescription has been verbally transferred to [facility]. Nothing further was needed.

## 2015-09-21 ENCOUNTER — Telehealth: Payer: Self-pay | Admitting: Pulmonary Disease

## 2015-09-21 NOTE — Telephone Encounter (Signed)
Spoke with patient- states that her insurance has changed and she is required to get her meds through [facility].  Pt states that they are having to resubmit her Rx information and we should be receiving an Esbriet Rx order form to be signed.  Will send to [facility] to keep an eye out for this RX.

## 2015-09-25 NOTE — Telephone Encounter (Signed)
Courtney Faulkner, have you received this order form? Please advise.

## 2015-09-26 NOTE — Telephone Encounter (Signed)
Form received

## 2015-09-26 NOTE — Telephone Encounter (Signed)
Spoke with pt, advised that we have her esbriet form but we need an updated copy of her 2017 insurance card in order to fax her paperwork back.  Pt will bring by ins card tomorrow.  Form has been filled out to the best of my ability and is in my look at.  Will await new insurance info.

## 2015-09-26 NOTE — Telephone Encounter (Signed)
[phone], pt cb to follow up

## 2015-09-27 NOTE — Telephone Encounter (Signed)
New insurance Card is now in system.

## 2015-09-28 NOTE — Telephone Encounter (Signed)
Will send to Courtney Faulkner to make aware that new information is scanned into chart

## 2015-09-29 NOTE — Telephone Encounter (Signed)
Form placed back in BQ look at as this needs his signature. Will forward to [place] to f/u on

## 2015-10-03 ENCOUNTER — Telehealth: Payer: Self-pay

## 2015-10-03 NOTE — Telephone Encounter (Signed)
Pt returning call.Courtney Faulkner ° °

## 2015-10-03 NOTE — Telephone Encounter (Signed)
-----   Message from Juanito Doom, MD sent at 09/21/2015  3:54 PM EST ----- A, Please let her know that her ONO on RA was normal Thanks b

## 2015-10-03 NOTE — Telephone Encounter (Signed)
lmtcb X1 for pt  

## 2015-10-03 NOTE — Telephone Encounter (Signed)
Caryl Pina has BQ signed this form? Thanks!

## 2015-10-03 NOTE — Telephone Encounter (Signed)
Spoke with the pt and notified of results of ONO on RAper BQ She verbalized understanding  Nothing further needed

## 2015-10-05 NOTE — Telephone Encounter (Signed)
Form has been signed and faxed.  Nothing further needed.

## 2015-10-10 ENCOUNTER — Encounter: Payer: Self-pay | Admitting: Pulmonary Disease

## 2015-10-20 ENCOUNTER — Ambulatory Visit (INDEPENDENT_AMBULATORY_CARE_PROVIDER_SITE_OTHER): Payer: Medicare Other | Admitting: Pulmonary Disease

## 2015-10-20 ENCOUNTER — Encounter: Payer: Self-pay | Admitting: Pulmonary Disease

## 2015-10-20 VITALS — BP 116/64 | HR 69 | Ht 64.0 in | Wt 124.0 lb

## 2015-10-20 DIAGNOSIS — J84112 Idiopathic pulmonary fibrosis: Secondary | ICD-10-CM

## 2015-10-20 LAB — PULMONARY FUNCTION TEST
DL/VA % pred: 68 %
DL/VA: 3.31 ml/min/mmHg/L
DLCO UNC % PRED: 32 %
DLCO cor % pred: 32 %
DLCO cor: 7.9 ml/min/mmHg
DLCO unc: 7.82 ml/min/mmHg
FEF 25-75 POST: 1.22 L/s
FEF 25-75 Pre: 1.64 L/sec
FEF2575-%CHANGE-POST: -25 %
FEF2575-%PRED-POST: 63 %
FEF2575-%Pred-Pre: 84 %
FEV1-%CHANGE-POST: -1 %
FEV1-%PRED-POST: 61 %
FEV1-%PRED-PRE: 62 %
FEV1-PRE: 1.25 L
FEV1-Post: 1.23 L
FEV1FVC-%CHANGE-POST: 1 %
FEV1FVC-%PRED-PRE: 107 %
FEV6-%Change-Post: -2 %
FEV6-%Pred-Post: 58 %
FEV6-%Pred-Pre: 59 %
FEV6-PRE: 1.47 L
FEV6-Post: 1.43 L
FEV6FVC-%Change-Post: 0 %
FEV6FVC-%PRED-PRE: 104 %
FEV6FVC-%Pred-Post: 104 %
FVC-%Change-Post: -2 %
FVC-%Pred-Post: 56 %
FVC-%Pred-Pre: 57 %
FVC-PRE: 1.47 L
FVC-Post: 1.44 L
POST FEV1/FVC RATIO: 86 %
Post FEV6/FVC ratio: 100 %
Pre FEV1/FVC ratio: 85 %
Pre FEV6/FVC Ratio: 100 %
RV % PRED: 68 %
RV: 1.42 L
TLC % pred: 55 %
TLC: 2.82 L

## 2015-10-20 NOTE — Progress Notes (Signed)
PFT done today. 

## 2015-10-20 NOTE — Assessment & Plan Note (Addendum)
Chrisanne continues to do very well. In fact, she's done so well and her pulmonary function testing has shown so little progression and her CT scan has shown so little progression that I question seriously whether or not we are truly dealing with IPF. Frankly, there is nothing about this case it looks like idiopathic pulmonary fibrosis, including the CT images. 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 However, she's done surprisingly well with Esbriet from a symptomatic standpoint so she is reluctant to stop it which is totally reasonable.  Plan: Continue Esbriet for now Repeat LFT next visit I'm going to request a copy of the pathology report from [facility] and run at [facility] from where apparently she had a mediastinal lymph node biopsy many years ago. I'm suspicious this will show evidence of sarcoidosis. Alternatively, we could consider performing a lung biopsy to try to prove that she has sarcoid, simply just a bronchoscopy with transbronchial biopsy might be helpful. We will consider this if the pathology from the mediastinal lymph node biopsy is unavailable.

## 2015-10-20 NOTE — Progress Notes (Signed)
Subjective:    Patient ID: Courtney Faulkner, female    DOB: 13-Aug-1951, 65 y.o.   MRN: [id number]  Synopsis: Former patient of Dr. Joya Faulkner with pulmonary fibrosis. Dr. Joya Faulkner summarized her clinical situation as follows: Pulmonary fibrosis and basilar bronchiectasis ?from microaspiration. CTA Chest 06/16/2012>>RUL fibrosis and bilateral LL fibrosis with traction bronchiectasis, No PE PFTs 06/16/2012 >> FeV1 39%  FVC 35%  TLC 41%  DLCO 24% Serology 06/16/2012>> IgE 165  ESR 44   PFTs 02/16/13:  FeV1 50% TLC 43%  DLCO 46%  Improved from 05/2012 ONO 11/30/2013: desaturation QHS 07/25/2014 NO desaturation with exertion, marked improvement on pirfenidone LFTs/CBC 07/25/2014>>Normal LFTs 11/28/2014>>normal February 2017 pulmonary function testing ratio 86%, FVC 1.44 L (56% predicted), total lung capacity 2.82 L (55% predicted), DLCO 7.82 (32% predicted) 08/28/2015 CT chest > bleitz, no progression in fibrosis, not favored to represent UIP 08/2015 ONO > 89% RA only 7 minutes   HPI Chief Complaint  Patient presents with  . Follow-up    review PFT.  pt has no breathing complaints today.  pt will need ONO ordered to see if she still qualifies for nocturnal 02 per [facility].      Courtney Faulkner has done well since the last visit. She says that her breathing has been fine except if she exerts herself heavily. Specifically, she says that if she does something like carrying groceries or heavy exercise she might feel short of breath. She did start swimming again. She's been doing this fairly frequently but she didn't this week because she was busy with other activities. She denies cough, chest congestion. She continues to take Esbriet and denies any sort of side effect like nausea vomiting diarrhea or weight loss.   Past Medical History  Diagnosis Date  . ICAO (internal carotid artery occlusion), left 12/15/2011  . 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 CVA (cerebral infarction) 12/15/2011  . Postinflammatory pulmonary fibrosis  (Courtney Faulkner)   . Stroke (Courtney Faulkner)   . Shortness of breath dyspnea     only with active exertional activity      Review of Systems     Objective:   Physical Exam  Filed Vitals:   10/20/15 1613  BP: 116/64  Pulse: 69  Height: 5' 4"  (1.626 m)  Weight: 124 lb (56.246 kg)  SpO2: 98%   Room air  Gen: well appearing HENT: OP clear, TM's clear, neck supple PULM: Crackles R base only, normal percussion CV: RRR, no mgr, trace edema GI: BS+, soft, nontender Derm: no cyanosis or rash Psyche: normal mood and affect  2013 CT scan of the chest images personally reviewed showing consolidative changes right upper lobe and the right base. 2017 high resolution CT chest images personally reviewed showing upper lobe bronchovascular centric fibrosis with bronchiectasis and scattered patchy groundglass in the bases without any consolidation. There is no honeycombing or traction bronchiectasis in the bases.     Assessment & Plan:  IPF (idiopathic pulmonary fibrosis) (Courtney Faulkner) Courtney Faulkner continues to do very well. In fact, she's done so well and her pulmonary function testing has shown so little progression and her CT scan has shown so little progression that I question seriously whether or not we are truly dealing with IPF. Frankly, there is nothing about this case it looks like idiopathic pulmonary fibrosis, including the CT images. 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 However, she's done surprisingly well with Esbriet from a symptomatic standpoint so she is reluctant to stop it which is totally reasonable.  Plan:  Continue Esbriet for now Repeat LFT next visit I'm going to request a copy of the pathology report from [facility] and run at [facility] from where apparently she had a mediastinal lymph node biopsy many years ago. I'm suspicious this will show evidence of sarcoidosis. Alternatively, we could consider performing a lung biopsy to try to prove that she has sarcoid, simply just a  bronchoscopy with transbronchial biopsy might be helpful. We will consider this if the pathology from the mediastinal lymph node biopsy is unavailable.     Current outpatient prescriptions:  .  aspirin 81 MG tablet, Take 81 mg by mouth daily., Disp: , Rfl:  .  metoprolol tartrate (LOPRESSOR) 25 MG tablet, Take 25 mg by mouth 2 (two) times daily., Disp: , Rfl:  .  Multiple Vitamin (MULTIVITAMIN) tablet, Take 1 tablet by mouth daily., Disp: , Rfl:  .  Pirfenidone (ESBRIET) 267 MG CAPS, Take as directed - 3 capsules by mouth three times a day with food, Disp: 270 capsule, Rfl: 6

## 2015-10-24 ENCOUNTER — Telehealth: Payer: Self-pay | Admitting: Pulmonary Disease

## 2015-10-24 DIAGNOSIS — J84112 Idiopathic pulmonary fibrosis: Secondary | ICD-10-CM

## 2015-10-24 DIAGNOSIS — J9611 Chronic respiratory failure with hypoxia: Secondary | ICD-10-CM

## 2015-10-24 LAB — PULMONARY FUNCTION TEST
DL/VA % PRED: 78 %
DL/VA: 3.75 ml/min/mmHg/L
DLCO COR % PRED: 37 %
DLCO cor: 9.1 ml/min/mmHg
DLCO unc % pred: 36 %
DLCO unc: 8.86 ml/min/mmHg
FEF 25-75 Post: 1.07 L/sec
FEF 25-75 Pre: 0.92 L/sec
FEF2575-%CHANGE-POST: 16 %
FEF2575-%PRED-POST: 56 %
FEF2575-%Pred-Pre: 48 %
FEV1-%CHANGE-POST: 3 %
FEV1-%Pred-Post: 60 %
FEV1-%Pred-Pre: 58 %
FEV1-PRE: 1.16 L
FEV1-Post: 1.2 L
FEV1FVC-%CHANGE-POST: 4 %
FEV1FVC-%PRED-PRE: 96 %
FEV6-%Change-Post: 0 %
FEV6-%Pred-Post: 61 %
FEV6-%Pred-Pre: 62 %
FEV6-POST: 1.5 L
FEV6-Pre: 1.52 L
FEV6FVC-%PRED-POST: 104 %
FEV6FVC-%Pred-Pre: 104 %
FVC-%CHANGE-POST: 0 %
FVC-%PRED-PRE: 59 %
FVC-%Pred-Post: 59 %
FVC-PRE: 1.52 L
FVC-Post: 1.5 L
POST FEV1/FVC RATIO: 80 %
PRE FEV6/FVC RATIO: 100 %
Post FEV6/FVC ratio: 100 %
Pre FEV1/FVC ratio: 76 %
RV % PRED: 51 %
RV: 1.07 L
TLC % pred: 49 %
TLC: 2.52 L

## 2015-10-24 NOTE — Telephone Encounter (Signed)
Reviewed patient's OV notes from 10-20-15:    Follow-up    review PFT. pt has no breathing complaints today. pt will need ONO ordered to see if she still qualifies for nocturnal 02 per [facility].          I have placed order for ONO to go to [facility] on ROOM AIR. Judeen Hammans is faxing order to [facility] and Maudie Mercury with [facility] is aware.

## 2015-10-31 DIAGNOSIS — J9611 Chronic respiratory failure with hypoxia: Secondary | ICD-10-CM | POA: Diagnosis not present

## 2015-10-31 DIAGNOSIS — J8410 Pulmonary fibrosis, unspecified: Secondary | ICD-10-CM | POA: Diagnosis not present

## 2015-11-09 ENCOUNTER — Telehealth: Payer: Self-pay

## 2015-11-09 NOTE — Telephone Encounter (Signed)
-----   Message from Juanito Doom, MD sent at 11/08/2015  4:46 PM EDT ----- A, Please let her know that her ONO was normal Thanks B

## 2015-11-09 NOTE — Telephone Encounter (Signed)
Spoke with pt, aware of results/recs.  Nothing further needed.  

## 2015-11-16 ENCOUNTER — Telehealth: Payer: Self-pay | Admitting: Pulmonary Disease

## 2015-11-16 NOTE — Telephone Encounter (Signed)
Spoke with Broadus John @ [facility]. He states that they have been trying to reach the pt to confirm that she is still taking Esbriet, but pt keeps hanging up on them when they try to speak with her. Informed him that per LOV note 10/20/15 pt is on and should continue Esbriet. Nothing further needed. Attempted to contact pt to advise her as to why [facility] was trying to contact her.  LMTCB

## 2015-11-17 NOTE — Telephone Encounter (Signed)
lmtcb X2 for pt to relay below recs.

## 2015-11-17 NOTE — Telephone Encounter (Signed)
Spoke with pt and advised her as to why [facility] had been calling and attempting to speak with her. Pt states that she did not talk to them "because they're trying to make more money". Advised pt that we did confirm she is still on Esbriet. Nothing further needed.

## 2015-11-17 NOTE — Telephone Encounter (Signed)
[phone] calling back

## 2015-11-21 ENCOUNTER — Telehealth: Payer: Self-pay | Admitting: Pulmonary Disease

## 2015-11-21 NOTE — Telephone Encounter (Signed)
Called number given for PA renewal for Esbriet and i was told that didn't have anyone there by that name.  Will await their call back.

## 2015-11-22 NOTE — Telephone Encounter (Signed)
Spoke with Rep and she is going to fax over PA renewal form for [place]  Will await fax

## 2015-11-23 NOTE — Telephone Encounter (Signed)
I have looked in BQ's look at and up front and this PA is not there. Caryl Pina do you have this or should we have this faxed again?

## 2015-11-23 NOTE — Telephone Encounter (Signed)
I still have not received PA for pt's Esbriet.  Thanks!

## 2015-11-24 NOTE — Telephone Encounter (Signed)
I called [facility] and spoke with [facility]. He reports it is just a form that needs to be signed. He is re faxing this to [phone]. Will await fax

## 2015-11-24 NOTE — Telephone Encounter (Signed)
Patient calling to check status of Esbriet PA.  Advised her that we are faxing forms and will contact her as soon as we have a response. Patient said that we could call [facility] at Prior Authorization phone number: [phone] if they do not respond to the fax. Will send message to [facility] for follow up

## 2015-11-24 NOTE — Telephone Encounter (Signed)
[phone], pt calling about this

## 2015-11-27 NOTE — Telephone Encounter (Signed)
Called [facility] PA number listed below and spoke with Jacqulyn Bath, Jacklynn Bue has been approved effective today through 11/26/2016. PA # [id number]  lmtcb X1 for pt to make aware.

## 2015-11-27 NOTE — Telephone Encounter (Signed)
Pt cb, infiormed her of esbriet being approved per previous message, pt verbalized understanding, nothing further needed

## 2015-11-29 ENCOUNTER — Telehealth: Payer: Self-pay | Admitting: Pulmonary Disease

## 2015-11-29 NOTE — Telephone Encounter (Signed)
Called and spoke with Courtney Faulkner. She was calling to make [place] aware that she was faxing ONO results to our office. I had her fax the results to [phone] to BQ's attention. She voiced understanding and had no further questions. Will await results. Nothing further questions.

## 2015-12-01 ENCOUNTER — Other Ambulatory Visit (HOSPITAL_COMMUNITY)
Admission: RE | Admit: 2015-12-01 | Discharge: 2015-12-01 | Disposition: A | Discharge status: Home / Self Care | Payer: Medicare Other | Source: Ambulatory Visit | Attending: Internal Medicine | Admitting: Internal Medicine

## 2015-12-01 ENCOUNTER — Other Ambulatory Visit: Payer: Self-pay | Admitting: Internal Medicine

## 2015-12-01 DIAGNOSIS — I6522 Occlusion and stenosis of left carotid artery: Secondary | ICD-10-CM | POA: Diagnosis not present

## 2015-12-01 DIAGNOSIS — Z01419 Encounter for gynecological examination (general) (routine) without abnormal findings: Secondary | ICD-10-CM | POA: Insufficient documentation

## 2015-12-01 DIAGNOSIS — I4892 Unspecified atrial flutter: Secondary | ICD-10-CM | POA: Diagnosis not present

## 2015-12-01 DIAGNOSIS — Z0000 Encounter for general adult medical examination without abnormal findings: Secondary | ICD-10-CM | POA: Diagnosis not present

## 2015-12-01 DIAGNOSIS — Z1389 Encounter for screening for other disorder: Secondary | ICD-10-CM | POA: Diagnosis not present

## 2015-12-01 DIAGNOSIS — H6122 Impacted cerumen, left ear: Secondary | ICD-10-CM | POA: Diagnosis not present

## 2015-12-01 DIAGNOSIS — Z1151 Encounter for screening for human papillomavirus (HPV): Secondary | ICD-10-CM | POA: Diagnosis not present

## 2015-12-01 DIAGNOSIS — G8191 Hemiplegia, unspecified affecting right dominant side: Secondary | ICD-10-CM | POA: Diagnosis not present

## 2015-12-01 DIAGNOSIS — Z79899 Other long term (current) drug therapy: Secondary | ICD-10-CM | POA: Diagnosis not present

## 2015-12-01 DIAGNOSIS — J84112 Idiopathic pulmonary fibrosis: Secondary | ICD-10-CM | POA: Diagnosis not present

## 2015-12-01 DIAGNOSIS — Z124 Encounter for screening for malignant neoplasm of cervix: Secondary | ICD-10-CM | POA: Diagnosis not present

## 2015-12-05 LAB — CYTOLOGY - PAP

## 2015-12-12 ENCOUNTER — Telehealth: Payer: Self-pay | Admitting: Pulmonary Disease

## 2015-12-12 DIAGNOSIS — J9611 Chronic respiratory failure with hypoxia: Secondary | ICD-10-CM

## 2015-12-12 NOTE — Telephone Encounter (Signed)
Spoke with [facility], Jeani Hawking unavailable.  She will have Jeani Hawking call back tomorrow.

## 2015-12-13 NOTE — Telephone Encounter (Signed)
Spoke with Jeani Hawking at [facility], wants to know if we received a copy of pt's ONO.  Jeani Hawking had pt do ONO to make sure that pt was still on correct amount of 02-pt actually did not desaturate during ono.   Lynn refaxing ONO to office for BQ to review.   Forwarding to my inbasket to look out for.

## 2015-12-13 NOTE — Telephone Encounter (Signed)
Called [facility], was advised Jeani Hawking is at lunch.  Will ask to call office back.

## 2015-12-13 NOTE — Telephone Encounter (Signed)
[phone], lynn cb

## 2015-12-14 NOTE — Telephone Encounter (Signed)
Per BQ, the ono shows that she does notneed nocturnal 02.  An order to d/c 02 has been placed.   lmtcb X1 for pt to make aware.

## 2015-12-14 NOTE — Telephone Encounter (Signed)
ONO received, placed in BQ's look-at for review. BQ please advise.  Thanks!

## 2015-12-14 NOTE — Telephone Encounter (Signed)
I'm confused, her O2 saturation was normal on this and it was on room air, right?

## 2015-12-14 NOTE — Telephone Encounter (Signed)
Didn't realize Caryl Pina had just left a message for the patient so called her again Did notice that pt has a named voice mail so left her a detailed message informing her [facility] should be contacting her about picking O2 equipment Will sign off

## 2016-01-08 ENCOUNTER — Encounter: Payer: Self-pay | Admitting: Pulmonary Disease

## 2016-01-18 ENCOUNTER — Ambulatory Visit (INDEPENDENT_AMBULATORY_CARE_PROVIDER_SITE_OTHER): Payer: Medicare Other | Admitting: Pulmonary Disease

## 2016-01-18 ENCOUNTER — Encounter: Payer: Self-pay | Admitting: Pulmonary Disease

## 2016-01-18 ENCOUNTER — Other Ambulatory Visit: Payer: Medicare Other

## 2016-01-18 VITALS — BP 122/64 | HR 55 | Ht 64.0 in | Wt 129.0 lb

## 2016-01-18 DIAGNOSIS — Z5181 Encounter for therapeutic drug level monitoring: Secondary | ICD-10-CM

## 2016-01-18 DIAGNOSIS — J849 Interstitial pulmonary disease, unspecified: Secondary | ICD-10-CM

## 2016-01-18 LAB — HEPATIC FUNCTION PANEL
ALBUMIN: 4.1 g/dL (ref 3.5–5.2)
ALK PHOS: 71 U/L (ref 39–117)
ALT: 11 U/L (ref 0–35)
AST: 17 U/L (ref 0–37)
Bilirubin, Direct: 0.1 mg/dL (ref 0.0–0.3)
TOTAL PROTEIN: 7.6 g/dL (ref 6.0–8.3)
Total Bilirubin: 0.4 mg/dL (ref 0.2–1.2)

## 2016-01-18 NOTE — Patient Instructions (Signed)
Take aspirin 2 pills twice a day for one month, then take 1 pill twice a day for one month, then stop. We will arrange a lung function test in August 2017 with a 6 min walk I will see you back in August 2017

## 2016-01-18 NOTE — Assessment & Plan Note (Signed)
As noted in my previous notes she has an ill-defined interstitial lung disease. I do not feel that this is usual interstitial pneumonitis or idiopathic pulmonary fibrosis. I think this is most likely burned out sarcoidosis.  So at this point I see no role for continuing treatment with pirfenidone.  Plan: Taper off pirfenidone over the next 3 months Repeat lung function testing in 3 months with an office visit If she has increasing symptoms or change in her pulmonary function testing then we will pursue a biopsy of her lungs I will once again request records from the hospital system and run at [place] where she had a mediastinal lymph node biopsy performed in the 1970s

## 2016-01-18 NOTE — Progress Notes (Signed)
   Subjective:    Patient ID: Courtney Faulkner, female    DOB: [birth year], 65 y.o.   MRN: [id number]  Synopsis: Former patient of Dr. Joya Gaskins with pulmonary fibrosis. Dr. Joya Gaskins summarized her clinical situation as follows: Pulmonary fibrosis and basilar bronchiectasis ?from microaspiration. CTA Chest 06/16/2012>>RUL fibrosis and bilateral LL fibrosis with traction bronchiectasis, No PE PFTs 06/16/2012 >> FeV1 39%  FVC 35%  TLC 41%  DLCO 24% Serology 06/16/2012>> IgE 165  ESR 44   PFTs 02/16/13:  FeV1 50% TLC 43%  DLCO 46%  Improved from 05/2012 ONO 11/30/2013: desaturation QHS 07/25/2014 NO desaturation with exertion, marked improvement on pirfenidone LFTs/CBC 07/25/2014>>Normal LFTs 11/28/2014>>normal February 2017 pulmonary function testing ratio 86%, FVC 1.44 L (56% predicted), total lung capacity 2.82 L (55% predicted), DLCO 7.82 (32% predicted) 08/28/2015 CT chest > bleitz, no progression in fibrosis, not favored to represent UIP 08/2015 ONO > 89% RA only 7 minutes   HPI Chief Complaint  Patient presents with  . Follow-up    pt doing well, no breathing complaints.  tolerating all medications well.     Courtney Faulkner has been doing well since the last visit.  She has not had any problems with her breathing.    She has not been as active as she would like.  She has been taking Esbriet three pills twice a day.     Past Medical History  Diagnosis Date  . ICAO (internal carotid artery occlusion), left 12/15/2011  . 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 CVA (cerebral infarction) 12/15/2011  . Postinflammatory pulmonary fibrosis ([place])   . Stroke ([place])   . Shortness of breath dyspnea     only with active exertional activity      Review of Systems     Objective:   Physical Exam  Filed Vitals:   01/18/16 1045  BP: 122/64  Pulse: 55  Height: '5\' 4"'$  (1.626 m)  Weight: 129 lb (58.514 kg)  SpO2: 96%   Room air  Gen: well appearing HENT: OP clear, TM's clear, neck supple PULM: Crackles R base  only, normal percussion CV: RRR, no mgr, trace edema GI: BS+, soft, nontender Derm: no cyanosis or rash Psyche: normal mood and affect      Assessment & Plan:  ILD (interstitial lung disease) ([place]) As noted in my previous notes she has an ill-defined interstitial lung disease. I do not feel that this is usual interstitial pneumonitis or idiopathic pulmonary fibrosis. I think this is most likely burned out sarcoidosis.  So at this point I see no role for continuing treatment with pirfenidone.  Plan: Taper off pirfenidone over the next 3 months Repeat lung function testing in 3 months with an office visit If she has increasing symptoms or change in her pulmonary function testing then we will pursue a biopsy of her lungs I will once again request records from the hospital system and run at [place] where she had a mediastinal lymph node biopsy performed in the 1970s     Current outpatient prescriptions:  .  aspirin 81 MG tablet, Take 81 mg by mouth daily., Disp: , Rfl:  .  metoprolol tartrate (LOPRESSOR) 25 MG tablet, Take 25 mg by mouth 2 (two) times daily., Disp: , Rfl:  .  Multiple Vitamin (MULTIVITAMIN) tablet, Take 1 tablet by mouth daily., Disp: , Rfl:  .  Pirfenidone (ESBRIET) 267 MG CAPS, Take as directed - 3 capsules by mouth three times a day with food, Disp: 270 capsule, Rfl: 6

## 2016-01-31 NOTE — Progress Notes (Signed)
Cardiology Office Note    Date:  02/01/2016   ID:  Courtney Faulkner, DOB [birth year], MRN [id number]  PCP:  Sinclair Grooms, MD  Cardiologist: Sinclair Grooms, MD   Chief Complaint  Patient presents with  . Congestive Heart Failure  . Hospitalization Follow-up    S/P Myxoma    History of Present Illness:  Courtney Faulkner is a 65 y.o. female follow-up of left atrial myxoma, embolic CVA, pulmonary fibrosis, and residua of stroke.  Courtney Faulkner is doing relatively well. The pulmonary therapy started by Dr. Joya Gaskins is being weaned and discontinued by Dr. Curt Jews. Weaning the medication as caused her no particular problem. She has not had syncope.  Past Medical History  Diagnosis Date  . ICAO (internal carotid artery occlusion), left 12/15/2011  . 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 CVA (cerebral infarction) 12/15/2011  . Postinflammatory pulmonary fibrosis ([place])   . Stroke ([place])   . Shortness of breath dyspnea     only with active exertional activity    Past Surgical History  Procedure Laterality Date  . Minimally-invasive excision of left atrial myxoma  12/24/2011    Dr Roxy Manns  . Abdominal angiogram  12/18/2011    Procedure: ABDOMINAL ANGIOGRAM;  Surgeon: Sinclair Grooms, MD;  Location: [facility] CATH LAB;  Service: Cardiovascular;;  . Colonoscopy with propofol N/A 02/13/2015    Procedure: COLONOSCOPY WITH PROPOFOL;  Surgeon: Garlan Fair, MD;  Location: [facility] ENDOSCOPY;  Service: Endoscopy;  Laterality: N/A;    Current Medications: Outpatient Prescriptions Prior to Visit  Medication Sig Dispense Refill  . aspirin 81 MG tablet Take 81 mg by mouth daily.    . metoprolol tartrate (LOPRESSOR) 25 MG tablet Take 25 mg by mouth 2 (two) times daily.    . Multiple Vitamin (MULTIVITAMIN) tablet Take 1 tablet by mouth daily.    . Pirfenidone (ESBRIET) 267 MG CAPS Take as directed - 3 capsules by mouth three times a day with food 270 capsule 6   No facility-administered medications prior to visit.       Allergies:   Review of patient's allergies indicates no known allergies.   Social History   Social History  . Marital Status: Married    Spouse Name: micheal  . Number of Children: 3  . 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 Smoking status: Former Smoker -- 0.50 packs/day for 20 years    Types: Cigarettes    Quit date: 08/26/2001  . Smokeless tobacco: Never Used  . Alcohol Use: No  . Drug Use: No  . Sexual Activity: Yes    Birth Control/ Protection: Post-menopausal   Other Topics Concern  . None   Social History Narrative   Patient is married.   Patient has a Human resources officer   Patient is right handed   Caffeine Use- quit intake 11/2011     Family History:  The patient's family history includes COPD in her father; Hypertension in her mother. There is no history of Coronary artery disease.   ROS:   Please see the history of present illness.    Dyspnea on exertion is improving MI: no medication side effects. She has bilateral cerumen impaction All other systems reviewed and are negative.   PHYSICAL EXAM:   VS:  BP 124/76 mmHg  Pulse 79  Ht 5\' 4"  (1.626 m)  Wt 130 lb (58.968 kg)  BMI  22.30 kg/m2   GEN: Well nourished, well developed, in no acute distress HEENT: normal Neck: no JVD, carotid bruits, or masses Cardiac: RRR; no murmurs, rubs, or gallops,no edema  Respiratory:  clear to auscultation bilaterally, normal work of breathing GI: soft, nontender, nondistended, + BS MS: no deformity or atrophy Skin: warm and dry, no rash Neuro:  Alert and Oriented x 3, Strength and sensation are intact Psych: euthymic mood, full affect  Wt Readings from Last 3 Encounters:  02/01/16 130 lb (58.968 kg)  01/18/16 129 lb (58.514 kg)  10/20/15 124 lb (56.246 kg)      Studies/Labs Reviewed:   EKG:  EKG  Is not performed  Recent Labs: 01/18/2016: ALT 11   Lipid Panel    Component  Value Date/Time   CHOL 134 12/16/2011 0311   TRIG 90 12/16/2011 0311   HDL 45 12/16/2011 0311   CHOLHDL 3.0 12/16/2011 0311   VLDL 18 12/16/2011 0311   LDLCALC 71 12/16/2011 0311    Additional studies/ records that were reviewed today include:  Reviewed prior hospital and testing data    ASSESSMENT:    1. IPF (idiopathic pulmonary fibrosis) ([place])   2. Atrial myxoma   3. ICAO (internal carotid artery occlusion), left   4. Left spastic hemiparesis ([facility])      PLAN:  In order of problems listed above:  1. This is followed by the pulmonary service. Therapy is being tapered and the patient's breathing has improved. She no longer requires oxygen. 2. We will exclude the possibility of recurrent myxoma with a repeat echocardiogram.    Medication Adjustments/Labs and Tests Ordered: Current medicines are reviewed at length with the patient today.  Concerns regarding medicines are outlined above.  Medication changes, Labs and Tests ordered today are listed in the Patient Instructions below. Patient Instructions  Medication Instructions:  Your physician recommends that you continue on your current medications as directed. Please refer to the Current Medication list given to you today.   Labwork: None ordered   Testing/Procedures: Your physician has requested that you have an echocardiogram. Echocardiography is a painless test that uses sound waves to create images of your heart. It provides your doctor with information about the size and shape of your heart and how well your heart's chambers and valves are working. This procedure takes approximately one hour. There are no restrictions for this procedure.---in April 2018    Follow-Up: Your physician wants you to follow-up in: May with Dr Gaspar Bidding will receive a reminder letter in the mail two months in advance. If you don't receive a letter, please call our office to schedule the follow-up appointment.   Any Other Special  Instructions Will Be Listed Below (If Applicable).     If you need a refill on your cardiac medications before your next appointment, please call your pharmacy.       Signed, Sinclair Grooms, MD  02/01/2016 10:51 AM    [facility] [address], [place], [place]  [address] Phone: [phone]; Fax: [phone]

## 2016-02-01 ENCOUNTER — Encounter: Payer: Self-pay | Admitting: Interventional Cardiology

## 2016-02-01 ENCOUNTER — Ambulatory Visit (INDEPENDENT_AMBULATORY_CARE_PROVIDER_SITE_OTHER): Payer: Medicare Other | Admitting: Interventional Cardiology

## 2016-02-01 VITALS — BP 124/76 | HR 79 | Ht 64.0 in | Wt 130.0 lb

## 2016-02-01 DIAGNOSIS — G8114 Spastic hemiplegia affecting left nondominant side: Secondary | ICD-10-CM

## 2016-02-01 DIAGNOSIS — I6522 Occlusion and stenosis of left carotid artery: Secondary | ICD-10-CM | POA: Diagnosis not present

## 2016-02-01 DIAGNOSIS — D151 Benign neoplasm of heart: Secondary | ICD-10-CM

## 2016-02-01 DIAGNOSIS — J84112 Idiopathic pulmonary fibrosis: Secondary | ICD-10-CM | POA: Diagnosis not present

## 2016-02-01 NOTE — Patient Instructions (Signed)
Medication Instructions:  Your physician recommends that you continue on your current medications as directed. Please refer to the Current Medication list given to you today.   Labwork: None ordered   Testing/Procedures: Your physician has requested that you have an echocardiogram. Echocardiography is a painless test that uses sound waves to create images of your heart. It provides your doctor with information about the size and shape of your heart and how well your heart's chambers and valves are working. This procedure takes approximately one hour. There are no restrictions for this procedure.---in April 2018    Follow-Up: Your physician wants you to follow-up in: May with Dr Gaspar Bidding will receive a reminder letter in the mail two months in advance. If you don't receive a letter, please call our office to schedule the follow-up appointment.   Any Other Special Instructions Will Be Listed Below (If Applicable).     If you need a refill on your cardiac medications before your next appointment, please call your pharmacy.

## 2016-02-19 ENCOUNTER — Other Ambulatory Visit (HOSPITAL_COMMUNITY): Payer: Medicare Other

## 2016-02-28 DIAGNOSIS — H1132 Conjunctival hemorrhage, left eye: Secondary | ICD-10-CM | POA: Diagnosis not present

## 2016-03-26 ENCOUNTER — Encounter (INDEPENDENT_AMBULATORY_CARE_PROVIDER_SITE_OTHER): Payer: Medicare Other | Admitting: Pulmonary Disease

## 2016-03-26 ENCOUNTER — Ambulatory Visit (INDEPENDENT_AMBULATORY_CARE_PROVIDER_SITE_OTHER): Payer: Medicare Other | Admitting: Pulmonary Disease

## 2016-03-26 DIAGNOSIS — J849 Interstitial pulmonary disease, unspecified: Secondary | ICD-10-CM

## 2016-03-26 DIAGNOSIS — J9611 Chronic respiratory failure with hypoxia: Secondary | ICD-10-CM

## 2016-03-26 LAB — PULMONARY FUNCTION TEST
DL/VA % pred: 87 %
DL/VA: 4.2 ml/min/mmHg/L
DLCO cor % pred: 37 %
DLCO cor: 9.17 ml/min/mmHg
DLCO unc % pred: 34 %
DLCO unc: 8.48 ml/min/mmHg
FEF 25-75 Post: 1.06 L/sec
FEF 25-75 Pre: 0.88 L/sec
FEF2575-%Change-Post: 20 %
FEF2575-%Pred-Post: 55 %
FEF2575-%Pred-Pre: 45 %
FEV1-%Change-Post: 4 %
FEV1-%Pred-Post: 60 %
FEV1-%Pred-Pre: 57 %
FEV1-Post: 1.2 L
FEV1-Pre: 1.14 L
FEV1FVC-%Change-Post: 4 %
FEV1FVC-%Pred-Pre: 95 %
FEV6-%Change-Post: 0 %
FEV6-%Pred-Post: 61 %
FEV6-%Pred-Pre: 61 %
FEV6-Post: 1.51 L
FEV6-Pre: 1.51 L
FEV6FVC-%Change-Post: 0 %
FEV6FVC-%Pred-Post: 104 %
FEV6FVC-%Pred-Pre: 103 %
FVC-%Change-Post: 0 %
FVC-%Pred-Post: 59 %
FVC-%Pred-Pre: 59 %
FVC-Post: 1.51 L
FVC-Pre: 1.51 L
Post FEV1/FVC ratio: 79 %
Post FEV6/FVC ratio: 100 %
Pre FEV1/FVC ratio: 76 %
Pre FEV6/FVC Ratio: 100 %
RV % pred: 60 %
RV: 1.25 L
TLC % pred: 52 %
TLC: 2.63 L

## 2016-04-23 ENCOUNTER — Ambulatory Visit (INDEPENDENT_AMBULATORY_CARE_PROVIDER_SITE_OTHER): Payer: Medicare Other | Admitting: Pulmonary Disease

## 2016-04-23 ENCOUNTER — Encounter: Payer: Self-pay | Admitting: Pulmonary Disease

## 2016-04-23 DIAGNOSIS — Z23 Encounter for immunization: Secondary | ICD-10-CM | POA: Diagnosis not present

## 2016-04-23 DIAGNOSIS — I6522 Occlusion and stenosis of left carotid artery: Secondary | ICD-10-CM | POA: Diagnosis not present

## 2016-04-23 DIAGNOSIS — D860 Sarcoidosis of lung: Secondary | ICD-10-CM

## 2016-04-23 NOTE — Patient Instructions (Signed)
We will see you back in February 2018 with a lung function test and 6 minute walk test Lt me know if you have problems with breathing between now and then please

## 2016-04-23 NOTE — Progress Notes (Signed)
Subjective:    Patient ID: Courtney Faulkner, female    DOB: 10-03-1950, 65 y.o.   MRN: [id number]  Synopsis: Former patient of Dr. Joya Faulkner with pulmonary fibrosis. Dr. Joya Faulkner summarized her clinical situation as follows: Pulmonary fibrosis and basilar bronchiectasis ?from microaspiration. CTA Chest 06/16/2012>>RUL fibrosis and bilateral LL fibrosis with traction bronchiectasis, No PE PFTs 06/16/2012 >> FeV1 39%  FVC 35%  TLC 41%  DLCO 24% Serology 06/16/2012>> IgE 165  ESR 44   PFTs 02/16/13:  FeV1 50% TLC 43%  DLCO 46%  Improved from 05/2012 ONO 11/30/2013: desaturation QHS 07/25/2014 NO desaturation with exertion, marked improvement on pirfenidone LFTs/CBC 07/25/2014>>Normal LFTs 11/28/2014>>normal February 2017 pulmonary function testing ratio 86%, FVC 1.44 L (56% predicted), total lung capacity 2.82 L (55% predicted), DLCO 7.82 (32% predicted) 08/28/2015 CT chest > bleitz, no progression in fibrosis, not favored to represent UIP 08/2015 ONO > 89% RA only 7 minutes August 2017 pulmonary function test ratio normal, FVC 1.51 L 59% predicted, total lung capacity 2.63 L 52% predicted, DLCO 8.48 34% predicted August 2017 6 minute walk 390 m, O2 saturation 98%  HPI Chief Complaint  Patient presents with  . Follow-up    Pt here after 6MWT and PFT. Pt states her breathing is unchanged since last OV in 12/2015. Pt denies cough and CP/tightness. Pt denies any new complaints.     Courtney Faulkner says that she feels wonderful since stopping Esbriet. She denies trouble breathing other than with heavy exertion which is baseline for her. No change in cough recently. Overall she feels well. She stopped taking Esbriet after our last visit.   Past Medical History:  Diagnosis Date  . 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 CVA (cerebral infarction) 12/15/2011  . ICAO (internal carotid artery occlusion), left 12/15/2011  . Postinflammatory pulmonary fibrosis (Courtney Faulkner)   . Shortness of breath dyspnea    only with active exertional  activity  . Stroke Courtney Faulkner)       Review of Systems     Objective:   Physical Exam  Vitals:   04/23/16 1039  BP: 110/78  Pulse: 60  SpO2: 100%  Weight: 133 lb 3.2 oz (60.4 kg)  Height: _0  (1.626 m)   Room air  Gen: well appearing HENT: OP clear, TM's clear, neck supple PULM: Crackles R base only, normal percussion CV: RRR, no mgr, trace edema GI: BS+, soft, nontender Derm: no cyanosis or rash Psyche: normal mood and affect      Assessment & Plan:  Pulmonary sarcoidosis (Courtney Faulkner) I believe that Courtney Faulkner has pulmonary sarcoidosis which is "burnt out". When you review the images from her CT chest the scarring pattern looks nothing like usual interstitial pneumonitis. Further, her disease has not progressed since 2013 which is unusual for idiopathic pulmonary fibrosis. Finally, she notes that she had immediate sternal lymph node biopsied in the 1970s. She is uncertain of the tissue type, but considering all of this the likelihood of pulmonary sarcoidosis is much more likely than idiopathic pulmonary fibrosis. Further, her chest imaging would be consistent with prior or active pulmonary sarcoid.  She has been stable since stopping pirfenidone. We will continue to monitor her off of this medicine.  Today we spent a lengthy amount of time talking about the diagnosis of sarcoidosis and how can affect any tissue in the body. Fortunately, her EKG from 2016 shows no evidence of cardiac involvement. We talked about the significance of ophthalmologic sarcoid today.  Plan: PFT February 2018 with 6 minute walk Stay  off of pirfenidone Annual ophthalmology visits Follow-up February 2018  > 50% of today's 27 minute visit spent face to face    Current Outpatient Prescriptions:  .  aspirin 81 MG tablet, Take 81 mg by mouth daily., Disp: , Rfl:  .  metoprolol tartrate (LOPRESSOR) 25 MG tablet, Take 25 mg by mouth 2 (two) times daily., Disp: , Rfl:  .  Multiple Vitamin (MULTIVITAMIN) tablet,  Take 1 tablet by mouth daily., Disp: , Rfl:

## 2016-04-23 NOTE — Assessment & Plan Note (Signed)
I believe that Courtney Faulkner has pulmonary sarcoidosis which is "burnt out". When you review the images from her CT chest the scarring pattern looks nothing like usual interstitial pneumonitis. Further, her disease has not progressed since 2013 which is unusual for idiopathic pulmonary fibrosis. Finally, she notes that she had immediate sternal lymph node biopsied in the 1970s. She is uncertain of the tissue type, but considering all of this the likelihood of pulmonary sarcoidosis is much more likely than idiopathic pulmonary fibrosis. Further, her chest imaging would be consistent with prior or active pulmonary sarcoid.  She has been stable since stopping pirfenidone. We will continue to monitor her off of this medicine.  Today we spent a lengthy amount of time talking about the diagnosis of sarcoidosis and how can affect any tissue in the body. Fortunately, her EKG from 2016 shows no evidence of cardiac involvement. We talked about the significance of ophthalmologic sarcoid today.  Plan: PFT February 2018 with 6 minute walk Stay off of pirfenidone Annual ophthalmology visits Follow-up February 2018  > 50% of today's 27 minute visit spent face to face

## 2016-06-14 DIAGNOSIS — L219 Seborrheic dermatitis, unspecified: Secondary | ICD-10-CM | POA: Diagnosis not present

## 2016-06-24 ENCOUNTER — Ambulatory Visit (INDEPENDENT_AMBULATORY_CARE_PROVIDER_SITE_OTHER): Payer: Medicare Other | Admitting: Nurse Practitioner

## 2016-06-24 ENCOUNTER — Encounter: Payer: Self-pay | Admitting: Nurse Practitioner

## 2016-06-24 VITALS — BP 108/70 | HR 60 | Ht 64.0 in | Wt 134.0 lb

## 2016-06-24 DIAGNOSIS — Z8673 Personal history of transient ischemic attack (TIA), and cerebral infarction without residual deficits: Secondary | ICD-10-CM | POA: Diagnosis not present

## 2016-06-24 DIAGNOSIS — I10 Essential (primary) hypertension: Secondary | ICD-10-CM | POA: Diagnosis not present

## 2016-06-24 DIAGNOSIS — I6522 Occlusion and stenosis of left carotid artery: Secondary | ICD-10-CM

## 2016-06-24 NOTE — Progress Notes (Signed)
[facility]  PATIENT: Courtney Faulkner DOB: 1951/03/02   REASON FOR VISIT: follow up for stroke, left spastic hemiparesis HISTORY FROM:patient    HISTORY OF PRESENT ILLNESS: HISTORY:63 year African American female with history of left MCA infarct from left carotid occlusion secondary to left atrial myxoma. In April 2013 History of atrial fibrillation status post-surgical ablation and remote smoking quit in 2003.   UPDATE 06/22/15 Courtney Faulkner, 65 year old female returns for follow-up. She was last seen in the office 06/21/2014 by Charlott Holler nurse practitioner.  She has not had any recurrent stroke or TIA symptoms since April 2013. She is tolerating daily aspirin well without bleeding or side effects. She continues to have mild right hand weakness as well as drags her right leg slightly when walking. She fell last week  going down the steps in her home. She states her blood pressure has been under good control, it is 110/76 in the office today. She is on a new medication for her pulmonary fibrosis which she thinks is helping; she only wears oxygen at night. Her last carotid duplex was in May 2014 . Will repeat.She remains active by walking. She denies any memory problems. She has no neurovascular complaints.  UPDATE 10/30/2017CM Courtney Faulkner, 65 year old female returns for yearly follow-up. She has not had recurrent stroke or TIA symptoms since April 2013 when she had a left MCA infarct from left carotid occlusion secondary to left atrial myxoma. She is tolerating baby aspirin without significant bruising or bleeding she [facility] [place] [facility], walking and [place]. Last carotid Doppler in November 2016 was unchanged from previous Doppler in 2014. She denies any memory problems she returns for reevaluation  REVIEW OF SYSTEMS: Full 14 system review of systems performed and notable only for those listed, all others are neg:  Constitutional: neg    Cardiovascular: neg Ear/Nose/Throat: neg  Skin: neg Eyes: neg Respiratory: neg Gastroitestinal: neg  Hematology/Lymphatic: neg  Endocrine: neg Musculoskeletal:neg Allergy/Immunology: neg Neurological: neg Psychiatric: neg Sleep : neg   ALLERGIES: No Known Allergies  HOME MEDICATIONS: Outpatient Medications Prior to Visit  Medication Sig Dispense Refill  . aspirin 81 MG tablet Take 81 mg by mouth daily.    . metoprolol tartrate (LOPRESSOR) 25 MG tablet Take 25 mg by mouth 2 (two) times daily.    . Multiple Vitamin (MULTIVITAMIN) tablet Take 1 tablet by mouth daily.     No facility-administered medications prior to visit.     PAST MEDICAL HISTORY: Past Medical History:  Diagnosis Date  . 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 CVA (cerebral infarction) 12/15/2011  . ICAO (internal carotid artery occlusion), left 12/15/2011  . Postinflammatory pulmonary fibrosis ([place])   . Shortness of breath dyspnea    only with active exertional activity  . Stroke [facility])     PAST SURGICAL HISTORY: Past Surgical History:  Procedure Laterality Date  . ABDOMINAL ANGIOGRAM  12/18/2011   Procedure: ABDOMINAL ANGIOGRAM;  Surgeon: Sinclair Grooms, MD;  Location: [facility] CATH LAB;  Service: Cardiovascular;;  . COLONOSCOPY WITH PROPOFOL N/A 02/13/2015   Procedure: COLONOSCOPY WITH PROPOFOL;  Surgeon: Garlan Fair, MD;  Location: [facility] ENDOSCOPY;  Service: Endoscopy;  Laterality: N/A;  . Minimally-Invasive Excision of Left Atrial Myxoma  12/24/2011   Dr Roxy Manns    FAMILY HISTORY: Family History  Problem Relation Age of Onset  . Coronary artery disease Neg Hx   . Hypertension Mother   . COPD Father   .  Diabetes      SOCIAL HISTORY: Social History   Social History  . Marital status: Married    Spouse name: micheal  . Number of children: 3  . 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 Smoking status: Former Smoker     Packs/day: 0.50    Years: 20.00    Types: Cigarettes    Quit date: 08/26/2001  . Smokeless tobacco: Never Used  . Alcohol use No  . Drug use: No  . Sexual activity: Yes    Birth control/ protection: Post-menopausal   Other Topics Concern  . Not on file   Social History Narrative   Patient is married.   Patient has a Human resources officer   Patient is right handed   Caffeine Use- quit intake 11/2011     PHYSICAL EXAM  Vitals:   06/24/16 1037  BP: 108/70  Pulse: 60  Weight: 134 lb (60.8 kg)  Height: 5' 4"  (1.626 m)   Body mass index is 23 kg/m. General: well developed, well nourished, seated, in no evident distress  Head: head normocephalic and atraumatic. Orohparynx benign  Neck: supple with no carotid  bruits  Cardiovascular: regular rate and rhythm, no murmurs  Musculoskeletal: no deformity  Skin: no rash/petichiae  Vascular: Normal pulses all extremities   Neurologic Exam  Mental Status: Awake and fully alert. Oriented to place and time. Recent and remote memory intact. Attention span, concentration and fund of knowledge appropriate. Mood and affect appropriate.  Cranial Nerves: Pupils equal, briskly reactive to light. Extraocular movements full without nystagmus. Visual fields full to confrontation. Hearing intact. Facial sensation intact. Face, tongue, palate moves normally and symmetrically.  Motor: Normal bulk and tone. Normal strength in all tested extremity muscles.  mild right facial droop as well as diminished fine motor skills on the right and orbits left over right upper extremity. Mild weakness of ankle dorsiflexors and right grip  Sensory: intact to light touch, pinprick and vibratory in all 4 extremities Coordination: Rapid alternating movements normal in all extremities. Finger-to-nose and heel-to-shin performed accurately bilaterally.  Gait and Station: Arises from chair without difficulty. Stance is normal. Gait demonstrates normal stride length.  Able to heel, toe walk without difficulty. Tandem gait is steady.No assistive device Reflexes: 1+ and symmetric. Toes downgoing.  DIAGNOSTIC DATA (LABS, IMAGING, TESTING) - ASSESSMENT AND PLAN  65 y.o. year old female  has a past medical history of ICAO (internal carotid artery occlusion), left (12/15/2011); Atrial myxoma (12/17/2011); CVA (cerebral infarction) (12/15/2011); Stroke (HCC);here to follow-up. The patient is a current patient of Dr. Leonie Man  who is out of the office today . This note is sent to the work in doctor.     PLAN: Keep systolic blood pressure less than 130, today's reading  108/70 continue Metoprolol Lipids are followed by PCP Carotid Doppler to be followed by cardiology Continue aspirin for  secondary stroke prevention No further stroke or TIA symptoms since 2013 If recurrent stroke symptoms occur, call 911 and proceed to the hospital Discharge from neurologic services at this time Dennie Bible, [facility], [facility], [facility] [address] [place], [place] [address] [phone]

## 2016-06-24 NOTE — Progress Notes (Signed)
I agree with the assessment and plan as directed by NP .WID was available for consultation.    Shakeisha Horine, MD

## 2016-06-24 NOTE — Patient Instructions (Signed)
Keep systolic blood pressure less than 130, today's reading  108/70 continue Metoprolol Lipids are followed by PCP Carotid Doppler to be followed by cardiology Continue aspirin for  secondary stroke prevention No further stroke or TIA symptoms since 2013 If recurrent stroke symptoms occur, call 911 and proceed to the hospital Discharge from neurologic services at this time

## 2016-06-27 DIAGNOSIS — M8580 Other specified disorders of bone density and structure, unspecified site: Secondary | ICD-10-CM | POA: Diagnosis not present

## 2016-06-27 DIAGNOSIS — I639 Cerebral infarction, unspecified: Secondary | ICD-10-CM | POA: Diagnosis not present

## 2016-06-27 DIAGNOSIS — I6522 Occlusion and stenosis of left carotid artery: Secondary | ICD-10-CM | POA: Diagnosis not present

## 2016-06-27 DIAGNOSIS — G8191 Hemiplegia, unspecified affecting right dominant side: Secondary | ICD-10-CM | POA: Diagnosis not present

## 2016-06-27 DIAGNOSIS — Z780 Asymptomatic menopausal state: Secondary | ICD-10-CM | POA: Diagnosis not present

## 2016-06-27 DIAGNOSIS — D151 Benign neoplasm of heart: Secondary | ICD-10-CM | POA: Diagnosis not present

## 2016-06-27 DIAGNOSIS — D860 Sarcoidosis of lung: Secondary | ICD-10-CM | POA: Diagnosis not present

## 2016-06-27 DIAGNOSIS — H6123 Impacted cerumen, bilateral: Secondary | ICD-10-CM | POA: Diagnosis not present

## 2016-07-25 DIAGNOSIS — Z0100 Encounter for examination of eyes and vision without abnormal findings: Secondary | ICD-10-CM | POA: Diagnosis not present

## 2016-07-25 DIAGNOSIS — H5340 Unspecified visual field defects: Secondary | ICD-10-CM | POA: Diagnosis not present

## 2016-07-25 DIAGNOSIS — H2513 Age-related nuclear cataract, bilateral: Secondary | ICD-10-CM | POA: Diagnosis not present

## 2016-09-10 DIAGNOSIS — Z780 Asymptomatic menopausal state: Secondary | ICD-10-CM | POA: Diagnosis not present

## 2016-09-10 DIAGNOSIS — M8588 Other specified disorders of bone density and structure, other site: Secondary | ICD-10-CM | POA: Diagnosis not present

## 2016-10-23 ENCOUNTER — Ambulatory Visit (INDEPENDENT_AMBULATORY_CARE_PROVIDER_SITE_OTHER): Payer: Medicare Other | Admitting: Pulmonary Disease

## 2016-10-23 ENCOUNTER — Encounter: Payer: Self-pay | Admitting: Pulmonary Disease

## 2016-10-23 ENCOUNTER — Ambulatory Visit: Payer: Medicare Other | Admitting: Pulmonary Disease

## 2016-10-23 VITALS — BP 122/66 | HR 75 | Ht 64.0 in | Wt 134.0 lb

## 2016-10-23 DIAGNOSIS — D860 Sarcoidosis of lung: Secondary | ICD-10-CM

## 2016-10-23 NOTE — Progress Notes (Signed)
Subjective:    Patient ID: Courtney Faulkner Faulkner, female    DOB: 1951/03/15, 65 y.o.   MRN: [id number]  Synopsis: Former patient of Dr. Joya Faulkner with pulmonary fibrosis. Dr. Joya Faulkner summarized her clinical situation as follows: Pulmonary fibrosis and basilar bronchiectasis ?from microaspiration. CTA Chest 06/16/2012>>RUL fibrosis and bilateral LL fibrosis with traction bronchiectasis, No PE PFTs 06/16/2012 >> FeV1 39%  FVC 35%  TLC 41%  DLCO 24% Serology 06/16/2012>> IgE 165  ESR 44   PFTs 02/16/13:  FeV1 50% TLC 43%  DLCO 46%  Improved from 05/2012 ONO 11/30/2013: desaturation QHS 07/25/2014 NO desaturation with exertion, marked improvement on pirfenidone LFTs/CBC 07/25/2014>>Normal LFTs 11/28/2014>>normal February 2017 pulmonary function testing ratio 86%, FVC 1.44 L (56% predicted), total lung capacity 2.82 L (55% predicted), DLCO 7.82 (32% predicted) 08/28/2015 CT chest > bleitz, no progression in fibrosis, not favored to represent UIP 08/2015 ONO > 89% RA only 7 minutes August 2017 pulmonary function test ratio normal, FVC 1.51 L 59% predicted, total lung capacity 2.63 L 52% predicted, DLCO 8.48 34% predicted August 2017 6 minute walk 390 m, O2 saturation 98%  February 2018 pulmonary function testing ratio 80%, FVC 1.50 L 59% predicted, total lung capacity 2.52 L 49% predicted, DLCO 8.8 636% predicted February 2018 6 minute walk 452 m, O2 saturation nadir 90% on room air  HPI Chief Complaint  Patient presents with  . Follow-up    review 54m and pft.  pt denies any breathing complaints today.    Courtney Faulkner has been walking a lot lately. The cold weather has slowed her down a little.  She walks about 2 miles at a time.  She walked in a 5 K recently.  She doesn't have trouble with dyspnea while walking.  She says that she will get tired and slow down, but she eosn't feel dyspnea.  She has been having more trouble sleeping lately.  No morning headaches. No snoring, no gasping for air.   Past  Medical History:  Diagnosis Date  . 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 CVA (cerebral infarction) 12/15/2011  . ICAO (internal carotid artery occlusion), left 12/15/2011  . Postinflammatory pulmonary fibrosis ([place]   . Shortness of breath dyspnea    only with active exertional activity  . Stroke ([facility]       Review of Systems     Objective:   Physical Exam  Vitals:   10/23/16 1425  BP: 122/66  Pulse: 75  SpO2: 96%  Weight: 134 lb (60.8 kg)  Height: 5' 4"  (1.626 m)   Room air  Gen: well appearing HENT: OP clear, TM's clear, neck supple PULM: CTA B, normal percussion CV: RRR, no mgr, trace edema GI: BS+, soft, nontender Derm: no cyanosis or rash Psyche: normal mood and affect      Assessment & Plan:  [place]ulmonary sarcoidosis ([place] This has been a stable interval for Courtney Faulkner Faulkner As stated previously, I believe she has sarcoidosis. She has been stable since stopping Esbriet. Her lung function testing today shows no evidence of progression of her fibrotic lung disease and her 6 and it walk distance is longer today than it ever been. She has been quite active with exercise and is doing well.  Plan: Immunizations are up-to-date Follow-up one year with lung function test in 6 minute walk    Current Outpatient Prescriptions:  .  aspirin 81 MG tablet, Take 81 mg by mouth daily., Disp: , Rfl:  .  metoprolol tartrate (LOPRESSOR) 25 MG tablet, Take 25 mg  by mouth 2 (two) times daily., Disp: , Rfl:  .  Multiple Vitamin (MULTIVITAMIN) tablet, Take 1 tablet by mouth daily., Disp: , Rfl:

## 2016-10-23 NOTE — Patient Instructions (Signed)
Let us know if you have any difficulty breathing in the next year Otherwise, we will plan on seeing you back in one year when we will order a lung function test in 6 [place].

## 2016-10-23 NOTE — Assessment & Plan Note (Signed)
This has been a stable interval for Courtney Faulkner. As stated previously, I believe she has sarcoidosis. She has been stable since stopping Esbriet. Her lung function testing today shows no evidence of progression of her fibrotic lung disease and her 6 and it walk distance is longer today than it ever been. She has been quite active with exercise and is doing well.  Plan: Immunizations are up-to-date Follow-up one year with lung function test in 6 minute walk

## 2016-10-28 ENCOUNTER — Other Ambulatory Visit: Payer: Self-pay | Admitting: Internal Medicine

## 2016-10-28 DIAGNOSIS — Z1231 Encounter for screening mammogram for malignant neoplasm of breast: Secondary | ICD-10-CM

## 2016-11-19 ENCOUNTER — Ambulatory Visit
Admission: RE | Admit: 2016-11-19 | Discharge: 2016-11-19 | Disposition: A | Discharge status: Home / Self Care | Payer: Medicare Other | Source: Ambulatory Visit | Attending: Internal Medicine | Admitting: Internal Medicine

## 2016-11-19 DIAGNOSIS — Z1231 Encounter for screening mammogram for malignant neoplasm of breast: Secondary | ICD-10-CM

## 2016-11-20 ENCOUNTER — Other Ambulatory Visit: Payer: Self-pay | Admitting: Internal Medicine

## 2016-11-20 DIAGNOSIS — R928 Other abnormal and inconclusive findings on diagnostic imaging of breast: Secondary | ICD-10-CM

## 2016-11-26 ENCOUNTER — Ambulatory Visit
Admission: RE | Admit: 2016-11-26 | Discharge: 2016-11-26 | Disposition: A | Discharge status: Home / Self Care | Payer: Medicare Other | Source: Ambulatory Visit | Attending: Internal Medicine | Admitting: Internal Medicine

## 2016-11-26 DIAGNOSIS — R928 Other abnormal and inconclusive findings on diagnostic imaging of breast: Secondary | ICD-10-CM

## 2016-12-19 ENCOUNTER — Ambulatory Visit (HOSPITAL_COMMUNITY): Payer: Medicare Other | Attending: Cardiovascular Disease

## 2016-12-19 ENCOUNTER — Other Ambulatory Visit: Payer: Self-pay

## 2016-12-19 DIAGNOSIS — Z8673 Personal history of transient ischemic attack (TIA), and cerebral infarction without residual deficits: Secondary | ICD-10-CM | POA: Insufficient documentation

## 2016-12-19 DIAGNOSIS — D151 Benign neoplasm of heart: Secondary | ICD-10-CM | POA: Diagnosis not present

## 2016-12-19 DIAGNOSIS — I081 Rheumatic disorders of both mitral and tricuspid valves: Secondary | ICD-10-CM | POA: Diagnosis not present

## 2017-01-08 NOTE — Progress Notes (Signed)
Cardiology Office Note    Date:  01/09/2017   ID:  SERENE KOPF, DOB 26-Apr-1951, MRN [id number]  PCP:  Belva Crome, MD  Cardiologist: Sinclair Grooms, MD   Chief Complaint  Patient presents with  . Shortness of Breath  . Follow-up    Left atrial myxoma resection 2013    History of Present Illness:  Courtney Faulkner is a 66 y.o. female with  follow-up of left atrial myxoma resection 9702, embolic CVA, pulmonary fibrosis, and residua of stroke.  Doing well. Recent echo did not demonstrate any recurrence of myxoma. No new neurological complaints, palpitations, dyspnea, syncope, or edema. She denies chest pain.  Past Medical History:  Diagnosis Date  . 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 CVA (cerebral infarction) 12/15/2011  . ICAO (internal carotid artery occlusion), left 12/15/2011  . Postinflammatory pulmonary fibrosis ([place])   . Shortness of breath dyspnea    only with active exertional activity  . Stroke [facility])     Past Surgical History:  Procedure Laterality Date  . ABDOMINAL ANGIOGRAM  12/18/2011   Procedure: ABDOMINAL ANGIOGRAM;  Surgeon: Sinclair Grooms, MD;  Location: [facility] CATH LAB;  Service: Cardiovascular;;  . COLONOSCOPY WITH PROPOFOL N/A 02/13/2015   Procedure: COLONOSCOPY WITH PROPOFOL;  Surgeon: Garlan Fair, MD;  Location: [facility] ENDOSCOPY;  Service: Endoscopy;  Laterality: N/A;  . Minimally-Invasive Excision of Left Atrial Myxoma  12/24/2011   Dr Roxy Manns    Current Medications: Outpatient Medications Prior to Visit  Medication Sig Dispense Refill  . aspirin 81 MG tablet Take 81 mg by mouth daily.    . Multiple Vitamin (MULTIVITAMIN) tablet Take 1 tablet by mouth daily.    . metoprolol tartrate (LOPRESSOR) 25 MG tablet Take 25 mg by mouth 2 (two) times daily.     No facility-administered medications prior to visit.      Allergies:   Patient has no known allergies.   Social History   Social History  . Marital status: Married    Spouse name: micheal  .  Number of children: 3  . 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 Smoking status: Former Smoker    Packs/day: 0.50    Years: 20.00    Types: Cigarettes    Quit date: 08/26/2001  . Smokeless tobacco: Never Used  . Alcohol use No  . Drug use: No  . Sexual activity: Yes    Birth control/ protection: Post-menopausal   Other Topics Concern  . None   Social History Narrative   Patient is married.   Patient has a Human resources officer   Patient is right handed   Caffeine Use- quit intake 11/2011     Family History:  The patient's family history includes COPD in her father; Hypertension in her mother.   ROS:   Please see the history of present illness.    Still some difficulty with word finding but generally doing quite well. She has been released by neurology.  All other systems reviewed and are negative.   PHYSICAL EXAM:   VS:  BP 104/70   Pulse 64   Ht 5\' 4"  (1.626 m)   Wt 136 lb (61.7 kg)   SpO2 94%   BMI 23.34 kg/m    GEN: Well nourished, well developed, in no acute distress  HEENT: normal  Neck: no JVD, carotid bruits, or masses Cardiac: RRR; no murmurs, rubs,  or gallops,no edema  Respiratory:  clear to auscultation bilaterally, normal work of breathing GI: soft, nontender, nondistended, + BS MS: no deformity or atrophy  Skin: warm and dry, no rash Neuro:  Alert and Oriented x 3, Strength and sensation are intact Psych: euthymic mood, full affect  Wt Readings from Last 3 Encounters:  01/09/17 136 lb (61.7 kg)  10/23/16 134 lb (60.8 kg)  06/24/16 134 lb (60.8 kg)      Studies/Labs Reviewed:   EKG:  EKG  Normal sinus rhythm, left atrial abnormality, nonspecific ST-T wave change, and left axis deviation.  Recent Labs: 01/18/2016: ALT 11   Lipid Panel    Component Value Date/Time   CHOL 134 12/16/2011 0311   TRIG 90 12/16/2011 0311   HDL 45 12/16/2011 0311   CHOLHDL 3.0  12/16/2011 0311   VLDL 18 12/16/2011 0311   LDLCALC 71 12/16/2011 0311    Additional studies/ records that were reviewed today include:  12/19/16: Echocardiogram Study Conclusions  - Left ventricle: The cavity size was normal. Systolic function was   normal. The estimated ejection fraction was in the range of 50%   to 55%. Wall motion was normal; there were no regional wall   motion abnormalities. Features are consistent with a pseudonormal   left ventricular filling pattern, with concomitant abnormal   relaxation and increased filling pressure (grade 2 diastolic   dysfunction). Doppler parameters are consistent with high   ventricular filling pressure. - Aortic valve: Transvalvular velocity was within the normal range.   There was no stenosis. There was no regurgitation. - Mitral valve: Transvalvular velocity was within the normal range.   There was no evidence for stenosis. There was mild regurgitation. - Left atrium: The atrium was severely dilated. - Right ventricle: The cavity size was normal. Wall thickness was   normal. Systolic function was normal. - Atrial septum: No defect or patent foramen ovale was identified. - Tricuspid valve: There was mild regurgitation. - Pulmonary arteries: Systolic pressure was within the normal   range. PA peak pressure: 30 mm Hg (S).   ASSESSMENT:    1. Atrial myxoma   2. Essential hypertension   3. ICAO (internal carotid artery occlusion), left   4. Pulmonary sarcoidosis ([place])   5. History of CVA (cerebrovascular accident)      PLAN:  In order of problems listed above:  1. No evidence of recurrence on serial echoes over the past 5 years. Assumption is that we are in the clear. Discussed this. She will still need to have intermittent follow-up. Perhaps next echo should be done in 3-5 years. Plan clinical follow-up in 2 years. 2. Excellent control on low-dose medication. 3. Near complete neurological recovery. 4. No longer requiring  chronic O2 therapy. Followed by Dr. Curt Jews.  Clinical observation. Call if palpitations, dyspnea, chest pain, or syncope. Plan clinical follow-up in 1.5-2 years.    Medication Adjustments/Labs and Tests Ordered: Current medicines are reviewed at length with the patient today.  Concerns regarding medicines are outlined above.  Medication changes, Labs and Tests ordered today are listed in the Patient Instructions below. Patient Instructions  Medication Instructions:  None  Labwork: None  Testing/Procedures: None  Follow-Up: Your physician wants you to follow-up in: 2 years with Dr. Tamala Julian.  You will receive a reminder letter in the mail two months in advance. If you don't receive a letter, please call our office to schedule the follow-up appointment.   Any Other Special Instructions Will Be Listed Below (If  Applicable).     If you need a refill on your cardiac medications before your next appointment, please call your pharmacy.      Signed, Sinclair Grooms, MD  01/09/2017 9:39 AM    [facility] [place], [place], [place]  [address] Phone: [phone]; Fax: [phone]

## 2017-01-09 ENCOUNTER — Encounter: Payer: Self-pay | Admitting: Interventional Cardiology

## 2017-01-09 ENCOUNTER — Ambulatory Visit (INDEPENDENT_AMBULATORY_CARE_PROVIDER_SITE_OTHER): Payer: Medicare Other | Admitting: Interventional Cardiology

## 2017-01-09 VITALS — BP 104/70 | HR 64 | Ht 64.0 in | Wt 136.0 lb

## 2017-01-09 DIAGNOSIS — I10 Essential (primary) hypertension: Secondary | ICD-10-CM

## 2017-01-09 DIAGNOSIS — Z8673 Personal history of transient ischemic attack (TIA), and cerebral infarction without residual deficits: Secondary | ICD-10-CM | POA: Diagnosis not present

## 2017-01-09 DIAGNOSIS — D860 Sarcoidosis of lung: Secondary | ICD-10-CM

## 2017-01-09 DIAGNOSIS — D151 Benign neoplasm of heart: Secondary | ICD-10-CM | POA: Diagnosis not present

## 2017-01-09 DIAGNOSIS — I6522 Occlusion and stenosis of left carotid artery: Secondary | ICD-10-CM | POA: Diagnosis not present

## 2017-01-09 MED ORDER — METOPROLOL TARTRATE 25 MG PO TABS: 25.0000 mg | ORAL_TABLET | Freq: Two times a day (BID) | ORAL | 3 refills | Status: DC

## 2017-01-09 NOTE — Patient Instructions (Signed)
Medication Instructions:  None  Labwork: None  Testing/Procedures: None  Follow-Up: Your physician wants you to follow-up in: 2 years with Dr. Tamala Julian.  You will receive a reminder letter in the mail two months in advance. If you don't receive a letter, please call our office to schedule the follow-up appointment.   Any Other Special Instructions Will Be Listed Below (If Applicable).     If you need a refill on your cardiac medications before your next appointment, please call your pharmacy.

## 2017-05-27 DIAGNOSIS — L219 Seborrheic dermatitis, unspecified: Secondary | ICD-10-CM | POA: Diagnosis not present

## 2017-05-27 DIAGNOSIS — Z23 Encounter for immunization: Secondary | ICD-10-CM | POA: Diagnosis not present

## 2017-07-02 ENCOUNTER — Other Ambulatory Visit: Payer: Self-pay | Admitting: Internal Medicine

## 2017-07-02 DIAGNOSIS — R1901 Right upper quadrant abdominal swelling, mass and lump: Secondary | ICD-10-CM

## 2017-07-07 ENCOUNTER — Ambulatory Visit
Admission: RE | Admit: 2017-07-07 | Discharge: 2017-07-07 | Disposition: A | Discharge status: Home / Self Care | Payer: Medicare Other | Source: Ambulatory Visit | Attending: Internal Medicine | Admitting: Internal Medicine

## 2017-07-07 DIAGNOSIS — R1901 Right upper quadrant abdominal swelling, mass and lump: Secondary | ICD-10-CM

## 2017-10-23 ENCOUNTER — Ambulatory Visit: Payer: Medicare Other | Admitting: Pulmonary Disease

## 2017-10-23 ENCOUNTER — Ambulatory Visit (INDEPENDENT_AMBULATORY_CARE_PROVIDER_SITE_OTHER): Payer: Medicare Other | Admitting: Pulmonary Disease

## 2017-10-23 DIAGNOSIS — D860 Sarcoidosis of lung: Secondary | ICD-10-CM | POA: Diagnosis not present

## 2017-10-23 LAB — PULMONARY FUNCTION TEST
DL/VA % pred: 81 %
DL/VA: 3.82 ml/min/mmHg/L
DLCO unc % pred: 36 %
DLCO unc: 8.39 ml/min/mmHg
FEF 25-75 Post: 1.05 L/sec
FEF 25-75 Pre: 1.18 L/sec
FEF2575-%Change-Post: -11 %
FEF2575-%PRED-PRE: 65 %
FEF2575-%Pred-Post: 58 %
FEV1-%Change-Post: 0 %
FEV1-%PRED-POST: 67 %
FEV1-%PRED-PRE: 68 %
FEV1-POST: 1.27 L
FEV1-PRE: 1.27 L
FEV1FVC-%CHANGE-POST: 2 %
FEV1FVC-%Pred-Pre: 102 %
FEV6-%CHANGE-POST: -3 %
FEV6-%PRED-PRE: 69 %
FEV6-%Pred-Post: 66 %
FEV6-PRE: 1.59 L
FEV6-Post: 1.54 L
FEV6FVC-%PRED-PRE: 104 %
FEV6FVC-%Pred-Post: 104 %
FVC-%Change-Post: -2 %
FVC-%Pred-Post: 64 %
FVC-%Pred-Pre: 66 %
FVC-POST: 1.55 L
FVC-Pre: 1.59 L
POST FEV6/FVC RATIO: 100 %
Post FEV1/FVC ratio: 82 %
Pre FEV1/FVC ratio: 80 %
Pre FEV6/FVC Ratio: 100 %
RV % pred: 57 %
RV: 1.17 L
TLC % PRED: 54 %
TLC: 2.69 L

## 2017-10-23 NOTE — Progress Notes (Signed)
PFT done today. 

## 2017-11-05 ENCOUNTER — Ambulatory Visit: Payer: Medicare Other | Admitting: Pulmonary Disease

## 2017-11-05 ENCOUNTER — Encounter: Payer: Self-pay | Admitting: Pulmonary Disease

## 2017-11-05 ENCOUNTER — Ambulatory Visit (INDEPENDENT_AMBULATORY_CARE_PROVIDER_SITE_OTHER): Payer: Medicare Other | Admitting: *Deleted

## 2017-11-05 VITALS — BP 102/70 | HR 57 | Ht 63.0 in | Wt 135.4 lb

## 2017-11-05 DIAGNOSIS — D860 Sarcoidosis of lung: Secondary | ICD-10-CM | POA: Diagnosis not present

## 2017-11-05 DIAGNOSIS — J301 Allergic rhinitis due to pollen: Secondary | ICD-10-CM

## 2017-11-05 NOTE — Patient Instructions (Signed)
Pulmonary sarcoidosis: Because your lung function testing is stable I think we do not need to do any more of this for now We will plan on seeing you again in a year and will do a 6-minute walk test at that time  Allergic rhinitis: I recommend trying generic Zyrtec and Flonase for this  We will see you back in 1 year or sooner if needed

## 2017-11-05 NOTE — Progress Notes (Signed)
Subjective:    Patient ID: Courtney Faulkner, female    DOB: 14-Feb-1951, 67 y.o.   MRN: [id number]  Synopsis: Former patient of Dr. Joya Gaskins with sarcoidosis     HPI Chief Complaint  Patient presents with  . Follow-up    At Baseline no problems at this time   Honi says she is well.  She is walking 2 miles a day walking her dog.  She hikes around the neighborhood. She doesn't feel short    Past Medical History:  Diagnosis Date  . 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 CVA (cerebral infarction) 12/15/2011  . ICAO (internal carotid artery occlusion), left 12/15/2011  . Postinflammatory pulmonary fibrosis ([place])   . Shortness of breath dyspnea    only with active exertional activity  . Stroke [facility])       Review of Systems     Objective:   Physical Exam  Vitals:   11/05/17 0950  BP: 102/70  Pulse: (!) 57  SpO2: 100%  Weight: 135 lb 6.4 oz (61.4 kg)  Height: 5' 3"  (1.6 m)   Room air  Gen: well appearing HENT: OP clear, TM's clear, neck supple PULM: Crackles in bases bilaterally B, normal percussion CV: RRR, no mgr, trace edema GI: BS+, soft, nontender Derm: no cyanosis or rash Psyche: normal mood and affect   CTA Chest 06/16/2012>>RUL fibrosis and bilateral LL fibrosis with traction bronchiectasis, No PE  Serology 06/16/2012>> IgE 165  ESR 44    PFT PFTs 06/16/2012 >> FeV1 39%  FVC 35%  TLC 41%  DLCO 24% PFTs 02/16/13:  FeV1 50% TLC 43%  DLCO 46%  Improved from 05/2012 February 2017 pulmonary function testing ratio 86%, FVC 1.44 L (56% predicted), total lung capacity 2.82 L (55% predicted), DLCO 7.82 (32% predicted) August 2017 pulmonary function test ratio normal, FVC 1.51 L 59% predicted, total lung capacity 2.63 L 52% predicted, DLCO 8.48 34% predicted February 2018 pulmonary function testing ratio 80%, FVC 1.50 L 59% predicted, total lung capacity 2.52 L 49% predicted, DLCO 8.8 36% predicted Feb 2019 ratio 82%, FVC 1.55 L 64% predicted, total lung capacity 2.7 L 54%  predicted, DLCO 18.39 mL 36% predicted   ONO 11/30/2013: desaturation QHS  Imaging: 08/28/2015 CT chest > bleitz, no progression in fibrosis, not favored to represent UIP  ONO: 08/2015 ONO > 89% RA only 7 minutes    February 2018 6 minute walk 452 m, O2 saturation nadir 90% on room air February 2019 6-minute walk 432 m, O2 saturation nadir 96% on room air     Assessment & Plan:   Pulmonary sarcoidosis ([place])  Allergic rhinitis due to pollen, unspecified seasonality  Discussion: This has been a stable interval for Ms. Parish.  Her 6-minute walk distance and lung function testing are all unchanged which is a good thing.  She has sarcoidosis which has not progressed.  She also has some allergic rhinitis.  Plan: Pulmonary sarcoidosis: Because your lung function testing is stable I think we do not need to do any more of this for now We will plan on seeing you again in a year and will do a 6-minute walk test at that time  Allergic rhinitis: I recommend trying generic Zyrtec and Flonase for this  We will see you back in 1 year or sooner if needed    Current Outpatient Medications:  .  aspirin 81 MG tablet, Take 81 mg by mouth daily., Disp: , Rfl:  .  metoprolol tartrate (LOPRESSOR) 25 MG tablet,  Take 1 tablet (25 mg total) by mouth 2 (two) times daily., Disp: 180 tablet, Rfl: 3 .  Multiple Vitamin (MULTIVITAMIN) tablet, Take 1 tablet by mouth daily., Disp: , Rfl:

## 2017-11-05 NOTE — Progress Notes (Addendum)
Deleting previous results posted on 6 minute walk due to it showing all of patient's previous 6-min walks giving confusion from today's 6-min walk.  Results are posted on pulmonary/sleep tab in the 18min walk area.

## 2017-12-25 ENCOUNTER — Other Ambulatory Visit: Payer: Self-pay | Admitting: Internal Medicine

## 2017-12-25 DIAGNOSIS — Z1231 Encounter for screening mammogram for malignant neoplasm of breast: Secondary | ICD-10-CM

## 2018-01-21 ENCOUNTER — Ambulatory Visit
Admission: RE | Admit: 2018-01-21 | Discharge: 2018-01-21 | Disposition: A | Discharge status: Home / Self Care | Payer: Medicare Other | Source: Ambulatory Visit | Attending: Internal Medicine | Admitting: Internal Medicine

## 2018-01-21 DIAGNOSIS — Z1231 Encounter for screening mammogram for malignant neoplasm of breast: Secondary | ICD-10-CM

## 2018-12-09 ENCOUNTER — Ambulatory Visit: Payer: Medicare Other | Admitting: Pulmonary Disease

## 2018-12-09 ENCOUNTER — Ambulatory Visit: Payer: Medicare Other

## 2018-12-15 ENCOUNTER — Telehealth: Payer: Self-pay

## 2018-12-15 ENCOUNTER — Other Ambulatory Visit: Payer: Self-pay

## 2018-12-15 ENCOUNTER — Encounter: Payer: Self-pay | Admitting: Interventional Cardiology

## 2018-12-15 ENCOUNTER — Telehealth (INDEPENDENT_AMBULATORY_CARE_PROVIDER_SITE_OTHER): Payer: Medicare Other | Admitting: Interventional Cardiology

## 2018-12-15 VITALS — Ht 63.0 in

## 2018-12-15 DIAGNOSIS — Z7189 Other specified counseling: Secondary | ICD-10-CM

## 2018-12-15 DIAGNOSIS — I6522 Occlusion and stenosis of left carotid artery: Secondary | ICD-10-CM

## 2018-12-15 DIAGNOSIS — D860 Sarcoidosis of lung: Secondary | ICD-10-CM

## 2018-12-15 DIAGNOSIS — Z8673 Personal history of transient ischemic attack (TIA), and cerebral infarction without residual deficits: Secondary | ICD-10-CM

## 2018-12-15 DIAGNOSIS — D151 Benign neoplasm of heart: Secondary | ICD-10-CM

## 2018-12-15 DIAGNOSIS — I10 Essential (primary) hypertension: Secondary | ICD-10-CM

## 2018-12-15 NOTE — Progress Notes (Signed)
Virtual Visit via Video Note   This visit type was conducted due to national recommendations for restrictions regarding the COVID-19 Pandemic (e.g. social distancing) in an effort to limit this patient's exposure and mitigate transmission in our community.  Due to her co-morbid illnesses, this patient is at least at moderate risk for complications without adequate follow up.  This format is felt to be most appropriate for this patient at this time.  All issues noted in this document were discussed and addressed.  A limited physical exam was performed with this format.  Please refer to the patient's chart for her consent to telehealth for [facility].   Evaluation Performed:  Follow-up visit  Date:  12/15/2018   ID:  Courtney Faulkner, DOB 1951-01-01, MRN [id number]  Patient Location: Home Provider Location: Office  PCP:  Belva Crome, MD  Cardiologist:  No primary care provider on file.  Electrophysiologist:  None   Chief Complaint:  LA Myxoma  History of Present Illness:    Courtney Faulkner is a 68 y.o. female with  follow-up of left atrial myxoma resection 6761, embolic CVA, pulmonary fibrosis, and residua of stroke. Also possible h/o Sarcoidosis.  Anaja is doing well.  She feels dyspnea on exertion has tremendously improved.  She has no specific complaints.  There have been no neurological complaints.  She has not had palpitations, syncope, lower extremity swelling, chest pain, chills, or fever.  The patient does not have symptoms concerning for COVID-19 infection (fever, chills, cough, or new shortness of breath).    Past Medical History:  Diagnosis Date  . 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 CVA (cerebral infarction) 12/15/2011  . ICAO (internal carotid artery occlusion), left 12/15/2011  . Postinflammatory pulmonary fibrosis ([place])   . Shortness of breath dyspnea    only with active exertional activity  . Stroke [facility])    Past Surgical History:  Procedure Laterality Date  .  ABDOMINAL ANGIOGRAM  12/18/2011   Procedure: ABDOMINAL ANGIOGRAM;  Surgeon: Sinclair Grooms, MD;  Location: [facility] CATH LAB;  Service: Cardiovascular;;  . COLONOSCOPY WITH PROPOFOL N/A 02/13/2015   Procedure: COLONOSCOPY WITH PROPOFOL;  Surgeon: Garlan Fair, MD;  Location: [facility] ENDOSCOPY;  Service: Endoscopy;  Laterality: N/A;  . Minimally-Invasive Excision of Left Atrial Myxoma  12/24/2011   Dr Roxy Manns     Current Meds  Medication Sig  . aspirin 81 MG tablet Take 81 mg by mouth daily.  . metoprolol tartrate (LOPRESSOR) 25 MG tablet Take 1 tablet (25 mg total) by mouth 2 (two) times daily.  . Multiple Vitamin (MULTIVITAMIN) tablet Take 1 tablet by mouth daily.     Allergies:   Patient has no known allergies.   Social History   Tobacco Use  . Smoking status: Former Smoker    Packs/day: 0.50    Years: 20.00    Pack years: 10.00    Types: Cigarettes    Last attempt to quit: 08/26/2001    Years since quitting: 17.3  . Smokeless tobacco: Never Used  Substance Use Topics  . Alcohol use: No  . Drug use: No     Family Hx: The patient's family history includes COPD in her father; Diabetes in her unknown relative; Hypertension in her mother. There is no history of Coronary artery disease.  ROS:   Please see the history of present illness.    During young adulthood, she was diagnosed with sarcoid.  Dr. Lake Bells feels her interstitial lung disease is related to sarcoidosis  and not interstitial pulmonary fibrosis. All other systems reviewed and are negative.   Prior CV studies:   The following studies were reviewed today:  No recent imaging or functional data.  Labs/Other Tests and Data Reviewed:    EKG:  No ECG reviewed.  Recent Labs: No results found for requested labs within last 8760 hours.   Recent Lipid Panel Lab Results  Component Value Date/Time   CHOL 134 12/16/2011 03:11 AM   TRIG 90 12/16/2011 03:11 AM   HDL 45 12/16/2011 03:11 AM   CHOLHDL 3.0 12/16/2011 03:11 AM    LDLCALC 71 12/16/2011 03:11 AM    Wt Readings from Last 3 Encounters:  11/05/17 135 lb 6.4 oz (61.4 kg)  01/09/17 136 lb (61.7 kg)  10/23/16 134 lb (60.8 kg)     Objective:    Vital Signs:  Ht 5\' 3"  (1.6 m)   BMI 23.99 kg/m    VITAL SIGNS:  reviewed Stable in appearance without evidence of dyspnea.  No elevation in neck veins and no peripheral edema is visualized.  ASSESSMENT & PLAN:    1. Atrial myxoma   2. Pulmonary sarcoidosis ([place])   3. Essential hypertension   4. ICAO (internal carotid artery occlusion), left   5. History of CVA (cerebrovascular accident)   6. Educated About Covid-19 Virus Infection    PLAN:  1. No clinical evidence of recurrence.  She understands AN echo will need to be repeated at some point over the next 2 years.  We decided to see each other in 1 year and at that time determine when to perform the echocardiogram. 2. Followed by pulmonology, Dr. Lake Bells.  There has not been clinical cardiac evidence of sarcoid involvement. 3. I encouraged her to monitor BP at home.  I encouraged that she purchase a digital device.  Target blood pressure 130/80 mmHg.  Low-salt diet discussed. 4. No new neurological complaints.  Need to discuss when the next carotid Doppler should be performed. 5. Not addressed and no new symptoms 6. She understands social distancing.    COVID-19 Education: The signs and symptoms of COVID-19 were discussed with the patient and how to seek care for testing (follow up with PCP or arrange E-visit).  The importance of social distancing was discussed today.  Time:   Today, I have spent 12 minutes with the patient with telehealth technology discussing the above problems.     Medication Adjustments/Labs and Tests Ordered: Current medicines are reviewed at length with the patient today.  Concerns regarding medicines are outlined above.   Tests Ordered: No orders of the defined types were placed in this encounter.   Medication  Changes: No orders of the defined types were placed in this encounter.   Disposition:  Follow up in 1 year(s)  Signed, Sinclair Grooms, MD  12/15/2018 11:44 AM    [place]

## 2018-12-15 NOTE — Patient Instructions (Signed)

## 2018-12-15 NOTE — Telephone Encounter (Signed)
YOUR CARDIOLOGY TEAM HAS ARRANGED FOR AN E-VISIT FOR YOUR APPOINTMENT - PLEASE REVIEW IMPORTANT INFORMATION BELOW SEVERAL DAYS PRIOR TO YOUR APPOINTMENT  Due to the recent COVID-19 pandemic, we are transitioning in-person office visits to tele-medicine visits in an effort to decrease unnecessary exposure to our patients, their families, and staff. These visits are billed to your insurance just like a normal visit is. We also encourage you to sign up for MyChart if you have not already done so. You will need a smartphone if possible. For patients that do not have this, we can still complete the visit using a regular telephone but do prefer a smartphone to enable video when possible. You may have a family member that lives with you that can help. If possible, we also ask that you have a blood pressure cuff and scale at home to measure your blood pressure, heart rate and weight prior to your scheduled appointment. Patients with clinical needs that need an in-person evaluation and testing will still be able to come to the office if absolutely necessary. If you have any questions, feel free to call our office.     YOUR PROVIDER WILL BE USING THE FOLLOWING PLATFORM TO COMPLETE YOUR VISIT: Yes  . IF USING MYCHART - How to Download the MyChart App to Your SmartPhone   - If Apple, go to App Store and type in MyChart in the search bar and download the app. If Android, ask patient to go to Google Play Store and type in MyChart in the search bar and download the app. The app is free but as with any other app downloads, your phone may require you to verify saved payment information or Apple/Android password.  - You will need to then log into the app with your MyChart username and password, and select [facility] as your healthcare provider to link the account.  - When it is time for your visit, go to the MyChart app, find appointments, and click Begin Video Visit. Be sure to Select Allow for your device to access  the Microphone and Camera for your visit. You will then be connected, and your provider will be with you shortly.  **If you have any issues connecting or need assistance, please contact MyChart service desk [phone] ([phone])**  **If using a computer, in order to ensure the best quality for your visit, you will need to use either of the following Internet Browsers: Google Chrome or Microsoft Edge**  . IF USING DOXIMITY or [email or web address].ME - The staff will give you instructions on receiving your link to join the meeting the day of your visit.      2-3 DAYS BEFORE YOUR APPOINTMENT  You will receive a telephone call from one of our [facility] team members - your caller ID may say "Unknown caller." If this is a video visit, we will walk you through how to get the video launched on your phone. We will remind you check your blood pressure, heart rate and weight prior to your scheduled appointment. If you have an Apple Watch or Kardia, please upload any pertinent ECG strips the day before or morning of your appointment to MyChart. Our staff will also make sure you have reviewed the consent and agree to move forward with your scheduled tele-health visit.     THE DAY OF YOUR APPOINTMENT  Approximately 15 minutes prior to your scheduled appointment, you will receive a telephone call from one of HeartCare team - your caller ID may say "Unknown caller."    Our staff will confirm medications, vital signs for the day and any symptoms you may be experiencing. Please have this information available prior to the time of visit start. It may also be helpful for you to have a pad of paper and pen handy for any instructions given during your visit. They will also walk you through joining the smartphone meeting if this is a video visit.    CONSENT FOR TELE-HEALTH VISIT - PLEASE REVIEW  I hereby voluntarily request, consent and authorize [facility] and its employed or contracted physicians, physician assistants,  nurse practitioners or other licensed health care professionals (the Practitioner), to provide me with telemedicine health care services (the "Services") as deemed necessary by the treating Practitioner. I acknowledge and consent to receive the Services by the Practitioner via telemedicine. I understand that the telemedicine visit will involve communicating with the Practitioner through live audiovisual communication technology and the disclosure of certain medical information by electronic transmission. I acknowledge that I have been given the opportunity to request an in-person assessment or other available alternative prior to the telemedicine visit and am voluntarily participating in the telemedicine visit.  I understand that I have the right to withhold or withdraw my consent to the use of telemedicine in the course of my care at any time, without affecting my right to future care or treatment, and that the Practitioner or I may terminate the telemedicine visit at any time. I understand that I have the right to inspect all information obtained and/or recorded in the course of the telemedicine visit and may receive copies of available information for a reasonable fee.  I understand that some of the potential risks of receiving the Services via telemedicine include:  . Delay or interruption in medical evaluation due to technological equipment failure or disruption; . Information transmitted may not be sufficient (e.g. poor resolution of images) to allow for appropriate medical decision making by the Practitioner; and/or  . In rare instances, security protocols could fail, causing a breach of personal health information.  Furthermore, I acknowledge that it is my responsibility to provide information about my medical history, conditions and care that is complete and accurate to the best of my ability. I acknowledge that Practitioner's advice, recommendations, and/or decision may be based on factors not  within their control, such as incomplete or inaccurate data provided by me or distortions of diagnostic images or specimens that may result from electronic transmissions. I understand that the practice of medicine is not an exact science and that Practitioner makes no warranties or guarantees regarding treatment outcomes. I acknowledge that I will receive a copy of this consent concurrently upon execution via email to the email address I last provided but may also request a printed copy by calling the office of [facility].    I understand that my insurance will be billed for this visit.   I have read or had this consent read to me. . I understand the contents of this consent, which adequately explains the benefits and risks of the Services being provided via telemedicine.  . I have been provided ample opportunity to ask questions regarding this consent and the Services and have had my questions answered to my satisfaction. . I give my informed consent for the services to be provided through the use of telemedicine in my medical care  By participating in this telemedicine visit I agree to the above.  

## 2018-12-30 ENCOUNTER — Ambulatory Visit: Payer: Medicare Other | Admitting: Interventional Cardiology

## 2019-03-24 ENCOUNTER — Encounter: Payer: Self-pay | Admitting: Adult Health

## 2019-03-24 ENCOUNTER — Other Ambulatory Visit: Payer: Self-pay

## 2019-03-24 ENCOUNTER — Ambulatory Visit (INDEPENDENT_AMBULATORY_CARE_PROVIDER_SITE_OTHER): Payer: Medicare Other

## 2019-03-24 ENCOUNTER — Ambulatory Visit (INDEPENDENT_AMBULATORY_CARE_PROVIDER_SITE_OTHER): Payer: Medicare Other | Admitting: Adult Health

## 2019-03-24 VITALS — BP 100/64 | HR 71 | Temp 98.2°F | Ht 63.0 in | Wt 140.8 lb

## 2019-03-24 DIAGNOSIS — D860 Sarcoidosis of lung: Secondary | ICD-10-CM

## 2019-03-24 DIAGNOSIS — J310 Chronic rhinitis: Secondary | ICD-10-CM | POA: Diagnosis not present

## 2019-03-24 NOTE — Progress Notes (Signed)
Reviewed, agree 

## 2019-03-24 NOTE — Assessment & Plan Note (Signed)
Stable- no flare  Zyrtec daily As needed

## 2019-03-24 NOTE — Patient Instructions (Signed)
Continue on current regimen  Activity as tolerated.  Chest xray today  Follow up with Dr. Lake Bells or Rande Roylance NP in 1 year and As needed

## 2019-03-24 NOTE — Progress Notes (Signed)
_0  ID: Courtney Faulkner, female    DOB: March 31, 1951, 68 y.o.   MRN: [id number]  Chief Complaint  Patient presents with  . Follow-up    Sarcoid     Referring provider: Belva Crome, MD  HPI: 68 year old female former smoker followed for pulmonary sarcoidosis and chronic rhinitis Medical history significant for stroke  TEST/EVENTS :  CTA Chest 06/16/2012>>RUL fibrosis and bilateral LL fibrosis with traction bronchiectasis, No PE High-resolution CT chest December 2016 showed pulmonary parenchymal pattern of fibrosis unchanged from 2013 favors a postinflammatory etiology.  Serology 06/16/2012>> IgE 165  ESR 44    PFT PFTs 06/16/2012 >> FeV1 39%  FVC 35%  TLC 41%  DLCO 24% PFTs 02/16/13:  FeV1 50% TLC 43%  DLCO 46%  Improved from 05/2012 February 2017 pulmonary function testing ratio 86%, FVC 1.44 L (56% predicted), total lung capacity 2.82 L (55% predicted), DLCO 7.82 (32% predicted) August 2017 pulmonary function test ratio normal, FVC 1.51 L 59% predicted, total lung capacity 2.63 L 52% predicted, DLCO 8.48 34% predicted February 2018 pulmonary function testing ratio 80%, FVC 1.50 L 59% predicted, total lung capacity 2.52 L 49% predicted, DLCO 8.8 36% predicted Feb 2019 ratio 82%, FVC 1.55 L 64% predicted, total lung capacity 2.7 L 54% predicted, DLCO 18.39 mL 36% predicted   ONO 11/30/2013: desaturation QHS  Imaging: 08/28/2015 CT chest > bleitz, no progression in fibrosis, not favored to represent UIP  ONO: 08/2015 ONO > 89% RA only 7 minutes    February 2018 6 minute walk 452 m, O2 saturation nadir 90% on room air February 2019 6-minute walk 432 m, O2 saturation nadir 96% on room air   03/24/2019 Follow up : Sarcoid and Rhinitis  Patient presents for a one-year follow-up.  Patient has underlying pulmonary sarcoidosis.  CT chest most recently in 2016 showed parenchymal fibrosis stable since 2013.  Patient says overall her breathing is doing okay.  She does get  some shortness of breath with activity she says that she just rests for a few minutes and then goes back to her activity.  She has no dyspnea at rest.  No notable cough.  She denies any chest pain orthopnea PND or increased leg swelling. She is retired. Says she is very active, walks daily , does housework.  He does have some chronic rhinitis.  Uses Zyrtec as needed. Pneumovax and Prevnar vaccines are up-to-date.   No Known Allergies  Immunization History  Administered Date(s) Administered  . Influenza Split 06/30/2012, 03/26/2017  . Influenza, High Dose Seasonal PF 06/09/2013, 05/26/2018  . Influenza,inj,Quad PF,6+ Mos 05/12/2014, 06/26/2015, 04/23/2016  . Pneumococcal Conjugate-13 04/22/2014  . Pneumococcal Polysaccharide-23 06/30/2012    Past Medical History:  Diagnosis Date  . 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 CVA (cerebral infarction) 12/15/2011  . ICAO (internal carotid artery occlusion), left 12/15/2011  . Postinflammatory pulmonary fibrosis ([place])   . Shortness of breath dyspnea    only with active exertional activity  . Stroke [facility])     Tobacco History: Social History   Tobacco Use  Smoking Status Former Smoker  . Packs/day: 0.50  . Years: 20.00  . Pack years: 10.00  . Types: Cigarettes  . Quit date: 08/26/2001  . Years since quitting: 17.5  Smokeless Tobacco Never Used   Counseling given: Not Answered   Outpatient Medications Prior to Visit  Medication Sig Dispense Refill  . aspirin 81 MG tablet Take 81 mg by mouth daily.    . metoprolol tartrate (LOPRESSOR) 25  MG tablet Take 1 tablet (25 mg total) by mouth 2 (two) times daily. (Patient taking differently: Take 25 mg by mouth daily. ) 180 tablet 3  . Multiple Vitamin (MULTIVITAMIN) tablet Take 1 tablet by mouth daily.     No facility-administered medications prior to visit.      Review of Systems:   Constitutional:   No  weight loss, night sweats,  Fevers, chills, fatigue, or  lassitude.  HEENT:   No headaches,   Difficulty swallowing,  Tooth/dental problems, or  Sore throat,                No sneezing, itching, ear ache, nasal congestion, post nasal drip,   CV:  No chest pain,  Orthopnea, PND, swelling in lower extremities, anasarca, dizziness, palpitations, syncope.   GI  No heartburn, indigestion, abdominal pain, nausea, vomiting, diarrhea, change in bowel habits, loss of appetite, bloody stools.   Resp: No shortness of breath with exertion or at rest.  No excess mucus, no productive cough,  No non-productive cough,  No coughing up of blood.  No change in color of mucus.  No wheezing.  No chest wall deformity  Skin: no rash or lesions.  GU: no dysuria, change in color of urine, no urgency or frequency.  No flank pain, no hematuria   MS:  No joint pain or swelling.  No decreased range of motion.  No back pain.    Physical Exam  BP 100/64 (BP Location: Left Arm, Cuff Size: Normal)   Pulse 71   Temp 98.2 F (36.8 C) (Oral)   Ht _0  (1.6 m)   Wt 140 lb 12.8 oz (63.9 kg)   SpO2 96%   BMI 24.94 kg/m   GEN: A/Ox3; pleasant , NAD, well nourished    HEENT:  /AT,  EACs-clear, TMs-wnl, NOSE-clear, THROAT-clear, no lesions, no postnasal drip or exudate noted.   NECK:  Supple w/ fair ROM; no JVD; normal carotid impulses w/o bruits; no thyromegaly or nodules palpated; no lymphadenopathy.    RESP  BB crackles  no accessory muscle use, no dullness to percussion  CARD:  RRR, no m/r/g, no peripheral edema, pulses intact, no cyanosis or clubbing.  GI:   Soft & nt; nml bowel sounds; no organomegaly or masses detected.   Musco: Warm bil, no deformities or joint swelling noted.   Neuro: alert, no focal deficits noted.    Skin: Warm, no lesions or rashes    Lab Results:  CBC  BNP No results found for: BNP  ProBNP    Component Value Date/Time   PROBNP 1,066.0 (H) 01/02/2012 1035    Imaging: No results found.    PFT Results Latest Ref Rng & Units 10/23/2017 03/26/2016 10/24/2015  10/20/2015 02/16/2013  FVC-Pre L 1.59 1.51 1.52 1.47 -  FVC-Predicted Pre % 66 59 59 57 47  FVC-Post L 1.55 1.51 1.50 1.44 1.25  FVC-Predicted Post % 64 59 59 56 47  Pre FEV1/FVC % % 80 76 76 85 84  Post FEV1/FCV % % 82 79 80 86 78  FEV1-Pre L 1.27 1.14 1.16 1.25 1.04  FEV1-Predicted Pre % 68 57 58 62 50  FEV1-Post L 1.27 1.20 1.20 1.23 0.97  DLCO UNC% % 36 34 36 32 46  DLCO COR %Predicted % 81 87 78 68 75  TLC L 2.69 2.63 2.52 2.82 2.22  TLC % Predicted % 54 52 49 55 43  RV % Predicted % 57 60 51 68 49  No results found for: NITRICOXIDE      Assessment & Plan:   Pulmonary sarcoidosis ([place]) Clinically stable -  Check chest xray today  Continue with activity as tolerated   Plan  Patient Instructions  Continue on current regimen  Activity as tolerated.  Chest xray today  Follow up with Dr. Lake Bells or Izac Faulkenberry NP in 1 year and As needed       Chronic rhinitis Stable- no flare  Zyrtec daily As needed       Rexene Edison, NP 03/24/2019

## 2019-03-24 NOTE — Assessment & Plan Note (Signed)
Clinically stable -  Check chest xray today  Continue with activity as tolerated   Plan  Patient Instructions  Continue on current regimen  Activity as tolerated.  Chest xray today  Follow up with Dr. Lake Bells or Jelissa Espiritu NP in 1 year and As needed

## 2019-03-24 NOTE — Addendum Note (Signed)
Addended by: Parke Poisson E on: 03/24/2019 10:01 AM   Modules accepted: Orders

## 2019-10-10 ENCOUNTER — Ambulatory Visit: Payer: Medicare PPO | Attending: Internal Medicine

## 2019-10-10 DIAGNOSIS — Z23 Encounter for immunization: Secondary | ICD-10-CM | POA: Insufficient documentation

## 2019-10-10 NOTE — Progress Notes (Signed)
   Covid-19 Vaccination Clinic  Name:  Courtney Faulkner    MRN: [id number] DOB: Jul 31, 1951  10/10/2019  Courtney Faulkner was observed post Covid-19 immunization for 15 minutes without incidence. She was provided with Vaccine Information Sheet and instruction to access the V-Safe system.   Courtney Faulkner was instructed to call 911 with any severe reactions post vaccine: Marland Kitchen Difficulty breathing  . Swelling of your face and throat  . A fast heartbeat  . A bad rash all over your body  . Dizziness and weakness    Immunizations Administered    Name Date Dose VIS Date Route   Pfizer COVID-19 Vaccine 10/10/2019 11:36 AM 0.3 mL 08/06/2019 Intramuscular   Manufacturer: [place]   Lot: [id number]   [place]: [id number]

## 2019-11-02 ENCOUNTER — Ambulatory Visit: Payer: Medicare PPO | Attending: Internal Medicine

## 2019-11-02 DIAGNOSIS — Z23 Encounter for immunization: Secondary | ICD-10-CM | POA: Insufficient documentation

## 2019-11-02 NOTE — Progress Notes (Signed)
   Covid-19 Vaccination Clinic  Name:  ELCIE SITLER    MRN: [id number] DOB: [birth year]  11/02/2019  Ms. Wittekind was observed post Covid-19 immunization for 15 minutes without incident. She was provided with Vaccine Information Sheet and instruction to access the V-Safe system.   Ms. Bennion was instructed to call 911 with any severe reactions post vaccine: Marland Kitchen Difficulty breathing  . Swelling of face and throat  . A fast heartbeat  . A bad rash all over body  . Dizziness and weakness   Immunizations Administered    Name Date Dose VIS Date Route   Pfizer COVID-19 Vaccine 11/02/2019  2:39 PM 0.3 mL 08/06/2019 Intramuscular   Manufacturer: [place]   Lot: [id number]   [place]: [id number]

## 2019-12-23 ENCOUNTER — Other Ambulatory Visit: Payer: Self-pay | Admitting: Internal Medicine

## 2019-12-23 ENCOUNTER — Ambulatory Visit: Payer: Medicare PPO | Admitting: Interventional Cardiology

## 2019-12-23 DIAGNOSIS — M85859 Other specified disorders of bone density and structure, unspecified thigh: Secondary | ICD-10-CM

## 2019-12-23 DIAGNOSIS — Z1231 Encounter for screening mammogram for malignant neoplasm of breast: Secondary | ICD-10-CM

## 2019-12-28 NOTE — Progress Notes (Signed)
Cardiology Office Note:    Date:  12/29/2019   ID:  Courtney Faulkner, DOB 1951/04/20, MRN [id number]  PCP:  Courtney Cha, MD  Cardiologist:  Courtney Grooms, MD   Referring MD: Courtney Crome, MD   Chief Complaint  Patient presents with  . Advice Only    Left atrial myxoma resection 2013  . Sarcoidosis    History of Present Illness:    Courtney Faulkner is a 69 y.o. female with a hx of  left atrial myxomaresection 0000000, embolic CVA, pulmonary fibrosis, and residua of stroke, and  h/o Sarcoidosis.  She feels well.  No dyspnea.  Walking regularly. Denies syncope, palpitations, and edema. No orthopnea. Overall she feels well. She exercises, mostly walking, on a daily basis.  She has been safe during the Covid pandemic. Most of her family has been vaccinated.    Past Medical History:  Diagnosis Date  . 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 CVA (cerebral infarction) 12/15/2011  . ICAO (internal carotid artery occlusion), left 12/15/2011  . Postinflammatory pulmonary fibrosis ([place])   . Shortness of breath dyspnea    only with active exertional activity  . Stroke [facility]     Past Surgical History:  Procedure Laterality Date  . ABDOMINAL ANGIOGRAM  12/18/2011   Procedure: ABDOMINAL ANGIOGRAM;  Surgeon: Courtney Grooms, MD;  Location: [facility] CATH LAB;  Service: Cardiovascular;;  . COLONOSCOPY WITH PROPOFOL N/A 02/13/2015   Procedure: COLONOSCOPY WITH PROPOFOL;  Surgeon: Courtney Fair, MD;  Location: [facility] ENDOSCOPY;  Service: Endoscopy;  Laterality: N/A;  . Minimally-Invasive Excision of Left Atrial Myxoma  12/24/2011   Dr Courtney Faulkner    Current Medications: Current Meds  Medication Sig  . aspirin 81 MG tablet Take 81 mg by mouth daily.  Courtney Faulkner atorvastatin (LIPITOR) 10 MG tablet Take 10 mg by mouth every other day.  . metoprolol succinate (TOPROL-XL) 25 MG 24 hr tablet Take 25 mg by mouth daily.  . Multiple Vitamin (MULTIVITAMIN) tablet Take 1 tablet by mouth daily.     Allergies:    Patient has no known allergies.   Social History   Socioeconomic History  . Marital status: Married    Spouse name: Courtney Faulkner  . Number of children: 3  . Years of education: MA  . Highest education level: Not on file  Occupational History  . Occupation: Psychology Product manager: [employer]  Tobacco Use  . Smoking status: Former Smoker    Packs/day: 0.50    Years: 20.00    Pack years: 10.00    Types: Cigarettes    Quit date: 08/26/2001    Years since quitting: 18.3  . Smokeless tobacco: Never Used  Substance and Sexual Activity  . Alcohol use: No  . Drug use: No  . Sexual activity: Yes    Birth control/protection: Post-menopausal  Other Topics Concern  . Not on file  Social History Narrative   Patient is married.   Patient has a Human resources officer   Patient is right handed   Caffeine Use- quit intake 11/2011   Social Determinants of Health   Financial Resource Strain:   . Difficulty of Paying Living Expenses:   Food Insecurity:   . Worried About Charity fundraiser in the Last Year:   . Arboriculturist in the Last Year:   Transportation Needs:   . Film/video editor (Medical):   Courtney Faulkner Lack of Transportation (Non-Medical):   Physical Activity:   .  Days of Exercise per Week:   . Minutes of Exercise per Session:   Stress:   . Feeling of Stress :   Social Connections:   . Frequency of Communication with Friends and Family:   . Frequency of Social Gatherings with Friends and Family:   . Attends Religious Services:   . Active Member of Clubs or Organizations:   . Attends Archivist Meetings:   Courtney Faulkner Marital Status:      Family History: The patient's family history includes COPD in her father; Diabetes in her unknown relative; Hypertension in her mother. There is no history of Coronary artery disease.  ROS:   Please see the history of present illness.    No real complaints other than incisional itching at the site of thoracotomy. All other  systems reviewed and are negative.  EKGs/Labs/Other Studies Reviewed:    The following studies were reviewed today: 2D Doppler echocardiogram April 26/2018: Study Conclusions   - Left ventricle: The cavity size was normal. Systolic function was  normal. The estimated ejection fraction was in the range of 50%  to 55%. Wall motion was normal; there were no regional wall  motion abnormalities. Features are consistent with a pseudonormal  left ventricular filling pattern, with concomitant abnormal  relaxation and increased filling pressure (grade 2 diastolic  dysfunction). Doppler parameters are consistent with high  ventricular filling pressure.  - Aortic valve: Transvalvular velocity was within the normal range.  There was no stenosis. There was no regurgitation.  - Mitral valve: Transvalvular velocity was within the normal range.  There was no evidence for stenosis. There was mild regurgitation.  - Left atrium: The atrium was severely dilated.  - Right ventricle: The cavity size was normal. Wall thickness was  normal. Systolic function was normal.  - Atrial septum: No defect or patent foramen ovale was identified.  - Tricuspid valve: There was mild regurgitation.  - Pulmonary arteries: Systolic pressure was within the normal  range. PA peak pressure: 30 mm Hg (S).   EKG:  EKG normal sinus rhythm, leftward axis, short PR interval, nonspecific T wave abnormality. Poor R wave progression with incomplete right bundle. When compared to prior tracing from May 2018, the current PR interval is slightly shorter.  Recent Labs: No results found for requested labs within last 8760 hours.  Recent Lipid Panel    Component Value Date/Time   CHOL 134 12/16/2011 0311   TRIG 90 12/16/2011 0311   HDL 45 12/16/2011 0311   CHOLHDL 3.0 12/16/2011 0311   VLDL 18 12/16/2011 0311   LDLCALC 71 12/16/2011 0311    Physical Exam:    VS:  BP 98/68   Pulse 63   Ht 5\' 3"  (1.6 m)    Wt 136 lb (61.7 kg)   SpO2 96%   BMI 24.09 kg/m     Wt Readings from Last 3 Encounters:  12/29/19 136 lb (61.7 kg)  03/24/19 140 lb 12.8 oz (63.9 kg)  11/05/17 135 lb 6.4 oz (61.4 kg)     GEN: Weight is stable.. No acute distress HEENT: Normal NECK: No JVD. LYMPHATICS: No lymphadenopathy CARDIAC: RRR without murmur, gallop, or edema. VASCULAR:  Normal Pulses. No bruits. RESPIRATORY:  Clear to auscultation without rales, wheezing or rhonchi  ABDOMEN: Soft, non-tender, non-distended, No pulsatile mass, MUSCULOSKELETAL: No deformity  SKIN: Warm and dry NEUROLOGIC:  Alert and oriented x 3 PSYCHIATRIC:  Normal affect   ASSESSMENT:    1. Atrial myxoma   2. Pulmonary sarcoidosis (  [place])   3. Essential hypertension   4. ICAO (internal carotid artery occlusion), left   5. Educated about COVID-19 virus infection   6. Hyperlipidemia LDL goal <70    PLAN:    In order of problems listed above:  1. No clinical recurrence of left atrial myxoma. Plan to repeat the next echocardiogram in 2023. Last echocardiogram was done in 2018 and did not reveal evidence of recurrence. 2. The clinical diagnosis of sarcoid lung disease has been made by Dr. Lake Bells. No recent activity. There is a 15 to 20% incidence of cardiac sarcoid in patients with pulmonary sarcoidosis. No active symptoms cardiac wise and therefore no work-up. A occluded cardiac involvement is a prolonged PR interval and in this case the PR interval is relatively short. 3. Blood pressure is well controlled on metoprolol succinate 25 mg/day, low-salt diet, and exercise. 4. Not discussed. No new neurological complaints. 5. Vaccination has been received. Still practicing social distancing. 6. Continue atorvastatin 10 mg/day. The most recent LDL is not available. October 2020 value of 113 was noted however the more recent value of October 05, 2019 revealed a total cholesterol of 143. The LDL was not reported but clearly has to be less than  113.  Continue regular physical activity. Notify us if any cardiac complaints including syncope, dyspnea, swelling, and chest pain. Clinical follow-up in 1 year. Plan repeat echocardiogram 5 years since the most recent study done in 2018.   Medication Adjustments/Labs and Tests Ordered: Current medicines are reviewed at length with the patient today.  Concerns regarding medicines are outlined above.  Orders Placed This Encounter  Procedures  . EKG 12-Lead   No orders of the defined types were placed in this encounter.   Patient Instructions  Medication Instructions:  Your physician recommends that you continue on your current medications as directed. Please refer to the Current Medication list given to you today.  *If you need a refill on your cardiac medications before your next appointment, please call your pharmacy*   Lab Work: None If you have labs (blood work) drawn today and your tests are completely normal, you will receive your results only by: Courtney Faulkner MyChart Message (if you have MyChart) OR . A paper copy in the mail If you have any lab test that is abnormal or we need to change your treatment, we will call you to review the results.   Testing/Procedures: None   Follow-Up: At [facility], you and your health needs are our priority.  As part of our continuing mission to provide you with exceptional heart care, we have created designated Provider Care Teams.  These Care Teams include your primary Cardiologist (physician) and Advanced Practice Providers (APPs -  Physician Assistants and Nurse Practitioners) who all work together to provide you with the care you need, when you need it.  We recommend signing up for the patient portal called "MyChart".  Sign up information is provided on this After Visit Summary.  MyChart is used to connect with patients for Virtual Visits (Telemedicine).  Patients are able to view lab/test results, encounter notes, upcoming appointments, etc.   Non-urgent messages can be sent to your provider as well.   To learn more about what you can do with MyChart, go to [email or web address].    Your next appointment:   12 month(s)  The format for your next appointment:   In Person  Provider:   You may see Courtney Grooms, MD or one of the following Advanced Practice  Providers on your designated Care Team:    Truitt Merle, NP  Cecilie Kicks, NP  Kathyrn Drown, NP    Other Instructions      Signed, Courtney Grooms, MD  12/29/2019 4:08 PM    [place]

## 2019-12-29 ENCOUNTER — Ambulatory Visit: Payer: Medicare PPO | Admitting: Interventional Cardiology

## 2019-12-29 ENCOUNTER — Other Ambulatory Visit: Payer: Self-pay

## 2019-12-29 ENCOUNTER — Encounter: Payer: Self-pay | Admitting: Interventional Cardiology

## 2019-12-29 VITALS — BP 98/68 | HR 63 | Ht 63.0 in | Wt 136.0 lb

## 2019-12-29 DIAGNOSIS — D860 Sarcoidosis of lung: Secondary | ICD-10-CM | POA: Diagnosis not present

## 2019-12-29 DIAGNOSIS — I10 Essential (primary) hypertension: Secondary | ICD-10-CM | POA: Diagnosis not present

## 2019-12-29 DIAGNOSIS — D151 Benign neoplasm of heart: Secondary | ICD-10-CM

## 2019-12-29 DIAGNOSIS — E785 Hyperlipidemia, unspecified: Secondary | ICD-10-CM

## 2019-12-29 DIAGNOSIS — I6522 Occlusion and stenosis of left carotid artery: Secondary | ICD-10-CM

## 2019-12-29 DIAGNOSIS — Z8673 Personal history of transient ischemic attack (TIA), and cerebral infarction without residual deficits: Secondary | ICD-10-CM

## 2019-12-29 DIAGNOSIS — Z7189 Other specified counseling: Secondary | ICD-10-CM

## 2019-12-29 NOTE — Patient Instructions (Signed)

## 2020-01-20 ENCOUNTER — Other Ambulatory Visit: Payer: Self-pay

## 2020-01-20 ENCOUNTER — Ambulatory Visit
Admission: RE | Admit: 2020-01-20 | Discharge: 2020-01-20 | Disposition: A | Discharge status: Home / Self Care | Payer: Medicare PPO | Source: Ambulatory Visit | Attending: Internal Medicine | Admitting: Internal Medicine

## 2020-01-20 DIAGNOSIS — Z1231 Encounter for screening mammogram for malignant neoplasm of breast: Secondary | ICD-10-CM

## 2020-01-20 DIAGNOSIS — M85859 Other specified disorders of bone density and structure, unspecified thigh: Secondary | ICD-10-CM

## 2020-03-23 ENCOUNTER — Ambulatory Visit: Payer: Medicare Other | Admitting: Adult Health

## 2020-04-11 ENCOUNTER — Ambulatory Visit: Payer: Medicare PPO | Admitting: Adult Health

## 2020-04-12 ENCOUNTER — Ambulatory Visit: Payer: Medicare PPO | Admitting: Adult Health

## 2020-04-13 ENCOUNTER — Ambulatory Visit: Payer: Medicare PPO | Admitting: Adult Health

## 2020-04-18 ENCOUNTER — Other Ambulatory Visit: Payer: Self-pay

## 2020-04-18 ENCOUNTER — Encounter: Payer: Self-pay | Admitting: Adult Health

## 2020-04-18 ENCOUNTER — Ambulatory Visit: Payer: Medicare PPO | Admitting: Adult Health

## 2020-04-18 VITALS — BP 102/68 | HR 64 | Temp 97.4°F | Ht 63.0 in | Wt 137.4 lb

## 2020-04-18 DIAGNOSIS — D860 Sarcoidosis of lung: Secondary | ICD-10-CM

## 2020-04-18 DIAGNOSIS — J310 Chronic rhinitis: Secondary | ICD-10-CM

## 2020-04-18 NOTE — Assessment & Plan Note (Signed)
Under good control.  Use Zyrtec as needed

## 2020-04-18 NOTE — Progress Notes (Signed)
_0  ID: Courtney Faulkner, female    DOB: 1951/06/10, 69 y.o.   MRN: [id number]  Chief Complaint  Patient presents with  . Follow-up    Sarcoid     Referring provider: Belva Crome, MD  HPI: 69 year old female former smoker followed for pulmonary sarcoidosis with postinflammatory fibrosis and bronchiectasis, chronic rhinitis  Medical history significant for left atrial myxoma resection in 0272, embolic CVA, hypertension  TEST/EVENTS :  CTA Chest 06/16/2012>>RUL fibrosis and bilateral LL fibrosis with traction bronchiectasis, No PE High-resolution CT chest December 2016 showed pulmonary parenchymal pattern of fibrosis unchanged from 2013 favors a postinflammatory etiology.  Serology 06/16/2012>>IgE 165 ESR 44   PFT PFTs 06/16/2012 >>FeV1 39% FVC 35% TLC 41% DLCO 24% PFTs 02/16/13: FeV1 50% TLC 43% DLCO 46% Improved from 05/2012 February 2017 pulmonary function testing ratio 86%, FVC 1.44 L (56% predicted), total lung capacity 2.82 L (55% predicted), DLCO 7.82 (32% predicted) August 2017 pulmonary function test ratio normal, FVC 1.51 L 59% predicted, total lung capacity 2.63 L 52% predicted, DLCO 8.48 34% predicted February 2018 pulmonary function testing ratio 80%, FVC 1.50 L 59% predicted, total lung capacity 2.52 L 49% predicted, DLCO 8.8 36% predicted Feb2019 ratio 82%, FVC 1.55 L 64% predicted, total lung capacity 2.7 L 54% predicted, DLCO 18.39 mL 36% predicted   ONO 11/30/2013: desaturation QHS  Imaging: 08/28/2015 CT chest > bleitz, no progression in fibrosis, not favored to represent UIP  ONO: 08/2015 ONO > 89% RA only 7 minutes    February 2018 6 minute walk 452 m, O2 saturation nadir 90% on room air February 2019 6-minute walk 432 m, O2 saturation nadir 96% on room air  04/18/2020 Follow up: Sarcoid and Rhinitis  Patient presents for a 1 year follow-up.  Patient has underlying pulmonary sarcoidosis with post inflammatory fibrosis and  bronchiectasis.  Most recent CT chest in 2016 showed parenchymal fibrosis stable since 2013.  Overall patient says she has been doing well.  She stays active.  Goes for walks daily.  She does her own housework and is very independent.  She says her allergies have not been having any symptoms she has not had to take any Zyrtec.  She denies any flare of cough or wheezing. Chest x-ray July 2020 showed mildly progressive chronic pulmonary scarring consistent with sarcoidosis since 2016.   No Known Allergies  Immunization History  Administered Date(s) Administered  . Influenza Split 06/30/2012, 03/26/2017  . Influenza, High Dose Seasonal PF 06/09/2013, 05/26/2018  . Influenza,inj,Quad PF,6+ Mos 05/12/2014, 06/26/2015, 04/23/2016  . PFIZER SARS-COV-2 Vaccination 10/10/2019, 11/02/2019  . Pneumococcal Conjugate-13 04/22/2014  . Pneumococcal Polysaccharide-23 06/30/2012    Past Medical History:  Diagnosis Date  . 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 CVA (cerebral infarction) 12/15/2011  . ICAO (internal carotid artery occlusion), left 12/15/2011  . Postinflammatory pulmonary fibrosis ([place])   . Shortness of breath dyspnea    only with active exertional activity  . Stroke [facility])     Tobacco History: Social History   Tobacco Use  Smoking Status Former Smoker  . Packs/day: 0.50  . Years: 20.00  . Pack years: 10.00  . Types: Cigarettes  . Quit date: 08/26/2001  . Years since quitting: 18.6  Smokeless Tobacco Never Used   Counseling given: Not Answered   Outpatient Medications Prior to Visit  Medication Sig Dispense Refill  . aspirin 81 MG tablet Take 81 mg by mouth daily.    Marland Kitchen atorvastatin (LIPITOR) 10 MG tablet Take 10 mg by  mouth every other day.    . metoprolol succinate (TOPROL-XL) 25 MG 24 hr tablet Take 25 mg by mouth daily.    . Multiple Vitamin (MULTIVITAMIN) tablet Take 1 tablet by mouth daily.     No facility-administered medications prior to visit.     Review of Systems:    Constitutional:   No  weight loss, night sweats,  Fevers, chills, fatigue, or  lassitude.  HEENT:   No headaches,  Difficulty swallowing,  Tooth/dental problems, or  Sore throat,                No sneezing, itching, ear ache, nasal congestion, post nasal drip,   CV:  No chest pain,  Orthopnea, PND, swelling in lower extremities, anasarca, dizziness, palpitations, syncope.   GI  No heartburn, indigestion, abdominal pain, nausea, vomiting, diarrhea, change in bowel habits, loss of appetite, bloody stools.   Resp: No shortness of breath with exertion or at rest.  No excess mucus, no productive cough,  No non-productive cough,  No coughing up of blood.  No change in color of mucus.  No wheezing.  No chest wall deformity  Skin: no rash or lesions.  GU: no dysuria, change in color of urine, no urgency or frequency.  No flank pain, no hematuria   MS:  No joint pain or swelling.  No decreased range of motion.  No back pain.    Physical Exam  BP 102/68 (BP Location: Left Arm, Cuff Size: Normal)   Pulse 64   Temp (!) 97.4 F (36.3 C) (Oral)   Ht _0  (1.6 m)   Wt 137 lb 6.4 oz (62.3 kg)   SpO2 95%   BMI 24.34 kg/m   GEN: A/Ox3; pleasant , NAD, well nourished    HEENT:  [place]/AT,  NOSE-clear, THROAT-clear, no lesions, no postnasal drip or exudate noted.   NECK:  Supple w/ fair ROM; no JVD; normal carotid impulses w/o bruits; no thyromegaly or nodules palpated; no lymphadenopathy.    RESP  Clear  P & A; w/o, wheezes/ rales/ or rhonchi. no accessory muscle use, no dullness to percussion  CARD:  RRR, no m/r/g, no peripheral edema, pulses intact, no cyanosis or clubbing.  GI:   Soft & nt; nml bowel sounds; no organomegaly or masses detected.   Musco: Warm bil, no deformities or joint swelling noted.   Neuro: alert, no focal deficits noted.    Skin: Warm, no lesions or rashes    Lab Results:   BNP No results found for: BNP  ProBNP  Imaging: No results found.    PFT  Results Latest Ref Rng & Units 10/23/2017 03/26/2016 10/24/2015 10/20/2015 02/16/2013  FVC-Pre L 1.59 1.51 1.52 1.47 -  FVC-Predicted Pre % 66 59 59 57 47  FVC-Post L 1.55 1.51 1.50 1.44 1.25  FVC-Predicted Post % 64 59 59 56 47  Pre FEV1/FVC % % 80 76 76 85 84  Post FEV1/FCV % % 82 79 80 86 78  FEV1-Pre L 1.27 1.14 1.16 1.25 1.04  FEV1-Predicted Pre % 68 57 58 62 50  FEV1-Post L 1.27 1.20 1.20 1.23 0.97  DLCO uncorrected ml/min/mmHg 8.39 8.48 8.86 7.82 11.21  DLCO UNC% % 36 34 36 32 46  DLCO corrected ml/min/mmHg - 9.17 9.10 7.90 -  DLCO COR %Predicted % - 37 37 32 -  DLVA Predicted % 81 87 78 68 75  TLC L 2.69 2.63 2.52 2.82 2.22  TLC % Predicted % 54 52 49 55  43  RV % Predicted % 57 60 51 68 49    No results found for: NITRICOXIDE      Assessment & Plan:   Pulmonary sarcoidosis (HCC) Appears stable without any flare symptoms.  Patient does very well and remains active. Chest x-ray showed postinflammatory fibrosis changes consistent with her sarcoid on x-ray 2020. Will repeat chest x-ray and pulmonary function testing on return visit in 1 year.  Plan  Patient Instructions  Continue on current regimen  Activity as tolerated.  Follow up with Dr. Erin Fulling or Littleton Haub NP in 1 year with chest xray and PFT and As needed       Chronic rhinitis Under good control.  Use Zyrtec as needed     Rexene Edison, NP 04/18/2020

## 2020-04-18 NOTE — Assessment & Plan Note (Signed)
Appears stable without any flare symptoms.  Patient does very well and remains active. Chest x-ray showed postinflammatory fibrosis changes consistent with her sarcoid on x-ray 2020. Will repeat chest x-ray and pulmonary function testing on return visit in 1 year.  Plan  Patient Instructions  Continue on current regimen  Activity as tolerated.  Follow up with Dr. Erin Fulling or Konrad Hoak NP in 1 year with chest xray and PFT and As needed

## 2020-04-18 NOTE — Patient Instructions (Signed)
Continue on current regimen  Activity as tolerated.  Follow up with Dr. Erin Fulling or Mikyle Sox NP in 1 year with chest xray and PFT and As needed

## 2020-05-30 DIAGNOSIS — L910 Hypertrophic scar: Secondary | ICD-10-CM | POA: Diagnosis not present

## 2020-05-30 DIAGNOSIS — Z23 Encounter for immunization: Secondary | ICD-10-CM | POA: Diagnosis not present

## 2020-08-03 DIAGNOSIS — L910 Hypertrophic scar: Secondary | ICD-10-CM | POA: Diagnosis not present

## 2020-08-09 DIAGNOSIS — E785 Hyperlipidemia, unspecified: Secondary | ICD-10-CM | POA: Diagnosis not present

## 2020-08-09 DIAGNOSIS — Z7189 Other specified counseling: Secondary | ICD-10-CM | POA: Diagnosis not present

## 2020-08-09 DIAGNOSIS — I10 Essential (primary) hypertension: Secondary | ICD-10-CM | POA: Diagnosis not present

## 2020-08-09 DIAGNOSIS — Z1389 Encounter for screening for other disorder: Secondary | ICD-10-CM | POA: Diagnosis not present

## 2020-08-09 DIAGNOSIS — D860 Sarcoidosis of lung: Secondary | ICD-10-CM | POA: Diagnosis not present

## 2020-08-09 DIAGNOSIS — J84112 Idiopathic pulmonary fibrosis: Secondary | ICD-10-CM | POA: Diagnosis not present

## 2020-08-09 DIAGNOSIS — M85859 Other specified disorders of bone density and structure, unspecified thigh: Secondary | ICD-10-CM | POA: Diagnosis not present

## 2020-08-09 DIAGNOSIS — Z0000 Encounter for general adult medical examination without abnormal findings: Secondary | ICD-10-CM | POA: Diagnosis not present

## 2020-08-09 DIAGNOSIS — G8191 Hemiplegia, unspecified affecting right dominant side: Secondary | ICD-10-CM | POA: Diagnosis not present

## 2020-08-09 DIAGNOSIS — Z8673 Personal history of transient ischemic attack (TIA), and cerebral infarction without residual deficits: Secondary | ICD-10-CM | POA: Diagnosis not present

## 2020-08-30 DIAGNOSIS — H52203 Unspecified astigmatism, bilateral: Secondary | ICD-10-CM | POA: Diagnosis not present

## 2020-08-30 DIAGNOSIS — H25013 Cortical age-related cataract, bilateral: Secondary | ICD-10-CM | POA: Diagnosis not present

## 2020-08-30 DIAGNOSIS — H2513 Age-related nuclear cataract, bilateral: Secondary | ICD-10-CM | POA: Diagnosis not present

## 2020-11-13 DIAGNOSIS — Z01812 Encounter for preprocedural laboratory examination: Secondary | ICD-10-CM | POA: Diagnosis not present

## 2020-11-16 DIAGNOSIS — K6289 Other specified diseases of anus and rectum: Secondary | ICD-10-CM | POA: Diagnosis not present

## 2020-11-16 DIAGNOSIS — K648 Other hemorrhoids: Secondary | ICD-10-CM | POA: Diagnosis not present

## 2020-11-16 DIAGNOSIS — Z86010 Personal history of colonic polyps: Secondary | ICD-10-CM | POA: Diagnosis not present

## 2021-01-29 DIAGNOSIS — J029 Acute pharyngitis, unspecified: Secondary | ICD-10-CM | POA: Diagnosis not present

## 2021-02-06 DIAGNOSIS — Z1231 Encounter for screening mammogram for malignant neoplasm of breast: Secondary | ICD-10-CM | POA: Diagnosis not present

## 2021-02-06 DIAGNOSIS — G8191 Hemiplegia, unspecified affecting right dominant side: Secondary | ICD-10-CM | POA: Diagnosis not present

## 2021-02-06 DIAGNOSIS — I10 Essential (primary) hypertension: Secondary | ICD-10-CM | POA: Diagnosis not present

## 2021-02-06 DIAGNOSIS — Z8673 Personal history of transient ischemic attack (TIA), and cerebral infarction without residual deficits: Secondary | ICD-10-CM | POA: Diagnosis not present

## 2021-02-06 DIAGNOSIS — I6522 Occlusion and stenosis of left carotid artery: Secondary | ICD-10-CM | POA: Diagnosis not present

## 2021-02-27 NOTE — Progress Notes (Signed)
Cardiology Office Note:    Date:  02/28/2021   ID:  LOWELL MCGURK, DOB 1951-02-07, MRN [id number]  PCP:  Leeroy Cha, MD  Cardiologist:  Sinclair Grooms, MD   Referring MD: Leeroy Cha,*   Chief Complaint  Patient presents with   Follow-up    Left atrial myxoma     History of Present Illness:    Courtney Faulkner is a 70 y.o. female with a hx of left atrial myxoma resection 6720, embolic CVA with neurologic residua, and  h/o Sarcoidosis.   She is doing well.  No palpitations, syncope, orthopnea, edema, or chest pain.  No neurological complaints.  Past Medical History:  Diagnosis Date   Atrial myxoma 12/17/2011   CVA (cerebral infarction) 12/15/2011   ICAO (internal carotid artery occlusion), left 12/15/2011   Postinflammatory pulmonary fibrosis (HCC)    Shortness of breath dyspnea    only with active exertional activity   Stroke [facility])     Past Surgical History:  Procedure Laterality Date   ABDOMINAL ANGIOGRAM  12/18/2011   Procedure: ABDOMINAL ANGIOGRAM;  Surgeon: Sinclair Grooms, MD;  Location[facility] C[facility] LAB;  Service: Cardiovascular;;   COLONOSCOPY WITH PROPOFOL N/A 02/13/2015   Procedure: COLONOSCOPY WITH PROPOFOL;  Surgeon: Garlan Fair, MD;  Location: [facility] ENDOSCOPY;  Service: Endoscopy;  Laterality: N/A;   Minimally-Invasive Excision of Left Atrial Myxoma  12/24/2011   Dr Roxy Manns    Current Medications: No outpatient medications have been marked as taking for the 02/28/21 encounter (Office Visit) with Belva Crome, MD.     Allergies:   Patient has no known allergies.   Social History   Socioeconomic History   Marital status: Married    Spouse name: micheal   Number of children: 3   Years of education: MA   Highest education level: Not on file  Occupational History   Occupation: Occupational hygienist: [employer]  Tobacco Use   Smoking status: Former    Packs/day: 0.50    Years: 20.00    Pack years: 10.00     Types: Cigarettes    Quit date: 08/26/2001    Years since quitting: 19.5   Smokeless tobacco: Never  Substance and Sexual Activity   Alcohol use: No   Drug use: No   Sexual activity: Yes    Birth control/protection: Post-menopausal  Other Topics Concern   Not on file  Social History Narrative   Patient is married.   Patient has a Human resources officer   Patient is right handed   Caffeine Use- quit intake 11/2011   Social Determinants of Health   Financial Resource Strain: Not on file  Food Insecurity: Not on file  Transportation Needs: Not on file  Physical Activity: Not on file  Stress: Not on file  Social Connections: Not on file     Family History: The patient's family history includes COPD in her father; Diabetes in an other family member; Hypertension in her mother. There is no history of Coronary artery disease.  ROS:   Please see the history of present illness.    She walked this morning.  No limitations.  All other systems reviewed and are negative.  EKGs/Labs/Other Studies Reviewed:    The following studies were reviewed today: Last echocardiogram 2018.  EKG:  EKG normal sinus rhythm, left axis deviation, nonspecific T wave abnormality.  Compared to prior tracings, the PR interval is slightly longer.  No significant change has occurred  compared to 12/31/2019.  Recent Labs: No results found for requested labs within last 8760 hours.  Recent Lipid Panel    Component Value Date/Time   CHOL 134 12/16/2011 0311   TRIG 90 12/16/2011 0311   HDL 45 12/16/2011 0311   CHOLHDL 3.0 12/16/2011 0311   VLDL 18 12/16/2011 0311   LDLCALC 71 12/16/2011 0311    Physical Exam:    VS:  There were no vitals taken for this visit.    Wt Readings from Last 3 Encounters:  04/18/20 137 lb 6.4 oz (62.3 kg)  12/29/19 136 lb (61.7 kg)  03/24/19 140 lb 12.8 oz (63.9 kg)     GEN: Healthy-appearing. No acute distress HEENT: Normal NECK: No JVD. LYMPHATICS: No  lymphadenopathy CARDIAC: No murmur. RRR no gallop, or edema. VASCULAR:  Normal Pulses. No bruits. RESPIRATORY:  Clear to auscultation without rales, wheezing or rhonchi  ABDOMEN: Soft, non-tender, non-distended, No pulsatile mass, MUSCULOSKELETAL: No deformity  SKIN: Warm and dry NEUROLOGIC:  Alert and oriented x 3 PSYCHIATRIC:  Normal affect   ASSESSMENT:    1. Atrial myxoma   2. Pulmonary sarcoidosis ([place])   3. Essential hypertension   4. Hyperlipidemia LDL goal <70   5. ICAO (internal carotid artery occlusion), left    PLAN:    In order of problems listed above:  2D Doppler echocardiogram will be done sometime within the next 12 months. Followed by pulmonary Diastolic pressure is 90 today.  Systolic is perfect.  Low-salt diet.  Monitoring and pointing out that ideal blood pressure at her age is 130/80 mmHg or less.  She will keep an eye on it.  Continue exercise Continue low-dose statin therapy.  We will check KPN with the most recent lipid panel reveals.  It is usually done by her primary care. No neurological symptoms.  No bruits.  1 year follow-up.  Did not discuss about potential retirement.   Medication Adjustments/Labs and Tests Ordered: Current medicines are reviewed at length with the patient today.  Concerns regarding medicines are outlined above.  Orders Placed This Encounter  Procedures   EKG 12-Lead   No orders of the defined types were placed in this encounter.   There are no Patient Instructions on file for this visit.   Signed, Sinclair Grooms, MD  02/28/2021 8:07 AM    [place]

## 2021-02-28 ENCOUNTER — Other Ambulatory Visit: Payer: Self-pay

## 2021-02-28 ENCOUNTER — Encounter: Payer: Self-pay | Admitting: Interventional Cardiology

## 2021-02-28 ENCOUNTER — Ambulatory Visit: Payer: Medicare PPO | Admitting: Interventional Cardiology

## 2021-02-28 VITALS — BP 128/90 | HR 70 | Ht 64.0 in | Wt 135.6 lb

## 2021-02-28 DIAGNOSIS — I10 Essential (primary) hypertension: Secondary | ICD-10-CM | POA: Diagnosis not present

## 2021-02-28 DIAGNOSIS — E785 Hyperlipidemia, unspecified: Secondary | ICD-10-CM

## 2021-02-28 DIAGNOSIS — I6522 Occlusion and stenosis of left carotid artery: Secondary | ICD-10-CM

## 2021-02-28 DIAGNOSIS — D860 Sarcoidosis of lung: Secondary | ICD-10-CM | POA: Diagnosis not present

## 2021-02-28 DIAGNOSIS — D151 Benign neoplasm of heart: Secondary | ICD-10-CM | POA: Diagnosis not present

## 2021-02-28 NOTE — Patient Instructions (Signed)
Medication Instructions:  Your physician recommends that you continue on your current medications as directed. Please refer to the Current Medication list given to you today.  *If you need a refill on your cardiac medications before your next appointment, please call your pharmacy*   Lab Work: If you have labs (blood work) drawn today and your tests are completely normal, you will receive your results only by: [place] (if you have MyChart) OR A paper copy in the mail If you have any lab test that is abnormal or we need to change your treatment, we will call you to review the results.  Testing/Procedures: Your physician has requested that you have an echocardiogram. Echocardiography is a painless test that uses sound waves to create images of your heart. It provides your doctor with information about the size and shape of your heart and how well your heart's chambers and valves are working. This procedure takes approximately one hour. There are no restrictions for this procedure.  Follow-Up: At [facility], you and your health needs are our priority.  As part of our continuing mission to provide you with exceptional heart care, we have created designated Provider Care Teams.  These Care Teams include your primary Cardiologist (physician) and Advanced Practice Providers (APPs -  Physician Assistants and Nurse Practitioners) who all work together to provide you with the care you need, when you need it.  We recommend signing up for the patient portal called "MyChart".  Sign up information is provided on this After Visit Summary.  MyChart is used to connect with patients for Virtual Visits (Telemedicine).  Patients are able to view lab/test results, encounter notes, upcoming appointments, etc.  Non-urgent messages can be sent to your provider as well.   To learn more about what you can do with MyChart, go to [email or web address].    Your next appointment:   1 year(s)  The format for  your next appointment:   In Person  Provider:   You may see Sinclair Grooms, MD or one of the following Advanced Practice Providers on your designated Care Team:   Kathyrn Drown, NP

## 2021-04-24 ENCOUNTER — Telehealth: Payer: Self-pay | Admitting: Interventional Cardiology

## 2021-04-24 NOTE — Telephone Encounter (Signed)
She has not been tested for Amyloid. I don't think she has amyloid. Her stroke was related to her LA Myxoma and embolization.

## 2021-04-24 NOTE — Telephone Encounter (Signed)
Spoke with pt and made her aware of information from Dr. Smith.  Pt appreciative for call.  

## 2021-04-24 NOTE — Telephone Encounter (Signed)
Pt's aunt was just diagnosed with Amyloidosis, pt wants to know if this has anything to do with the stroke she had 9 years ago

## 2021-04-24 NOTE — Telephone Encounter (Signed)
Is there any concern for amyloidosis in this pt?  Pt concerned that aunt was recently diagnosed and this possibly caused her stoke 9 years ago.

## 2021-05-04 ENCOUNTER — Other Ambulatory Visit: Payer: Self-pay

## 2021-05-04 ENCOUNTER — Ambulatory Visit (INDEPENDENT_AMBULATORY_CARE_PROVIDER_SITE_OTHER): Payer: Medicare PPO

## 2021-05-04 ENCOUNTER — Encounter: Payer: Self-pay | Admitting: Primary Care

## 2021-05-04 ENCOUNTER — Ambulatory Visit: Payer: Medicare PPO | Admitting: Primary Care

## 2021-05-04 VITALS — BP 116/84 | HR 67 | Temp 98.1°F | Ht 64.0 in | Wt 135.8 lb

## 2021-05-04 DIAGNOSIS — D869 Sarcoidosis, unspecified: Secondary | ICD-10-CM | POA: Diagnosis not present

## 2021-05-04 DIAGNOSIS — D860 Sarcoidosis of lung: Secondary | ICD-10-CM

## 2021-05-04 NOTE — Assessment & Plan Note (Addendum)
-   Pulmonary fibrosis and bisilar bronchiectasis ? from microaspiration - Clinically stable without any acute respiratory symptoms - Needs routine follow-up CXR and PFTs  - Return in 1 year with Dr. Erin Fulling (new patient) or sooner if needed

## 2021-05-04 NOTE — Patient Instructions (Addendum)
Recommendations: Ensure you follow-up with opthalmology annually Call us if you develop any shortness of breath, chest discomfort or cough   Orders: CXR today (ordered) Pulmonary function testing first available (ordered)  Follow-up: 1 year with Dr. Erin Fulling (new-patient)  Sarcoidosis Sarcoidosis is a disease that can cause inflammation in many areas of the body. It most often affects the lungs (pulmonary sarcoidosis). Sarcoidosis can also affect the lymph nodes, liver, eyes, skin, heart, or any other body tissue. Normally, cells that are part of the body's disease-fighting system (immune system) attack harmful substances in the body, such as germs. This immune system response causes inflammation. After the harmful substance is destroyed, the inflammation goes away. When you have sarcoidosis, your immune system causes inflammation even when there are no harmful substances, and the inflammation does not go away. Sarcoidosis also causes cells from your immune system to form small lumps (granulomas) in the affected area of your body. What are the causes? The exact cause of sarcoidosis is not known.  If you have a family history of this disease (genetic predisposition), the immune system response that leads to inflammation may be triggered by something in your environment, such as: Bacteria or viruses. Metals. Chemicals. Dust. Mold or mildew. What increases the risk? You may be more likely to develop this condition if you: Have a family history of the disease. Are African American. Are of Northern European descent. Are 19-17 years old. Are female. Work as a Airline pilot. Work in an environment where you are exposed to metals, chemicals, mold or mildew, or insecticides. What are the signs or symptoms? Some people with sarcoidosis have no symptoms. Others have very mild symptoms. The symptoms usually depend on the organ that is affected. Sarcoidosis most often affects the lungs, which may  lead to symptoms such as: Chest pain. Coughing. Wheezing. Shortness of breath. Other common symptoms include: Night sweats. Fever. Weight loss. Tiredness (fatigue). Swollen lymph nodes. Joint pain. How is this diagnosed? This condition may be diagnosed based on: Your symptoms and medical history. A physical exam. Imaging tests such as: Chest X-ray. CT scan. MRI. PET scan. Lung function tests. These tests evaluate your breathing and check for problems that may be related to sarcoidosis. A procedure to remove a tissue sample for testing (biopsy). You may have a biopsy of lung tissue if that is where you are having symptoms. You may have tests to check for any complications of the condition. These tests may include: Eye exams. MRI of the heart or brain. Echocardiogram. ECG (electrocardiogram). How is this treated? In some cases, sarcoidosis does not require a specific treatment because it causes no symptoms or only mild symptoms. If your symptoms bother you or are severe, you may be prescribed medicines to reduce inflammation or relieve symptoms. These medicines may include: Prednisone. This is a steroid that reduces inflammation related to sarcoidosis. Hydroxychloroquine. This may be used to treat sarcoidosis that affects the skin, eyes, or brain. Certain medicines that affect the immune system. These can help with sarcoidosis in the joints, eyes, skin, or lungs. Medicines that you breathe in (inhalers). Inhalers can help you breathe if sarcoidosis affects your lungs. Follow these instructions at home:  Do not use any products that contain nicotine or tobacco. These products include cigarettes, chewing tobacco, and vaping devices, such as e-cigarettes. If you need help quitting, ask your health care provider. Avoid secondhand smoke and irritating dust or chemicals. Stay indoors on days when air quality is poor in your area. Return to  your normal activities as told by your health  care provider. Ask your health care provider what activities are safe for you. Take or use over-the-counter and prescription medicines only as told by your health care provider. Keep all follow-up visits. This is important. Where to find more information National Heart, Lung, and Blood Institute: https://wilson-eaton.com/ Contact a health care provider if: You have vision problems. You have a dry cough that does not go away. You have an irregular heartbeat. You have pain or aches in your joints, hands, or feet. You have an unexplained rash. Get help right away if: You have chest pain. You have trouble breathing. These symptoms may represent a serious problem that is an emergency. Do not wait to see if the symptoms will go away. Get medical help right away. Call your local emergency services (911 in the [place] Do not drive yourself to the hospital. Summary Sarcoidosis is a disease that can cause inflammation in many body areas of the body. It most often affects the lungs (pulmonary sarcoidosis). It can also affect the lymph nodes, liver, eyes, skin, heart, or any other body tissue. When you have sarcoidosis, cells from your immune system form small lumps (granulomas) in the affected area of your body. Sarcoidosis sometimes does not require a specific treatment because it causes no symptoms or only mild symptoms. If your symptoms bother you or are severe, you may be prescribed medicines to reduce inflammation or relieve symptoms. This information is not intended to replace advice given to you by your health care provider. Make sure you discuss any questions you have with your health care provider. Document Revised: 06/13/2020 Document Reviewed: 06/13/2020 Elsevier Patient Education  2022 [employer].

## 2021-05-04 NOTE — Progress Notes (Signed)
Please let patient know that her CXR showed chronic lung changes without acute findings.

## 2021-05-04 NOTE — Progress Notes (Signed)
$'@Patient'P$  ID: Courtney Faulkner, female    DOB: Aug 09, 1951, 70 y.o.   MRN: [id number]  Chief Complaint  Patient presents with   Follow-up    Pt states no concerns    Referring provider: Leeroy Cha  HPI: 70 year old female, former smoker quit in 2003. PMH significant bronchiectasis, chronic respiratory failure, pulmonary sarcoidosis, HTN, internal carotid artery occlusion, anemia. Former patient of Dr. Lake Bells, last seen by pulmonary NP on 04/18/20.  Chest x-ray showed postinflammatory fibrosis changes consistent with her sarcoid on x-ray in 2020.  Patient needs repeat chest x-ray and pulmonary function testing  05/04/2021- Interim hx  Patient presents today for annual follow-up. She is doing well, she has no acute respiratory complaints today. She experience mild dyspnea symptoms with inclines/stairs. She sees an ophthalmologist annually. Denies shortness of breath, chest discomfort or cough.   Testing: CTA chest 06/16/2012>> RUL fibrosis and bilateral LL fibrosis with traction bronchiectasis PFTs 06/16/2012>> FEV1 39%, FVC 35%, TLC 41%, DLCO 24% PFTs 02/16/13:  FeV1 50% TLC 43%  DLCO 46%  Improved from 05/2012 08/28/2015 CT chest > bleitz, no progression in fibrosis, not favored to represent UIP 10/2015 ONO RA > never less than 88% August 2017 pulmonary function test ratio normal, FVC 1.51 L 59% predicted, total lung capacity 2.63 L 52% predicted, DLCO 8.48 34% predicted  No Known Allergies  Immunization History  Administered Date(s) Administered   Influenza Split 06/30/2012, 03/26/2017   Influenza, High Dose Seasonal PF 06/09/2013, 05/26/2018   Influenza,inj,Quad PF,6+ Mos 05/12/2014, 06/26/2015, 04/23/2016   PFIZER(Purple Top)SARS-COV-2 Vaccination 10/10/2019, 11/02/2019   Pneumococcal Conjugate-13 04/22/2014   Pneumococcal Polysaccharide-23 06/30/2012    Past Medical History:  Diagnosis Date   Atrial myxoma 12/17/2011   CVA (cerebral infarction) 12/15/2011   ICAO  (internal carotid artery occlusion), left 12/15/2011   Postinflammatory pulmonary fibrosis (HCC)    Shortness of breath dyspnea    only with active exertional activity   Stroke ([place])     Tobacco History: Social History   Tobacco Use  Smoking Status Former   Packs/day: 0.50   Years: 20.00   Pack years: 10.00   Types: Cigarettes   Quit date: 08/26/2001   Years since quitting: 19.7  Smokeless Tobacco Never   Counseling given: Not Answered   Outpatient Medications Prior to Visit  Medication Sig Dispense Refill   aspirin 81 MG tablet Take 81 mg by mouth daily.     atorvastatin (LIPITOR) 10 MG tablet Take 10 mg by mouth every other day.     metoprolol succinate (TOPROL-XL) 25 MG 24 hr tablet Take 25 mg by mouth daily.     Multiple Vitamin (MULTIVITAMIN) tablet Take 1 tablet by mouth daily.     No facility-administered medications prior to visit.      Review of Systems  Review of Systems  Constitutional: Negative.   HENT: Negative.    Respiratory: Negative.    Cardiovascular: Negative.   Skin: Negative.     Physical Exam  BP 116/84 (BP Location: Right Arm, Patient Position: Sitting, Cuff Size: Normal)   Pulse 67   Temp 98.1 F (36.7 C) (Oral)   Ht '5\' 4"'$  (1.626 m)   Wt 135 lb 12.8 oz (61.6 kg)   SpO2 100%   BMI 23.31 kg/m  Physical Exam Constitutional:      Appearance: Normal appearance.  HENT:     Head: Normocephalic and atraumatic.     Right Ear: There is impacted cerumen.     Left Ear:  There is impacted cerumen.     Mouth/Throat:     Mouth: Mucous membranes are moist.     Pharynx: Oropharynx is clear.  Cardiovascular:     Rate and Rhythm: Normal rate and regular rhythm.     Comments: RRR Pulmonary:     Effort: Pulmonary effort is normal.     Breath sounds: Normal breath sounds.  Musculoskeletal:        General: Normal range of motion.  Skin:    General: Skin is warm and dry.  Neurological:     General: No focal deficit present.     Mental Status:  She is alert and oriented to person, place, and time. Mental status is at baseline.  Psychiatric:        Mood and Affect: Mood normal.        Behavior: Behavior normal.        Thought Content: Thought content normal.        Judgment: Judgment normal.     Lab Results:  CBC    Component Value Date/Time   WBC 6.1 07/25/2014 1056   RBC 3.55 (L) 07/25/2014 1056   HGB 11.5 (L) 07/25/2014 1056   HCT 34.6 (L) 07/25/2014 1056   PLT 295.0 07/25/2014 1056   MCV 97.5 07/25/2014 1056   MCH 29.0 01/17/2012 1105   MCHC 33.1 07/25/2014 1056   RDW 12.9 07/25/2014 1056   LYMPHSABS 2.7 01/17/2012 1105   MONOABS 0.8 01/17/2012 1105   EOSABS 0.3 01/17/2012 1105   BASOSABS 0.1 01/17/2012 1105    BMET    Component Value Date/Time   NA 138 06/16/2012 1054   K 3.4 (L) 06/16/2012 1054   CL 102 06/16/2012 1054   CO2 30 06/16/2012 1054   GLUCOSE 82 06/16/2012 1054   BUN 9 06/16/2012 1054   CREATININE 0.7 06/16/2012 1054   CALCIUM 9.5 06/16/2012 1054   GFRNONAA >90 01/17/2012 1105   GFRAA >90 01/17/2012 1105    BNP No results found for: BNP  ProBNP    Component Value Date/Time   PROBNP 1,066.0 (H) 01/02/2012 1035    Imaging: No results found.   Assessment & Plan:   Pulmonary sarcoidosis ([place]) - Clinically stable without any acute respiratory symptoms - Needs routine follow-up CXR and PFTs  - Return in 1 year with Dr. Erin Fulling (new patient) or sooner if needed    Martyn Ehrich, NP 05/04/2021

## 2021-05-29 ENCOUNTER — Ambulatory Visit (INDEPENDENT_AMBULATORY_CARE_PROVIDER_SITE_OTHER): Payer: Medicare PPO | Admitting: Pulmonary Disease

## 2021-05-29 ENCOUNTER — Other Ambulatory Visit: Payer: Self-pay

## 2021-05-29 ENCOUNTER — Ambulatory Visit: Payer: Medicare PPO

## 2021-05-29 DIAGNOSIS — D860 Sarcoidosis of lung: Secondary | ICD-10-CM | POA: Diagnosis not present

## 2021-05-29 LAB — PULMONARY FUNCTION TEST
DL/VA % pred: 85 %
DL/VA: 3.58 ml/min/mmHg/L
DLCO cor % pred: 41 %
DLCO cor: 7.81 ml/min/mmHg
DLCO unc % pred: 41 %
DLCO unc: 7.81 ml/min/mmHg
FEF 25-75 Post: 1.26 L/sec
FEF 25-75 Pre: 1.34 L/sec
FEF2575-%Change-Post: -6 %
FEF2575-%Pred-Post: 75 %
FEF2575-%Pred-Pre: 80 %
FEV1-%Change-Post: 0 %
FEV1-%Pred-Post: 71 %
FEV1-%Pred-Pre: 70 %
FEV1-Post: 1.27 L
FEV1-Pre: 1.26 L
FEV1FVC-%Change-Post: 4 %
FEV1FVC-%Pred-Pre: 106 %
FEV6-%Change-Post: -1 %
FEV6-%Pred-Post: 66 %
FEV6-%Pred-Pre: 67 %
FEV6-Post: 1.47 L
FEV6-Pre: 1.49 L
FEV6FVC-%Pred-Post: 104 %
FEV6FVC-%Pred-Pre: 104 %
FVC-%Change-Post: -3 %
FVC-%Pred-Post: 64 %
FVC-%Pred-Pre: 66 %
FVC-Post: 1.47 L
FVC-Pre: 1.52 L
Post FEV1/FVC ratio: 86 %
Post FEV6/FVC ratio: 100 %
Pre FEV1/FVC ratio: 83 %
Pre FEV6/FVC Ratio: 100 %
RV % pred: 53 %
RV: 1.14 L
TLC % pred: 53 %
TLC: 2.62 L

## 2021-05-29 NOTE — Progress Notes (Signed)
Please let patient know PFTs showed moderate restriction and diffusion defect in keeping with her dx sarcoid. No significant change in her lung function when compared with 2019. Keep annual follow-up. Return soon if she develops respiratory symptoms. Needs annual eye exam.

## 2021-05-29 NOTE — Progress Notes (Signed)
PFT done today. 

## 2021-05-30 ENCOUNTER — Telehealth: Payer: Self-pay | Admitting: Pulmonary Disease

## 2021-05-30 NOTE — Telephone Encounter (Signed)
Courtney Ehrich, NP  Courtney Faulkner, Courtney Faulkner, CMA Please let patient know PFTs showed moderate restriction and diffusion defect in keeping with her dx sarcoid. No significant change in her lung function when compared with 2019. Keep annual follow-up. Return soon if she develops respiratory symptoms. Needs annual eye exam.    Called and spoke with pt letting her know the results of PFT and she verbalized understanding. Nothing further needed.

## 2021-06-06 ENCOUNTER — Ambulatory Visit (HOSPITAL_COMMUNITY): Payer: Medicare PPO | Attending: Cardiology

## 2021-06-06 ENCOUNTER — Other Ambulatory Visit: Payer: Self-pay

## 2021-06-06 DIAGNOSIS — D151 Benign neoplasm of heart: Secondary | ICD-10-CM | POA: Insufficient documentation

## 2021-06-06 LAB — ECHOCARDIOGRAM COMPLETE
Area-P 1/2: 3.66 cm2
S' Lateral: 2.8 cm

## 2021-06-15 ENCOUNTER — Other Ambulatory Visit: Payer: Self-pay | Admitting: Internal Medicine

## 2021-06-15 DIAGNOSIS — Z1231 Encounter for screening mammogram for malignant neoplasm of breast: Secondary | ICD-10-CM

## 2021-07-18 ENCOUNTER — Ambulatory Visit
Admission: RE | Admit: 2021-07-18 | Discharge: 2021-07-18 | Disposition: A | Discharge status: Home / Self Care | Payer: Medicare PPO | Source: Ambulatory Visit | Attending: Internal Medicine | Admitting: Internal Medicine

## 2021-07-18 DIAGNOSIS — Z1231 Encounter for screening mammogram for malignant neoplasm of breast: Secondary | ICD-10-CM

## 2021-08-30 DIAGNOSIS — J84112 Idiopathic pulmonary fibrosis: Secondary | ICD-10-CM | POA: Diagnosis not present

## 2021-08-30 DIAGNOSIS — I10 Essential (primary) hypertension: Secondary | ICD-10-CM | POA: Diagnosis not present

## 2021-08-30 DIAGNOSIS — Z0000 Encounter for general adult medical examination without abnormal findings: Secondary | ICD-10-CM | POA: Diagnosis not present

## 2021-08-30 DIAGNOSIS — M85859 Other specified disorders of bone density and structure, unspecified thigh: Secondary | ICD-10-CM | POA: Diagnosis not present

## 2021-08-30 DIAGNOSIS — Z8673 Personal history of transient ischemic attack (TIA), and cerebral infarction without residual deficits: Secondary | ICD-10-CM | POA: Diagnosis not present

## 2021-08-30 DIAGNOSIS — Z86010 Personal history of colonic polyps: Secondary | ICD-10-CM | POA: Diagnosis not present

## 2021-08-30 DIAGNOSIS — E785 Hyperlipidemia, unspecified: Secondary | ICD-10-CM | POA: Diagnosis not present

## 2021-08-30 DIAGNOSIS — D860 Sarcoidosis of lung: Secondary | ICD-10-CM | POA: Diagnosis not present

## 2021-08-30 DIAGNOSIS — Z1389 Encounter for screening for other disorder: Secondary | ICD-10-CM | POA: Diagnosis not present

## 2021-08-30 DIAGNOSIS — I7300 Raynaud's syndrome without gangrene: Secondary | ICD-10-CM | POA: Diagnosis not present

## 2021-09-12 DIAGNOSIS — H2513 Age-related nuclear cataract, bilateral: Secondary | ICD-10-CM | POA: Diagnosis not present

## 2021-09-12 DIAGNOSIS — G902 Horner's syndrome: Secondary | ICD-10-CM | POA: Diagnosis not present

## 2021-09-12 DIAGNOSIS — H52203 Unspecified astigmatism, bilateral: Secondary | ICD-10-CM | POA: Diagnosis not present

## 2021-09-12 DIAGNOSIS — H25011 Cortical age-related cataract, right eye: Secondary | ICD-10-CM | POA: Diagnosis not present

## 2021-10-19 NOTE — Progress Notes (Signed)
X °

## 2022-03-14 DIAGNOSIS — I10 Essential (primary) hypertension: Secondary | ICD-10-CM | POA: Diagnosis not present

## 2022-03-14 DIAGNOSIS — E785 Hyperlipidemia, unspecified: Secondary | ICD-10-CM | POA: Diagnosis not present

## 2022-03-14 DIAGNOSIS — M8588 Other specified disorders of bone density and structure, other site: Secondary | ICD-10-CM | POA: Diagnosis not present

## 2022-03-14 DIAGNOSIS — D860 Sarcoidosis of lung: Secondary | ICD-10-CM | POA: Diagnosis not present

## 2022-03-14 DIAGNOSIS — J84112 Idiopathic pulmonary fibrosis: Secondary | ICD-10-CM | POA: Diagnosis not present

## 2022-03-14 DIAGNOSIS — Z8673 Personal history of transient ischemic attack (TIA), and cerebral infarction without residual deficits: Secondary | ICD-10-CM | POA: Diagnosis not present

## 2022-03-14 DIAGNOSIS — I69359 Hemiplegia and hemiparesis following cerebral infarction affecting unspecified side: Secondary | ICD-10-CM | POA: Diagnosis not present

## 2022-03-15 ENCOUNTER — Telehealth: Payer: Self-pay

## 2022-03-15 ENCOUNTER — Other Ambulatory Visit: Payer: Self-pay | Admitting: Internal Medicine

## 2022-03-15 DIAGNOSIS — M8588 Other specified disorders of bone density and structure, other site: Secondary | ICD-10-CM

## 2022-03-15 NOTE — Telephone Encounter (Signed)
Left message for patient notifying her that her application for handicap placard is ready for pick-up. Placed in P/U area at front desk.

## 2022-04-05 DIAGNOSIS — U071 COVID-19: Secondary | ICD-10-CM | POA: Diagnosis not present

## 2022-04-25 ENCOUNTER — Encounter: Payer: Self-pay | Admitting: Pulmonary Disease

## 2022-04-25 ENCOUNTER — Ambulatory Visit: Payer: Medicare PPO | Admitting: Pulmonary Disease

## 2022-04-25 VITALS — BP 102/60 | HR 74 | Temp 97.8°F | Ht 64.0 in | Wt 136.0 lb

## 2022-04-25 DIAGNOSIS — D860 Sarcoidosis of lung: Secondary | ICD-10-CM

## 2022-04-25 NOTE — Progress Notes (Signed)
Synopsis: Referred in August 2023 for sarcoidosis  Subjective:   PATIENT ID: Courtney Faulkner GENDER: female DOB: 02-May-1951, MRN: [id number]  HPI  Chief Complaint  Patient presents with   Follow-up    No changes in breathing. No complaints of coughing, wheezing, SOB   Courtney Faulkner is a 71 year old woman, former smoker with history of CVA, atrial myxoma and pulmonary sarcoidosis who returns to pulmonary clinic for follow up.   She was last seen by Derl Barrow, NP 04/2021. Repeat PFTs 05/29/21 showed moderate restriction and moderate diffusion defect which are stable from 2019.   Prior to 2017 her pulmonary features were considered to be concerning for UIP and she was on esbriet from 08/09/13 to 01/18/16. Review of her chest imaging in 2017 did not appear consistent with UIP per thoracic radiology team and therefor her diagnosis was felt to be more aligned with sarcoidosis with fibrotic features.   She had covid a few weeks ago and is feeling fine now. No issues in her breathing since last visit with [facility].  She is a retired Pharmacist, hospital. Retired in 2013 after her stroke.   Past Medical History:  Diagnosis Date   Atrial myxoma 12/17/2011   CVA (cerebral infarction) 12/15/2011   ICAO (internal carotid artery occlusion), left 12/15/2011   Postinflammatory pulmonary fibrosis (HCC)    Shortness of breath dyspnea    only with active exertional activity   Stroke [facility])      Family History  Problem Relation Age of Onset   Hypertension Mother    COPD Father    Diabetes Other    Coronary artery disease Neg Hx      Social History   Socioeconomic History   Marital status: Married    Spouse name: micheal   Number of children: 3   Years of education: MA   Highest education level: Not on file  Occupational History   Occupation: Occupational hygienist: [employer]  Tobacco Use   Smoking status: Former    Packs/day: 0.50    Years: 20.00    Total pack years: 10.00     Types: Cigarettes    Quit date: 08/26/2001    Years since quitting: 20.6   Smokeless tobacco: Never  Substance and Sexual Activity   Alcohol use: No   Drug use: No   Sexual activity: Yes    Birth control/protection: Post-menopausal  Other Topics Concern   Not on file  Social History Narrative   Patient is married.   Patient has a Human resources officer   Patient is right handed   Caffeine Use- quit intake 11/2011   Social Determinants of Health   Financial Resource Strain: Not on file  Food Insecurity: Not on file  Transportation Needs: Not on file  Physical Activity: Not on file  Stress: Not on file  Social Connections: Not on file  Intimate Partner Violence: Not on file     No Known Allergies   Outpatient Medications Prior to Visit  Medication Sig Dispense Refill   aspirin 81 MG tablet Take 81 mg by mouth daily.     atorvastatin (LIPITOR) 10 MG tablet Take 10 mg by mouth every other day.     metoprolol succinate (TOPROL-XL) 25 MG 24 hr tablet Take 25 mg by mouth daily.     Multiple Vitamin (MULTIVITAMIN) tablet Take 1 tablet by mouth daily.     No facility-administered medications prior to visit.    Review of Systems  Constitutional:  Negative for chills, fever, malaise/fatigue and weight loss.  HENT:  Negative for congestion, sinus pain and sore throat.   Eyes: Negative.   Respiratory:  Negative for cough, hemoptysis, sputum production, shortness of breath and wheezing.   Cardiovascular:  Negative for chest pain, palpitations, orthopnea, claudication and leg swelling.  Gastrointestinal:  Negative for abdominal pain, heartburn, nausea and vomiting.  Genitourinary: Negative.   Musculoskeletal:  Negative for joint pain and myalgias.  Skin:  Negative for rash.  Neurological:  Negative for weakness.  Endo/Heme/Allergies: Negative.   Psychiatric/Behavioral: Negative.     Objective:   Vitals:   04/25/22 1021  BP: 102/60  Pulse: 74  Temp: 97.8 F (36.6 C)  TempSrc:  Oral  SpO2: 97%  Weight: 136 lb (61.7 kg)  Height: '5\' 4"'$  (1.626 m)   Physical Exam Constitutional:      General: She is not in acute distress.    Appearance: She is not ill-appearing.  HENT:     Head: Normocephalic and atraumatic.  Eyes:     General: No scleral icterus.    Conjunctiva/sclera: Conjunctivae normal.     Pupils: Pupils are equal, round, and reactive to light.  Cardiovascular:     Rate and Rhythm: Normal rate and regular rhythm.     Pulses: Normal pulses.     Heart sounds: Normal heart sounds. No murmur heard. Pulmonary:     Effort: Pulmonary effort is normal.     Breath sounds: Normal breath sounds. No wheezing, rhonchi or rales.  Abdominal:     General: Bowel sounds are normal.     Palpations: Abdomen is soft.  Musculoskeletal:     Right lower leg: No edema.     Left lower leg: No edema.  Lymphadenopathy:     Cervical: No cervical adenopathy.  Skin:    General: Skin is warm and dry.  Neurological:     General: No focal deficit present.     Mental Status: She is alert.  Psychiatric:        Mood and Affect: Mood normal.        Behavior: Behavior normal.        Thought Content: Thought content normal.        Judgment: Judgment normal.    CBC    Component Value Date/Time   WBC 6.1 07/25/2014 1056   RBC 3.55 (L) 07/25/2014 1056   HGB 11.5 (L) 07/25/2014 1056   HCT 34.6 (L) 07/25/2014 1056   PLT 295.0 07/25/2014 1056   MCV 97.5 07/25/2014 1056   MCH 29.0 01/17/2012 1105   MCHC 33.1 07/25/2014 1056   RDW 12.9 07/25/2014 1056   LYMPHSABS 2.7 01/17/2012 1105   MONOABS 0.8 01/17/2012 1105   EOSABS 0.3 01/17/2012 1105   BASOSABS 0.1 01/17/2012 1105      Latest Ref Rng & Units 06/16/2012   10:54 AM 01/17/2012   11:05 AM 01/07/2012    1:10 PM  BMP  Glucose 70 - 99 mg/dL 82  90  116   BUN 6 - 23 mg/dL '9  6  10   '$ Creatinine 0.4 - 1.2 mg/dL 0.7  0.56  0.65   Sodium 135 - 145 mEq/L 138  138  136   Potassium 3.5 - 5.1 mEq/L 3.4  3.0  3.7   Chloride 96 -  112 mEq/L 102  101  100   CO2 19 - 32 mEq/L '30  27  27   '$ Calcium 8.4 - 10.5 mg/dL 9.5  9.6  9.2    Chest imaging: HRCT Chest 08/22/2015 Mediastinum/Lymph Nodes: Mediastinal lymph nodes measure up to 10 mm in the lower right paratracheal station, stable. Hilar regions are difficult to definitively evaluate without IV contrast. No axillary adenopathy. Heart is mildly enlarged. No pericardial effusion.   Lungs/Pleura: Subpleural and mildly basilar predominant subpleural reticulation, traction bronchiectasis/ bronchiolectasis, ground-glass and architectural distortion. There are areas of central parenchymal involvement. Overall distribution is unchanged from 06/16/2012. No definite air trapping. No pleural fluid. There are calcified pleural plaques in the lower right hemi thorax. Airway is unremarkable.  PFT:    Latest Ref Rng & Units 05/29/2021    9:59 AM 10/23/2017    8:57 AM 03/26/2016    1:59 PM 10/24/2015   12:35 PM 10/20/2015    2:56 PM 02/16/2013    3:00 PM  PFT Results  FVC-Pre L 1.52  1.59  1.51  1.52  1.47    FVC-Predicted Pre % 66  66  59  59  57  47   FVC-Post L 1.47  1.55  1.51  1.50  1.44  1.25   FVC-Predicted Post % 64  64  59  59  56  47   Pre FEV1/FVC % % 83  80  76  76  85  84   Post FEV1/FCV % % 86  82  79  80  86  78   FEV1-Pre L 1.26  1.27  1.14  1.16  1.25  1.04   FEV1-Predicted Pre % 70  68  57  58  62  50   FEV1-Post L 1.27  1.27  1.20  1.20  1.23  0.97   DLCO uncorrected ml/min/mmHg 7.81  8.39  8.48  8.86  7.82  11.21   DLCO UNC% % 41  36  34  36  32  46   DLCO corrected ml/min/mmHg 7.81   9.17  9.10  7.90    DLCO COR %Predicted % 41   37  37  32    DLVA Predicted % 85  81  87  78  68  75   TLC L 2.62  2.69  2.63  2.52  2.82  2.22   TLC % Predicted % 53  54  52  49  55  43   RV % Predicted % 53  57  60  51  68  49     Labs:  Path:  Echo 06/06/21: LV EF 55-60%. RV systolic function is normal along with size. LA moderately dilated.   Heart  Catheterization:  Assessment & Plan:   Pulmonary sarcoidosis ([place]) - Plan: Pulmonary Function Test  Discussion: Shandelle Hubble is a 71 year old woman, former smoker with history of CVA, atrial myxoma and pulmonary sarcoidosis who returns to pulmonary clinic for follow up.   Her respiratory status remains stable. We will check pulmonary function tests in October this year. If there is evidence of decline we will repeat a HRCT chest for further evaluation.   Follow up in 1 year.  Courtney Jackson, MD [facility] Office: [phone]   Current Outpatient Medications:    aspirin 81 MG tablet, Take 81 mg by mouth daily., Disp: , Rfl:    atorvastatin (LIPITOR) 10 MG tablet, Take 10 mg by mouth every other day., Disp: , Rfl:    metoprolol succinate (TOPROL-XL) 25 MG 24 hr tablet, Take 25 mg by mouth daily., Disp: , Rfl:    Multiple Vitamin (MULTIVITAMIN) tablet,  Take 1 tablet by mouth daily., Disp: , Rfl:

## 2022-04-25 NOTE — Patient Instructions (Signed)
Your sarcoidosis appears stable at this time.  We will repeat pulmonary function tests in October. We will message you with the results.  Follow up in 1 year.

## 2022-05-02 DIAGNOSIS — L219 Seborrheic dermatitis, unspecified: Secondary | ICD-10-CM | POA: Diagnosis not present

## 2022-06-02 NOTE — Progress Notes (Unsigned)
Cardiology Office Note:    Date:  06/03/2022   ID:  Courtney Faulkner, DOB 20-Jun-1951, MRN [id number]  PCP:  Leeroy Cha, MD  Cardiologist:  Sinclair Grooms, MD   Referring MD: Leeroy Cha,*   Chief Complaint  Patient presents with   Follow-up    Left atrial myxoma follow-up    History of Present Illness:    Courtney Faulkner is a 71 y.o. female with a hx of left atrial myxoma resection 3154, embolic CVA with neurologic residua, and  h/o Sarcoidosis.    No cardiac complaints.  Walks twice a day.  Does tai chi twice per week.  No cardiac symptomatology.  Does have shortness of breath associated with postinflammatory pulmonary fibrosis.  He follows with pulmonary.  Past Medical History:  Diagnosis Date   Atrial myxoma 12/17/2011   CVA (cerebral infarction) 12/15/2011   ICAO (internal carotid artery occlusion), left 12/15/2011   Postinflammatory pulmonary fibrosis (HCC)    Shortness of breath dyspnea    only with active exertional activity   Stroke [facility])     Past Surgical History:  Procedure Laterality Date   ABDOMINAL ANGIOGRAM  12/18/2011   Procedure: ABDOMINAL ANGIOGRAM;  Surgeon: Sinclair Grooms, MD;  Location: [facility] CATH LAB;  Service: Cardiovascular;;   COLONOSCOPY WITH PROPOFOL N/A 02/13/2015   Procedure: COLONOSCOPY WITH PROPOFOL;  Surgeon: Garlan Fair, MD;  Location: [facility] ENDOSCOPY;  Service: Endoscopy;  Laterality: N/A;   Minimally-Invasive Excision of Left Atrial Myxoma  12/24/2011   Dr Roxy Manns    Current Medications: Current Meds  Medication Sig   aspirin 81 MG tablet Take 81 mg by mouth daily.   atorvastatin (LIPITOR) 10 MG tablet Take 10 mg by mouth every other day.   metoprolol succinate (TOPROL-XL) 25 MG 24 hr tablet Take 25 mg by mouth daily.   Multiple Vitamin (MULTIVITAMIN) tablet Take 1 tablet by mouth daily.     Allergies:   Patient has no known allergies.   Social History   Socioeconomic History   Marital status: Married     Spouse name: micheal   Number of children: 3   Years of education: MA   Highest education level: Not on file  Occupational History   Occupation: Occupational hygienist: [employer]  Tobacco Use   Smoking status: Former    Packs/day: 0.50    Years: 20.00    Total pack years: 10.00    Types: Cigarettes    Quit date: 08/26/2001    Years since quitting: 20.7   Smokeless tobacco: Never  Substance and Sexual Activity   Alcohol use: No   Drug use: No   Sexual activity: Yes    Birth control/protection: Post-menopausal  Other Topics Concern   Not on file  Social History Narrative   Patient is married.   Patient has a Human resources officer   Patient is right handed   Caffeine Use- quit intake 11/2011   Social Determinants of Health   Financial Resource Strain: Not on file  Food Insecurity: Not on file  Transportation Needs: Not on file  Physical Activity: Not on file  Stress: Not on file  Social Connections: Not on file     Family History: The patient's family history includes COPD in her father; Diabetes in an other family member; Hypertension in her mother. There is no history of Coronary artery disease.  ROS:   Please see the history of present illness.    Weight  is stable.  Asked about which vaccination she should get (I suggested RSV, COVID, and influenza) all other systems reviewed and are negative.  EKGs/Labs/Other Studies Reviewed:    The following studies were reviewed today: 2 D Doppler ECHO 05/2021: IMPRESSIONS     1. Left ventricular ejection fraction, by estimation, is 55 to 60%. The  left ventricle has normal function. The left ventricle has no regional  wall motion abnormalities. Left ventricular diastolic parameters were  normal.   2. Right ventricular systolic function is normal. The right ventricular  size is normal.   3. Left atrial size was moderately dilated.   4. The mitral valve is normal in structure. No evidence of mitral valve   regurgitation. No evidence of mitral stenosis.   5. The aortic valve is normal in structure. Aortic valve regurgitation is  trivial.   6. The inferior vena cava is normal in size with greater than 50%  respiratory variability, suggesting right atrial pressure of 3 mmHg.   EKG:  EKG sinus rhythm, left atrial abnormality, R prime in V1.  Left axis deviation.  Borderline short PR interval.  Change when compared to February 28, 2021.  Recent Labs: No results found for requested labs within last 365 days.  Recent Lipid Panel    Component Value Date/Time   CHOL 134 12/16/2011 0311   TRIG 90 12/16/2011 0311   HDL 45 12/16/2011 0311   CHOLHDL 3.0 12/16/2011 0311   VLDL 18 12/16/2011 0311   LDLCALC 71 12/16/2011 0311    Physical Exam:    VS:  BP 124/78   Pulse 75   Ht 5' 4"  (1.626 m)   Wt 137 lb 9.6 oz (62.4 kg)   BMI 23.62 kg/m     Wt Readings from Last 3 Encounters:  06/03/22 137 lb 9.6 oz (62.4 kg)  04/25/22 136 lb (61.7 kg)  05/04/21 135 lb 12.8 oz (61.6 kg)     GEN: Healthy appearing. No acute distress HEENT: Normal NECK: No JVD. LYMPHATICS: No lymphadenopathy CARDIAC: No murmur. RRR no gallop, or edema. VASCULAR:  Normal Pulses. No bruits. RESPIRATORY: Rales at right base.  Otherwise clear to auscultation without  wheezing or rhonchi  ABDOMEN: Soft, non-tender, non-distended, No pulsatile mass, MUSCULOSKELETAL: No deformity  SKIN: Warm and dry NEUROLOGIC:  Alert and oriented x 3 PSYCHIATRIC:  Normal affect   ASSESSMENT:    1. Atrial myxoma   2. Essential hypertension   3. Pulmonary sarcoidosis ([place])   4. Hyperlipidemia LDL goal <70   5. ICAO (internal carotid artery occlusion), left    PLAN:    In order of problems listed above:  No evidence of recurrence now 10 years out.  This can be followed as required.  Risk of recurrence at this late date is low. Well-controlled.  Target 130/80 mmHg. Postinflammatory pulmonary fibrosis being followed by pulmonary and  stable. Continue low intensity statin therapy. Left carotid artery occlusion felt to be secondary to an embolic phenomenon at the time of left atrial myxoma identification.   Medication Adjustments/Labs and Tests Ordered: Current medicines are reviewed at length with the patient today.  Concerns regarding medicines are outlined above.  Orders Placed This Encounter  Procedures   EKG 12-Lead   No orders of the defined types were placed in this encounter.   Patient Instructions  Medication Instructions:  Your physician recommends that you continue on your current medications as directed. Please refer to the Current Medication list given to you today.  *If you  need a refill on your cardiac medications before your next appointment, please call your pharmacy*  Lab Work: NONE  Testing/Procedures: NONE  Follow-Up: As needed  Important Information About Sugar         Signed, Sinclair Grooms, MD  06/03/2022 8:32 AM    [place]

## 2022-06-03 ENCOUNTER — Encounter: Payer: Self-pay | Admitting: Interventional Cardiology

## 2022-06-03 ENCOUNTER — Ambulatory Visit: Payer: Medicare PPO | Attending: Interventional Cardiology | Admitting: Interventional Cardiology

## 2022-06-03 VITALS — BP 124/78 | HR 75 | Ht 64.0 in | Wt 137.6 lb

## 2022-06-03 DIAGNOSIS — I6522 Occlusion and stenosis of left carotid artery: Secondary | ICD-10-CM

## 2022-06-03 DIAGNOSIS — I10 Essential (primary) hypertension: Secondary | ICD-10-CM

## 2022-06-03 DIAGNOSIS — D151 Benign neoplasm of heart: Secondary | ICD-10-CM | POA: Diagnosis not present

## 2022-06-03 DIAGNOSIS — E785 Hyperlipidemia, unspecified: Secondary | ICD-10-CM | POA: Diagnosis not present

## 2022-06-03 DIAGNOSIS — D860 Sarcoidosis of lung: Secondary | ICD-10-CM

## 2022-06-03 NOTE — Patient Instructions (Signed)
Medication Instructions:  Your physician recommends that you continue on your current medications as directed. Please refer to the Current Medication list given to you today.  *If you need a refill on your cardiac medications before your next appointment, please call your pharmacy*  Lab Work: NONE  Testing/Procedures: NONE  Follow-Up: As needed  Important Information About Sugar       

## 2022-06-10 ENCOUNTER — Ambulatory Visit (INDEPENDENT_AMBULATORY_CARE_PROVIDER_SITE_OTHER): Payer: Medicare PPO | Admitting: Pulmonary Disease

## 2022-06-10 DIAGNOSIS — D860 Sarcoidosis of lung: Secondary | ICD-10-CM

## 2022-06-10 LAB — PULMONARY FUNCTION TEST
DL/VA % pred: 90 %
DL/VA: 3.79 ml/min/mmHg/L
DLCO cor % pred: 42 %
DLCO cor: 8.07 ml/min/mmHg
DLCO unc % pred: 42 %
DLCO unc: 8.07 ml/min/mmHg
FEF 25-75 Post: 0.89 L/sec
FEF 25-75 Pre: 1.07 L/sec
FEF2575-%Change-Post: -16 %
FEF2575-%Pred-Post: 48 %
FEF2575-%Pred-Pre: 57 %
FEV1-%Change-Post: -5 %
FEV1-%Pred-Post: 51 %
FEV1-%Pred-Pre: 54 %
FEV1-Post: 1.11 L
FEV1-Pre: 1.18 L
FEV1FVC-%Change-Post: -5 %
FEV1FVC-%Pred-Pre: 106 %
FEV6-%Change-Post: 0 %
FEV6-%Pred-Post: 52 %
FEV6-%Pred-Pre: 52 %
FEV6-Post: 1.44 L
FEV6-Pre: 1.45 L
FEV6FVC-%Pred-Post: 104 %
FEV6FVC-%Pred-Pre: 104 %
FVC-%Change-Post: 0 %
FVC-%Pred-Post: 50 %
FVC-%Pred-Pre: 50 %
FVC-Post: 1.46 L
FVC-Pre: 1.46 L
Post FEV1/FVC ratio: 76 %
Post FEV6/FVC ratio: 100 %
Pre FEV1/FVC ratio: 81 %
Pre FEV6/FVC Ratio: 100 %
RV % pred: 69 %
RV: 1.49 L
TLC % pred: 56 %
TLC: 2.78 L

## 2022-06-10 NOTE — Progress Notes (Signed)
Full PFT performed today. °

## 2022-06-10 NOTE — Patient Instructions (Signed)
Full PFT performed today. °

## 2022-09-06 ENCOUNTER — Other Ambulatory Visit: Payer: Self-pay | Admitting: Internal Medicine

## 2022-09-06 DIAGNOSIS — M85859 Other specified disorders of bone density and structure, unspecified thigh: Secondary | ICD-10-CM

## 2022-09-11 ENCOUNTER — Other Ambulatory Visit: Payer: Medicare PPO

## 2022-09-23 DIAGNOSIS — H40053 Ocular hypertension, bilateral: Secondary | ICD-10-CM | POA: Diagnosis not present

## 2022-09-23 DIAGNOSIS — H524 Presbyopia: Secondary | ICD-10-CM | POA: Diagnosis not present

## 2022-10-03 DIAGNOSIS — E785 Hyperlipidemia, unspecified: Secondary | ICD-10-CM | POA: Diagnosis not present

## 2022-10-03 DIAGNOSIS — Z0000 Encounter for general adult medical examination without abnormal findings: Secondary | ICD-10-CM | POA: Diagnosis not present

## 2022-10-03 DIAGNOSIS — I69359 Hemiplegia and hemiparesis following cerebral infarction affecting unspecified side: Secondary | ICD-10-CM | POA: Diagnosis not present

## 2022-10-03 DIAGNOSIS — I10 Essential (primary) hypertension: Secondary | ICD-10-CM | POA: Diagnosis not present

## 2022-10-03 DIAGNOSIS — I5030 Unspecified diastolic (congestive) heart failure: Secondary | ICD-10-CM | POA: Diagnosis not present

## 2022-10-03 DIAGNOSIS — Z1211 Encounter for screening for malignant neoplasm of colon: Secondary | ICD-10-CM | POA: Diagnosis not present

## 2022-10-03 DIAGNOSIS — I69351 Hemiplegia and hemiparesis following cerebral infarction affecting right dominant side: Secondary | ICD-10-CM | POA: Diagnosis not present

## 2022-10-03 DIAGNOSIS — Z1231 Encounter for screening mammogram for malignant neoplasm of breast: Secondary | ICD-10-CM | POA: Diagnosis not present

## 2022-10-03 DIAGNOSIS — J84112 Idiopathic pulmonary fibrosis: Secondary | ICD-10-CM | POA: Diagnosis not present

## 2023-01-02 ENCOUNTER — Other Ambulatory Visit: Payer: Self-pay | Admitting: Internal Medicine

## 2023-01-02 DIAGNOSIS — Z1231 Encounter for screening mammogram for malignant neoplasm of breast: Secondary | ICD-10-CM

## 2023-02-17 ENCOUNTER — Ambulatory Visit
Admission: RE | Admit: 2023-02-17 | Discharge: 2023-02-17 | Disposition: A | Discharge status: Home / Self Care | Payer: Medicare PPO | Source: Ambulatory Visit | Attending: Internal Medicine | Admitting: Internal Medicine

## 2023-02-17 DIAGNOSIS — Z1231 Encounter for screening mammogram for malignant neoplasm of breast: Secondary | ICD-10-CM

## 2023-02-17 DIAGNOSIS — M85859 Other specified disorders of bone density and structure, unspecified thigh: Secondary | ICD-10-CM

## 2023-02-17 DIAGNOSIS — N958 Other specified menopausal and perimenopausal disorders: Secondary | ICD-10-CM | POA: Diagnosis not present

## 2023-02-17 DIAGNOSIS — E349 Endocrine disorder, unspecified: Secondary | ICD-10-CM | POA: Diagnosis not present

## 2023-04-17 DIAGNOSIS — I110 Hypertensive heart disease with heart failure: Secondary | ICD-10-CM | POA: Diagnosis not present

## 2023-04-17 DIAGNOSIS — J8410 Pulmonary fibrosis, unspecified: Secondary | ICD-10-CM | POA: Diagnosis not present

## 2023-04-17 DIAGNOSIS — Z86010 Personal history of colonic polyps: Secondary | ICD-10-CM | POA: Diagnosis not present

## 2023-04-17 DIAGNOSIS — I5030 Unspecified diastolic (congestive) heart failure: Secondary | ICD-10-CM | POA: Diagnosis not present

## 2023-04-17 DIAGNOSIS — I10 Essential (primary) hypertension: Secondary | ICD-10-CM | POA: Diagnosis not present

## 2023-04-17 DIAGNOSIS — E785 Hyperlipidemia, unspecified: Secondary | ICD-10-CM | POA: Diagnosis not present

## 2023-04-17 DIAGNOSIS — M85859 Other specified disorders of bone density and structure, unspecified thigh: Secondary | ICD-10-CM | POA: Diagnosis not present

## 2023-05-02 ENCOUNTER — Ambulatory Visit: Payer: Medicare PPO | Admitting: Pulmonary Disease

## 2023-05-02 ENCOUNTER — Encounter: Payer: Self-pay | Admitting: Pulmonary Disease

## 2023-05-02 VITALS — BP 118/76 | HR 57 | Temp 97.6°F | Ht 63.0 in | Wt 138.0 lb

## 2023-05-02 DIAGNOSIS — D860 Sarcoidosis of lung: Secondary | ICD-10-CM | POA: Diagnosis not present

## 2023-05-02 NOTE — Patient Instructions (Signed)
You breathing tests from last fall are stable  I am glad your breathing has been doing well  Follow up in 1 year, call sooner if needed

## 2023-05-02 NOTE — Progress Notes (Signed)
Synopsis: Referred in August 2023 for sarcoidosis  Subjective:   PATIENT ID: Courtney Faulkner GENDER: female DOB: [birth year], MRN: [id number]  HPI  Chief Complaint  Patient presents with   Follow-up    Breathing no change    Courtney Faulkner is a 72 year old woman, former smoker with history of CVA, atrial myxoma and pulmonary sarcoidosis who returns to pulmonary clinic for follow up.   OV 04/25/22 She was last seen by Buelah Manis, NP 04/2021. Repeat PFTs 05/29/21 showed moderate restriction and moderate diffusion defect which are stable from 2019.   Prior to 2017 her pulmonary features were considered to be concerning for UIP and she was on esbriet from 08/09/13 to 01/18/16. Review of her chest imaging in 2017 did not appear consistent with UIP per thoracic radiology team and therefor her diagnosis was felt to be more aligned with sarcoidosis with fibrotic features.   She had covid a few weeks ago and is feeling fine now. No issues in her breathing since last visit with [facility].  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   Today OV 05/02/23 PFTs from 06/10/22 show moderate restriction and moderate diffusion defect which is stable from prior years.   She has been doing well since last visit with no changes in her breathing. Denies cough, exertional dyspnea, wheezing or chest pains.  Past Medical History:  Diagnosis Date   Atrial myxoma 12/17/2011   CVA (cerebral infarction) 12/15/2011   ICAO (internal carotid artery occlusion), left 12/15/2011   Postinflammatory pulmonary fibrosis (HCC)    Shortness of breath dyspnea    only with active exertional activity   Stroke [facility])      Family History  Problem Relation Age of Onset   Hypertension Mother    COPD Father    Diabetes Other    Coronary artery disease Neg Hx      Social History   Socioeconomic History   Marital status: Married    Spouse name: micheal   Number of children: 3   Years of education: MA    Highest education level: Not on file  Occupational History   Occupation: Contractor: [employer]  Tobacco Use   Smoking status: Former    Current packs/day: 0.00    Average packs/day: 0.5 packs/day for 20.0 years (10.0 ttl pk-yrs)    Types: Cigarettes    Start date: 08/26/1981    Quit date: 08/26/2001    Years since quitting: 21.6   Smokeless tobacco: Never  Substance and Sexual Activity   Alcohol use: No   Drug use: No   Sexual activity: Yes    Birth control/protection: Post-menopausal  Other Topics Concern   Not on file  Social History Narrative   Patient is married.   Patient has a Designer, industrial/product   Patient is right handed   Caffeine Use- quit intake 11/2011   Social Determinants of Health   Financial Resource Strain: Not on file  Food Insecurity: Not on file  Transportation Needs: Not on file  Physical Activity: Not on file  Stress: Not on file  Social Connections: Not on file  Intimate Partner Violence: Not on file     No Known Allergies   Outpatient Medications Prior to Visit  Medication Sig Dispense Refill   aspirin 81 MG tablet Take 81 mg by mouth daily.     atorvastatin (LIPITOR) 10 MG tablet Take 10 mg by mouth every other day.  metoprolol succinate (TOPROL-XL) 25 MG 24 hr tablet Take 25 mg by mouth daily.     Multiple Vitamin (MULTIVITAMIN) tablet Take 1 tablet by mouth daily.     No facility-administered medications prior to visit.    Review of Systems  Constitutional:  Negative for chills, fever, malaise/fatigue and weight loss.  HENT:  Negative for congestion, sinus pain and sore throat.   Eyes: Negative.   Respiratory:  Negative for cough, hemoptysis, sputum production, shortness of breath and wheezing.   Cardiovascular:  Negative for chest pain, palpitations, orthopnea, claudication and leg swelling.  Gastrointestinal:  Negative for abdominal pain, heartburn, nausea and vomiting.  Genitourinary: Negative.    Musculoskeletal:  Negative for joint pain and myalgias.  Skin:  Negative for rash.  Neurological:  Negative for weakness.  Endo/Heme/Allergies: Negative.   Psychiatric/Behavioral: Negative.     Objective:   Vitals:   05/02/23 1448  BP: 118/76  Pulse: (!) 57  Temp: 97.6 F (36.4 C)  TempSrc: Temporal  SpO2: 95%  Weight: 138 lb (62.6 kg)  Height: 5\' 3"  (1.6 m)    Physical Exam Constitutional:      General: She is not in acute distress.    Appearance: She is not ill-appearing.  HENT:     Head: Normocephalic and atraumatic.  Eyes:     General: No scleral icterus.    Conjunctiva/sclera: Conjunctivae normal.  Cardiovascular:     Rate and Rhythm: Normal rate and regular rhythm.     Pulses: Normal pulses.     Heart sounds: Normal heart sounds. No murmur heard. Pulmonary:     Effort: Pulmonary effort is normal.     Breath sounds: Normal breath sounds. No wheezing, rhonchi or rales.  Musculoskeletal:     Right lower leg: No edema.     Left lower leg: No edema.  Skin:    General: Skin is warm and dry.  Neurological:     General: No focal deficit present.     Mental Status: She is alert.    CBC    Component Value Date/Time   WBC 6.1 07/25/2014 1056   RBC 3.55 (L) 07/25/2014 1056   HGB 11.5 (L) 07/25/2014 1056   HCT 34.6 (L) 07/25/2014 1056   PLT 295.0 07/25/2014 1056   MCV 97.5 07/25/2014 1056   MCH 29.0 01/17/2012 1105   MCHC 33.1 07/25/2014 1056   RDW 12.9 07/25/2014 1056   LYMPHSABS 2.7 01/17/2012 1105   MONOABS 0.8 01/17/2012 1105   EOSABS 0.3 01/17/2012 1105   BASOSABS 0.1 01/17/2012 1105      Latest Ref Rng & Units 06/16/2012   10:54 AM 01/17/2012   11:05 AM 01/07/2012    1:10 PM  BMP  Glucose 70 - 99 mg/dL 82  90  761   BUN 6 - 23 mg/dL 9  6  10    Creatinine 0.4 - 1.2 mg/dL 0.7  6.07  3.71   Sodium 135 - 145 mEq/L 138  138  136   Potassium 3.5 - 5.1 mEq/L 3.4  3.0  3.7   Chloride 96 - 112 mEq/L 102  101  100   CO2 19 - 32 mEq/L 30  27  27     Calcium 8.4 - 10.5 mg/dL 9.5  9.6  9.2    Chest imaging: HRCT Chest 08/22/2015 Mediastinum/Lymph Nodes: Mediastinal lymph nodes measure up to 10 mm in the lower right paratracheal station, stable. Hilar regions are difficult to definitively evaluate without IV contrast. No axillary adenopathy. Heart  is mildly enlarged. No pericardial effusion.   Lungs/Pleura: Subpleural and mildly basilar predominant subpleural reticulation, traction bronchiectasis/ bronchiolectasis, ground-glass and architectural distortion. There are areas of central parenchymal involvement. Overall distribution is unchanged from 06/16/2012. No definite air trapping. No pleural fluid. There are calcified pleural plaques in the lower right hemi thorax. Airway is unremarkable.  PFT:    Latest Ref Rng & Units 06/10/2022    9:04 AM 05/29/2021    9:59 AM 10/23/2017    8:57 AM 03/26/2016    1:59 PM 10/24/2015   12:35 PM 10/20/2015    2:56 PM 02/16/2013    3:00 PM  PFT Results  FVC-Pre L 1.46  1.52  1.59  1.51  1.52  1.47    FVC-Predicted Pre % 50  66  66  59  59  57  47   FVC-Post L 1.46  1.47  1.55  1.51  1.50  1.44  1.25   FVC-Predicted Post % 50  64  64  59  59  56  47   Pre FEV1/FVC % % 81  83  80  76  76  85  84   Post FEV1/FCV % % 76  86  82  79  80  86  78   FEV1-Pre L 1.18  1.26  1.27  1.14  1.16  1.25  1.04   FEV1-Predicted Pre % 54  70  68  57  58  62  50   FEV1-Post L 1.11  1.27  1.27  1.20  1.20  1.23  0.97   DLCO uncorrected ml/min/mmHg 8.07  7.81  8.39  8.48  8.86  7.82  11.21   DLCO UNC% % 42  41  36  34  36  32  46   DLCO corrected ml/min/mmHg 8.07  7.81   9.17  9.10  7.90    DLCO COR %Predicted % 42  41   37  37  32    DLVA Predicted % 90  85  81  87  78  68  75   TLC L 2.78  2.62  2.69  2.63  2.52  2.82  2.22   TLC % Predicted % 56  53  54  52  49  55  43   RV % Predicted % 69  53  57  60  51  68  49     Labs:  Path:  Echo 06/06/21: LV EF 55-60%. RV systolic function is normal along with  size. LA moderately dilated.   Heart Catheterization:  Assessment & Plan:   Pulmonary sarcoidosis [facility])  Discussion: Yuneisy Thrall is a 72 year old woman, former smoker with history of CVA, atrial myxoma and pulmonary sarcoidosis who returns to pulmonary clinic for follow up.   Her respiratory status remains stable. Pulmonary function tests show moderate restriction and moderate diffusion defect which has remained stable.  Follow up in 1 year.  Melody Comas, MD [facility] Office: [phone]   Current Outpatient Medications:    aspirin 81 MG tablet, Take 81 mg by mouth daily., Disp: , Rfl:    atorvastatin (LIPITOR) 10 MG tablet, Take 10 mg by mouth every other day., Disp: , Rfl:    metoprolol succinate (TOPROL-XL) 25 MG 24 hr tablet, Take 25 mg by mouth daily., Disp: , Rfl:    Multiple Vitamin (MULTIVITAMIN) tablet, Take 1 tablet by mouth daily., Disp: , Rfl:

## 2023-09-03 DIAGNOSIS — H6122 Impacted cerumen, left ear: Secondary | ICD-10-CM | POA: Diagnosis not present

## 2023-10-08 DIAGNOSIS — H52203 Unspecified astigmatism, bilateral: Secondary | ICD-10-CM | POA: Diagnosis not present

## 2023-10-08 DIAGNOSIS — H2513 Age-related nuclear cataract, bilateral: Secondary | ICD-10-CM | POA: Diagnosis not present

## 2023-10-08 DIAGNOSIS — H25013 Cortical age-related cataract, bilateral: Secondary | ICD-10-CM | POA: Diagnosis not present

## 2023-10-08 DIAGNOSIS — G902 Horner's syndrome: Secondary | ICD-10-CM | POA: Diagnosis not present

## 2023-10-09 ENCOUNTER — Other Ambulatory Visit (HOSPITAL_COMMUNITY): Payer: Self-pay | Admitting: Internal Medicine

## 2023-10-09 DIAGNOSIS — Z23 Encounter for immunization: Secondary | ICD-10-CM | POA: Diagnosis not present

## 2023-10-09 DIAGNOSIS — D860 Sarcoidosis of lung: Secondary | ICD-10-CM | POA: Diagnosis not present

## 2023-10-09 DIAGNOSIS — I5032 Chronic diastolic (congestive) heart failure: Secondary | ICD-10-CM | POA: Diagnosis not present

## 2023-10-09 DIAGNOSIS — I10 Essential (primary) hypertension: Secondary | ICD-10-CM | POA: Diagnosis not present

## 2023-10-09 DIAGNOSIS — J84112 Idiopathic pulmonary fibrosis: Secondary | ICD-10-CM | POA: Diagnosis not present

## 2023-10-09 DIAGNOSIS — I6522 Occlusion and stenosis of left carotid artery: Secondary | ICD-10-CM

## 2023-10-09 DIAGNOSIS — Z1331 Encounter for screening for depression: Secondary | ICD-10-CM | POA: Diagnosis not present

## 2023-10-09 DIAGNOSIS — Z0000 Encounter for general adult medical examination without abnormal findings: Secondary | ICD-10-CM | POA: Diagnosis not present

## 2023-10-09 DIAGNOSIS — Z136 Encounter for screening for cardiovascular disorders: Secondary | ICD-10-CM | POA: Diagnosis not present

## 2023-10-09 DIAGNOSIS — Z86018 Personal history of other benign neoplasm: Secondary | ICD-10-CM | POA: Diagnosis not present

## 2023-10-09 DIAGNOSIS — Z8673 Personal history of transient ischemic attack (TIA), and cerebral infarction without residual deficits: Secondary | ICD-10-CM | POA: Diagnosis not present

## 2023-10-16 ENCOUNTER — Ambulatory Visit (HOSPITAL_COMMUNITY): Payer: Medicare PPO

## 2023-10-23 ENCOUNTER — Ambulatory Visit (HOSPITAL_COMMUNITY)
Admission: RE | Admit: 2023-10-23 | Discharge: 2023-10-23 | Disposition: A | Discharge status: Home / Self Care | Payer: Medicare PPO | Source: Ambulatory Visit | Attending: Internal Medicine | Admitting: Internal Medicine

## 2023-10-23 DIAGNOSIS — I6522 Occlusion and stenosis of left carotid artery: Secondary | ICD-10-CM | POA: Diagnosis not present

## 2023-10-23 NOTE — Progress Notes (Signed)
 Carotid artery duplex has been completed. Preliminary results can be found in CV Proc through chart review.   10/23/23 9:06 AM Olen Cordial RVT

## 2024-01-14 ENCOUNTER — Ambulatory Visit (INDEPENDENT_AMBULATORY_CARE_PROVIDER_SITE_OTHER): Admitting: Podiatry

## 2024-01-14 DIAGNOSIS — Q828 Other specified congenital malformations of skin: Secondary | ICD-10-CM

## 2024-01-14 NOTE — Progress Notes (Signed)
  Subjective:  Patient ID: Courtney Faulkner, female    DOB: 1951-04-27,  MRN: [id number]  Chief Complaint  Patient presents with   Foot Pain    RM 11 Pt is here for a blister on left hallux and index toe. Pt states blister has been on toes for 1 month and is very painful.    73 y.o. female presents with the above complaint.  Patient presents with left submetatarsal 3 porokeratotic lesion painful to touch is progressive and worse worse with ambulation or shoe pressure patient would like to have it debrided down she has not seen anyone else prior to seeing me for this.  Hurts with ambulation has been going for 1 month.   Review of Systems: Negative except as noted in the HPI. Denies N/V/F/Ch.  Past Medical History:  Diagnosis Date   Atrial myxoma 12/17/2011   CVA (cerebral infarction) 12/15/2011   ICAO (internal carotid artery occlusion), left 12/15/2011   Postinflammatory pulmonary fibrosis (HCC)    Shortness of breath dyspnea    only with active exertional activity   Stroke [facility])     Current Outpatient Medications:    aspirin  81 MG tablet, Take 81 mg by mouth daily., Disp: , Rfl:    atorvastatin (LIPITOR) 10 MG tablet, Take 10 mg by mouth every other day. (Patient not taking: Reported on 01/14/2024), Disp: , Rfl:    metoprolol  succinate (TOPROL -XL) 25 MG 24 hr tablet, Take 25 mg by mouth daily., Disp: , Rfl:    Multiple Vitamin (MULTIVITAMIN) tablet, Take 1 tablet by mouth daily., Disp: , Rfl:   Social History   Tobacco Use  Smoking Status Former   Current packs/day: 0.00   Average packs/day: 0.5 packs/day for 20.0 years (10.0 ttl pk-yrs)   Types: Cigarettes   Start date: 08/26/1981   Quit date: 08/26/2001   Years since quitting: 22.4  Smokeless Tobacco Never    No Known Allergies Objective:  There were no vitals filed for this visit. There is no height or weight on file to calculate BMI. Constitutional Well developed. Well nourished.  Vascular Dorsalis pedis pulses palpable  bilaterally. Posterior tibial pulses palpable bilaterally. Capillary refill normal to all digits.  No cyanosis or clubbing noted. Pedal hair growth normal.  Neurologic Normal speech. Oriented to person, place, and time. Epicritic sensation to light touch grossly present bilaterally.  Dermatologic Left submetatarsal 3 porokeratotic lesion with central nucleated core noted.  Pain on palpation to the lesion.  No other abnormalities identified.  No other open wounds or lesion noted  Orthopedic: Normal joint ROM without pain or crepitus bilaterally. No visible deformities. No bony tenderness.   Radiographs: None Assessment:   1. Porokeratosis    Plan:  Patient was evaluated and treated and all questions answered.  Left submetatarsal 3 porokeratosis - All questions and concerns were discussed with the patient in extensive detail given the amount of pain that she is experiencing she would benefit from aggressive debridement of the lesion using chisel blade to handle the lesion was debrided down to healthy striated tissue.  No complication noted no pinpoint bleeding noted.  No follow-ups on file.

## 2024-01-27 DIAGNOSIS — L810 Postinflammatory hyperpigmentation: Secondary | ICD-10-CM | POA: Diagnosis not present

## 2024-01-27 DIAGNOSIS — L219 Seborrheic dermatitis, unspecified: Secondary | ICD-10-CM | POA: Diagnosis not present

## 2024-04-08 DIAGNOSIS — D860 Sarcoidosis of lung: Secondary | ICD-10-CM | POA: Diagnosis not present

## 2024-04-08 DIAGNOSIS — I6522 Occlusion and stenosis of left carotid artery: Secondary | ICD-10-CM | POA: Diagnosis not present

## 2024-04-08 DIAGNOSIS — I739 Peripheral vascular disease, unspecified: Secondary | ICD-10-CM | POA: Diagnosis not present

## 2024-04-08 DIAGNOSIS — I10 Essential (primary) hypertension: Secondary | ICD-10-CM | POA: Diagnosis not present

## 2024-04-13 ENCOUNTER — Other Ambulatory Visit: Payer: Self-pay | Admitting: Internal Medicine

## 2024-04-13 DIAGNOSIS — Z1231 Encounter for screening mammogram for malignant neoplasm of breast: Secondary | ICD-10-CM

## 2024-04-15 ENCOUNTER — Other Ambulatory Visit: Payer: Self-pay

## 2024-04-15 DIAGNOSIS — I6522 Occlusion and stenosis of left carotid artery: Secondary | ICD-10-CM

## 2024-05-11 ENCOUNTER — Ambulatory Visit
Admission: RE | Admit: 2024-05-11 | Discharge: 2024-05-11 | Disposition: A | Discharge status: Home / Self Care | Source: Ambulatory Visit | Attending: Internal Medicine | Admitting: Internal Medicine

## 2024-05-11 DIAGNOSIS — Z1231 Encounter for screening mammogram for malignant neoplasm of breast: Secondary | ICD-10-CM | POA: Diagnosis not present

## 2024-05-25 ENCOUNTER — Encounter: Payer: Self-pay | Admitting: Vascular Surgery

## 2024-05-25 ENCOUNTER — Ambulatory Visit: Admitting: Vascular Surgery

## 2024-05-25 ENCOUNTER — Ambulatory Visit (HOSPITAL_COMMUNITY)
Admission: RE | Admit: 2024-05-25 | Discharge: 2024-05-25 | Disposition: A | Discharge status: Home / Self Care | Source: Ambulatory Visit | Attending: Vascular Surgery | Admitting: Vascular Surgery

## 2024-05-25 VITALS — BP 118/74 | HR 59 | Temp 97.6°F | Resp 18 | Ht 63.0 in | Wt 132.1 lb

## 2024-05-25 DIAGNOSIS — I6522 Occlusion and stenosis of left carotid artery: Secondary | ICD-10-CM | POA: Insufficient documentation

## 2024-05-25 NOTE — Progress Notes (Signed)
 Patient name: Courtney Faulkner MRN: [id number] DOB: 1951/02/26 Sex: female  REASON FOR CONSULT: Carotid occlusion   HPI: Courtney Faulkner is a 73 y.o. female, that presents for evaluation of carotid occlusion.  Most recently had carotid ultrasound on 10/23/2023 with 1 to 39% right ICA noted stenosis and total left ICA occlusion.  In review the notes she had a CVA in 2013.  At that time she had a stroke secondary to left ICA occlusion with right-sided weakness felt to be related to a myxoma.  She reports no new neurologic events since 2013.  She has worked very aggressively with therapy and now doing tai chi.  Past Medical History:  Diagnosis Date   Atrial myxoma 12/17/2011   CVA (cerebral infarction) 12/15/2011   Hyperlipidemia    Hypertension    ICAO (internal carotid artery occlusion), left 12/15/2011   Postinflammatory pulmonary fibrosis (HCC)    Shortness of breath dyspnea    only with active exertional activity   Stroke [facility])     Past Surgical History:  Procedure Laterality Date   ABDOMINAL ANGIOGRAM  12/18/2011   Procedure: ABDOMINAL ANGIOGRAM;  Surgeon: Victory LELON Claudene DOUGLAS, MD;  Location: [facility] CATH LAB;  Service: Cardiovascular;;   COLONOSCOPY WITH PROPOFOL  N/A 02/13/2015   Procedure: COLONOSCOPY WITH PROPOFOL ;  Surgeon: Gladis MARLA Louder, MD;  Location: [facility] ENDOSCOPY;  Service: Endoscopy;  Laterality: N/A;   Minimally-Invasive Excision of Left Atrial Myxoma  12/24/2011   Dr Dusty    Family History  Problem Relation Age of Onset   Hypertension Mother    COPD Father    Diabetes Other    Coronary artery disease Neg Hx     SOCIAL HISTORY: Social History   Socioeconomic History   Marital status: Married    Spouse name: micheal   Number of children: 3   Years of education: MA   Highest education level: Not on file  Occupational History   Occupation: Contractor: [employer]  Tobacco Use   Smoking status: Former    Current packs/day: 0.00     Average packs/day: 0.5 packs/day for 20.0 years (10.0 ttl pk-yrs)    Types: Cigarettes    Start date: 08/26/1981    Quit date: 08/26/2001    Years since quitting: 22.7   Smokeless tobacco: Never  Vaping Use   Vaping status: Never Used  Substance and Sexual Activity   Alcohol use: No   Drug use: No   Sexual activity: Yes    Birth control/protection: Post-menopausal  Other Topics Concern   Not on file  Social History Narrative   Patient is married.   Patient has a Designer, industrial/product   Patient is right handed   Caffeine Use- quit intake 11/2011   Social Drivers of Corporate investment banker Strain: Not on file  Food Insecurity: Not on file  Transportation Needs: Not on file  Physical Activity: Not on file  Stress: Not on file  Social Connections: Not on file  Intimate Partner Violence: Not on file    No Known Allergies  Current Outpatient Medications  Medication Sig Dispense Refill   aspirin  81 MG tablet Take 81 mg by mouth daily.     atorvastatin (LIPITOR) 10 MG tablet Take 10 mg by mouth every other day.     metoprolol  succinate (TOPROL -XL) 25 MG 24 hr tablet Take 25 mg by mouth daily.     Multiple Vitamin (MULTIVITAMIN) tablet Take 1 tablet by  mouth daily.     No current facility-administered medications for this visit.    REVIEW OF SYSTEMS:  [X]  denotes positive finding, [ ]  denotes negative finding Cardiac  Comments:  Chest pain or chest pressure:    Shortness of breath upon exertion:    Short of breath when lying flat:    Irregular heart rhythm:        Vascular    Pain in calf, thigh, or hip brought on by ambulation:    Pain in feet at night that wakes you up from your sleep:     Blood clot in your veins:    Leg swelling:         Pulmonary    Oxygen  at home:    Productive cough:     Wheezing:         Neurologic    Sudden weakness in arms or legs:     Sudden numbness in arms or legs:     Sudden onset of difficulty speaking or slurred speech:     Temporary loss of vision in one eye:     Problems with dizziness:         Gastrointestinal    Blood in stool:     Vomited blood:         Genitourinary    Burning when urinating:     Blood in urine:        Psychiatric    Major depression:         Hematologic    Bleeding problems:    Problems with blood clotting too easily:        Skin    Rashes or ulcers:        Constitutional    Fever or chills:      PHYSICAL EXAM: Vitals:   05/25/24 1418 05/25/24 1420  BP: 102/76 118/74  Pulse: (!) 59   Resp: 18   Temp: 97.6 F (36.4 C)   TempSrc: Temporal   Weight: 132 lb 1.6 oz (59.9 kg)   Height: 5' 3 (1.6 m)     GENERAL: The patient is a well-nourished female, in no acute distress. The vital signs are documented above. CARDIAC: There is a regular rate and rhythm.  PULMONARY: No respiratory distress. ABDOMEN: Soft and non-tender. MUSCULOSKELETAL: There are no major deformities or cyanosis. NEUROLOGIC: No focal weakness or paresthesias are detected.  Cranial nerves II through XII grossly intact. SKIN: There are no ulcers or rashes noted. PSYCHIATRIC: The patient has a normal affect.  DATA:   Carotid duplex today with 1-39% right ICA stenosis and some possible trickle flow with thumb signal in left ICA  CTA imaging reviewed from 2013 with left ICA occlusion  Duplex earlier this year with left ICA occlusion and multiple imaging reports documenting  Assessment/Plan:  73 y.o. female, that presents for evaluation of carotid occlusion.  Most recently had carotid ultrasound on 10/23/2023 with 1 to 39% right ICA noted stenosis and total left ICA occlusion.  In review the notes she had a CVA in 2013.  At that time she had a stroke secondary to left ICA occlusion with right-sided weakness felt to be related to a myxoma.  Patient has very good understanding that her stroke in 2013 was related to the left ICA occlusion with CT imaging in 2013 demonstrating that her left ICA was  occcluded.  She has had multiple duplex images showing left ICA occlusion over the years.  I discussed there is no role for revascularization  once this is occluded.  There is some question in today's study about potential trickle of flow in the ICA but has a thumb signal and suggests it remains occluded distally.  We could get a CTA neck to further evaluate but I do not think it would change the treatment plan at this point.  She is on aspirin  statin for risk reduction.  The contralateral side has minimal 1 to 39% stenosis.  Discussed for the contralateral asymptomatic side would recommend surgery when stenosis >80%.  Will follow-up in one year with repeat carotid duplex.   Lonni DOROTHA Gaskins, MD [facility] Office: [phone]

## 2024-07-02 DIAGNOSIS — M9901 Segmental and somatic dysfunction of cervical region: Secondary | ICD-10-CM | POA: Diagnosis not present

## 2024-07-02 DIAGNOSIS — M419 Scoliosis, unspecified: Secondary | ICD-10-CM | POA: Diagnosis not present

## 2024-07-02 DIAGNOSIS — M9905 Segmental and somatic dysfunction of pelvic region: Secondary | ICD-10-CM | POA: Diagnosis not present

## 2024-07-02 DIAGNOSIS — M9904 Segmental and somatic dysfunction of sacral region: Secondary | ICD-10-CM | POA: Diagnosis not present

## 2024-07-02 DIAGNOSIS — M9902 Segmental and somatic dysfunction of thoracic region: Secondary | ICD-10-CM | POA: Diagnosis not present

## 2024-07-02 DIAGNOSIS — M9903 Segmental and somatic dysfunction of lumbar region: Secondary | ICD-10-CM | POA: Diagnosis not present

## 2024-07-06 DIAGNOSIS — M9904 Segmental and somatic dysfunction of sacral region: Secondary | ICD-10-CM | POA: Diagnosis not present

## 2024-07-06 DIAGNOSIS — M9901 Segmental and somatic dysfunction of cervical region: Secondary | ICD-10-CM | POA: Diagnosis not present

## 2024-07-06 DIAGNOSIS — M9905 Segmental and somatic dysfunction of pelvic region: Secondary | ICD-10-CM | POA: Diagnosis not present

## 2024-07-06 DIAGNOSIS — M419 Scoliosis, unspecified: Secondary | ICD-10-CM | POA: Diagnosis not present

## 2024-07-06 DIAGNOSIS — M9903 Segmental and somatic dysfunction of lumbar region: Secondary | ICD-10-CM | POA: Diagnosis not present

## 2024-07-06 DIAGNOSIS — M9902 Segmental and somatic dysfunction of thoracic region: Secondary | ICD-10-CM | POA: Diagnosis not present

## 2024-07-08 DIAGNOSIS — M9902 Segmental and somatic dysfunction of thoracic region: Secondary | ICD-10-CM | POA: Diagnosis not present

## 2024-07-08 DIAGNOSIS — M9905 Segmental and somatic dysfunction of pelvic region: Secondary | ICD-10-CM | POA: Diagnosis not present

## 2024-07-08 DIAGNOSIS — M9904 Segmental and somatic dysfunction of sacral region: Secondary | ICD-10-CM | POA: Diagnosis not present

## 2024-07-08 DIAGNOSIS — M419 Scoliosis, unspecified: Secondary | ICD-10-CM | POA: Diagnosis not present

## 2024-07-08 DIAGNOSIS — M9903 Segmental and somatic dysfunction of lumbar region: Secondary | ICD-10-CM | POA: Diagnosis not present

## 2024-07-08 DIAGNOSIS — M9901 Segmental and somatic dysfunction of cervical region: Secondary | ICD-10-CM | POA: Diagnosis not present

## 2024-07-13 DIAGNOSIS — M419 Scoliosis, unspecified: Secondary | ICD-10-CM | POA: Diagnosis not present

## 2024-07-13 DIAGNOSIS — M9905 Segmental and somatic dysfunction of pelvic region: Secondary | ICD-10-CM | POA: Diagnosis not present

## 2024-07-13 DIAGNOSIS — M9904 Segmental and somatic dysfunction of sacral region: Secondary | ICD-10-CM | POA: Diagnosis not present

## 2024-07-13 DIAGNOSIS — M9903 Segmental and somatic dysfunction of lumbar region: Secondary | ICD-10-CM | POA: Diagnosis not present

## 2024-07-13 DIAGNOSIS — M9902 Segmental and somatic dysfunction of thoracic region: Secondary | ICD-10-CM | POA: Diagnosis not present

## 2024-07-13 DIAGNOSIS — M9901 Segmental and somatic dysfunction of cervical region: Secondary | ICD-10-CM | POA: Diagnosis not present

## 2024-07-15 DIAGNOSIS — M9905 Segmental and somatic dysfunction of pelvic region: Secondary | ICD-10-CM | POA: Diagnosis not present

## 2024-07-15 DIAGNOSIS — M419 Scoliosis, unspecified: Secondary | ICD-10-CM | POA: Diagnosis not present

## 2024-07-15 DIAGNOSIS — M9902 Segmental and somatic dysfunction of thoracic region: Secondary | ICD-10-CM | POA: Diagnosis not present

## 2024-07-15 DIAGNOSIS — M9904 Segmental and somatic dysfunction of sacral region: Secondary | ICD-10-CM | POA: Diagnosis not present

## 2024-07-15 DIAGNOSIS — M9901 Segmental and somatic dysfunction of cervical region: Secondary | ICD-10-CM | POA: Diagnosis not present

## 2024-07-15 DIAGNOSIS — M9903 Segmental and somatic dysfunction of lumbar region: Secondary | ICD-10-CM | POA: Diagnosis not present

## 2024-07-19 DIAGNOSIS — M419 Scoliosis, unspecified: Secondary | ICD-10-CM | POA: Diagnosis not present

## 2024-07-19 DIAGNOSIS — M9904 Segmental and somatic dysfunction of sacral region: Secondary | ICD-10-CM | POA: Diagnosis not present

## 2024-07-19 DIAGNOSIS — M9903 Segmental and somatic dysfunction of lumbar region: Secondary | ICD-10-CM | POA: Diagnosis not present

## 2024-07-19 DIAGNOSIS — M9902 Segmental and somatic dysfunction of thoracic region: Secondary | ICD-10-CM | POA: Diagnosis not present

## 2024-07-19 DIAGNOSIS — M9901 Segmental and somatic dysfunction of cervical region: Secondary | ICD-10-CM | POA: Diagnosis not present

## 2024-07-19 DIAGNOSIS — M9905 Segmental and somatic dysfunction of pelvic region: Secondary | ICD-10-CM | POA: Diagnosis not present

## 2024-07-21 DIAGNOSIS — M9905 Segmental and somatic dysfunction of pelvic region: Secondary | ICD-10-CM | POA: Diagnosis not present

## 2024-07-21 DIAGNOSIS — M9903 Segmental and somatic dysfunction of lumbar region: Secondary | ICD-10-CM | POA: Diagnosis not present

## 2024-07-21 DIAGNOSIS — M9901 Segmental and somatic dysfunction of cervical region: Secondary | ICD-10-CM | POA: Diagnosis not present

## 2024-07-21 DIAGNOSIS — M9904 Segmental and somatic dysfunction of sacral region: Secondary | ICD-10-CM | POA: Diagnosis not present

## 2024-07-21 DIAGNOSIS — M419 Scoliosis, unspecified: Secondary | ICD-10-CM | POA: Diagnosis not present

## 2024-07-21 DIAGNOSIS — M9902 Segmental and somatic dysfunction of thoracic region: Secondary | ICD-10-CM | POA: Diagnosis not present

## 2024-07-27 DIAGNOSIS — M9903 Segmental and somatic dysfunction of lumbar region: Secondary | ICD-10-CM | POA: Diagnosis not present

## 2024-07-27 DIAGNOSIS — M9901 Segmental and somatic dysfunction of cervical region: Secondary | ICD-10-CM | POA: Diagnosis not present

## 2024-07-27 DIAGNOSIS — M419 Scoliosis, unspecified: Secondary | ICD-10-CM | POA: Diagnosis not present

## 2024-07-27 DIAGNOSIS — M9904 Segmental and somatic dysfunction of sacral region: Secondary | ICD-10-CM | POA: Diagnosis not present

## 2024-07-27 DIAGNOSIS — M9905 Segmental and somatic dysfunction of pelvic region: Secondary | ICD-10-CM | POA: Diagnosis not present

## 2024-07-27 DIAGNOSIS — M9902 Segmental and somatic dysfunction of thoracic region: Secondary | ICD-10-CM | POA: Diagnosis not present

## 2024-07-29 DIAGNOSIS — M9902 Segmental and somatic dysfunction of thoracic region: Secondary | ICD-10-CM | POA: Diagnosis not present

## 2024-07-29 DIAGNOSIS — M9905 Segmental and somatic dysfunction of pelvic region: Secondary | ICD-10-CM | POA: Diagnosis not present

## 2024-07-29 DIAGNOSIS — M9903 Segmental and somatic dysfunction of lumbar region: Secondary | ICD-10-CM | POA: Diagnosis not present

## 2024-07-29 DIAGNOSIS — M419 Scoliosis, unspecified: Secondary | ICD-10-CM | POA: Diagnosis not present

## 2024-07-29 DIAGNOSIS — M9904 Segmental and somatic dysfunction of sacral region: Secondary | ICD-10-CM | POA: Diagnosis not present

## 2024-07-29 DIAGNOSIS — M9901 Segmental and somatic dysfunction of cervical region: Secondary | ICD-10-CM | POA: Diagnosis not present
